# Patient Record
Sex: Male | Born: 1955 | Race: White | Hispanic: No | Smoking: Former smoker
Health system: Southern US, Community
[De-identification: ages and names within clinical notes are randomized; demographics above are authoritative.]

## PROBLEM LIST (undated history)

## (undated) DIAGNOSIS — I10 Essential (primary) hypertension: Secondary | ICD-10-CM

## (undated) DIAGNOSIS — C8290 Follicular lymphoma, unspecified, unspecified site: Secondary | ICD-10-CM

## (undated) DIAGNOSIS — C801 Malignant (primary) neoplasm, unspecified: Secondary | ICD-10-CM

## (undated) DIAGNOSIS — F32A Depression, unspecified: Secondary | ICD-10-CM

## (undated) DIAGNOSIS — F419 Anxiety disorder, unspecified: Secondary | ICD-10-CM

## (undated) DIAGNOSIS — U071 COVID-19: Secondary | ICD-10-CM

## (undated) DIAGNOSIS — M1990 Unspecified osteoarthritis, unspecified site: Secondary | ICD-10-CM

## (undated) HISTORY — DX: Follicular lymphoma, unspecified, unspecified site: C82.90

## (undated) HISTORY — PX: OTHER SURGICAL HISTORY: SHX169

---

## 2005-02-06 ENCOUNTER — Emergency Department (HOSPITAL_COMMUNITY): Admission: EM | Admit: 2005-02-06 | Discharge: 2005-02-07 | Payer: Self-pay | Admitting: Emergency Medicine

## 2006-03-27 ENCOUNTER — Ambulatory Visit: Payer: Self-pay | Admitting: Family Medicine

## 2006-10-21 ENCOUNTER — Ambulatory Visit: Payer: Self-pay | Admitting: Cardiology

## 2016-09-16 ENCOUNTER — Emergency Department (HOSPITAL_COMMUNITY)
Admission: EM | Admit: 2016-09-16 | Discharge: 2016-09-16 | Disposition: A | Discharge status: Home / Self Care | Payer: BLUE CROSS/BLUE SHIELD | Attending: Emergency Medicine | Admitting: Emergency Medicine

## 2016-09-16 ENCOUNTER — Encounter (HOSPITAL_COMMUNITY): Payer: Self-pay | Admitting: Emergency Medicine

## 2016-09-16 ENCOUNTER — Emergency Department (HOSPITAL_COMMUNITY): Payer: BLUE CROSS/BLUE SHIELD

## 2016-09-16 DIAGNOSIS — K429 Umbilical hernia without obstruction or gangrene: Secondary | ICD-10-CM | POA: Diagnosis not present

## 2016-09-16 DIAGNOSIS — I10 Essential (primary) hypertension: Secondary | ICD-10-CM | POA: Insufficient documentation

## 2016-09-16 DIAGNOSIS — R109 Unspecified abdominal pain: Secondary | ICD-10-CM | POA: Diagnosis present

## 2016-09-16 HISTORY — DX: Essential (primary) hypertension: I10

## 2016-09-16 LAB — BASIC METABOLIC PANEL
Anion gap: 8 (ref 5–15)
BUN: 15 mg/dL (ref 6–20)
CALCIUM: 9 mg/dL (ref 8.9–10.3)
CO2: 25 mmol/L (ref 22–32)
CREATININE: 1.21 mg/dL (ref 0.61–1.24)
Chloride: 103 mmol/L (ref 101–111)
GFR calc Af Amer: >60 mL/min (ref >60)
GFR calc non Af Amer: >60 mL/min (ref >60)
GLUCOSE: 125 mg/dL — AB (ref 65–99)
Potassium: 3.8 mmol/L (ref 3.5–5.1)
Sodium: 136 mmol/L (ref 135–145)

## 2016-09-16 LAB — URINALYSIS, ROUTINE W REFLEX MICROSCOPIC
BILIRUBIN URINE: NEGATIVE
Glucose, UA: NEGATIVE mg/dL
HGB URINE DIPSTICK: NEGATIVE
Leukocytes, UA: NEGATIVE
NITRITE: NEGATIVE
PH: 5 (ref 5.0–8.0)
Protein, ur: 30 mg/dL — AB
Specific Gravity, Urine: >1.03 — ABNORMAL HIGH (ref 1.005–1.030)

## 2016-09-16 LAB — URINE MICROSCOPIC-ADD ON
Squamous Epithelial / LPF: NONE SEEN
WBC UA: NONE SEEN WBC/hpf (ref 0–5)

## 2016-09-16 LAB — CBC WITH DIFFERENTIAL/PLATELET
BASOS PCT: 1 %
Basophils Absolute: 0.1 10*3/uL (ref 0.0–0.1)
EOS ABS: 0.6 10*3/uL (ref 0.0–0.7)
Eosinophils Relative: 6 %
HEMATOCRIT: 44.6 % (ref 39.0–52.0)
Hemoglobin: 14.8 g/dL (ref 13.0–17.0)
LYMPHS ABS: 3.7 10*3/uL (ref 0.7–4.0)
Lymphocytes Relative: 41 %
MCH: 30 pg (ref 26.0–34.0)
MCHC: 33.2 g/dL (ref 30.0–36.0)
MCV: 90.5 fL (ref 78.0–100.0)
MONO ABS: 0.8 10*3/uL (ref 0.1–1.0)
MONOS PCT: 9 %
Neutro Abs: 3.9 10*3/uL (ref 1.7–7.7)
Neutrophils Relative %: 43 %
Platelets: 255 10*3/uL (ref 150–400)
RBC: 4.93 MIL/uL (ref 4.22–5.81)
RDW: 15.4 % (ref 11.5–15.5)
WBC: 8.9 10*3/uL (ref 4.0–10.5)

## 2016-09-16 MED ORDER — ONDANSETRON HCL 4 MG/2ML IJ SOLN
4.0000 mg | Freq: Once | INTRAMUSCULAR | Status: AC
  Administered 2016-09-16: 4 mg via INTRAVENOUS

## 2016-09-16 MED ORDER — SODIUM CHLORIDE 0.9 % IV BOLUS (SEPSIS)
1000.0000 mL | Freq: Once | INTRAVENOUS | Status: AC
  Administered 2016-09-16: 1000 mL via INTRAVENOUS

## 2016-09-16 MED ORDER — ONDANSETRON HCL 4 MG/2ML IJ SOLN
INTRAMUSCULAR | Status: AC
  Filled 2016-09-16: qty 2

## 2016-09-16 MED ORDER — ONDANSETRON HCL 4 MG/2ML IJ SOLN
4.0000 mg | Freq: Once | INTRAMUSCULAR | Status: AC
  Administered 2016-09-16: 4 mg via INTRAVENOUS
  Filled 2016-09-16: qty 2

## 2016-09-16 MED ORDER — HYDROMORPHONE HCL 1 MG/ML IJ SOLN
1.0000 mg | Freq: Once | INTRAMUSCULAR | Status: AC
  Administered 2016-09-16: 1 mg via INTRAVENOUS
  Filled 2016-09-16: qty 1

## 2016-09-16 NOTE — ED Provider Notes (Signed)
[facility] DEPT Provider Note   CSN: [id number] Arrival date & time: 09/16/16  0425     History   Chief Complaint Chief Complaint  Patient presents with  . Abdominal Pain    HPI Joe [name] is a 60 y.o. male.  The history is provided by the patient.  Abdominal Pain   This is a new problem. The current episode started 1 to 2 hours ago. The problem occurs constantly. The problem has been gradually worsening. Pain location: umbilicus. The quality of the pain is sharp. The pain is severe. Associated symptoms include nausea. Pertinent negatives include fever and vomiting. The symptoms are aggravated by certain positions and palpation. Nothing relieves the symptoms.  Patient reports he has had an umbilical hernia for several yrs Tonight he had acute onset of severe pain in that area He has never had this type of pain before No fever/vomiting He had otherwise been well prior to this episode  Past Medical History:  Diagnosis Date  . Hypertension     There are no active problems to display for this patient.   Past Surgical History:  Procedure Laterality Date  . carpel tunnel         Home Medications    Prior to Admission medications   Not on File    Family History No family history on file.  Social History Social History  Substance Use Topics  . Smoking status: Never Smoker  . Smokeless tobacco: Never Used  . Alcohol use Yes     Comment: social drinker     Allergies   Patient has no known allergies.   Review of Systems Review of Systems  Constitutional: Negative for fever.  Gastrointestinal: Positive for abdominal pain and nausea. Negative for vomiting.  All other systems reviewed and are negative.    Physical Exam Updated Vital Signs BP (!) 168/102 (BP Location: Left Arm)   Pulse 93   Temp 98.4 F (36.9 C) (Oral)   Resp 18   Ht 5\' 8"  (1.727 m)   Wt (!) 140.6 kg   SpO2 97%   BMI 47.14 kg/m   Physical Exam  CONSTITUTIONAL: Well  developed/well nourished, uncomfortable appearing HEAD: Normocephalic/atraumatic EYES: EOMI ENMT: Mucous membranes moist NECK: supple no meningeal signs CV: S1/S2 noted, no murmurs/rubs/gallops noted LUNGS: Lungs are clear to auscultation bilaterally, no apparent distress ABDOMEN: soft, umbilical hernia noted, mild erythema noted, no duskiness noted, the hernia is tender to palpation, no rebound or guarding, bowel sounds noted throughout abdomen NEURO: Pt is awake/alert/appropriate, moves all extremitiesx4.  No facial droop.   EXTREMITIES: pulses normal/equal, full ROM SKIN: warm, color normal PSYCH: anxious  ED Treatments / Results  Labs (all labs ordered are listed, but only abnormal results are displayed) Labs Reviewed  BASIC METABOLIC PANEL - Abnormal; Notable for the following:       Result Value   Glucose, Bld 125 (*)    All other components within normal limits  URINALYSIS, ROUTINE W REFLEX MICROSCOPIC (NOT AT [facility]) - Abnormal; Notable for the following:    Specific Gravity, Urine >1.030 (*)    Ketones, ur TRACE (*)    Protein, ur 30 (*)    All other components within normal limits  URINE MICROSCOPIC-ADD ON - Abnormal; Notable for the following:    Bacteria, UA FEW (*)    All other components within normal limits  CBC WITH DIFFERENTIAL/PLATELET    EKG  EKG Interpretation None       Radiology Dg Abdomen Acute  W/chest  Result Date: 09/16/2016 CLINICAL DATA:  60 y/o M; umbilical hernia and lower abdominal pain. EXAM: DG ABDOMEN ACUTE W/ 1V CHEST COMPARISON:  None. FINDINGS: Low lung volumes accentuate pulmonary markings. No focal consolidation. No pneumothorax or effusion. Normal cardiac silhouette. No pneumoperitoneum. Normal bowel gas pattern. Small volume of stool in the colon. IMPRESSION: Negative abdominal radiographs.  No acute cardiopulmonary disease. Electronically Signed   By: Kristine Garbe M.D.   On: 09/16/2016 06:29    Procedures Procedures  (including critical care time)  Medications Ordered in ED Medications  HYDROmorphone (DILAUDID) injection 1 mg (1 mg Intravenous Given 09/16/16 0510)  ondansetron (ZOFRAN) injection 4 mg (4 mg Intravenous Given 09/16/16 0509)  sodium chloride 0.9 % bolus 1,000 mL (1,000 mLs Intravenous New Bag/Given 09/16/16 0508)  ondansetron (ZOFRAN) injection 4 mg (4 mg Intravenous Given 09/16/16 0651)     Initial Impression / Assessment and Plan / ED Course  I have reviewed the triage vital signs and the nursing notes.  Pertinent labs & imaging results that were available during my care of the patient were reviewed by me and considered in my medical decision making (see chart for details).  Clinical Course     4:52 AM Will treat pain, obtain xray and reassess  8:03 AM Pt feels much improved He has no abdominal tenderness He feels his hernia is at baseline I did attempt to reduce the hernia with some success and no focal tenderness, no significant skin color changes He has some mild nausea but no other symptoms He feels comfortable with d/c home I feel this is reasonable as imaging negative, labs reassuring Given referral to surgery We discussed strict return precautions  Final Clinical Impressions(s) / ED Diagnoses   Final diagnoses:  Umbilical hernia without obstruction and without gangrene    New Prescriptions New Prescriptions   No medications on file     Ripley Fraise, MD 09/16/16 [phone]

## 2016-09-16 NOTE — ED Notes (Signed)
Pt tolerating sips of po fluids. Reports he feels better and wants to go home.

## 2016-09-16 NOTE — ED Triage Notes (Signed)
Pt with umbilical hernia that c/o lower abdominal pain.

## 2016-09-16 NOTE — ED Notes (Signed)
Upon RN entering room to d/c pt, pt vomiting. EDP at bedside. Orders given.

## 2016-09-16 NOTE — ED Notes (Signed)
Dr Christy Gentles in to reassess

## 2016-09-16 NOTE — ED Notes (Signed)
Dr. Christy Gentles at bedside-if pt tolerates po okay to go home. If any n/v or pain RN to notify Dr. Reather Converse.

## 2016-10-31 DIAGNOSIS — K429 Umbilical hernia without obstruction or gangrene: Secondary | ICD-10-CM | POA: Diagnosis not present

## 2016-12-31 DIAGNOSIS — M109 Gout, unspecified: Secondary | ICD-10-CM | POA: Diagnosis not present

## 2016-12-31 DIAGNOSIS — Z6841 Body Mass Index (BMI) 40.0 and over, adult: Secondary | ICD-10-CM | POA: Diagnosis not present

## 2016-12-31 DIAGNOSIS — I10 Essential (primary) hypertension: Secondary | ICD-10-CM | POA: Diagnosis not present

## 2016-12-31 DIAGNOSIS — F331 Major depressive disorder, recurrent, moderate: Secondary | ICD-10-CM | POA: Diagnosis not present

## 2017-01-14 DIAGNOSIS — I10 Essential (primary) hypertension: Secondary | ICD-10-CM | POA: Diagnosis not present

## 2017-01-23 DIAGNOSIS — I10 Essential (primary) hypertension: Secondary | ICD-10-CM | POA: Diagnosis not present

## 2017-01-23 DIAGNOSIS — M109 Gout, unspecified: Secondary | ICD-10-CM | POA: Diagnosis not present

## 2017-01-23 DIAGNOSIS — E782 Mixed hyperlipidemia: Secondary | ICD-10-CM | POA: Diagnosis not present

## 2017-02-19 DIAGNOSIS — I10 Essential (primary) hypertension: Secondary | ICD-10-CM | POA: Diagnosis not present

## 2017-02-19 DIAGNOSIS — R531 Weakness: Secondary | ICD-10-CM | POA: Diagnosis not present

## 2017-02-19 DIAGNOSIS — R5383 Other fatigue: Secondary | ICD-10-CM | POA: Diagnosis not present

## 2017-03-11 DIAGNOSIS — I10 Essential (primary) hypertension: Secondary | ICD-10-CM | POA: Diagnosis not present

## 2017-04-07 DIAGNOSIS — I10 Essential (primary) hypertension: Secondary | ICD-10-CM | POA: Diagnosis not present

## 2017-04-07 DIAGNOSIS — Z6841 Body Mass Index (BMI) 40.0 and over, adult: Secondary | ICD-10-CM | POA: Diagnosis not present

## 2017-06-17 DIAGNOSIS — I10 Essential (primary) hypertension: Secondary | ICD-10-CM | POA: Diagnosis not present

## 2017-06-17 DIAGNOSIS — M109 Gout, unspecified: Secondary | ICD-10-CM | POA: Diagnosis not present

## 2017-06-17 DIAGNOSIS — F5101 Primary insomnia: Secondary | ICD-10-CM | POA: Diagnosis not present

## 2017-08-01 DIAGNOSIS — E782 Mixed hyperlipidemia: Secondary | ICD-10-CM | POA: Diagnosis not present

## 2017-08-01 DIAGNOSIS — I10 Essential (primary) hypertension: Secondary | ICD-10-CM | POA: Diagnosis not present

## 2017-08-12 DIAGNOSIS — R7301 Impaired fasting glucose: Secondary | ICD-10-CM | POA: Diagnosis not present

## 2017-09-09 DIAGNOSIS — D509 Iron deficiency anemia, unspecified: Secondary | ICD-10-CM | POA: Diagnosis not present

## 2017-09-09 DIAGNOSIS — R7301 Impaired fasting glucose: Secondary | ICD-10-CM | POA: Diagnosis not present

## 2017-10-18 DIAGNOSIS — H532 Diplopia: Secondary | ICD-10-CM | POA: Diagnosis not present

## 2018-03-20 DIAGNOSIS — Z87891 Personal history of nicotine dependence: Secondary | ICD-10-CM | POA: Diagnosis not present

## 2018-03-20 DIAGNOSIS — R05 Cough: Secondary | ICD-10-CM | POA: Diagnosis not present

## 2018-03-20 DIAGNOSIS — J45909 Unspecified asthma, uncomplicated: Secondary | ICD-10-CM | POA: Diagnosis not present

## 2018-03-20 DIAGNOSIS — H6691 Otitis media, unspecified, right ear: Secondary | ICD-10-CM | POA: Diagnosis not present

## 2018-03-20 DIAGNOSIS — Z79899 Other long term (current) drug therapy: Secondary | ICD-10-CM | POA: Diagnosis not present

## 2019-03-12 DIAGNOSIS — R7301 Impaired fasting glucose: Secondary | ICD-10-CM | POA: Diagnosis not present

## 2019-03-12 DIAGNOSIS — I10 Essential (primary) hypertension: Secondary | ICD-10-CM | POA: Diagnosis not present

## 2019-03-12 DIAGNOSIS — D509 Iron deficiency anemia, unspecified: Secondary | ICD-10-CM | POA: Diagnosis not present

## 2019-03-18 DIAGNOSIS — R7301 Impaired fasting glucose: Secondary | ICD-10-CM | POA: Diagnosis not present

## 2019-03-18 DIAGNOSIS — D649 Anemia, unspecified: Secondary | ICD-10-CM | POA: Diagnosis not present

## 2019-03-18 DIAGNOSIS — I10 Essential (primary) hypertension: Secondary | ICD-10-CM | POA: Diagnosis not present

## 2019-03-18 DIAGNOSIS — E785 Hyperlipidemia, unspecified: Secondary | ICD-10-CM | POA: Diagnosis not present

## 2019-04-19 DIAGNOSIS — F411 Generalized anxiety disorder: Secondary | ICD-10-CM | POA: Diagnosis not present

## 2019-04-19 DIAGNOSIS — F5101 Primary insomnia: Secondary | ICD-10-CM | POA: Diagnosis not present

## 2019-04-19 DIAGNOSIS — F331 Major depressive disorder, recurrent, moderate: Secondary | ICD-10-CM | POA: Diagnosis not present

## 2019-04-19 DIAGNOSIS — I10 Essential (primary) hypertension: Secondary | ICD-10-CM | POA: Diagnosis not present

## 2019-05-17 DIAGNOSIS — R05 Cough: Secondary | ICD-10-CM | POA: Diagnosis not present

## 2019-05-17 DIAGNOSIS — J40 Bronchitis, not specified as acute or chronic: Secondary | ICD-10-CM | POA: Diagnosis not present

## 2019-05-18 DIAGNOSIS — J069 Acute upper respiratory infection, unspecified: Secondary | ICD-10-CM | POA: Diagnosis not present

## 2019-05-31 DIAGNOSIS — I10 Essential (primary) hypertension: Secondary | ICD-10-CM | POA: Diagnosis not present

## 2019-05-31 DIAGNOSIS — F5101 Primary insomnia: Secondary | ICD-10-CM | POA: Diagnosis not present

## 2019-05-31 DIAGNOSIS — F411 Generalized anxiety disorder: Secondary | ICD-10-CM | POA: Diagnosis not present

## 2019-05-31 DIAGNOSIS — F331 Major depressive disorder, recurrent, moderate: Secondary | ICD-10-CM | POA: Diagnosis not present

## 2019-06-08 ENCOUNTER — Emergency Department (HOSPITAL_COMMUNITY)
Admission: EM | Admit: 2019-06-08 | Discharge: 2019-06-08 | Disposition: A | Discharge status: Home / Self Care | Payer: BC Managed Care – PPO | Attending: Emergency Medicine | Admitting: Emergency Medicine

## 2019-06-08 ENCOUNTER — Encounter (HOSPITAL_COMMUNITY): Payer: Self-pay

## 2019-06-08 ENCOUNTER — Emergency Department (HOSPITAL_COMMUNITY): Payer: BC Managed Care – PPO

## 2019-06-08 ENCOUNTER — Other Ambulatory Visit: Payer: Self-pay

## 2019-06-08 DIAGNOSIS — Z79899 Other long term (current) drug therapy: Secondary | ICD-10-CM | POA: Insufficient documentation

## 2019-06-08 DIAGNOSIS — I10 Essential (primary) hypertension: Secondary | ICD-10-CM | POA: Insufficient documentation

## 2019-06-08 DIAGNOSIS — R0602 Shortness of breath: Secondary | ICD-10-CM | POA: Diagnosis not present

## 2019-06-08 DIAGNOSIS — J069 Acute upper respiratory infection, unspecified: Secondary | ICD-10-CM | POA: Diagnosis not present

## 2019-06-08 DIAGNOSIS — Z20828 Contact with and (suspected) exposure to other viral communicable diseases: Secondary | ICD-10-CM | POA: Insufficient documentation

## 2019-06-08 DIAGNOSIS — E860 Dehydration: Secondary | ICD-10-CM | POA: Diagnosis not present

## 2019-06-08 DIAGNOSIS — R531 Weakness: Secondary | ICD-10-CM | POA: Diagnosis not present

## 2019-06-08 DIAGNOSIS — R000 Tachycardia, unspecified: Secondary | ICD-10-CM | POA: Diagnosis not present

## 2019-06-08 LAB — CBC
HCT: 42.9 % (ref 39.0–52.0)
Hemoglobin: 14.1 g/dL (ref 13.0–17.0)
MCH: 30.3 pg (ref 26.0–34.0)
MCHC: 32.9 g/dL (ref 30.0–36.0)
MCV: 92.1 fL (ref 80.0–100.0)
Platelets: 292 10*3/uL (ref 150–400)
RBC: 4.66 MIL/uL (ref 4.22–5.81)
RDW: 15.1 % (ref 11.5–15.5)
WBC: 9.7 10*3/uL (ref 4.0–10.5)
nRBC: 0 % (ref 0.0–0.2)

## 2019-06-08 LAB — BASIC METABOLIC PANEL
Anion gap: 10 (ref 5–15)
BUN: 20 mg/dL (ref 6–20)
CO2: 26 mmol/L (ref 22–32)
Calcium: 9 mg/dL (ref 8.9–10.3)
Chloride: 101 mmol/L (ref 98–111)
Creatinine, Ser: 1.58 mg/dL — ABNORMAL HIGH (ref 0.61–1.24)
GFR calc Af Amer: 54 mL/min — ABNORMAL LOW (ref >60)
GFR calc non Af Amer: 47 mL/min — ABNORMAL LOW (ref >60)
Glucose, Bld: 109 mg/dL — ABNORMAL HIGH (ref 70–99)
Potassium: 4.2 mmol/L (ref 3.5–5.1)
Sodium: 137 mmol/L (ref 135–145)

## 2019-06-08 LAB — D-DIMER, QUANTITATIVE: D-Dimer, Quant: 0.51 ug/mL-FEU — ABNORMAL HIGH (ref 0.00–0.50)

## 2019-06-08 MED ORDER — SODIUM CHLORIDE 0.9 % IV BOLUS
500.0000 mL | Freq: Once | INTRAVENOUS | Status: AC
  Administered 2019-06-08: 500 mL via INTRAVENOUS

## 2019-06-08 MED ORDER — PREDNISONE 20 MG PO TABS: 40.0000 mg | ORAL_TABLET | Freq: Every day | ORAL | 0 refills | Status: DC

## 2019-06-08 NOTE — ED Provider Notes (Signed)
[facility] EMERGENCY DEPARTMENT Provider Note   CSN: [id number] Arrival date & time: 06/08/19  0867    History   Chief Complaint Chief Complaint  Patient presents with  . Shortness of Breath    HPI Joe [name] is a 63 y.o. male.     HPI Patient presents with shortness of breath and cough.  Has had for last 3 weeks.  Has been seen previously and had a negative COVID test.  Had been on azithromycin and steroids.  States he felt better briefly but now feeling worse.  More fatigued.  More lightheaded.  No chest pain.  Initially with sputum production but states he no longer has.  Former smoker but states he quit 32 years ago.  No chest pain.  No swelling in his legs. Past Medical History:  Diagnosis Date  . Hypertension     There are no active problems to display for this patient.   Past Surgical History:  Procedure Laterality Date  . carpel tunnel          Home Medications    Prior to Admission medications   Medication Sig Start Date End Date Taking? Authorizing Provider  albuterol (VENTOLIN HFA) 108 (90 Base) MCG/ACT inhaler Inhale 2 puffs into the lungs every 6 (six) hours as needed for wheezing or shortness of breath.  05/17/19 05/16/20 Yes [provider]  buPROPion (WELLBUTRIN XL) 150 MG 24 hr tablet Take 150 mg by mouth 2 (two) times daily.   Yes [provider]  LORazepam (ATIVAN) 0.5 MG tablet Take 0.5 mg by mouth daily as needed for anxiety or sleep.   Yes [provider]  naproxen sodium (ALEVE) 220 MG tablet Take 440 mg by mouth daily as needed (pain).   Yes [provider]  olmesartan-hydrochlorothiazide (BENICAR HCT) 40-12.5 MG tablet Take 1 tablet by mouth daily.   Yes [provider]  sertraline (ZOLOFT) 50 MG tablet Take 50 mg by mouth daily.   Yes [provider]  predniSONE (DELTASONE) 20 MG tablet Take 2 tablets (40 mg total) by mouth daily. 06/08/19   Davonna Belling, MD    Family History No  family history on file.  Social History Social History   Tobacco Use  . Smoking status: Never Smoker  . Smokeless tobacco: Never Used  Substance Use Topics  . Alcohol use: Yes    Comment: social drinker  . Drug use: No     Allergies   Patient has no known allergies.   Review of Systems Review of Systems  Constitutional: Negative for appetite change.  HENT: Negative for congestion.   Respiratory: Positive for cough and shortness of breath. Negative for wheezing.   Cardiovascular: Negative for chest pain.  Gastrointestinal: Negative for abdominal pain.  Genitourinary: Negative for flank pain.  Musculoskeletal: Negative for back pain.  Skin: Negative for rash.  Neurological: Positive for light-headedness. Negative for weakness.  Psychiatric/Behavioral: Negative for confusion.     Physical Exam Updated Vital Signs BP 107/67   Pulse 90   Temp 98.4 F (36.9 C)   Resp 20   Ht 5\' 8"  (1.727 m)   Wt (!) 155.1 kg   SpO2 97%   BMI 52.00 kg/m   Physical Exam Vitals signs and nursing note reviewed.  HENT:     Head: Normocephalic.  Neck:     Musculoskeletal: Neck supple.  Cardiovascular:     Rate and Rhythm: Tachycardia present.  Pulmonary:     Breath sounds: No decreased breath  sounds, wheezing or rales.  Chest:     Chest wall: No tenderness.  Musculoskeletal:     Right lower leg: No edema.     Left lower leg: No edema.  Skin:    General: Skin is warm.     Capillary Refill: Capillary refill takes less than 2 seconds.  Neurological:     Mental Status: He is alert and oriented to person, place, and time.      ED Treatments / Results  Labs (all labs ordered are listed, but only abnormal results are displayed) Labs Reviewed  BASIC METABOLIC PANEL - Abnormal; Notable for the following components:      Result Value   Glucose, Bld 109 (*)    Creatinine, Ser 1.58 (*)    GFR calc non Af Amer 47 (*)    GFR calc Af Amer 54 (*)    All other components within  normal limits  D-DIMER, QUANTITATIVE (NOT AT [facility]) - Abnormal; Notable for the following components:   D-Dimer, Quant 0.51 (*)    All other components within normal limits  NOVEL CORONAVIRUS, NAA (HOSPITAL ORDER, SEND-OUT TO REF LAB)  CBC    EKG EKG Interpretation  Date/Time:  Tuesday June 08 2019 10:50:49 EDT Ventricular Rate:  97 PR Interval:    QRS Duration: 91 QT Interval:  325 QTC Calculation: 413 R Axis:   -93 Text Interpretation:  Sinus rhythm Left anterior fascicular block Consider right ventricular hypertrophy Confirmed by Davonna Belling [phone]) on 06/08/2019 11:31:09 AM   Radiology Dg Chest Portable 1 View  Result Date: 06/08/2019 CLINICAL DATA:  Shortness of breath EXAM: PORTABLE CHEST 1 VIEW COMPARISON:  03/20/2018 FINDINGS: Cardiac shadow is stable. The lungs are clear bilaterally. The lateral most aspect of the right lung is coned off the film. No bony abnormality is noted. IMPRESSION: Somewhat limited exam as described.  No acute abnormality noted. Electronically Signed   By: Inez Catalina M.D.   On: 06/08/2019 10:41    Procedures Procedures (including critical care time)  Medications Ordered in ED Medications  sodium chloride 0.9 % bolus 500 mL (0 mLs Intravenous Stopped 06/08/19 1145)     Initial Impression / Assessment and Plan / ED Course  I have reviewed the triage vital signs and the nursing notes.  Pertinent labs & imaging results that were available during my care of the patient were reviewed by me and considered in my medical decision making (see chart for details).        Patient with URI symptoms.  Feels a lot better after IV fluids.  Will check outpatient covid test.  Has been feeling better with steroids will give another short course.  Creatinine mildly increased.  Will need outpatient follow-up.  Discharge home.  Negative d-dimer.  Final Clinical Impressions(s) / ED Diagnoses   Final diagnoses:  Upper respiratory tract infection,  unspecified type  Dehydration    ED Discharge Orders         Ordered    predniSONE (DELTASONE) 20 MG tablet  Daily     06/08/19 1155           Davonna Belling, MD 06/08/19 1348

## 2019-06-08 NOTE — Discharge Instructions (Addendum)
You have had another COVID test done.  The results should come back in 1 to 2 days.  You will be notified if they are positive we can look at the results in my chart also.

## 2019-06-08 NOTE — ED Triage Notes (Signed)
Pt reports was diagnosed with bronchitis a few weeks ago.  Today pt went to pcp because for the past 2 weeks has had fatigue and sob.  Denies fever.

## 2019-06-09 LAB — NOVEL CORONAVIRUS, NAA (HOSP ORDER, SEND-OUT TO REF LAB; TAT 18-24 HRS): SARS-CoV-2, NAA: NOT DETECTED

## 2020-02-04 DIAGNOSIS — M545 Low back pain: Secondary | ICD-10-CM | POA: Diagnosis not present

## 2020-03-14 ENCOUNTER — Ambulatory Visit (HOSPITAL_COMMUNITY)
Admission: RE | Admit: 2020-03-14 | Discharge: 2020-03-14 | Disposition: A | Discharge status: Home / Self Care | Payer: BC Managed Care – PPO | Source: Ambulatory Visit | Attending: Internal Medicine | Admitting: Internal Medicine

## 2020-03-14 ENCOUNTER — Other Ambulatory Visit: Payer: Self-pay

## 2020-03-14 ENCOUNTER — Other Ambulatory Visit (HOSPITAL_COMMUNITY): Payer: Self-pay | Admitting: Internal Medicine

## 2020-03-14 DIAGNOSIS — W19XXXA Unspecified fall, initial encounter: Secondary | ICD-10-CM

## 2020-03-14 DIAGNOSIS — R0782 Intercostal pain: Secondary | ICD-10-CM | POA: Diagnosis not present

## 2020-03-14 DIAGNOSIS — R0781 Pleurodynia: Secondary | ICD-10-CM

## 2020-03-14 DIAGNOSIS — S2241XA Multiple fractures of ribs, right side, initial encounter for closed fracture: Secondary | ICD-10-CM | POA: Insufficient documentation

## 2020-03-14 DIAGNOSIS — R0789 Other chest pain: Secondary | ICD-10-CM | POA: Diagnosis not present

## 2020-05-09 DIAGNOSIS — M25561 Pain in right knee: Secondary | ICD-10-CM | POA: Diagnosis not present

## 2020-05-23 ENCOUNTER — Other Ambulatory Visit: Payer: Self-pay

## 2020-05-23 ENCOUNTER — Ambulatory Visit: Payer: BC Managed Care – PPO | Admitting: Orthopaedic Surgery

## 2020-05-23 ENCOUNTER — Encounter: Payer: Self-pay | Admitting: Orthopaedic Surgery

## 2020-05-23 ENCOUNTER — Ambulatory Visit: Payer: BC Managed Care – PPO

## 2020-05-23 VITALS — BP 165/107 | HR 115 | Ht 69.0 in | Wt 336.0 lb

## 2020-05-23 DIAGNOSIS — M25561 Pain in right knee: Secondary | ICD-10-CM

## 2020-05-23 DIAGNOSIS — M1A0610 Idiopathic chronic gout, right knee, without tophus (tophi): Secondary | ICD-10-CM | POA: Diagnosis not present

## 2020-05-23 DIAGNOSIS — Z6841 Body Mass Index (BMI) 40.0 and over, adult: Secondary | ICD-10-CM | POA: Diagnosis not present

## 2020-05-23 DIAGNOSIS — G8929 Other chronic pain: Secondary | ICD-10-CM

## 2020-05-23 MED ORDER — ALLOPURINOL 300 MG PO TABS
300.0000 mg | ORAL_TABLET | Freq: Every day | ORAL | 5 refills | Status: DC
Start: 2020-05-23 — End: 2021-01-15

## 2020-05-23 MED ORDER — NAPROXEN 500 MG PO TABS: 500.0000 mg | ORAL_TABLET | Freq: Two times a day (BID) | ORAL | 5 refills | Status: DC

## 2020-05-23 NOTE — Progress Notes (Signed)
Subjective:    Patient ID: Joe [name], male    DOB: 18-Mar-1956, 64 y.o.   MRN: [id number]  HPI He has had pain in the knees, more on the right, for many months getting worse recently.  He has been seen by Dr. Wende Neighbors on 05-12-2020.  I have reviewed the notes.  He was given prednisone dose pack and it helped for a week.  His pain has returned.  He has history of gout and is not on any medicine for that.  He has swelling of the right knee, giving way, popping but no redness, no trauma, no numbness.  He has tried ice, heat and Tylenol also.   Review of Systems  Constitutional: Positive for activity change.  Musculoskeletal: Positive for arthralgias, gait problem and joint swelling.  All other systems reviewed and are negative.  For Review of Systems, all other systems reviewed and are negative.  The following is a summary of the past history medically, past history surgically, known current medicines, social history and family history.  This information is gathered electronically by the computer from prior information and documentation.  I review this each visit and have found including this information at this point in the chart is beneficial and informative.   Past Medical History:  Diagnosis Date  . Hypertension     Past Surgical History:  Procedure Laterality Date  . carpel tunnel      Current Outpatient Medications on File Prior to Visit  Medication Sig Dispense Refill  . amLODipine (NORVASC) 10 MG tablet Take 10 mg by mouth daily.    [name] buPROPion (WELLBUTRIN XL) 150 MG 24 hr tablet Take 150 mg by mouth 2 (two) times daily.    [name] LORazepam (ATIVAN) 0.5 MG tablet Take 0.5 mg by mouth daily as needed for anxiety or sleep.    [name] losartan (COZAAR) 100 MG tablet Take 100 mg by mouth daily.    . naproxen sodium (ALEVE) 220 MG tablet Take 440 mg by mouth daily as needed (pain).    [name] olmesartan-hydrochlorothiazide (BENICAR HCT) 40-12.5 MG tablet Take 1 tablet by mouth daily.      . predniSONE (DELTASONE) 20 MG tablet Take 2 tablets (40 mg total) by mouth daily. 8 tablet 0  . sertraline (ZOLOFT) 50 MG tablet Take 50 mg by mouth daily.    [name] albuterol (VENTOLIN HFA) 108 (90 Base) MCG/ACT inhaler Inhale 2 puffs into the lungs every 6 (six) hours as needed for wheezing or shortness of breath.      No current facility-administered medications on file prior to visit.    Social History   Socioeconomic History  . Marital status: Widowed    Spouse name: Not on file  . Number of children: Not on file  . Years of education: Not on file  . Highest education level: Not on file  Occupational History  . Not on file  Tobacco Use  . Smoking status: Never Smoker  . Smokeless tobacco: Never Used  Substance and Sexual Activity  . Alcohol use: Yes    Comment: social drinker  . Drug use: No  . Sexual activity: Not on file  Other Topics Concern  . Not on file  Social History Narrative  . Not on file   Social Determinants of Health   Financial Resource Strain:   . Difficulty of Paying Living Expenses:   Food Insecurity:   . Worried About Charity fundraiser in the Last Year:   . Ran  Out of Food in the Last Year:   Transportation Needs:   . Lack of Transportation (Medical):   [name] Lack of Transportation (Non-Medical):   Physical Activity:   . Days of Exercise per Week:   . Minutes of Exercise per Session:   Stress:   . Feeling of Stress :   Social Connections:   . Frequency of Communication with Friends and Family:   . Frequency of Social Gatherings with Friends and Family:   . Attends Religious Services:   . Active Member of Clubs or Organizations:   . Attends Archivist Meetings:   [name] Marital Status:   Intimate Partner Violence:   . Fear of Current or Ex-Partner:   . Emotionally Abused:   [name] Physically Abused:   . Sexually Abused:     Family History  Problem Relation Age of Onset  . Healthy Mother   . Healthy Father     BP (!) 165/107   Pulse  (!) 115   Ht 5\' 9"  (1.753 m)   Wt (!) 336 lb (152.4 kg)   BMI 49.62 kg/m   Body mass index is 49.62 kg/m.      Objective:   Physical Exam Vitals and nursing note reviewed.  Constitutional:      Appearance: He is well-developed.  HENT:     Head: Normocephalic and atraumatic.  Eyes:     Conjunctiva/sclera: Conjunctivae normal.     Pupils: Pupils are equal, round, and reactive to light.  Cardiovascular:     Rate and Rhythm: Normal rate and regular rhythm.  Pulmonary:     Effort: Pulmonary effort is normal.  Abdominal:     Palpations: Abdomen is soft.  Musculoskeletal:     Cervical back: Normal range of motion and neck supple.       Legs:  Skin:    General: Skin is warm and dry.  Neurological:     Mental Status: He is alert and oriented to person, place, and time.     Cranial Nerves: No cranial nerve deficit.     Motor: No abnormal muscle tone.     Coordination: Coordination normal.     Deep Tendon Reflexes: Reflexes are normal and symmetric. Reflexes normal.  Psychiatric:        Behavior: Behavior normal.        Thought Content: Thought content normal.        Judgment: Judgment normal.    X-rays were done of the right knee, reported separately.       Assessment & Plan:   Encounter Diagnoses  Name Primary?  . Chronic pain of right knee Yes  . Idiopathic chronic gout of right knee without tophus   . Body mass index 45.0-49.9, adult ([place])   . Morbid obesity ([place])    PROCEDURE NOTE:  The patient requests injections of the right knee , verbal consent was obtained.  The right knee was prepped appropriately after time out was performed.   Sterile technique was observed and injection of 1 cc of Depo-Medrol 40 mg with several cc's of plain xylocaine. Anesthesia was provided by ethyl chloride and a 20-gauge needle was used to inject the knee area. The injection was tolerated well.  A band aid dressing was applied.  The patient was advised to apply ice later today  and tomorrow to the injection sight as needed.  I am concerned about medial meniscus tear.  I have given exercises for him to do.  I will see him back  in two weeks.  He may need MRI.  Call if any problem.  Precautions discussed.   Electronically Signed Sanjuana Kava, MD 8/3/202111:05 AM

## 2020-05-23 NOTE — Patient Instructions (Signed)
Journal for Nurse Practitioners, 15(4), 263-267. Retrieved July 27, 2018 from [email or web address].[email or web address]">  Knee Exercises Ask your health care provider which exercises are safe for you. Do exercises exactly as told by your health care provider and adjust them as directed. It is normal to feel mild stretching, pulling, tightness, or discomfort as you do these exercises. Stop right away if you feel sudden pain or your pain gets worse. Do not begin these exercises until told by your health care provider. Stretching and range-of-motion exercises These exercises warm up your muscles and joints and improve the movement and flexibility of your knee. These exercises also help to relieve pain and swelling. Knee extension, prone 1. Lie on your abdomen (prone position) on a bed. 2. Place your left / right knee just beyond the edge of the surface so your knee is not on the bed. You can put a towel under your left / right thigh just above your kneecap for comfort. 3. Relax your leg muscles and allow gravity to straighten your knee (extension). You should feel a stretch behind your left / right knee. 4. Hold this position for __________ seconds. 5. Scoot up so your knee is supported between repetitions. Repeat __________ times. Complete this exercise __________ times a day. Knee flexion, active  1. Lie on your back with both legs straight. If this causes back discomfort, bend your left / right knee so your foot is flat on the floor. 2. Slowly slide your left / right heel back toward your buttocks. Stop when you feel a gentle stretch in the front of your knee or thigh (flexion). 3. Hold this position for __________ seconds. 4. Slowly slide your left / right heel back to the starting position. Repeat __________ times. Complete this exercise __________ times a day. Quadriceps stretch, prone  1. Lie on your abdomen on a firm surface, such as a bed or padded floor. 2. Bend your left / right knee and hold  your ankle. If you cannot reach your ankle or pant leg, loop a belt around your foot and grab the belt instead. 3. Gently pull your heel toward your buttocks. Your knee should not slide out to the side. You should feel a stretch in the front of your thigh and knee (quadriceps). 4. Hold this position for __________ seconds. Repeat __________ times. Complete this exercise __________ times a day. Hamstring, supine 1. Lie on your back (supine position). 2. Loop a belt or towel over the ball of your left / right foot. The ball of your foot is on the walking surface, right under your toes. 3. Straighten your left / right knee and slowly pull on the belt to raise your leg until you feel a gentle stretch behind your knee (hamstring). ? Do not let your knee bend while you do this. ? Keep your other leg flat on the floor. 4. Hold this position for __________ seconds. Repeat __________ times. Complete this exercise __________ times a day. Strengthening exercises These exercises build strength and endurance in your knee. Endurance is the ability to use your muscles for a long time, even after they get tired. Quadriceps, isometric This exercise stretches the muscles in front of your thigh (quadriceps) without moving your knee joint (isometric). 1. Lie on your back with your left / right leg extended and your other knee bent. Put a rolled towel or small pillow under your knee if told by your health care provider. 2. Slowly tense the muscles in the front of your left /   right thigh. You should see your kneecap slide up toward your hip or see increased dimpling just above the knee. This motion will push the back of the knee toward the floor. 3. For __________ seconds, hold the muscle as tight as you can without increasing your pain. 4. Relax the muscles slowly and completely. Repeat __________ times. Complete this exercise __________ times a day. Straight leg raises This exercise stretches the muscles in front  of your thigh (quadriceps) and the muscles that move your hips (hip flexors). 1. Lie on your back with your left / right leg extended and your other knee bent. 2. Tense the muscles in the front of your left / right thigh. You should see your kneecap slide up or see increased dimpling just above the knee. Your thigh may even shake a bit. 3. Keep these muscles tight as you raise your leg 4-6 inches (10-15 cm) off the floor. Do not let your knee bend. 4. Hold this position for __________ seconds. 5. Keep these muscles tense as you lower your leg. 6. Relax your muscles slowly and completely after each repetition. Repeat __________ times. Complete this exercise __________ times a day. Hamstring, isometric 1. Lie on your back on a firm surface. 2. Bend your left / right knee about __________ degrees. 3. Dig your left / right heel into the surface as if you are trying to pull it toward your buttocks. Tighten the muscles in the back of your thighs (hamstring) to "dig" as hard as you can without increasing any pain. 4. Hold this position for __________ seconds. 5. Release the tension gradually and allow your muscles to relax completely for __________ seconds after each repetition. Repeat __________ times. Complete this exercise __________ times a day. Hamstring curls If told by your health care provider, do this exercise while wearing ankle weights. Begin with __________ lb weights. Then increase the weight by 1 lb (0.5 kg) increments. Do not wear ankle weights that are more than __________ lb. 1. Lie on your abdomen with your legs straight. 2. Bend your left / right knee as far as you can without feeling pain. Keep your hips flat against the floor. 3. Hold this position for __________ seconds. 4. Slowly lower your leg to the starting position. Repeat __________ times. Complete this exercise __________ times a day. Squats This exercise strengthens the muscles in front of your thigh and knee  (quadriceps). 1. Stand in front of a table, with your feet and knees pointing straight ahead. You may rest your hands on the table for balance but not for support. 2. Slowly bend your knees and lower your hips like you are going to sit in a chair. ? Keep your weight over your heels, not over your toes. ? Keep your lower legs upright so they are parallel with the table legs. ? Do not let your hips go lower than your knees. ? Do not bend lower than told by your health care provider. ? If your knee pain increases, do not bend as low. 3. Hold the squat position for __________ seconds. 4. Slowly push with your legs to return to standing. Do not use your hands to pull yourself to standing. Repeat __________ times. Complete this exercise __________ times a day. Wall slides This exercise strengthens the muscles in front of your thigh and knee (quadriceps). 1. Lean your back against a smooth wall or door, and walk your feet out 18-24 inches (46-61 cm) from it. 2. Place your feet hip-width apart. 3.   Slowly slide down the wall or door until your knees bend __________ degrees. Keep your knees over your heels, not over your toes. Keep your knees in line with your hips. 4. Hold this position for __________ seconds. Repeat __________ times. Complete this exercise __________ times a day. Straight leg raises This exercise strengthens the muscles that rotate the leg at the hip and move it away from your body (hip abductors). 1. Lie on your side with your left / right leg in the top position. Lie so your head, shoulder, knee, and hip line up. You may bend your bottom knee to help you keep your balance. 2. Roll your hips slightly forward so your hips are stacked directly over each other and your left / right knee is facing forward. 3. Leading with your heel, lift your top leg 4-6 inches (10-15 cm). You should feel the muscles in your outer hip lifting. ? Do not let your foot drift forward. ? Do not let your knee  roll toward the ceiling. 4. Hold this position for __________ seconds. 5. Slowly return your leg to the starting position. 6. Let your muscles relax completely after each repetition. Repeat __________ times. Complete this exercise __________ times a day. Straight leg raises This exercise stretches the muscles that move your hips away from the front of the pelvis (hip extensors). 1. Lie on your abdomen on a firm surface. You can put a pillow under your hips if that is more comfortable. 2. Tense the muscles in your buttocks and lift your left / right leg about 4-6 inches (10-15 cm). Keep your knee straight as you lift your leg. 3. Hold this position for __________ seconds. 4. Slowly lower your leg to the starting position. 5. Let your leg relax completely after each repetition. Repeat __________ times. Complete this exercise __________ times a day. This information is not intended to replace advice given to you by your health care provider. Make sure you discuss any questions you have with your health care provider. Document Revised: 07/28/2018 Document Reviewed: 07/28/2018 Elsevier Patient Education  2020 [employer]  

## 2020-06-08 ENCOUNTER — Encounter: Payer: Self-pay | Admitting: Orthopaedic Surgery

## 2020-06-08 ENCOUNTER — Other Ambulatory Visit: Payer: Self-pay

## 2020-06-08 ENCOUNTER — Ambulatory Visit: Payer: BC Managed Care – PPO | Admitting: Orthopaedic Surgery

## 2020-06-08 VITALS — BP 175/107 | HR 100 | Ht 69.0 in | Wt 351.0 lb

## 2020-06-08 DIAGNOSIS — Z6841 Body Mass Index (BMI) 40.0 and over, adult: Secondary | ICD-10-CM

## 2020-06-08 DIAGNOSIS — G8929 Other chronic pain: Secondary | ICD-10-CM

## 2020-06-08 DIAGNOSIS — M25561 Pain in right knee: Secondary | ICD-10-CM

## 2020-06-08 NOTE — Patient Instructions (Signed)
Out of work 

## 2020-06-08 NOTE — Progress Notes (Signed)
[name], male DOB:1956/06/19, 64 y.o. LOV:[id number]  Chief Complaint  Patient presents with  . Knee Pain    R/injection helped some    HPI  Joe [name] is a 64 y.o. male who has increasing pain of the right knee.  The injection helped a little.  He is doing the exercises given but they make it worse.  He has medial pain, giving way and positive medial McMurray.  I am concerned he has medial meniscus tear and may need arthroscopy.  The naprosyn has helped slightly.  He is using a cane to avoid falling.  Body mass index is 51.83 kg/m.  The patient meets the AMA guidelines for Morbid (severe) obesity with a BMI > 40.0 and I have recommended weight loss.   ROS  Review of Systems  Constitutional: Positive for activity change.  Musculoskeletal: Positive for arthralgias, gait problem and joint swelling.  All other systems reviewed and are negative.   All other systems reviewed and are negative.  The following is a summary of the past history medically, past history surgically, known current medicines, social history and family history.  This information is gathered electronically by the computer from prior information and documentation.  I review this each visit and have found including this information at this point in the chart is beneficial and informative.    Past Medical History:  Diagnosis Date  . Hypertension     Past Surgical History:  Procedure Laterality Date  . carpel tunnel      Family History  Problem Relation Age of Onset  . Healthy Mother   . Healthy Father     Social History Social History   Tobacco Use  . Smoking status: Never Smoker  . Smokeless tobacco: Never Used  Substance Use Topics  . Alcohol use: Yes    Comment: social drinker  . Drug use: No    No Known Allergies  Current Outpatient Medications  Medication Sig Dispense Refill  . allopurinol (ZYLOPRIM) 300 MG tablet Take 1 tablet (300 mg total) by mouth daily. 30  tablet 5  . amLODipine (NORVASC) 10 MG tablet Take 10 mg by mouth daily.    [name] buPROPion (WELLBUTRIN XL) 150 MG 24 hr tablet Take 150 mg by mouth 2 (two) times daily.    [name] LORazepam (ATIVAN) 0.5 MG tablet Take 0.5 mg by mouth daily as needed for anxiety or sleep.    [name] losartan (COZAAR) 100 MG tablet Take 100 mg by mouth daily.    . naproxen (NAPROSYN) 500 MG tablet Take 1 tablet (500 mg total) by mouth 2 (two) times daily with a meal. 60 tablet 5  . naproxen sodium (ALEVE) 220 MG tablet Take 440 mg by mouth daily as needed (pain).    [name] olmesartan-hydrochlorothiazide (BENICAR HCT) 40-12.5 MG tablet Take 1 tablet by mouth daily.    . predniSONE (DELTASONE) 20 MG tablet Take 2 tablets (40 mg total) by mouth daily. 8 tablet 0  . sertraline (ZOLOFT) 50 MG tablet Take 50 mg by mouth daily.    [name] albuterol (VENTOLIN HFA) 108 (90 Base) MCG/ACT inhaler Inhale 2 puffs into the lungs every 6 (six) hours as needed for wheezing or shortness of breath.      No current facility-administered medications for this visit.     Physical Exam  Blood pressure (!) 175/107, pulse 100, height 5\' 9"  (1.753 m), weight (!) 351 lb (159.2 kg).  Constitutional: overall normal hygiene, normal nutrition, well developed, normal grooming,  normal body habitus. Assistive device:cane  Musculoskeletal: gait and station Limp right, muscle tone and strength are normal, no tremors or atrophy is present.  .  Neurological: coordination overall normal.  Deep tendon reflex/nerve stretch intact.  Sensation normal.  Cranial nerves II-XII intact.   Skin:   Normal overall no scars, lesions, ulcers or rashes. No psoriasis.  Psychiatric: Alert and oriented x 3.  Recent memory intact, remote memory unclear.  Normal mood and affect. Well groomed.  Good eye contact.  Cardiovascular: overall no swelling, no varicosities, no edema bilaterally, normal temperatures of the legs and arms, no clubbing, cyanosis and good capillary  refill.  Lymphatic: palpation is normal.  Right knee painful, ROM 0 to 100, crepitus, effusion, positive medial McMurray, limp right, uses cane, NV intact.   All other systems reviewed and are negative   The patient has been educated about the nature of the problem(s) and counseled on treatment options.  The patient appeared to understand what I have discussed and is in agreement with it.  Encounter Diagnoses  Name Primary?  . Chronic pain of right knee Yes  . Body mass index 45.0-49.9, adult ([place])   . Morbid obesity ([place])     PLAN Call if any problems.  Precautions discussed.  Continue current medications.   Return to clinic 2 weeks   Get MRI of the right knee.  Electronically Signed Sanjuana Kava, MD 8/19/20218:37 AM

## 2020-06-15 ENCOUNTER — Telehealth: Payer: Self-pay | Admitting: Orthopaedic Surgery

## 2020-06-15 NOTE — Telephone Encounter (Signed)
I called the patient to reschedule his appointment for the MRI follow up that was on 06/22/2020 since the MRI is not until 07/08/2020. It has been re-scheduled for a later date past the MRI date.   Patient wants to know if you are willing to extend the note to be out of work until he has the MRI. This is scheduled for 07/08/2020. Please advise.

## 2020-06-19 NOTE — Telephone Encounter (Signed)
Yes, extend out of work note.

## 2020-06-20 ENCOUNTER — Encounter: Payer: Self-pay | Admitting: Orthopaedic Surgery

## 2020-06-20 NOTE — Telephone Encounter (Signed)
I called the patient and he voices understanding. He is going to bring by paperwork as well for Dr. Luna Glasgow. No other concerns.

## 2020-06-22 ENCOUNTER — Ambulatory Visit: Payer: BC Managed Care – PPO | Admitting: Orthopaedic Surgery

## 2020-07-08 ENCOUNTER — Ambulatory Visit
Admission: RE | Admit: 2020-07-08 | Discharge: 2020-07-08 | Disposition: A | Discharge status: Home / Self Care | Payer: BC Managed Care – PPO | Source: Ambulatory Visit | Attending: Orthopaedic Surgery | Admitting: Orthopaedic Surgery

## 2020-07-08 ENCOUNTER — Other Ambulatory Visit: Payer: Self-pay

## 2020-07-08 DIAGNOSIS — M25561 Pain in right knee: Secondary | ICD-10-CM

## 2020-07-08 DIAGNOSIS — G8929 Other chronic pain: Secondary | ICD-10-CM

## 2020-07-11 ENCOUNTER — Other Ambulatory Visit: Payer: Self-pay

## 2020-07-11 ENCOUNTER — Ambulatory Visit: Payer: BC Managed Care – PPO | Admitting: Orthopaedic Surgery

## 2020-07-11 ENCOUNTER — Encounter: Payer: Self-pay | Admitting: Orthopaedic Surgery

## 2020-07-11 DIAGNOSIS — M1A0610 Idiopathic chronic gout, right knee, without tophus (tophi): Secondary | ICD-10-CM | POA: Diagnosis not present

## 2020-07-11 DIAGNOSIS — Z6841 Body Mass Index (BMI) 40.0 and over, adult: Secondary | ICD-10-CM

## 2020-07-11 DIAGNOSIS — M25561 Pain in right knee: Secondary | ICD-10-CM | POA: Diagnosis not present

## 2020-07-11 DIAGNOSIS — G8929 Other chronic pain: Secondary | ICD-10-CM

## 2020-07-11 NOTE — Patient Instructions (Signed)
Out of work 

## 2020-07-11 NOTE — Progress Notes (Signed)
[name], male DOB:1956-08-11, 64 y.o. LFY:[id number]  Chief Complaint  Patient presents with   Knee Pain    chronic right knee pain, MRI review.     HPI  Joe [name] is a 64 y.o. male who has right knee pain with swelling and giving way.  He is not getting any better.  He uses a cane.  He had MRI which showed: IMPRESSION: 1. 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 Radial tear of the body of the medial meniscus. 2. Small undersurface tear of the posterior horn-body junction of the lateral meniscus. 3. Tricompartmental cartilage abnormalities as described above.  I have explained the findings to him.  I recommend arthroscopy of the knee.  I have explained the procedure to him for arthroscopy of the knee.  I have independently reviewed the MRI.       There is no height or weight on file to calculate BMI.  ROS  Review of Systems  Constitutional: Positive for activity change.  Musculoskeletal: Positive for arthralgias, gait problem and joint swelling.  All other systems reviewed and are negative.   All other systems reviewed and are negative.  The following is a summary of the past history medically, past history surgically, known current medicines, social history and family history.  This information is gathered electronically by the computer from prior information and documentation.  I review this each visit and have found including this information at this point in the chart is beneficial and informative.    Past Medical History:  Diagnosis Date   Hypertension     Past Surgical History:  Procedure Laterality Date   carpel tunnel      Family History  Problem Relation Age of Onset   Healthy Mother    Healthy Father     Social History Social History   Tobacco Use   Smoking status: Never Smoker   Smokeless tobacco: Never Used  Substance Use Topics   Alcohol use: Yes    Comment: social drinker   Drug use:  No    No Known Allergies  Current Outpatient Medications  Medication Sig Dispense Refill   allopurinol (ZYLOPRIM) 300 MG tablet Take 1 tablet (300 mg total) by mouth daily. 30 tablet 5   amLODipine (NORVASC) 10 MG tablet Take 10 mg by mouth daily.     buPROPion (WELLBUTRIN XL) 150 MG 24 hr tablet Take 150 mg by mouth 2 (two) times daily.     LORazepam (ATIVAN) 0.5 MG tablet Take 0.5 mg by mouth daily as needed for anxiety or sleep.     losartan (COZAAR) 100 MG tablet Take 100 mg by mouth daily.     naproxen (NAPROSYN) 500 MG tablet Take 1 tablet (500 mg total) by mouth 2 (two) times daily with a meal. 60 tablet 5   naproxen sodium (ALEVE) 220 MG tablet Take 440 mg by mouth daily as needed (pain).     olmesartan-hydrochlorothiazide (BENICAR HCT) 40-12.5 MG tablet Take 1 tablet by mouth daily.     predniSONE (DELTASONE) 20 MG tablet Take 2 tablets (40 mg total) by mouth daily. 8 tablet 0   sertraline (ZOLOFT) 50 MG tablet Take 50 mg by mouth daily.     albuterol (VENTOLIN HFA) 108 (90 Base) MCG/ACT inhaler Inhale 2 puffs into the lungs every 6 (six) hours as needed for wheezing or shortness of breath.      No current facility-administered medications for this visit.     Physical Exam  There were no vitals taken for this visit.  Constitutional: overall normal hygiene, normal nutrition, well developed, normal grooming, normal body habitus. Assistive device:cane  Musculoskeletal: gait and station Limp right, muscle tone and strength are normal, no tremors or atrophy is present.  .  Neurological: coordination overall normal.  Deep tendon reflex/nerve stretch intact.  Sensation normal.  Cranial nerves II-XII intact.   Skin:   Normal overall no scars, lesions, ulcers or rashes. No psoriasis.  Psychiatric: Alert and oriented x 3.  Recent memory intact, remote memory unclear.  Normal mood and affect. Well groomed.  Good eye contact.  Cardiovascular: overall no swelling, no  varicosities, no edema bilaterally, normal temperatures of the legs and arms, no clubbing, cyanosis and good capillary refill.  Lymphatic: palpation is normal.  Right knee is tender, ROM 0 to 100, crepitus, effusion, pain medially, positive medial McMurray, NV intact.  Limp right.  Uses cane.  All other systems reviewed and are negative   The patient has been educated about the nature of the problem(s) and counseled on treatment options.  The patient appeared to understand what I have discussed and is in agreement with it.  Encounter Diagnoses  Name Primary?   Chronic pain of right knee Yes   Body mass index 45.0-49.9, adult ([facility])    Morbid obesity ([facility])    Idiopathic chronic gout of right knee without tophus     PLAN Call if any problems.  Precautions discussed.  Continue current medications.   Return to clinic for arthroscopy of the right knee.  To see Dr. Aline Brochure or Amedeo Kinsman.   Electronically Signed Sanjuana Kava, MD 9/21/20212:33 PM

## 2020-07-12 ENCOUNTER — Encounter: Payer: Self-pay | Admitting: Orthopedic Surgery

## 2020-07-12 ENCOUNTER — Ambulatory Visit: Payer: BC Managed Care – PPO | Admitting: Orthopedic Surgery

## 2020-07-12 VITALS — Ht 69.0 in | Wt 351.0 lb

## 2020-07-12 DIAGNOSIS — Z6841 Body Mass Index (BMI) 40.0 and over, adult: Secondary | ICD-10-CM

## 2020-07-12 DIAGNOSIS — G8929 Other chronic pain: Secondary | ICD-10-CM

## 2020-07-12 DIAGNOSIS — M25561 Pain in right knee: Secondary | ICD-10-CM | POA: Diagnosis not present

## 2020-07-12 NOTE — Patient Instructions (Signed)
1.  We have provided you with a prescription for physical therapy 2. Continue to take medications for gout and arthritis  3.  Recommend you work on losing weight, which will reduce the stress on your knees 4.  Do not think you are a good candidate for surgery, as I think this will worsen the pain in your knee 5.  You can follow up with Dr. Luna Glasgow if you would like another injection 6.  Please contact the office if you have any issues, I am happy to schedule an appointment for a repeat knee injection

## 2020-07-12 NOTE — Progress Notes (Signed)
New Patient Visit  Assessment: Joe [name] is a 64 y.o. male with right knee pain in the setting gouty arthritis, and a small medial meniscus tear confirmed on MRI  Plan: I had an extensive discussion with Mr. Brandow regarding his right knee.  I reviewed the x-rays, as well as the MRI of his right knee.  At this point in time, his biggest complaint is pain.  He denies mechanical symptoms in the right knee.  As a result, I do not think that the pain he is experiencing is directly related to the meniscus tears.  I feel it is important for him to continue taking his gout medication, as well as NSAIDs as prescribed.  He had a steroid injection approximately 6 weeks ago which helped, but is too early to repeat this injection.  In addition, he has not yet participated in physical therapy, and I feel this is important for him to improve his range of motion, and potentially help his overall symptoms.  I told him that the surgery would not help his current symptoms, and I am concerned that surgery would worsen his overall pain and discomfort.    Further to that, his BMI is greater than 50, and is not a candidate for consideration for total knee arthroplasty should his pain persist.  As such, I talked to him with the importance of working to lose some weight and attempt to improve his BMI.  This could also improve his overall symptoms.  If he continues to have pain in his right knee over the next 6 to 8 weeks, he could be a candidate for repeat steroid injection.  He can return to clinic and I am happy to provide this or he can return to Dr. Brooke Bonito clinic to receive another injection.  All his questions were answered he is amenable to plan.    The patient meets the AMA guidelines for Morbid obesity with BMI > 40.  The patient has been counseled on weight loss.    Follow-up: PRN  Subjective:  Chief Complaint  Patient presents with  . Knee Pain    discuss surgery options     History of Present  Illness: Joe [name] is a 64 y.o. male who presents for evaluation of his right knee.  He has been having significant discomfort in the right knee for the last couple of months.  He does have a history of gout, and he had his first right knee flare at the onset of his recent pain.  He has been treated by Dr. Luna Glasgow appropriately, and his symptoms have improved but due to the persistence of pain in his right knee, an MRI was obtained.  The pain is primarily located in the medial aspect of his right knee.  Due to the level of discomfort, he is using a cane, and is unable to return to work.  He had 1 injection approximately 6 weeks ago which did improve his symptoms.  He is also within taking allopurinol, as well as naproxen for his pain.  These have helped with his symptoms.  Is been doing some exercises at home, but has not been doing any physical therapy.  His gout has remained stable since his most recent flare.  He has no catching or locking sensation in his right knee.  Review of Systems: No fevers or chills No chest pain No shortness of breath  Medical History:  Past Medical History:  Diagnosis Date  . Hypertension     Past  Surgical History:  Procedure Laterality Date  . carpel tunnel      Family History  Problem Relation Age of Onset  . Healthy Mother   . Healthy Father    Social History   Tobacco Use  . Smoking status: Never Smoker  . Smokeless tobacco: Never Used  Substance Use Topics  . Alcohol use: Yes    Comment: social drinker  . Drug use: No    No Known Allergies  Current Meds  Medication Sig  . allopurinol (ZYLOPRIM) 300 MG tablet Take 1 tablet (300 mg total) by mouth daily.  [name] amLODipine (NORVASC) 10 MG tablet Take 10 mg by mouth daily.  [name] buPROPion (WELLBUTRIN XL) 150 MG 24 hr tablet Take 150 mg by mouth 2 (two) times daily.  [name] LORazepam (ATIVAN) 0.5 MG tablet Take 0.5 mg by mouth daily as needed for anxiety or sleep.  [name] losartan (COZAAR) 100 MG tablet  Take 100 mg by mouth daily.  . naproxen (NAPROSYN) 500 MG tablet Take 1 tablet (500 mg total) by mouth 2 (two) times daily with a meal.  . naproxen sodium (ALEVE) 220 MG tablet Take 440 mg by mouth daily as needed (pain).  [name] olmesartan-hydrochlorothiazide (BENICAR HCT) 40-12.5 MG tablet Take 1 tablet by mouth daily.  . predniSONE (DELTASONE) 20 MG tablet Take 2 tablets (40 mg total) by mouth daily.  . sertraline (ZOLOFT) 50 MG tablet Take 50 mg by mouth daily.    Objective: Ht 5\' 9"  (1.753 m)   Wt (!) 351 lb (159.2 kg)   BMI 51.83 kg/m   Physical Exam:  Obese male no acute distress No increased work of breathing. Right-sided antalgic gait, uses a cane to assist with ambulation.  Evaluation of the right knee demonstrates a mild effusion.  He does have tenderness to palpation along the medial joint line.  He does have pain with hyperflexion.  Range of motion from 5 to 100 degrees.  Negative Lachman.  No increased instability to varus or valgus stress.  Evaluation of the left knee demonstrates no effusion.  No tenderness to palpation along the medial or lateral joint lines.  Full range of motion.  Negative Lachman.   IMAGING: I personally ordered and reviewed the following images:  X-rays of the right knee were obtained at a previous clinic visit and demonstrates neutral overall alignment.  Mild to moderate degenerative changes, specifically within the medial compartment.  Is difficult to assess the patellofemoral compartment based on available x-rays.  MRI of the right knee was reviewed and this demonstrates a small radial tear of the medial meniscus, as well as a degenerative tear of the posterior horn of the lateral meniscus.  Full-thickness cartilage loss within the patellofemoral compartment.  Partial thickness cartilage loss within the medial lateral compartments.  No underlying bony edema.  No orders of the defined types were placed in this encounter.     Mordecai Rasmussen,  MD  07/12/2020 1:32 PM

## 2020-07-19 DIAGNOSIS — G8929 Other chronic pain: Secondary | ICD-10-CM | POA: Diagnosis not present

## 2020-07-19 DIAGNOSIS — M25561 Pain in right knee: Secondary | ICD-10-CM | POA: Diagnosis not present

## 2020-07-21 DIAGNOSIS — M25561 Pain in right knee: Secondary | ICD-10-CM | POA: Diagnosis not present

## 2020-07-21 DIAGNOSIS — G8929 Other chronic pain: Secondary | ICD-10-CM | POA: Diagnosis not present

## 2020-08-01 ENCOUNTER — Telehealth: Payer: Self-pay | Admitting: Orthopaedic Surgery

## 2020-08-01 NOTE — Telephone Encounter (Signed)
Patient called to relay that he is not returning to physical therapy (at [facility] in [place]) due to financial reasons, and also due to fact that he feels he is progressing fine with the home exercises he is working on. States he is aware of the next appointment with Dr Luna Glasgow, as he already had the surgical consult with Dr Amedeo Kinsman, and will return to see Dr Luna Glasgow.

## 2020-08-02 NOTE — Telephone Encounter (Signed)
OK, noted.  Please let PT know he will not be coming back.  Thanks.

## 2020-08-02 NOTE — Telephone Encounter (Signed)
Done

## 2020-08-17 ENCOUNTER — Encounter: Payer: Self-pay | Admitting: Orthopaedic Surgery

## 2020-08-17 ENCOUNTER — Ambulatory Visit (INDEPENDENT_AMBULATORY_CARE_PROVIDER_SITE_OTHER): Payer: BC Managed Care – PPO | Admitting: Orthopaedic Surgery

## 2020-08-17 ENCOUNTER — Other Ambulatory Visit: Payer: Self-pay

## 2020-08-17 VITALS — Ht 69.0 in | Wt 355.0 lb

## 2020-08-17 DIAGNOSIS — G8929 Other chronic pain: Secondary | ICD-10-CM

## 2020-08-17 DIAGNOSIS — Z6841 Body Mass Index (BMI) 40.0 and over, adult: Secondary | ICD-10-CM

## 2020-08-17 DIAGNOSIS — M25561 Pain in right knee: Secondary | ICD-10-CM | POA: Diagnosis not present

## 2020-08-17 NOTE — Progress Notes (Signed)
PROCEDURE NOTE:  The patient requests injections of the right knee , verbal consent was obtained.  The right knee was prepped appropriately after time out was performed.   Sterile technique was observed and injection of 1 cc of Depo-Medrol 40 mg with several cc's of plain xylocaine. Anesthesia was provided by ethyl chloride and a 20-gauge needle was used to inject the knee area. The injection was tolerated well.  A band aid dressing was applied.  The patient was advised to apply ice later today and tomorrow to the injection sight as needed.  Return in six weeks.  Electronically Signed Sanjuana Kava, MD 10/28/20219:12 AM

## 2020-09-26 ENCOUNTER — Ambulatory Visit: Payer: BC Managed Care – PPO | Admitting: Orthopaedic Surgery

## 2020-11-02 DIAGNOSIS — U071 COVID-19: Secondary | ICD-10-CM | POA: Diagnosis not present

## 2020-11-07 DIAGNOSIS — U071 COVID-19: Secondary | ICD-10-CM | POA: Diagnosis not present

## 2020-11-13 DIAGNOSIS — U071 COVID-19: Secondary | ICD-10-CM | POA: Diagnosis not present

## 2020-11-13 DIAGNOSIS — J069 Acute upper respiratory infection, unspecified: Secondary | ICD-10-CM | POA: Diagnosis not present

## 2020-12-04 DIAGNOSIS — U071 COVID-19: Secondary | ICD-10-CM | POA: Diagnosis not present

## 2020-12-04 DIAGNOSIS — J4521 Mild intermittent asthma with (acute) exacerbation: Secondary | ICD-10-CM | POA: Diagnosis not present

## 2020-12-04 DIAGNOSIS — J069 Acute upper respiratory infection, unspecified: Secondary | ICD-10-CM | POA: Diagnosis not present

## 2020-12-18 DIAGNOSIS — J069 Acute upper respiratory infection, unspecified: Secondary | ICD-10-CM | POA: Diagnosis not present

## 2020-12-18 DIAGNOSIS — U071 COVID-19: Secondary | ICD-10-CM | POA: Diagnosis not present

## 2020-12-18 DIAGNOSIS — J4521 Mild intermittent asthma with (acute) exacerbation: Secondary | ICD-10-CM | POA: Diagnosis not present

## 2021-01-01 DIAGNOSIS — J4521 Mild intermittent asthma with (acute) exacerbation: Secondary | ICD-10-CM | POA: Diagnosis not present

## 2021-01-01 DIAGNOSIS — U071 COVID-19: Secondary | ICD-10-CM | POA: Diagnosis not present

## 2021-01-01 DIAGNOSIS — J069 Acute upper respiratory infection, unspecified: Secondary | ICD-10-CM | POA: Diagnosis not present

## 2021-01-15 ENCOUNTER — Telehealth: Payer: Self-pay | Admitting: Orthopaedic Surgery

## 2021-03-15 DIAGNOSIS — I10 Essential (primary) hypertension: Secondary | ICD-10-CM | POA: Diagnosis not present

## 2021-03-15 DIAGNOSIS — R7301 Impaired fasting glucose: Secondary | ICD-10-CM | POA: Diagnosis not present

## 2021-04-02 DIAGNOSIS — I10 Essential (primary) hypertension: Secondary | ICD-10-CM | POA: Diagnosis not present

## 2021-04-02 DIAGNOSIS — E782 Mixed hyperlipidemia: Secondary | ICD-10-CM | POA: Diagnosis not present

## 2021-04-02 DIAGNOSIS — D649 Anemia, unspecified: Secondary | ICD-10-CM | POA: Diagnosis not present

## 2021-04-02 DIAGNOSIS — R7301 Impaired fasting glucose: Secondary | ICD-10-CM | POA: Diagnosis not present

## 2021-05-25 ENCOUNTER — Other Ambulatory Visit: Payer: Self-pay | Admitting: Orthopaedic Surgery

## 2021-06-22 DIAGNOSIS — R109 Unspecified abdominal pain: Secondary | ICD-10-CM | POA: Diagnosis not present

## 2021-06-22 DIAGNOSIS — R531 Weakness: Secondary | ICD-10-CM | POA: Diagnosis not present

## 2021-07-09 ENCOUNTER — Other Ambulatory Visit: Payer: Self-pay

## 2021-07-09 ENCOUNTER — Encounter (HOSPITAL_COMMUNITY): Payer: Self-pay | Admitting: *Deleted

## 2021-07-09 ENCOUNTER — Emergency Department (HOSPITAL_COMMUNITY)
Admission: EM | Admit: 2021-07-09 | Discharge: 2021-07-09 | Disposition: A | Discharge status: Home / Self Care | Payer: BC Managed Care – PPO | Attending: Emergency Medicine | Admitting: Emergency Medicine

## 2021-07-09 ENCOUNTER — Emergency Department (HOSPITAL_COMMUNITY): Payer: BC Managed Care – PPO

## 2021-07-09 DIAGNOSIS — J351 Hypertrophy of tonsils: Secondary | ICD-10-CM | POA: Diagnosis not present

## 2021-07-09 DIAGNOSIS — R221 Localized swelling, mass and lump, neck: Secondary | ICD-10-CM | POA: Insufficient documentation

## 2021-07-09 DIAGNOSIS — L0211 Cutaneous abscess of neck: Secondary | ICD-10-CM | POA: Diagnosis not present

## 2021-07-09 DIAGNOSIS — Z79899 Other long term (current) drug therapy: Secondary | ICD-10-CM | POA: Diagnosis not present

## 2021-07-09 DIAGNOSIS — I10 Essential (primary) hypertension: Secondary | ICD-10-CM | POA: Insufficient documentation

## 2021-07-09 LAB — CBC WITH DIFFERENTIAL/PLATELET
Abs Immature Granulocytes: 0.02 10*3/uL (ref 0.00–0.07)
Basophils Absolute: 0.1 10*3/uL (ref 0.0–0.1)
Basophils Relative: 1 %
Eosinophils Absolute: 0.3 10*3/uL (ref 0.0–0.5)
Eosinophils Relative: 3 %
HCT: 43.6 % (ref 39.0–52.0)
Hemoglobin: 14.5 g/dL (ref 13.0–17.0)
Immature Granulocytes: 0 %
Lymphocytes Relative: 24 %
Lymphs Abs: 2.1 10*3/uL (ref 0.7–4.0)
MCH: 31 pg (ref 26.0–34.0)
MCHC: 33.3 g/dL (ref 30.0–36.0)
MCV: 93.2 fL (ref 80.0–100.0)
Monocytes Absolute: 0.7 10*3/uL (ref 0.1–1.0)
Monocytes Relative: 8 %
Neutro Abs: 5.7 10*3/uL (ref 1.7–7.7)
Neutrophils Relative %: 64 %
Platelets: 236 10*3/uL (ref 150–400)
RBC: 4.68 MIL/uL (ref 4.22–5.81)
RDW: 15.3 % (ref 11.5–15.5)
WBC: 8.9 10*3/uL (ref 4.0–10.5)
nRBC: 0 % (ref 0.0–0.2)

## 2021-07-09 LAB — BASIC METABOLIC PANEL
Anion gap: 6 (ref 5–15)
BUN: 23 mg/dL (ref 8–23)
CO2: 26 mmol/L (ref 22–32)
Calcium: 8.1 mg/dL — ABNORMAL LOW (ref 8.9–10.3)
Chloride: 104 mmol/L (ref 98–111)
Creatinine, Ser: 1.28 mg/dL — ABNORMAL HIGH (ref 0.61–1.24)
GFR, Estimated: >60 mL/min (ref >60)
Glucose, Bld: 91 mg/dL (ref 70–99)
Potassium: 4.1 mmol/L (ref 3.5–5.1)
Sodium: 136 mmol/L (ref 135–145)

## 2021-07-09 MED ORDER — CLINDAMYCIN HCL 300 MG PO CAPS: 300.0000 mg | ORAL_CAPSULE | Freq: Three times a day (TID) | ORAL | 0 refills | Status: AC

## 2021-07-09 MED ORDER — IOHEXOL 350 MG/ML SOLN
100.0000 mL | Freq: Once | INTRAVENOUS | Status: AC | PRN
  Administered 2021-07-09: 60 mL via INTRAVENOUS

## 2021-07-09 NOTE — ED Triage Notes (Signed)
SWELLING LEFT SIDE OF NECK, STATES HE WAS SEEN AT URGENT CARE A WEEK AGO AND GIVEN ANTIBIOTICS, STATES HE HAS SEEN NO IMPROVEMENT AND THINGS ARE WORSE

## 2021-07-09 NOTE — Discharge Instructions (Addendum)
Imaging shows that you have a large mass on the left side of your neck  I have started you on antibiotics to cover you for infection please take as prescribed.  I want you to stop taking your Augmentin.  Give the contact information for ENT please call their office tomorrow to schedule immediate follow-up appointment.  I want to come back to the emergency department if you become short of breath, unable to swallow your own saliva, you have tongue or throat swelling as you will need further evaluation.

## 2021-07-09 NOTE — ED Provider Notes (Signed)
[facility] EMERGENCY DEPARTMENT Provider Note   CSN: [id number] Arrival date & time: 07/09/21  1404     History Chief Complaint  Joe [name] presents with   Abscess    Joe [name] is a 65 y.o. male.  HPI  Joe [name] with no significant medical history presents with chief complaint of a left-sided neck mass.  Joe [name] states Joe [name] noticed this mass approximately 1 week ago on Sunday, states that Joe [name] went to urgent care where they started him on Augmentin.  Joe [name] states Joe [name] has been taking this but the mass has gotten larger. Joe [name] states Joe [name] has some tenderness on that side, and states Joe [name] feels as if it started to press on his trachea, Joe [name] states his feels at times it difficult to swallow.  Joe [name] denies subjective fevers or chills, chest pain, shortness of breath, Joe [name] has no history of IV drug use, denies tobacco use, does not smoke.  Joe [name] states that Joe [name] does have poor dental hygiene but denies dental cavity at this time, Joe [name] denies any jaw pain, torticollis, trismus, Joe [name] is not immunocompromise, has no other complaints at this time.  Joe [name] is not endorse headaches, fevers, chills, chest pain, shortness of breath, worsening pedal edema.  Past Medical History:  Diagnosis Date   Hypertension     There are no problems to display for this Joe [name].   Past Surgical History:  Procedure Laterality Date   carpel tunnel         Family History  Problem Relation Age of Onset   Healthy Mother    Healthy Father     Social History   Tobacco Use   Smoking status: Never   Smokeless tobacco: Never  Substance Use Topics   Alcohol use: Yes    Comment: social drinker   Drug use: No    Home Medications Prior to Admission medications   Medication Sig Start Date End Date Taking? Authorizing Provider  allopurinol (ZYLOPRIM) 300 MG tablet TAKE 1 TABLET BY MOUTH EVERY DAY 01/15/21  Yes Sanjuana Kava, MD  amLODipine (NORVASC) 10 MG tablet Take 10 mg by mouth daily. 04/26/20  Yes [provider]   amoxicillin-clavulanate (AUGMENTIN) 875-125 MG tablet Take 1 tablet by mouth 2 (two) times daily. 07/03/21  Yes [provider]  buPROPion (WELLBUTRIN XL) 150 MG 24 hr tablet Take 150 mg by mouth 2 (two) times daily.   Yes [provider]  clindamycin (CLEOCIN) 300 MG capsule Take 1 capsule (300 mg total) by mouth 3 (three) times daily for 7 days. 07/09/21 07/16/21 Yes Marcello Fennel, PA-C  LORazepam (ATIVAN) 0.5 MG tablet Take 0.5 mg by mouth daily as needed for anxiety or sleep.   Yes [provider]  losartan (COZAAR) 100 MG tablet Take 100 mg by mouth daily. 05/09/20  Yes [provider]  olmesartan-hydrochlorothiazide (BENICAR HCT) 40-12.5 MG tablet Take 1 tablet by mouth daily.   Yes [provider]  sertraline (ZOLOFT) 50 MG tablet Take 50 mg by mouth daily.   Yes [provider]  albuterol (VENTOLIN HFA) 108 (90 Base) MCG/ACT inhaler Inhale 2 puffs into the lungs every 6 (six) hours as needed for wheezing or shortness of breath.  Joe [name] not taking: Reported on 07/09/2021 05/17/19 05/16/20  [provider]  buPROPion (WELLBUTRIN SR) 150 MG 12 hr tablet Take 150 mg by mouth 2 (two) times daily. Joe [name] not taking: No sig reported 06/28/21   [provider]  naproxen (NAPROSYN) 500 MG tablet TAKE 1 TABLET BY  MOUTH TWICE DAILY WITH A MEAL Joe [name] not taking: No sig reported 05/28/21   Carole Civil, MD  naproxen sodium (ALEVE) 220 MG tablet Take 440 mg by mouth daily as needed (pain). Joe [name] not taking: Reported on 07/09/2021    [provider]  predniSONE (DELTASONE) 20 MG tablet Take 2 tablets (40 mg total) by mouth daily. Joe [name] not taking: No sig reported 06/08/19   Davonna Belling, MD  simvastatin (ZOCOR) 40 MG tablet Take 40 mg by mouth 3 (three) times a week. Joe [name] not taking: No sig reported 06/26/21   [provider]    Allergies    Joe [name] has no known allergies.  Review of Systems    Review of Systems  Constitutional:  Negative for chills and fever.  HENT:  Positive for trouble swallowing. Negative for congestion and voice change.   Eyes:  Negative for visual disturbance.  Respiratory:  Negative for shortness of breath.   Cardiovascular:  Negative for chest pain.  Gastrointestinal:  Negative for abdominal pain, diarrhea, nausea and vomiting.  Genitourinary:  Negative for enuresis.  Musculoskeletal:  Negative for back pain.  Skin:  Negative for rash.  Neurological:  Negative for dizziness.  Hematological:  Does not bruise/bleed easily.   Physical Exam Updated Vital Signs BP 120/82   Pulse 85   Temp 98.1 F (36.7 C)   Resp 18   Ht 5\' 9"  (1.753 m)   Wt (!) 154 kg   SpO2 96%   BMI 50.14 kg/m   Physical Exam Vitals and nursing note reviewed.  Constitutional:      General: Joe [name] is not in acute distress.    Appearance: Joe [name] is not ill-appearing.  HENT:     Head: Normocephalic and atraumatic.     Nose: No congestion.     Mouth/Throat:     Comments: Oropharynx is visualized tongue and uvula are both midline, controlling oral secretions, tonsils were symmetrical, no erythema or exudate present.  No tongue elevation noted.  Joe [name] does have poor dental hygiene but no obvious cavities on my exam, no erythema or fluctuance or induration noted on Joe [name]'s gumline.  No trismus or torticollis noted.  Joe [name] has a large mass on the left side of his neck below the left ramus no erythema present, area was soft to the touch, no induration or fluctuance noted.  Please see picture for full detail. Eyes:     Conjunctiva/sclera: Conjunctivae normal.  Cardiovascular:     Rate and Rhythm: Normal rate and regular rhythm.     Pulses: Normal pulses.     Heart sounds: No murmur heard.   No friction rub. No gallop.  Pulmonary:     Effort: No respiratory distress.     Breath sounds: No wheezing, rhonchi or rales.  Musculoskeletal:        General: Normal range of motion.      Right lower leg: No edema.     Left lower leg: No edema.  Skin:    General: Skin is warm and dry.  Neurological:     Mental Status: Joe [name] is alert.  Psychiatric:        Mood and Affect: Mood normal.       ED Results / Procedures / Treatments   Labs (all labs ordered are listed, but only abnormal results are displayed) Labs Reviewed  BASIC METABOLIC PANEL - Abnormal; Notable for the following components:      Result Value   Creatinine, Ser 1.28 (*)    Calcium  8.1 (*)    All other components within normal limits  CBC WITH DIFFERENTIAL/PLATELET    EKG None  Radiology CT Soft Tissue Neck W Contrast  Result Date: 07/09/2021 CLINICAL DATA:  Neck abscess, deep tissue EXAM: CT NECK WITH CONTRAST TECHNIQUE: Multidetector CT imaging of the neck was performed using the standard protocol following the bolus administration of intravenous contrast. CONTRAST:  65mL OMNIPAQUE IOHEXOL 350 MG/ML SOLN COMPARISON:  None. FINDINGS: Pharynx and larynx: The large left neck mass described below involves the left parapharyngeal space, exerting mild mass effect on the or pharyngeal airway. Tonsillar hypertrophy. Salivary glands: No inflammation, mass, or stone. The mass described below exerts mass effect on the left submandibular gland, which is displaced anteriorly Thyroid: Normal. Lymph nodes: Scattered prominent lymph nodes in the submandibular region bilaterally and upper cervical chain Vascular: The large left neck mass is inseparable from the left carotid and internal jugular vein, which is not well visualized. Limited evaluation of patency due to non arterial timing. Limited intracranial: No obvious acute intracranial abnormality in the visualized brain. Visualized orbits: Visualized orbits are unremarkable. Mastoids and visualized paranasal sinuses: Bilateral maxillary sinus mucosal thickening with air-fluid level on the left. Skeleton: Degenerative change. Upper chest: Visualized lung apices are clear.  Other: Large heterogeneous soft tissue mass within the left neck which measures up to proximally 7.9 x 6.1 by 8.2 cm. The mass is subjacent to and inseparable from the left sternocleidomastoid and extends from the skull base/parapharyngeal space inferiorly to the supraclavicular fossa. The mass obliterates the left internal jugular vein and exerts mild mass effect on the airway in the neck. In the mid neck, the mass abuts the cricoid cartilage on the left and the left strap musculature. In the lower neck, the mass abuts the left thyroid. IMPRESSION: 1. Large heterogeneous soft tissue mass in the left neck, extending from the skull base/parapharyngeal space inferiorly to the supraclavicular fossa, located immediately subjacent to and inseparable from the left sternocleidomastoid. The left internal jugular vein is obliterated by the mass and left carotid is inseparable from the mass, poorly visualized. Findings could represent malignancy or infectious phlegmon. The rapid development (Joe [name] reportedly only noticed the swelling for the past week) favors infection. Soft tissue sampling could provide more definitive diagnosis. 2. Nonspecific mild surrounding stranding and scattered prominent lymph nodes throughout the neck. Findings discussed with Dr. Almyra Free via telephone at 6:22 p.m. Electronically Signed   By: Margaretha Sheffield M.D.   On: 07/09/2021 18:41    Procedures Procedures   Medications Ordered in ED Medications  iohexol (OMNIPAQUE) 350 MG/ML injection 100 mL (60 mLs Intravenous Contrast Given 07/09/21 1740)    ED Course  I have reviewed the triage vital signs and the nursing notes.  Pertinent labs & imaging results that were available during my care of the Joe [name] were reviewed by me and considered in my medical decision making (see chart for details).  Clinical Course as of 07/09/21 1905  Mon Jul 09, 2021  1721 Creatinine(!): 1.28 [JH]    Clinical Course User Index [JH] Luna Fuse, MD    MDM Rules/Calculators/A&P                          Initial impression-Joe [name] presents with a left-sided neck mass.  Joe [name] is alert, does not appear acute chest, vital signs reassuring.  Concern for possible deep tissue infection will obtain CT soft neck for further evaluation.  Work-up-CBC unremarkable, BMP shows slight  elevation in creatinine 1.28.  CT soft neck shows a large heterogeneous soft tissue mass findings could represent malignancy or infectious phlegmon.  Reassessment-due to large size of mass and unclear etiology will consult ENT for further recommendations.  Updated Joe [name] on recommendation from ENT they are agreement this plan and are agreeable for discharge.  Consult-spoke to Dr. Constance Holster of ENT Joe [name] states that the Joe [name] does have a fever no white count Joe [name] can be started antibiotics and follow-up as an outpatient.  Joe [name] was see him with the next couple days.  Recommends clindamycin.  Rule out- I have low suspicion for peritonsillar abscess, retropharyngeal abscess, or Ludwig angina as oropharynx was visualized tongue and uvula were both midline, there is no exudates, erythema or edema noted in the posterior pillars or on/ around tonsils, Joe [name] is not drooling my exam tolerating p.o..  This on the Joe [name] has some slight deviation of the trachea seen on CT neck but there is no noted wheezing, stridor, Joe [name] is not hypoxic, I have low suspicion for respiratory failure at this time.    Plan-  Neck mass-unclear etiology possible this could be infectious will cover him with clindamycin as directed by ENT, will have him follow-up with ENT next couple days.  Given strict return precautions.  Vital signs have remained stable, no indication for hospital admission.  Joe [name] discussed with attending and they agreed with assessment and plan.  Joe [name] given at home care as well strict return precautions.  Joe [name] verbalized that they understood agreed to said plan.  Final Clinical  Impression(s) / ED Diagnoses Final diagnoses:  Neck mass    Rx / DC Orders ED Discharge Orders          Ordered    clindamycin (CLEOCIN) 300 MG capsule  3 times daily        07/09/21 1902             Aron Baba 07/09/21 1905    Luna Fuse, MD 07/25/21 1623

## 2021-07-11 DIAGNOSIS — R896 Abnormal cytological findings in specimens from other organs, systems and tissues: Secondary | ICD-10-CM | POA: Diagnosis not present

## 2021-07-11 DIAGNOSIS — R221 Localized swelling, mass and lump, neck: Secondary | ICD-10-CM | POA: Diagnosis not present

## 2021-07-13 ENCOUNTER — Other Ambulatory Visit: Payer: Self-pay

## 2021-07-13 ENCOUNTER — Encounter (HOSPITAL_COMMUNITY): Payer: Self-pay | Admitting: Otolaryngology

## 2021-07-13 NOTE — Progress Notes (Signed)
   07/13/21 1806  OBSTRUCTIVE SLEEP APNEA  Have you ever been diagnosed with sleep apnea through a sleep study? No  Do you snore loudly (loud enough to be heard through closed doors)?  1  Do you often feel tired, fatigued, or sleepy during the daytime (such as falling asleep during driving or talking to someone)? 0  Has anyone observed you stop breathing during your sleep? 0  Do you have, or are you being treated for high blood pressure? 1  BMI more than 35 kg/m2? 1  Age > 62 (1-yes) 1  Male Gender (Yes=1) 1  Obstructive Sleep Apnea Score 5  Score 5 or greater  Results sent to PCP

## 2021-07-13 NOTE — Progress Notes (Signed)
Spoke with pt for pre-op call. Pt denies cardiac history or Diabetes. Pt is treated for HTN and depression.   Will need Covid test on arrival.   Pt does not read. His niece is coming with him to help him read whatever he needs to sign being registered.

## 2021-07-14 ENCOUNTER — Encounter (HOSPITAL_COMMUNITY): Payer: Self-pay | Admitting: Otolaryngology

## 2021-07-14 ENCOUNTER — Ambulatory Visit (HOSPITAL_COMMUNITY)
Admission: RE | Admit: 2021-07-14 | Discharge: 2021-07-14 | Disposition: A | Discharge status: Home / Self Care | Payer: BC Managed Care – PPO | Attending: Otolaryngology | Admitting: Otolaryngology

## 2021-07-14 ENCOUNTER — Ambulatory Visit (HOSPITAL_COMMUNITY): Payer: BC Managed Care – PPO | Admitting: Anesthesiology

## 2021-07-14 ENCOUNTER — Encounter (HOSPITAL_COMMUNITY)
Admission: RE | Disposition: A | Discharge status: Home / Self Care | Payer: Self-pay | Source: Home / Self Care | Attending: Otolaryngology

## 2021-07-14 DIAGNOSIS — Z87891 Personal history of nicotine dependence: Secondary | ICD-10-CM | POA: Diagnosis not present

## 2021-07-14 DIAGNOSIS — Z791 Long term (current) use of non-steroidal anti-inflammatories (NSAID): Secondary | ICD-10-CM | POA: Insufficient documentation

## 2021-07-14 DIAGNOSIS — C8511 Unspecified B-cell lymphoma, lymph nodes of head, face, and neck: Secondary | ICD-10-CM | POA: Insufficient documentation

## 2021-07-14 DIAGNOSIS — I444 Left anterior fascicular block: Secondary | ICD-10-CM | POA: Insufficient documentation

## 2021-07-14 DIAGNOSIS — Z6841 Body Mass Index (BMI) 40.0 and over, adult: Secondary | ICD-10-CM | POA: Insufficient documentation

## 2021-07-14 DIAGNOSIS — I10 Essential (primary) hypertension: Secondary | ICD-10-CM | POA: Diagnosis not present

## 2021-07-14 DIAGNOSIS — Z79899 Other long term (current) drug therapy: Secondary | ICD-10-CM | POA: Diagnosis not present

## 2021-07-14 DIAGNOSIS — Z20822 Contact with and (suspected) exposure to covid-19: Secondary | ICD-10-CM | POA: Diagnosis not present

## 2021-07-14 DIAGNOSIS — R221 Localized swelling, mass and lump, neck: Secondary | ICD-10-CM | POA: Diagnosis not present

## 2021-07-14 HISTORY — DX: Unspecified osteoarthritis, unspecified site: M19.90

## 2021-07-14 HISTORY — DX: Depression, unspecified: F32.A

## 2021-07-14 HISTORY — DX: Anxiety disorder, unspecified: F41.9

## 2021-07-14 HISTORY — PX: MASS BIOPSY: SHX5445

## 2021-07-14 HISTORY — DX: COVID-19: U07.1

## 2021-07-14 LAB — SARS CORONAVIRUS 2 BY RT PCR (HOSPITAL ORDER, PERFORMED IN ~~LOC~~ HOSPITAL LAB): SARS Coronavirus 2: NEGATIVE

## 2021-07-14 SURGERY — BIOPSY, MASS, NECK
Anesthesia: General | Site: Neck | Laterality: Left

## 2021-07-14 MED ORDER — 0.9 % SODIUM CHLORIDE (POUR BTL) OPTIME
TOPICAL | Status: DC | PRN
  Administered 2021-07-14: 1000 mL

## 2021-07-14 MED ORDER — ONDANSETRON HCL 4 MG/2ML IJ SOLN
INTRAMUSCULAR | Status: DC | PRN
  Administered 2021-07-14: 4 mg via INTRAVENOUS

## 2021-07-14 MED ORDER — EPHEDRINE 5 MG/ML INJ
INTRAVENOUS | Status: AC
  Filled 2021-07-14: qty 5

## 2021-07-14 MED ORDER — MEPERIDINE HCL 25 MG/ML IJ SOLN: 6.2500 mg | INTRAMUSCULAR | Status: DC | PRN

## 2021-07-14 MED ORDER — FENTANYL CITRATE (PF) 250 MCG/5ML IJ SOLN
INTRAMUSCULAR | Status: DC | PRN
  Administered 2021-07-14 (×2): 50 ug via INTRAVENOUS

## 2021-07-14 MED ORDER — ONDANSETRON HCL 4 MG/2ML IJ SOLN
INTRAMUSCULAR | Status: AC
  Filled 2021-07-14: qty 2

## 2021-07-14 MED ORDER — MIDAZOLAM HCL 5 MG/5ML IJ SOLN
INTRAMUSCULAR | Status: DC | PRN
  Administered 2021-07-14: 1 mg via INTRAVENOUS

## 2021-07-14 MED ORDER — OXYCODONE HCL 5 MG PO TABS: 5.0000 mg | ORAL_TABLET | Freq: Once | ORAL | Status: DC | PRN

## 2021-07-14 MED ORDER — CEFAZOLIN SODIUM-DEXTROSE 2-3 GM-%(50ML) IV SOLR
INTRAVENOUS | Status: DC | PRN
  Administered 2021-07-14: 2 g via INTRAVENOUS

## 2021-07-14 MED ORDER — CHLORHEXIDINE GLUCONATE 0.12 % MT SOLN
15.0000 mL | Freq: Once | OROMUCOSAL | Status: AC
  Administered 2021-07-14: 15 mL via OROMUCOSAL
  Filled 2021-07-14: qty 15

## 2021-07-14 MED ORDER — PHENYLEPHRINE HCL (PRESSORS) 10 MG/ML IV SOLN
INTRAVENOUS | Status: DC | PRN
  Administered 2021-07-14 (×2): 80 ug via INTRAVENOUS

## 2021-07-14 MED ORDER — VASOPRESSIN 20 UNIT/ML IV SOLN
INTRAVENOUS | Status: AC
  Filled 2021-07-14: qty 1

## 2021-07-14 MED ORDER — LACTATED RINGERS IV SOLN: INTRAVENOUS | Status: DC

## 2021-07-14 MED ORDER — LIDOCAINE HCL (PF) 2 % IJ SOLN
INTRAMUSCULAR | Status: AC
  Filled 2021-07-14: qty 5

## 2021-07-14 MED ORDER — ACETAMINOPHEN 500 MG PO TABS
ORAL_TABLET | ORAL | Status: AC
  Administered 2021-07-14: 1000 mg via ORAL
  Filled 2021-07-14: qty 2

## 2021-07-14 MED ORDER — MIDAZOLAM HCL 2 MG/2ML IJ SOLN: 0.5000 mg | Freq: Once | INTRAMUSCULAR | Status: DC | PRN

## 2021-07-14 MED ORDER — PROPOFOL 10 MG/ML IV BOLUS
INTRAVENOUS | Status: AC
  Filled 2021-07-14: qty 20

## 2021-07-14 MED ORDER — LIDOCAINE HCL (CARDIAC) PF 100 MG/5ML IV SOSY
PREFILLED_SYRINGE | INTRAVENOUS | Status: DC | PRN
  Administered 2021-07-14: 20 mg via INTRATRACHEAL

## 2021-07-14 MED ORDER — LIDOCAINE-EPINEPHRINE 1 %-1:100000 IJ SOLN
INTRAMUSCULAR | Status: AC
  Filled 2021-07-14: qty 1

## 2021-07-14 MED ORDER — FENTANYL CITRATE (PF) 100 MCG/2ML IJ SOLN: 25.0000 ug | INTRAMUSCULAR | Status: DC | PRN

## 2021-07-14 MED ORDER — FENTANYL CITRATE (PF) 250 MCG/5ML IJ SOLN
INTRAMUSCULAR | Status: AC
  Filled 2021-07-14: qty 5

## 2021-07-14 MED ORDER — ORAL CARE MOUTH RINSE: 15.0000 mL | Freq: Once | OROMUCOSAL | Status: AC

## 2021-07-14 MED ORDER — CEFAZOLIN SODIUM 1 G IJ SOLR
INTRAMUSCULAR | Status: AC
  Filled 2021-07-14: qty 20

## 2021-07-14 MED ORDER — OXYCODONE HCL 5 MG/5ML PO SOLN: 5.0000 mg | Freq: Once | ORAL | Status: DC | PRN

## 2021-07-14 MED ORDER — BACITRACIN ZINC 500 UNIT/GM EX OINT
TOPICAL_OINTMENT | CUTANEOUS | Status: AC
  Filled 2021-07-14: qty 28.35

## 2021-07-14 MED ORDER — PHENYLEPHRINE 40 MCG/ML (10ML) SYRINGE FOR IV PUSH (FOR BLOOD PRESSURE SUPPORT)
PREFILLED_SYRINGE | INTRAVENOUS | Status: AC
  Filled 2021-07-14: qty 10

## 2021-07-14 MED ORDER — PROMETHAZINE HCL 25 MG/ML IJ SOLN: 6.2500 mg | INTRAMUSCULAR | Status: DC | PRN

## 2021-07-14 MED ORDER — VASOPRESSIN 20 UNIT/ML IV SOLN
INTRAVENOUS | Status: DC | PRN
  Administered 2021-07-14 (×2): 1 [IU] via INTRAVENOUS

## 2021-07-14 MED ORDER — MIDAZOLAM HCL 2 MG/2ML IJ SOLN
INTRAMUSCULAR | Status: AC
  Filled 2021-07-14: qty 2

## 2021-07-14 MED ORDER — SUCCINYLCHOLINE CHLORIDE 200 MG/10ML IV SOSY
PREFILLED_SYRINGE | INTRAVENOUS | Status: AC
  Filled 2021-07-14: qty 10

## 2021-07-14 MED ORDER — PROPOFOL 10 MG/ML IV BOLUS
INTRAVENOUS | Status: DC | PRN
  Administered 2021-07-14: 200 mg via INTRAVENOUS

## 2021-07-14 MED ORDER — ACETAMINOPHEN 500 MG PO TABS: 1000.0000 mg | ORAL_TABLET | Freq: Once | ORAL | Status: AC

## 2021-07-14 MED ORDER — EPHEDRINE SULFATE 50 MG/ML IJ SOLN
INTRAMUSCULAR | Status: DC | PRN
  Administered 2021-07-14: 5 mg via INTRAVENOUS
  Administered 2021-07-14: 10 mg via INTRAVENOUS
  Administered 2021-07-14: 5 mg via INTRAVENOUS
  Administered 2021-07-14: 10 mg via INTRAVENOUS

## 2021-07-14 MED ORDER — DEXAMETHASONE SODIUM PHOSPHATE 10 MG/ML IJ SOLN
INTRAMUSCULAR | Status: AC
  Filled 2021-07-14: qty 1

## 2021-07-14 MED ORDER — LIDOCAINE-EPINEPHRINE 1 %-1:100000 IJ SOLN
INTRAMUSCULAR | Status: DC | PRN
  Administered 2021-07-14: 4 mL

## 2021-07-14 SURGICAL SUPPLY — 45 items
BAG COUNTER SPONGE SURGICOUNT (BAG) ×2 IMPLANT
BLADE SURG 15 STRL LF DISP TIS (BLADE) IMPLANT
BLADE SURG 15 STRL SS (BLADE)
CANISTER SUCT 3000ML PPV (MISCELLANEOUS) IMPLANT
CLEANER TIP ELECTROSURG 2X2 (MISCELLANEOUS) ×2 IMPLANT
CNTNR URN SCR LID CUP LEK RST (MISCELLANEOUS) ×1 IMPLANT
CONT SPEC 4OZ STRL OR WHT (MISCELLANEOUS) ×2
CORD BIPOLAR FORCEPS 12FT (ELECTRODE) ×2 IMPLANT
COVER SURGICAL LIGHT HANDLE (MISCELLANEOUS) ×2 IMPLANT
DERMABOND ADVANCED (GAUZE/BANDAGES/DRESSINGS) ×1
DERMABOND ADVANCED .7 DNX12 (GAUZE/BANDAGES/DRESSINGS) ×1 IMPLANT
DRAIN PENROSE 1/4X12 LTX STRL (WOUND CARE) IMPLANT
DRAPE HALF SHEET 40X57 (DRAPES) ×2 IMPLANT
DRSG EMULSION OIL 3X3 NADH (GAUZE/BANDAGES/DRESSINGS) IMPLANT
ELECT COATED BLADE 2.86 ST (ELECTRODE) ×2 IMPLANT
ELECT NEEDLE TIP 2.8 STRL (NEEDLE) IMPLANT
ELECT REM PT RETURN 9FT ADLT (ELECTROSURGICAL) ×2
ELECTRODE REM PT RTRN 9FT ADLT (ELECTROSURGICAL) ×1 IMPLANT
FORCEPS BIPOLAR SPETZLER 8 1.0 (NEUROSURGERY SUPPLIES) ×2 IMPLANT
GAUZE SPONGE 4X4 12PLY STRL (GAUZE/BANDAGES/DRESSINGS) IMPLANT
GLOVE SURG ENC MOIS LTX SZ7.5 (GLOVE) ×6 IMPLANT
GOWN STRL REUS W/ TWL LRG LVL3 (GOWN DISPOSABLE) ×2 IMPLANT
GOWN STRL REUS W/TWL LRG LVL3 (GOWN DISPOSABLE) ×4
KIT BASIN OR (CUSTOM PROCEDURE TRAY) ×2 IMPLANT
KIT TURNOVER KIT B (KITS) ×2 IMPLANT
NEEDLE 22X1 1/2 (OR ONLY) (NEEDLE) ×2 IMPLANT
NEEDLE HYPO 25GX1X1/2 BEV (NEEDLE) IMPLANT
NS IRRIG 1000ML POUR BTL (IV SOLUTION) ×2 IMPLANT
PAD ARMBOARD 7.5X6 YLW CONV (MISCELLANEOUS) ×4 IMPLANT
PENCIL SMOKE EVACUATOR (MISCELLANEOUS) ×2 IMPLANT
POSITIONER HEAD DONUT 9IN (MISCELLANEOUS) ×2 IMPLANT
SPONGE T-LAP 18X18 ~~LOC~~+RFID (SPONGE) ×2 IMPLANT
SUT CHROMIC 4 0 P 3 18 (SUTURE) IMPLANT
SUT ETHILON 4 0 PS 2 18 (SUTURE) IMPLANT
SUT ETHILON 5 0 P 3 18 (SUTURE)
SUT NYLON ETHILON 5-0 P-3 1X18 (SUTURE) IMPLANT
SUT SILK 4 0 (SUTURE)
SUT SILK 4-0 18XBRD TIE 12 (SUTURE) IMPLANT
SUT VIC AB 3-0 FS2 27 (SUTURE) ×2 IMPLANT
SUT VIC AB 4-0 PS2 27 (SUTURE) ×2 IMPLANT
SWAB COLLECTION DEVICE MRSA (MISCELLANEOUS) IMPLANT
SWAB CULTURE ESWAB REG 1ML (MISCELLANEOUS) IMPLANT
SYR BULB IRRIG 60ML STRL (SYRINGE) IMPLANT
SYR TB 1ML LUER SLIP (SYRINGE) IMPLANT
TRAY ENT MC OR (CUSTOM PROCEDURE TRAY) ×2 IMPLANT

## 2021-07-14 NOTE — Brief Op Note (Signed)
07/14/2021  9:55 AM  PATIENT:  Joe [name]  65 y.o. male  PRE-OPERATIVE DIAGNOSIS:  NECK  MASS  POST-OPERATIVE DIAGNOSIS:  Neck Mass  PROCEDURE:  Procedure(s): NECK MASS BIOPSY (Left)  SURGEON:  Surgeon(s) and Role:    Melida Quitter, MD - Primary  PHYSICIAN ASSISTANT:   ASSISTANTS: none   ANESTHESIA:   general  EBL:  15 mL   BLOOD ADMINISTERED:none  DRAINS: none   LOCAL MEDICATIONS USED:  LIDOCAINE   SPECIMEN:  Source of Specimen:  Left neck mass for lymphoma protocol  DISPOSITION OF SPECIMEN:  PATHOLOGY  COUNTS:  YES  TOURNIQUET:  * No tourniquets in log *  DICTATION: .Note written in EPIC  PLAN OF CARE: Discharge to home after PACU  PATIENT DISPOSITION:  PACU - hemodynamically stable.   Delay start of Pharmacological VTE agent (>24hrs) due to surgical blood loss or risk of bleeding: no

## 2021-07-14 NOTE — Transfer of Care (Signed)
Immediate Anesthesia Transfer of Care Note  Patient: Joe [name]  Procedure(s) Performed: NECK MASS BIOPSY (Left: Neck)  Patient Location: PACU  Anesthesia Type:General  Level of Consciousness: awake  Airway & Oxygen Therapy: Patient Spontanous Breathing and Patient connected to face mask oxygen  Post-op Assessment: Report given to RN and Post -op Vital signs reviewed and stable  Post vital signs: Reviewed and stable  Last Vitals:  Vitals Value Taken Time  BP 126/59 07/14/21 1025  Temp    Pulse 70 07/14/21 1034  Resp 20 07/14/21 1034  SpO2 95 % 07/14/21 1034  Vitals shown include unvalidated device data.  Last Pain:  Vitals:   07/14/21 0715  TempSrc: Oral  PainSc: 0-No pain      Patients Stated Pain Goal: 5 (92/42/68 3419)  Complications: No notable events documented.

## 2021-07-14 NOTE — H&P (Signed)
Joe [name] is an 65 y.o. male.   Chief Complaint: Left neck mass HPI: 65 year old male with rapidly growing left neck mass.  FNA suggests lymphoma.  He presents for open biopsy.  Past Medical History:  Diagnosis Date  . Anxiety   . Arthritis   . COVID    x 2 (2020 and 2022)  . Depression   . Hypertension     Past Surgical History:  Procedure Laterality Date  . carpel tunnel      Family History  Problem Relation Age of Onset  . Healthy Mother   . Healthy Father    Social History:  reports that he has quit smoking. His smoking use included cigarettes. He has quit using smokeless tobacco.  His smokeless tobacco use included chew. He reports current alcohol use. He reports that he does not use drugs.  Allergies: No Known Allergies  Medications Prior to Admission  Medication Sig Dispense Refill  . allopurinol (ZYLOPRIM) 300 MG tablet TAKE 1 TABLET BY MOUTH EVERY DAY 30 tablet 5  . amLODipine (NORVASC) 10 MG tablet Take 10 mg by mouth daily.    [name] amoxicillin-clavulanate (AUGMENTIN) 875-125 MG tablet Take 1 tablet by mouth 2 (two) times daily.    [name] buPROPion (WELLBUTRIN XL) 150 MG 24 hr tablet Take 150 mg by mouth 2 (two) times daily.    . clindamycin (CLEOCIN) 300 MG capsule Take 1 capsule (300 mg total) by mouth 3 (three) times daily for 7 days. 21 capsule 0  . LORazepam (ATIVAN) 0.5 MG tablet Take 0.5 mg by mouth daily as needed for anxiety or sleep.    [name] losartan (COZAAR) 100 MG tablet Take 100 mg by mouth daily.    . sertraline (ZOLOFT) 50 MG tablet Take 50 mg by mouth daily.    [name] albuterol (VENTOLIN HFA) 108 (90 Base) MCG/ACT inhaler Inhale 2 puffs into the lungs every 6 (six) hours as needed for wheezing or shortness of breath.  (Patient not taking: Reported on 07/09/2021)    . buPROPion (WELLBUTRIN SR) 150 MG 12 hr tablet Take 150 mg by mouth 2 (two) times daily. (Patient not taking: No sig reported)    . naproxen (NAPROSYN) 500 MG tablet TAKE 1 TABLET BY MOUTH TWICE  DAILY WITH A MEAL (Patient not taking: No sig reported) 60 tablet 5  . naproxen sodium (ALEVE) 220 MG tablet Take 440 mg by mouth daily as needed (pain). (Patient not taking: Reported on 07/09/2021)    . olmesartan-hydrochlorothiazide (BENICAR HCT) 40-12.5 MG tablet Take 1 tablet by mouth daily.    . predniSONE (DELTASONE) 20 MG tablet Take 2 tablets (40 mg total) by mouth daily. (Patient not taking: No sig reported) 8 tablet 0  . simvastatin (ZOCOR) 40 MG tablet Take 40 mg by mouth 3 (three) times a week. (Patient not taking: No sig reported)      Results for orders placed or performed during the hospital encounter of 07/14/21 (from the past 48 hour(s))  SARS Coronavirus 2 by RT PCR (hospital order, performed in [facility] hospital lab) Nasopharyngeal Nasopharyngeal Swab     Status: None   Collection Time: 07/14/21  6:39 AM   Specimen: Nasopharyngeal Swab  Result Value Ref Range   SARS Coronavirus 2 NEGATIVE NEGATIVE    Comment: (NOTE) SARS-CoV-2 target nucleic acids are NOT DETECTED.  The SARS-CoV-2 RNA is generally detectable in upper and lower respiratory specimens during the acute phase of infection. The lowest concentration of SARS-CoV-2 viral copies  this assay can detect is 250 copies / mL. A negative result does not preclude SARS-CoV-2 infection and should not be used as the sole basis for treatment or other patient management decisions.  A negative result may occur with improper specimen collection / handling, submission of specimen other than nasopharyngeal swab, presence of viral mutation(s) within the areas targeted by this assay, and inadequate number of viral copies (<250 copies / mL). A negative result must be combined with clinical observations, patient history, and epidemiological information.  Fact Sheet for Patients:   StrictlyIdeas.no  Fact Sheet for Healthcare Providers: BankingDealers.co.za  This test is not yet  approved or  cleared by the [place] FDA and has been authorized for detection and/or diagnosis of SARS-CoV-2 by FDA under an Emergency Use Authorization (EUA).  This EUA will remain in effect (meaning this test can be used) for the duration of the COVID-19 declaration under Section 564(b)(1) of the Act, 21 U.S.C. section 360bbb-3(b)(1), unless the authorization is terminated or revoked sooner.  Performed at [facility], [place] [address] [place], [place] [address]    No results found.  Review of Systems  All other systems reviewed and are negative.  Blood pressure 139/84, pulse 96, temperature 98.2 F (36.8 C), temperature source Oral, resp. rate 20, height 5\' 9"  (1.753 m), weight (!) 150.6 kg, SpO2 95 %. Physical Exam Constitutional:      Appearance: Normal appearance. He is obese.  HENT:     Head: Normocephalic and atraumatic.     Right Ear: External ear normal.     Left Ear: External ear normal.     Nose: Nose normal.     Mouth/Throat:     Mouth: Mucous membranes are moist.     Pharynx: Oropharynx is clear.  Eyes:     Extraocular Movements: Extraocular movements intact.     Conjunctiva/sclera: Conjunctivae normal.     Pupils: Pupils are equal, round, and reactive to light.  Neck:     Comments: Large left neck mass. Cardiovascular:     Rate and Rhythm: Normal rate.  Pulmonary:     Effort: Pulmonary effort is normal.  Skin:    General: Skin is warm and dry.  Neurological:     General: No focal deficit present.     Mental Status: He is alert and oriented to person, place, and time. Mental status is at baseline.  Psychiatric:        Mood and Affect: Mood normal.        Behavior: Behavior normal.        Thought Content: Thought content normal.        Judgment: Judgment normal.     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  Melida Quitter, MD 07/14/2021, 8:30 AM

## 2021-07-14 NOTE — Anesthesia Preprocedure Evaluation (Addendum)
Anesthesia Evaluation  Patient identified by MRN, date of birth, ID band Patient awake    Reviewed: Allergy & Precautions, NPO status , Patient's Chart, lab work & pertinent test results  History of Anesthesia Complications Negative for: history of anesthetic complications  Airway Mallampati: II  TM Distance: >3 FB Neck ROM: Full    Dental  (+) Poor Dentition, Missing, Dental Advisory Given, Chipped   Pulmonary neg COPD, former smoker,  07/14/2021 SARS coronavirus NEG   breath sounds clear to auscultation       Cardiovascular hypertension, Pt. on medications (-) angina Rhythm:Regular Rate:Normal     Neuro/Psych negative neurological ROS     GI/Hepatic negative GI ROS, Neg liver ROS,   Endo/Other  Morbid obesity  Renal/GU negative Renal ROS     Musculoskeletal  (+) Arthritis ,   Abdominal (+) + obese,   Peds  Hematology negative hematology ROS (+)   Anesthesia Other Findings   Reproductive/Obstetrics                             Anesthesia Physical Anesthesia Plan  ASA: 3  Anesthesia Plan: General   Post-op Pain Management:    Induction: Intravenous  PONV Risk Score and Plan: 2 and Ondansetron and Dexamethasone  Airway Management Planned: LMA  Additional Equipment: None  Intra-op Plan:   Post-operative Plan:   Informed Consent: I have reviewed the patients History and Physical, chart, labs and discussed the procedure including the risks, benefits and alternatives for the proposed anesthesia with the patient or authorized representative who has indicated his/her understanding and acceptance.     Dental advisory given  Plan Discussed with: CRNA and Surgeon  Anesthesia Plan Comments:         Anesthesia Quick Evaluation

## 2021-07-14 NOTE — Op Note (Signed)
Preop diagnosis: Left neck mass Postop diagnosis: same Procedure: Open biopsy left neck mass Surgeon: Redmond Baseman Assist: none Anesth: General and local with 1% lidocaine with 1:100,000 epinephrine Compl: None Findings: Large left neck mass.  Small portion removed and sent for lymphoma protocol Description:  After discussing risks, benefits, and alternatives, the patient was brought to the operative suite and placed on the operative table in the supine position.  Anesthesia was induced and the patient was intubated by the anesthesia team without difficulty.  The left neck incision was marked with a marking pen and injected with local anesthetic.  The neck was prepped and draped in sterile fashion.  The incision was made with a 15 blade scalpel and extended through the subcutaneous and platysma layers with Bovie electrocautery.  Soft tissues were dissected to the surface of the mass.  Pieces of the mass were then removed using electrocautery and forceps.  The specimen was passed to nursing for pathology for lymphoma protocol.  Bleeding was controlled with bipolar cautery.  The wound was copiously irrigated with saline.  The platysma was closed with 3-0 Vicryl in a simple, interrupted fashion.  The skin was closed in the subcutaneous layer with 4-0 Vicryl in a simple, interrupted fashion.  Skin glue was added.  The patient was cleaned off and drapes were removed.  He was then returned to anesthesia for wake-up and was extubated and moved to the recovery room in stable condition.

## 2021-07-14 NOTE — Anesthesia Postprocedure Evaluation (Signed)
Anesthesia Post Note  Patient: Joe [name]  Procedure(s) Performed: NECK MASS BIOPSY (Left: Neck)     Patient location during evaluation: PACU Anesthesia Type: General Level of consciousness: awake and alert, patient cooperative and oriented Pain management: pain level controlled Vital Signs Assessment: post-procedure vital signs reviewed and stable Respiratory status: spontaneous breathing, nonlabored ventilation and respiratory function stable Cardiovascular status: blood pressure returned to baseline and stable Postop Assessment: no apparent nausea or vomiting, able to ambulate and adequate PO intake Anesthetic complications: no   No notable events documented.  Last Vitals:  Vitals:   07/14/21 1039 07/14/21 1054  BP: 128/75 124/77  Pulse: 76 80  Resp: 18 17  Temp:  37.3 C  SpO2: 95% 95%    Last Pain:  Vitals:   07/14/21 1054  TempSrc:   PainSc: 3                  Madden Garron,E. Bell Cai

## 2021-07-14 NOTE — Anesthesia Procedure Notes (Signed)
Procedure Name: Intubation Date/Time: 07/14/2021 9:38 AM Performed by: Clovis Cao, CRNA Pre-anesthesia Checklist: Patient identified, Emergency Drugs available, Suction available and Patient being monitored Patient Re-evaluated:Patient Re-evaluated prior to induction Oxygen Delivery Method: Circle system utilized Preoxygenation: Pre-oxygenation with 100% oxygen Induction Type: IV induction LMA: LMA inserted LMA Size: 4.0 Number of attempts: 1 Placement Confirmation: positive ETCO2 and breath sounds checked- equal and bilateral Tube secured with: Tape Dental Injury: Teeth and Oropharynx as per pre-operative assessment

## 2021-07-15 ENCOUNTER — Encounter (HOSPITAL_COMMUNITY): Payer: Self-pay | Admitting: Otolaryngology

## 2021-07-18 LAB — SURGICAL PATHOLOGY

## 2021-07-21 DIAGNOSIS — I2699 Other pulmonary embolism without acute cor pulmonale: Secondary | ICD-10-CM

## 2021-07-21 DIAGNOSIS — I82402 Acute embolism and thrombosis of unspecified deep veins of left lower extremity: Secondary | ICD-10-CM

## 2021-07-21 HISTORY — DX: Other pulmonary embolism without acute cor pulmonale: I26.99

## 2021-07-21 HISTORY — DX: Acute embolism and thrombosis of unspecified deep veins of left lower extremity: I82.402

## 2021-07-23 DIAGNOSIS — F32A Depression, unspecified: Secondary | ICD-10-CM | POA: Diagnosis not present

## 2021-07-23 DIAGNOSIS — Z79899 Other long term (current) drug therapy: Secondary | ICD-10-CM | POA: Diagnosis not present

## 2021-07-23 DIAGNOSIS — S0990XA Unspecified injury of head, initial encounter: Secondary | ICD-10-CM | POA: Diagnosis not present

## 2021-07-23 DIAGNOSIS — R791 Abnormal coagulation profile: Secondary | ICD-10-CM | POA: Diagnosis not present

## 2021-07-23 DIAGNOSIS — C8591 Non-Hodgkin lymphoma, unspecified, lymph nodes of head, face, and neck: Secondary | ICD-10-CM | POA: Diagnosis not present

## 2021-07-23 DIAGNOSIS — E785 Hyperlipidemia, unspecified: Secondary | ICD-10-CM | POA: Diagnosis not present

## 2021-07-23 DIAGNOSIS — J690 Pneumonitis due to inhalation of food and vomit: Secondary | ICD-10-CM | POA: Diagnosis not present

## 2021-07-23 DIAGNOSIS — R0689 Other abnormalities of breathing: Secondary | ICD-10-CM | POA: Diagnosis not present

## 2021-07-23 DIAGNOSIS — I10 Essential (primary) hypertension: Secondary | ICD-10-CM | POA: Diagnosis not present

## 2021-07-23 DIAGNOSIS — E876 Hypokalemia: Secondary | ICD-10-CM | POA: Diagnosis not present

## 2021-07-23 DIAGNOSIS — E6609 Other obesity due to excess calories: Secondary | ICD-10-CM | POA: Diagnosis not present

## 2021-07-23 DIAGNOSIS — R918 Other nonspecific abnormal finding of lung field: Secondary | ICD-10-CM | POA: Diagnosis not present

## 2021-07-23 DIAGNOSIS — J9601 Acute respiratory failure with hypoxia: Secondary | ICD-10-CM | POA: Diagnosis not present

## 2021-07-23 DIAGNOSIS — Z6841 Body Mass Index (BMI) 40.0 and over, adult: Secondary | ICD-10-CM | POA: Diagnosis not present

## 2021-07-23 DIAGNOSIS — R55 Syncope and collapse: Secondary | ICD-10-CM | POA: Diagnosis not present

## 2021-07-23 DIAGNOSIS — M542 Cervicalgia: Secondary | ICD-10-CM | POA: Diagnosis not present

## 2021-07-23 DIAGNOSIS — R404 Transient alteration of awareness: Secondary | ICD-10-CM | POA: Diagnosis not present

## 2021-07-23 DIAGNOSIS — J189 Pneumonia, unspecified organism: Secondary | ICD-10-CM | POA: Diagnosis not present

## 2021-07-23 DIAGNOSIS — F419 Anxiety disorder, unspecified: Secondary | ICD-10-CM | POA: Diagnosis not present

## 2021-07-23 DIAGNOSIS — R4182 Altered mental status, unspecified: Secondary | ICD-10-CM | POA: Diagnosis not present

## 2021-07-23 DIAGNOSIS — J9602 Acute respiratory failure with hypercapnia: Secondary | ICD-10-CM | POA: Diagnosis not present

## 2021-07-23 DIAGNOSIS — R739 Hyperglycemia, unspecified: Secondary | ICD-10-CM | POA: Diagnosis not present

## 2021-07-23 DIAGNOSIS — N179 Acute kidney failure, unspecified: Secondary | ICD-10-CM | POA: Diagnosis not present

## 2021-07-24 ENCOUNTER — Telehealth: Payer: Self-pay | Admitting: Hematology

## 2021-07-24 NOTE — Telephone Encounter (Signed)
Pt's niece Jarrett Soho called to sch appt per 9/29 referral. She is aware of appt date and time.

## 2021-07-28 ENCOUNTER — Encounter (HOSPITAL_COMMUNITY): Payer: Self-pay | Admitting: Emergency Medicine

## 2021-07-28 ENCOUNTER — Emergency Department (HOSPITAL_COMMUNITY)
Admission: EM | Admit: 2021-07-28 | Discharge: 2021-07-28 | Disposition: A | Discharge status: Home / Self Care | Payer: BC Managed Care – PPO | Attending: Emergency Medicine | Admitting: Emergency Medicine

## 2021-07-28 ENCOUNTER — Emergency Department (HOSPITAL_COMMUNITY): Payer: BC Managed Care – PPO

## 2021-07-28 ENCOUNTER — Other Ambulatory Visit: Payer: Self-pay

## 2021-07-28 DIAGNOSIS — R42 Dizziness and giddiness: Secondary | ICD-10-CM | POA: Diagnosis not present

## 2021-07-28 DIAGNOSIS — Z8616 Personal history of COVID-19: Secondary | ICD-10-CM | POA: Insufficient documentation

## 2021-07-28 DIAGNOSIS — R531 Weakness: Secondary | ICD-10-CM | POA: Diagnosis not present

## 2021-07-28 DIAGNOSIS — I10 Essential (primary) hypertension: Secondary | ICD-10-CM | POA: Diagnosis not present

## 2021-07-28 DIAGNOSIS — Z859 Personal history of malignant neoplasm, unspecified: Secondary | ICD-10-CM | POA: Insufficient documentation

## 2021-07-28 DIAGNOSIS — Z79899 Other long term (current) drug therapy: Secondary | ICD-10-CM | POA: Diagnosis not present

## 2021-07-28 DIAGNOSIS — R251 Tremor, unspecified: Secondary | ICD-10-CM | POA: Diagnosis not present

## 2021-07-28 DIAGNOSIS — Z87891 Personal history of nicotine dependence: Secondary | ICD-10-CM | POA: Diagnosis not present

## 2021-07-28 DIAGNOSIS — R569 Unspecified convulsions: Secondary | ICD-10-CM | POA: Diagnosis not present

## 2021-07-28 DIAGNOSIS — R0902 Hypoxemia: Secondary | ICD-10-CM | POA: Diagnosis not present

## 2021-07-28 HISTORY — DX: Malignant (primary) neoplasm, unspecified: C80.1

## 2021-07-28 LAB — CBC WITH DIFFERENTIAL/PLATELET
Abs Immature Granulocytes: 0.02 10*3/uL (ref 0.00–0.07)
Basophils Absolute: 0.1 10*3/uL (ref 0.0–0.1)
Basophils Relative: 1 %
Eosinophils Absolute: 0.3 10*3/uL (ref 0.0–0.5)
Eosinophils Relative: 4 %
HCT: 44 % (ref 39.0–52.0)
Hemoglobin: 14.3 g/dL (ref 13.0–17.0)
Immature Granulocytes: 0 %
Lymphocytes Relative: 19 %
Lymphs Abs: 1.5 10*3/uL (ref 0.7–4.0)
MCH: 30 pg (ref 26.0–34.0)
MCHC: 32.5 g/dL (ref 30.0–36.0)
MCV: 92.2 fL (ref 80.0–100.0)
Monocytes Absolute: 0.8 10*3/uL (ref 0.1–1.0)
Monocytes Relative: 10 %
Neutro Abs: 5.2 10*3/uL (ref 1.7–7.7)
Neutrophils Relative %: 66 %
Platelets: 278 10*3/uL (ref 150–400)
RBC: 4.77 MIL/uL (ref 4.22–5.81)
RDW: 14.7 % (ref 11.5–15.5)
WBC: 7.9 10*3/uL (ref 4.0–10.5)
nRBC: 0 % (ref 0.0–0.2)

## 2021-07-28 LAB — COMPREHENSIVE METABOLIC PANEL
ALT: 29 U/L (ref 0–44)
AST: 27 U/L (ref 15–41)
Albumin: 3.6 g/dL (ref 3.5–5.0)
Alkaline Phosphatase: 61 U/L (ref 38–126)
Anion gap: 8 (ref 5–15)
BUN: 11 mg/dL (ref 8–23)
CO2: 26 mmol/L (ref 22–32)
Calcium: 9.2 mg/dL (ref 8.9–10.3)
Chloride: 100 mmol/L (ref 98–111)
Creatinine, Ser: 1.1 mg/dL (ref 0.61–1.24)
GFR, Estimated: >60 mL/min (ref >60)
Glucose, Bld: 120 mg/dL — ABNORMAL HIGH (ref 70–99)
Potassium: 3.8 mmol/L (ref 3.5–5.1)
Sodium: 134 mmol/L — ABNORMAL LOW (ref 135–145)
Total Bilirubin: 0.8 mg/dL (ref 0.3–1.2)
Total Protein: 7.5 g/dL (ref 6.5–8.1)

## 2021-07-28 NOTE — ED Provider Notes (Signed)
[facility] EMERGENCY DEPARTMENT Provider Note   CSN: [id number] Arrival date & time: 07/28/21  1328     History No chief complaint on file.   Joe [name] is a 65 y.o. male.  HPI Patient presents for evaluation of shaking, felt to be seizure.  He was at a hospital in [place] last week and evaluated for syncope and found to have elevated carbon dioxide level.  He has a history of sleep apnea but does not use CPAP.  Today while at home he was sitting in a recliner when he had a sudden shaking episode that lasted about 15 seconds.  He then awoke a short time later.  Family members captured this on a camera that visualizes the room, all of the time.  They showed me clips on his cell phone during which she had nonspecific appearing bilateral upper extremity movement and turned his head to the right.  The patient does not recall this episode and does not feel like he injured himself during this activity.  He saw ENT, on 07/11/2021 for evaluation of a neck mass.  Dr. Redmond Baseman was concerned about an aggressive malignant neoplasm a fine-needle aspiration was performed, family was told that he had lymphoma.  He has not had any recent fever, chills, cough, focal weakness or paresthesia.  No prior seizure history.  There are no other known active modifying factors.    Past Medical History:  Diagnosis Date   Anxiety    Arthritis    Cancer ([place])    COVID    x 2 (2020 and 2022)   Depression    Hypertension     There are no problems to display for this patient.   Past Surgical History:  Procedure Laterality Date   carpel tunnel     MASS BIOPSY Left 07/14/2021   Procedure: NECK MASS BIOPSY;  Surgeon: Melida Quitter, MD;  Location: [facility] OR;  Service: ENT;  Laterality: Left;       Family History  Problem Relation Age of Onset   Healthy Mother    Healthy Father     Social History   Tobacco Use   Smoking status: Former    Types: Cigarettes   Smokeless tobacco: Former     Types: Chew  Substance Use Topics   Alcohol use: Yes    Comment: rare   Drug use: No    Home Medications Prior to Admission medications   Medication Sig Start Date End Date Taking? Authorizing Provider  albuterol (VENTOLIN HFA) 108 (90 Base) MCG/ACT inhaler Inhale 2 puffs into the lungs every 6 (six) hours as needed for wheezing or shortness of breath.  Patient not taking: Reported on 07/09/2021 05/17/19 05/16/20  [provider]  allopurinol (ZYLOPRIM) 300 MG tablet TAKE 1 TABLET BY MOUTH EVERY DAY 01/15/21   Sanjuana Kava, MD  amLODipine (NORVASC) 10 MG tablet Take 10 mg by mouth daily. 04/26/20   [provider]  amoxicillin-clavulanate (AUGMENTIN) 875-125 MG tablet Take 1 tablet by mouth 2 (two) times daily. 07/03/21   [provider]  buPROPion (WELLBUTRIN SR) 150 MG 12 hr tablet Take 150 mg by mouth 2 (two) times daily. Patient not taking: No sig reported 06/28/21   [provider]  buPROPion (WELLBUTRIN XL) 150 MG 24 hr tablet Take 150 mg by mouth 2 (two) times daily.    [provider]  LORazepam (ATIVAN) 0.5 MG tablet Take 0.5 mg by mouth daily as needed for anxiety or sleep.  [provider]  losartan (COZAAR) 100 MG tablet Take 100 mg by mouth daily. 05/09/20   [provider]  naproxen (NAPROSYN) 500 MG tablet TAKE 1 TABLET BY MOUTH TWICE DAILY WITH A MEAL Patient not taking: No sig reported 05/28/21   Carole Civil, MD  naproxen sodium (ALEVE) 220 MG tablet Take 440 mg by mouth daily as needed (pain). Patient not taking: Reported on 07/09/2021    [provider]  olmesartan-hydrochlorothiazide (BENICAR HCT) 40-12.5 MG tablet Take 1 tablet by mouth daily.    [provider]  predniSONE (DELTASONE) 20 MG tablet Take 2 tablets (40 mg total) by mouth daily. Patient not taking: No sig reported 06/08/19   Davonna Belling, MD  sertraline (ZOLOFT) 50 MG tablet Take 50 mg by mouth daily.    [provider]  simvastatin (ZOCOR) 40 MG tablet Take 40 mg by mouth 3 (three) times a week. Patient not taking: No sig reported 06/26/21   [provider]    Allergies    Patient has no known allergies.  Review of Systems   Review of Systems  All other systems reviewed and are negative.  Physical Exam Updated Vital Signs BP (!) 150/79   Pulse 80   Temp 98.8 F (37.1 C)   Resp 19   SpO2 95%   Physical Exam Vitals and nursing note reviewed.  Constitutional:      General: He is not in acute distress.    Appearance: He is well-developed. He is obese. He is not ill-appearing, toxic-appearing or diaphoretic.  HENT:     Head: Normocephalic and atraumatic.     Right Ear: External ear normal.     Left Ear: External ear normal.  Eyes:     Conjunctiva/sclera: Conjunctivae normal.     Pupils: Pupils are equal, round, and reactive to light.  Neck:     Trachea: Phonation normal.  Cardiovascular:     Rate and Rhythm: Normal rate and regular rhythm.     Heart sounds: Normal heart sounds.  Pulmonary:     Effort: Pulmonary effort is normal.     Breath sounds: Normal breath sounds.  Abdominal:     Palpations: Abdomen is soft.     Tenderness: There is no abdominal tenderness.  Musculoskeletal:        General: Normal range of motion.     Cervical back: Normal range of motion and neck supple.  Skin:    General: Skin is warm and dry.  Neurological:     Mental Status: He is alert and oriented to person, place, and time.     Cranial Nerves: No cranial nerve deficit.     Sensory: No sensory deficit.     Motor: No abnormal muscle tone.     Coordination: Coordination normal.  Psychiatric:        Behavior: Behavior normal.        Thought Content: Thought content normal.        Judgment: Judgment normal.    ED Results / Procedures / Treatments   Labs (all labs ordered are listed, but only abnormal results are displayed) Labs Reviewed  COMPREHENSIVE METABOLIC PANEL -  Abnormal; Notable for the following components:      Result Value   Sodium 134 (*)    Glucose, Bld 120 (*)    All other components within normal limits  CBC WITH DIFFERENTIAL/PLATELET  URINALYSIS, ROUTINE W REFLEX MICROSCOPIC    EKG EKG Interpretation  Date/Time:  Saturday July 28 2021 14:00:58 EDT Ventricular Rate:  101 PR Interval:  186 QRS Duration: 104 QT Interval:  358 QTC Calculation: 464 R Axis:   267 Text Interpretation: Sinus tachycardia Right superior axis deviation Pulmonary disease pattern Incomplete right bundle branch block Right ventricular hypertrophy Abnormal ECG Since last tracing rate faster Otherwise no significant change Confirmed by Daleen Bo [phone]) on 07/28/2021 7:56:29 PM  Radiology DG Chest 2 View  Result Date: 07/28/2021 CLINICAL DATA:  Seizure-like activity. EXAM: CHEST - 2 VIEW COMPARISON:  June 08, 2019 FINDINGS: The heart size and mediastinal contours are within normal limits. Both lungs are clear. The visualized skeletal structures are unremarkable. IMPRESSION: No active cardiopulmonary disease. Electronically Signed   By: Fidela Salisbury M.D.   On: 07/28/2021 14:51    Procedures Procedures   Medications Ordered in ED Medications - No data to display  ED Course  I have reviewed the triage vital signs and the nursing notes.  Pertinent labs & imaging results that were available during my care of the patient were reviewed by me and considered in my medical decision making (see chart for details).    MDM Rules/Calculators/A&P                            Patient Vitals for the past 24 hrs:  BP Temp Pulse Resp SpO2  07/28/21 1930 (!) 150/79 -- 80 19 95 %  07/28/21 1915 (!) 145/77 -- 87 20 95 %  07/28/21 1900 140/75 -- 81 (!) 21 95 %  07/28/21 1800 (!) 142/81 -- 78 17 97 %  07/28/21 1358 (!) 159/100 98.8 F (37.1 C) 92 17 96 %    7:36 PM Reevaluation with update and discussion. After initial assessment and treatment, an updated  evaluation reveals clearance Janora Norlander has no further complaints.  Patient needs to evaluate the patient does not have a firm diagnosis for sleep apnea and that he has a sleep study pending in 3 days time.  He plans on seeing the oncologist, in 2 days.  I discussed the findings and answered all the questions.Daleen Bo   Medical Decision Making:  This patient is presenting for evaluation of possible seizure, which does require a range of treatment options, and is a complaint that involves a high risk of morbidity and mortality. The differential diagnoses include seizure, shaking episode, syncope. I decided to review old records, and in summary elderly male, with recent diagnosis of hypercapnia, treated in the ED setting, and discharged.  He is chronically debilitated.  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  He will see oncology next week.  He has a history of sleep apnea. I obtained additional historical information from his niece at the bedside.  Clinical Laboratory Tests Ordered, included CBC and Metabolic panel. Review indicates normal. Radiologic Tests Ordered, included CT head, chest x-ray.  I independently Visualized: Radiograph images, which show no acute abnormalities    Critical Interventions-clinical evaluation, laboratory testing, radiography, observation and reassessment.  After These Interventions, the Patient was reevaluated and was found stable for discharge.  Patient with unclear episode of shaking, very short duration, with reassuring evaluation in the ED.  Possibly related to sleep apnea, with myoclonic jerking.  No overt toxicity at this time.  Recent diagnosis of lymphoma with plans to see oncology in 2 days and have a sleep apnea study in 3 days.  He is  not currently taking CPAP.  He is stable for discharge with outpatient management.  CRITICAL CARE-no Performed by: Daleen Bo  Nursing Notes Reviewed/  Care Coordinated Applicable Imaging Reviewed Interpretation of Laboratory Data incorporated into ED treatment  The patient appears reasonably screened and/or stabilized for discharge and I doubt any other medical condition or other [facility] requiring further screening, evaluation, or treatment in the ED at this time prior to discharge.  Plan: Home Medications-continue usual; Home Treatments-rest, fluids; return here if the recommended treatment, does not improve the symptoms; Recommended follow up-PCP, as needed.      Final Clinical Impression(s) / ED Diagnoses Final diagnoses:  Episode of shaking    Rx / DC Orders ED Discharge Orders     None        Daleen Bo, MD 07/29/21 [phone]

## 2021-07-28 NOTE — Discharge Instructions (Signed)
The episode that he had today is not clearly a seizure.  It is important to follow-up with a neurologist, for checkup on that.  You need to also continue the evaluations currently planned, with oncology on Monday, and the sleep study on Tuesday.  Make sure you are getting plenty of rest, drink a lot of fluids, eating regularly and taking all of your medicines as prescribed.

## 2021-07-28 NOTE — ED Triage Notes (Signed)
Pt states his sister witnessed him have seizure like activity just PTA.  Denies history of seizures.  Pt from [place] and seen in ED in [place] last week after syncopal event due to elevated carbon monoxide.  Pt has abrasion to forehead.  Denies incontinence.  Denies any complaints at this time.  States he feels fine.

## 2021-07-28 NOTE — ED Provider Notes (Signed)
Emergency Medicine Provider Triage Evaluation Note  Joe [name] , a 65 y.o. male  was evaluated in triage.  Pt complains of seizure-like activity that occurred prior to arrival lasting about 20 minutes.  This was witnessed by his sister who report his eyes rolled to the back of his head.  Had a similar episode previously on Monday, where he was told "he was out for like 4 hours ", he is currently on no phylactic medication for seizure control.  Review of Systems  Positive: Left throat pain, sob Negative: Chest pain, headache  Physical Exam  BP (!) 159/100 (BP Location: Left Arm)   Pulse 92   Temp 98.8 F (37.1 C)   Resp 17   SpO2 96%  Gen:   Awake, no distress   Resp:  Normal effort  MSK:   Moves extremities without difficulty  Other:  Visible left neck swelling, consistent with his prior history of throat cancer.  Lungs are diminished to auscultation, abdomen is distended.  Still upper and lower extremities.  Medical Decision Making  Medically screening exam initiated at 1:59 PM.  Appropriate orders placed.  Joe [name] was informed that the remainder of the evaluation will be completed by another provider, this initial triage assessment does not replace that evaluation, and the importance of remaining in the ED until their evaluation is complete.  Patient here with seizure-like activity that occurred prior to arrival lasting approximately 20 minutes, no prior history of seizures.  Is requesting family to get further collateral information, however none present at the time.  Screening labs have been ordered.  He is neurologically intact at the moment. For recent admission to hospital, was told that his throat cancer was likely impeding on his airway, and he had high levels of monoxide, which was likely causing his seizure-like activity.   Joe Fitting, PA-C 07/28/21 1405    [name], MD 07/28/21 1606

## 2021-07-30 ENCOUNTER — Inpatient Hospital Stay: Payer: BC Managed Care – PPO | Attending: Hematology | Admitting: Hematology

## 2021-07-30 ENCOUNTER — Inpatient Hospital Stay: Payer: BC Managed Care – PPO

## 2021-07-30 ENCOUNTER — Other Ambulatory Visit: Payer: Self-pay

## 2021-07-30 VITALS — BP 158/93 | HR 89 | Temp 97.8°F | Resp 19 | Ht 69.0 in | Wt 329.2 lb

## 2021-07-30 DIAGNOSIS — Z79899 Other long term (current) drug therapy: Secondary | ICD-10-CM | POA: Diagnosis not present

## 2021-07-30 DIAGNOSIS — C8221 Follicular lymphoma grade III, unspecified, lymph nodes of head, face, and neck: Secondary | ICD-10-CM | POA: Diagnosis not present

## 2021-07-30 DIAGNOSIS — Z6841 Body Mass Index (BMI) 40.0 and over, adult: Secondary | ICD-10-CM | POA: Diagnosis not present

## 2021-07-30 DIAGNOSIS — I82442 Acute embolism and thrombosis of left tibial vein: Secondary | ICD-10-CM | POA: Diagnosis not present

## 2021-07-30 DIAGNOSIS — I10 Essential (primary) hypertension: Secondary | ICD-10-CM | POA: Diagnosis not present

## 2021-07-30 DIAGNOSIS — R61 Generalized hyperhidrosis: Secondary | ICD-10-CM | POA: Diagnosis not present

## 2021-07-30 DIAGNOSIS — R600 Localized edema: Secondary | ICD-10-CM | POA: Diagnosis not present

## 2021-07-30 DIAGNOSIS — I2693 Single subsegmental pulmonary embolism without acute cor pulmonale: Secondary | ICD-10-CM | POA: Diagnosis not present

## 2021-07-30 DIAGNOSIS — M109 Gout, unspecified: Secondary | ICD-10-CM | POA: Diagnosis not present

## 2021-07-30 DIAGNOSIS — E782 Mixed hyperlipidemia: Secondary | ICD-10-CM | POA: Diagnosis not present

## 2021-07-30 DIAGNOSIS — C8291 Follicular lymphoma, unspecified, lymph nodes of head, face, and neck: Secondary | ICD-10-CM | POA: Diagnosis not present

## 2021-07-30 DIAGNOSIS — R55 Syncope and collapse: Secondary | ICD-10-CM | POA: Diagnosis not present

## 2021-07-30 DIAGNOSIS — Z87891 Personal history of nicotine dependence: Secondary | ICD-10-CM | POA: Diagnosis not present

## 2021-07-30 DIAGNOSIS — R062 Wheezing: Secondary | ICD-10-CM | POA: Diagnosis not present

## 2021-07-30 DIAGNOSIS — I2609 Other pulmonary embolism with acute cor pulmonale: Secondary | ICD-10-CM | POA: Diagnosis not present

## 2021-07-30 DIAGNOSIS — F32A Depression, unspecified: Secondary | ICD-10-CM | POA: Diagnosis not present

## 2021-07-30 DIAGNOSIS — Z8616 Personal history of COVID-19: Secondary | ICD-10-CM | POA: Diagnosis not present

## 2021-07-30 DIAGNOSIS — Z20822 Contact with and (suspected) exposure to covid-19: Secondary | ICD-10-CM | POA: Diagnosis not present

## 2021-07-30 DIAGNOSIS — I4510 Unspecified right bundle-branch block: Secondary | ICD-10-CM | POA: Diagnosis not present

## 2021-07-30 DIAGNOSIS — I82432 Acute embolism and thrombosis of left popliteal vein: Secondary | ICD-10-CM | POA: Diagnosis not present

## 2021-07-30 DIAGNOSIS — I82412 Acute embolism and thrombosis of left femoral vein: Secondary | ICD-10-CM | POA: Diagnosis not present

## 2021-07-30 DIAGNOSIS — I959 Hypotension, unspecified: Secondary | ICD-10-CM | POA: Diagnosis not present

## 2021-07-30 DIAGNOSIS — G4733 Obstructive sleep apnea (adult) (pediatric): Secondary | ICD-10-CM | POA: Diagnosis not present

## 2021-07-30 DIAGNOSIS — C8201 Follicular lymphoma grade I, lymph nodes of head, face, and neck: Secondary | ICD-10-CM | POA: Diagnosis not present

## 2021-07-30 DIAGNOSIS — R0689 Other abnormalities of breathing: Secondary | ICD-10-CM | POA: Diagnosis not present

## 2021-07-30 DIAGNOSIS — I2699 Other pulmonary embolism without acute cor pulmonale: Secondary | ICD-10-CM | POA: Diagnosis not present

## 2021-07-30 LAB — CBC WITH DIFFERENTIAL/PLATELET
Abs Immature Granulocytes: 0.02 10*3/uL (ref 0.00–0.07)
Basophils Absolute: 0.1 10*3/uL (ref 0.0–0.1)
Basophils Relative: 1 %
Eosinophils Absolute: 0.3 10*3/uL (ref 0.0–0.5)
Eosinophils Relative: 4 %
HCT: 42.8 % (ref 39.0–52.0)
Hemoglobin: 14.3 g/dL (ref 13.0–17.0)
Immature Granulocytes: 0 %
Lymphocytes Relative: 20 %
Lymphs Abs: 1.8 10*3/uL (ref 0.7–4.0)
MCH: 29.9 pg (ref 26.0–34.0)
MCHC: 33.4 g/dL (ref 30.0–36.0)
MCV: 89.4 fL (ref 80.0–100.0)
Monocytes Absolute: 0.9 10*3/uL (ref 0.1–1.0)
Monocytes Relative: 10 %
Neutro Abs: 5.7 10*3/uL (ref 1.7–7.7)
Neutrophils Relative %: 65 %
Platelets: 284 10*3/uL (ref 150–400)
RBC: 4.79 MIL/uL (ref 4.22–5.81)
RDW: 14.6 % (ref 11.5–15.5)
WBC: 8.7 10*3/uL (ref 4.0–10.5)
nRBC: 0 % (ref 0.0–0.2)

## 2021-07-30 LAB — CMP (CANCER CENTER ONLY)
ALT: 33 U/L (ref 0–44)
AST: 26 U/L (ref 15–41)
Albumin: 3.9 g/dL (ref 3.5–5.0)
Alkaline Phosphatase: 72 U/L (ref 38–126)
Anion gap: 12 (ref 5–15)
BUN: 13 mg/dL (ref 8–23)
CO2: 24 mmol/L (ref 22–32)
Calcium: 9.7 mg/dL (ref 8.9–10.3)
Chloride: 102 mmol/L (ref 98–111)
Creatinine: 1.27 mg/dL — ABNORMAL HIGH (ref 0.61–1.24)
GFR, Estimated: >60 mL/min (ref >60)
Glucose, Bld: 96 mg/dL (ref 70–99)
Potassium: 4 mmol/L (ref 3.5–5.1)
Sodium: 138 mmol/L (ref 135–145)
Total Bilirubin: 0.6 mg/dL (ref 0.3–1.2)
Total Protein: 8.2 g/dL — ABNORMAL HIGH (ref 6.5–8.1)

## 2021-07-30 LAB — HIV ANTIBODY (ROUTINE TESTING W REFLEX): HIV Screen 4th Generation wRfx: NONREACTIVE

## 2021-07-30 LAB — LACTATE DEHYDROGENASE: LDH: 337 U/L — ABNORMAL HIGH (ref 98–192)

## 2021-07-30 LAB — HEPATITIS B CORE ANTIBODY, TOTAL: Hep B Core Total Ab: NONREACTIVE

## 2021-07-30 LAB — URIC ACID: Uric Acid, Serum: 6.3 mg/dL (ref 3.7–8.6)

## 2021-07-30 LAB — HEPATITIS B SURFACE ANTIGEN: Hepatitis B Surface Ag: NONREACTIVE

## 2021-07-30 NOTE — Patient Instructions (Signed)
Thank you for choosing [facility] to provide your care.   Should you have questions after your visit to the [facility] ([facility]), please contact this office at [phone] between 8:30 AM and 4:30 PM.  Voice mails left after 4:00 PM may not be returned until the following business day.  Calls received after 4:30 PM will be answered by an off-site Nurse Triage Line.    Prescription Refills:  Please have your pharmacy contact us directly for most prescription requests.  Contact the office directly for refills of narcotics (pain medications). Allow 48-72 hours for refills.  Appointments: Please contact the [facility] scheduling department [phone] for questions regarding [facility] appointment scheduling.  Contact the schedulers with any scheduling changes so that your appointment can be rescheduled in a timely manner.   Central Scheduling for [place] [phone] - Call to schedule procedures such as PET scans, CT scans, MRI, Ultrasound, etc.  To afford each patient quality time with our providers, please arrive 30 minutes before your scheduled appointment time.  If you arrive late for your appointment, you may be asked to reschedule.  We strive to give you quality time with our providers, and arriving late affects you and other patients whose appointments are after yours. If you are a no show for multiple scheduled visits, you may be dismissed from the clinic at the providers discretion.     Resources: [facility] Social Workers [phone] for additional information on assistance programs or assistance connecting with community support programs   [place] DSS  [phone]: Information regarding food stamps, Medicaid, and utility assistance [place] [employer] [place] [phone]   [place] [employer]'s shared-ride transportation service for eligible riders who have a disability that prevents them from riding the fixed route bus.   Medicare Rights Center 800-333-4114  Helps people with Medicare understand their rights and benefits, navigate the Medicare system, and secure the quality healthcare they deserve [employer] 800-227-2345 Assists patients locate various types of support and financial assistance Cancer Care: 1-800-813-HOPE (4673) Provides financial assistance, online support groups, medication/co-pay assistance.   Transportation Assistance for appointments at [facility]: Transportation Coordinator [phone]  Again, thank you for choosing [facility] for your care.       

## 2021-07-30 NOTE — Progress Notes (Signed)
[name]   HEMATOLOGY/ONCOLOGY CONSULTATION NOTE  Date of Service: 07/30/2021  Patient Care Team: Celene Squibb, MD as PCP - General (Internal Medicine)  CHIEF COMPLAINTS/PURPOSE OF CONSULTATION:  Left neck mass/newly diagnosed non-Hodgkin's lymphoma.  HISTORY OF PRESENTING ILLNESS:  Joe [name] is a wonderful 65 y.o. male who has been referred to [place] by Dr .[place] Crane, Edwinna Areola, MD for evaluation and management of newly diagnosed non-Hodgkin's lymphoma.  Patient has a history of morbid obesity (.Body mass index is 48.61 kg/m.),  Sleep apnea not currently on CPAP ,arthritis, anxiety, hypertension, depression and first presented to the emergency room on 07/09/2021 with newly noted left-sided neck mass which showed up over a week.  He initially went to urgent care and was treated with Augmentin for possible infectious etiology but came to the emergency room when his left neck mass continued to get larger and more uncomfortable.  He notes some difficulty with breathing also at times feels like it is a little more difficult to swallow. Patient had a CT soft tissue of the neck with contrast on 07/09/2021 which showed 1. Large heterogeneous soft tissue mass measuring 7.9 x 6.1 by 8.2 cm in the left neck, extending from the skull base/parapharyngeal space inferiorly to the supraclavicular fossa, located immediately subjacent to and inseparable from the left sternocleidomastoid. The left internal jugular vein is obliterated by the mass and left carotid is inseparable from the mass, poorly visualized. Findings could represent malignancy or infectious phlegmon. Soft tissue sampling could provide more definitive diagnosis. 2. Nonspecific mild surrounding stranding and scattered prominent lymph nodes throughout the neck.  Patient was referred to ENT and had a surgical biopsy of the left neck mass by Dr. Melida Quitter on 07/14/2021 which showed Non-Hodgkin B-cell lymphoma, The majority of the fragments have extensive crush  artifact hampering  evaluation. One fragment is well preserved and demonstrates an atypical  lymphoid proliferation with predominately small irregular lymphocytes.  Immunohistochemistry is positive for CD20, bcl-2, bcl-6 and CD10. CD23  is largely negative. CyclinD1 and light chain in situ hybridization are  negative. Ki-67 is 20-25%. CD3 and CD5 highlight scattered T-cells. Flow  cytometry ([id number]) reveals predominately T-cells. Overall, the  findings are consistent with a non-Hodgkin B-cell lymphoma, specifically  follicular lymphoma. There is only limited preserved tissue hampering grading, but this area appears low grade (grade 1-2 of 3). A higher grade process in the crushed fragments cannot be excluded.  Patient again presented to the emergency room on 07/28/2021 with an episode of shaking reportedly some confusion to rule out seizure.  He had a CT of the head which showed no acute intracranial process.  Patient is here to discuss further evaluation and management of his newly diagnosed follicular lymphoma.  He notes some increased fatigue but no overt fevers chills night sweats or unexpected sudden weight loss. No other overt new focal symptoms other than in the neck. No new chest pain or shortness of breath. No abdominal pain, abdominal distention or change in bowel habits.  Patient notes no previous known heart issues. Has had COVID-19 in 2020 and 2022 denies any chronic lung issues. Does appear to have significant sleep apnea based on symptoms and known history of previous sleep apnea but has never been on CPAP.  His episodes of confusion difficulty waking up and difficulty waking up from sedation there is certainly a strong concern for untreated sleep apnea.  Patient and his wife notes that he has been scheduled for a sleep study.  MEDICAL HISTORY:  Past Medical History:  Diagnosis Date   Anxiety    Arthritis    Cancer ([place])    COVID    x 2 (2020 and 2022)   Depression     Hypertension     SURGICAL HISTORY: Past Surgical History:  Procedure Laterality Date   carpel tunnel     MASS BIOPSY Left 07/14/2021   Procedure: NECK MASS BIOPSY;  Surgeon: Melida Quitter, MD;  Location[facility];  Service: ENT;  Laterality: Left;    SOCIAL HISTORY: Social History   Socioeconomic History   Marital status: Widowed    Spouse name: Not on file   Number of children: Not on file   Years of education: Not on file   Highest education level: Not on file  Occupational History   Not on file  Tobacco Use   Smoking status: Former    Types: Cigarettes   Smokeless tobacco: Former    Types: Chew  Substance and Sexual Activity   Alcohol use: Yes    Comment: rare   Drug use: No   Sexual activity: Not on file  Other Topics Concern   Not on file  Social History Narrative   Not on file   Social Determinants of Health   Financial Resource Strain: Not on file  Food Insecurity: Not on file  Transportation Needs: Not on file  Physical Activity: Not on file  Stress: Not on file  Social Connections: Not on file  Intimate Partner Violence: Not on file    FAMILY HISTORY: Family History  Problem Relation Age of Onset   Healthy Mother    Healthy Father     ALLERGIES:  has No Known Allergies.  MEDICATIONS:  Current Outpatient Medications  Medication Sig Dispense Refill   albuterol (VENTOLIN HFA) 108 (90 Base) MCG/ACT inhaler Inhale 2 puffs into the lungs every 6 (six) hours as needed for wheezing or shortness of breath.  (Patient not taking: Reported on 07/09/2021)     allopurinol (ZYLOPRIM) 300 MG tablet TAKE 1 TABLET BY MOUTH EVERY DAY 30 tablet 5   amLODipine (NORVASC) 10 MG tablet Take 10 mg by mouth daily.     amoxicillin-clavulanate (AUGMENTIN) 875-125 MG tablet Take 1 tablet by mouth 2 (two) times daily.     buPROPion (WELLBUTRIN SR) 150 MG 12 hr tablet Take 150 mg by mouth 2 (two) times daily. (Patient not taking: No sig reported)     buPROPion (WELLBUTRIN XL)  150 MG 24 hr tablet Take 150 mg by mouth 2 (two) times daily.     LORazepam (ATIVAN) 0.5 MG tablet Take 0.5 mg by mouth daily as needed for anxiety or sleep.     losartan (COZAAR) 100 MG tablet Take 100 mg by mouth daily.     naproxen (NAPROSYN) 500 MG tablet TAKE 1 TABLET BY MOUTH TWICE DAILY WITH A MEAL (Patient not taking: No sig reported) 60 tablet 5   naproxen sodium (ALEVE) 220 MG tablet Take 440 mg by mouth daily as needed (pain). (Patient not taking: Reported on 07/09/2021)     olmesartan-hydrochlorothiazide (BENICAR HCT) 40-12.5 MG tablet Take 1 tablet by mouth daily.     predniSONE (DELTASONE) 20 MG tablet Take 2 tablets (40 mg total) by mouth daily. (Patient not taking: No sig reported) 8 tablet 0   sertraline (ZOLOFT) 50 MG tablet Take 50 mg by mouth daily.     simvastatin (ZOCOR) 40 MG tablet Take 40 mg by mouth 3 (three) times a week. (  Patient not taking: No sig reported)     No current facility-administered medications for this visit.    REVIEW OF SYSTEMS:    10 Point review of Systems was done is negative except as noted above.  PHYSICAL EXAMINATION: ECOG PERFORMANCE STATUS: 1 - Symptomatic but completely ambulatory  . Vitals:   07/30/21 1103  BP: (!) 158/93  Pulse: 89  Resp: 19  Temp: 97.8 F (36.6 C)  SpO2: 95%   Filed Weights   07/30/21 1103  Weight: (!) 329 lb 3.2 oz (149.3 kg)   .Body mass index is 48.61 kg/m.  GENERAL:alert, in no acute distress and comfortable SKIN: no acute rashes, no significant lesions EYES: conjunctiva are pink and non-injected, sclera anicteric OROPHARYNX: MMM, no exudates, no oropharyngeal erythema or ulceration NECK: supple, no JVD LYMPH: Large left neck palpable lymphadenopathy .  Smaller right neck lymphadenopathy . LUNGS: clear to auscultation b/l with normal respiratory effort HEART: regular rate & rhythm ABDOMEN: Obese, normoactive bowel sounds , non tender, not distended. Extremity: no pedal edema PSYCH: alert &  oriented x 3 with fluent speech NEURO: no focal motor/sensory deficits  LABORATORY DATA:  I have reviewed the data as listed  . CBC Latest Ref Rng & Units 07/28/2021 07/09/2021 06/08/2019  WBC 4.0 - 10.5 K/uL 7.9 8.9 9.7  Hemoglobin 13.0 - 17.0 g/dL 14.3 14.5 14.1  Hematocrit 39.0 - 52.0 % 44.0 43.6 42.9  Platelets 150 - 400 K/uL 278 236 292    . CMP Latest Ref Rng & Units 07/28/2021 07/09/2021 06/08/2019  Glucose 70 - 99 mg/dL 120(H) 91 109(H)  BUN 8 - 23 mg/dL '11 23 20  ' Creatinine 0.61 - 1.24 mg/dL 1.10 1.28(H) 1.58(H)  Sodium 135 - 145 mmol/L 134(L) 136 137  Potassium 3.5 - 5.1 mmol/L 3.8 4.1 4.2  Chloride 98 - 111 mmol/L 100 104 101  CO2 22 - 32 mmol/L '26 26 26  ' Calcium 8.9 - 10.3 mg/dL 9.2 8.1(L) 9.0  Total Protein 6.5 - 8.1 g/dL 7.5 - -  Total Bilirubin 0.3 - 1.2 mg/dL 0.8 - -  Alkaline Phos 38 - 126 U/L 61 - -  AST 15 - 41 U/L 27 - -  ALT 0 - 44 U/L 29 - -   SURGICAL PATHOLOGY  CASE: [id number]  PATIENT: Filipe Grizzell  Surgical Pathology Report   Clinical History: neck mass (cm)      FINAL MICROSCOPIC DIAGNOSIS:   A. SOFT TISSUE MASS, LEFT NECK, BIOPSY:  - Non-Hodgkin B-cell lymphoma, see comment.   COMMENT:   The majority of the fragments have extensive crush artifact hampering  evaluation. One fragment is well preserved and demonstrates an atypical  lymphoid proliferation with predominately small irregular lymphocytes.  Immunohistochemistry is positive for CD20, bcl-2, bcl-6 and CD10. CD23  is largely negative. CyclinD1 and light chain in situ hybridization are  negative. Ki-67 is 20-25%. CD3 and CD5 highlight scattered T-cells. Flow  cytometry ([id number]) reveals predominately T-cells. Overall, the  findings are consistent with a non-Hodgkin B-cell lymphoma, specifically  follicular lymphoma. There is only limited preserved tissue hampering  grading, but this area appears low grade (grade 1-2 of 3). A higher  grade process in the crushed fragments  cannot be excluded. Clinical  correlation is recommended. Dr. Redmond Baseman was attempted on 07/18/2021.   RADIOGRAPHIC STUDIES: I have personally reviewed the radiological images as listed and agreed with the findings in the report. DG Chest 2 View  Result Date: 07/28/2021 CLINICAL DATA:  Seizure-like activity. EXAM: CHEST -  2 VIEW COMPARISON:  June 08, 2019 FINDINGS: The heart size and mediastinal contours are within normal limits. Both lungs are clear. The visualized skeletal structures are unremarkable. IMPRESSION: No active cardiopulmonary disease. Electronically Signed   By: Fidela Salisbury M.D.   On: 07/28/2021 14:51   CT Head Wo Contrast  Result Date: 07/28/2021 CLINICAL DATA:  Seizure activity, frontal scalp abrasion EXAM: CT HEAD WITHOUT CONTRAST TECHNIQUE: Contiguous axial images were obtained from the base of the skull through the vertex without intravenous contrast. COMPARISON:  None. FINDINGS: Brain: No acute infarct or hemorrhage. Lateral ventricles and midline structures are unremarkable. No acute extra-axial fluid collections. No mass effect. Vascular: No hyperdense vessel or unexpected calcification. Skull: Normal. Negative for fracture or focal lesion. Sinuses/Orbits: Mild mucosal thickening within the maxillary sinuses. Remaining paranasal sinuses are clear. Other: None. IMPRESSION: 1. No acute intracranial process. Electronically Signed   By: Randa Ngo M.D.   On: 07/28/2021 18:39   CT Soft Tissue Neck W Contrast  Result Date: 07/09/2021 CLINICAL DATA:  Neck abscess, deep tissue EXAM: CT NECK WITH CONTRAST TECHNIQUE: Multidetector CT imaging of the neck was performed using the standard protocol following the bolus administration of intravenous contrast. CONTRAST:  65m OMNIPAQUE IOHEXOL 350 MG/ML SOLN COMPARISON:  None. FINDINGS: Pharynx and larynx: The large left neck mass described below involves the left parapharyngeal space, exerting mild mass effect on the or pharyngeal  airway. Tonsillar hypertrophy. Salivary glands: No inflammation, mass, or stone. The mass described below exerts mass effect on the left submandibular gland, which is displaced anteriorly Thyroid: Normal. Lymph nodes: Scattered prominent lymph nodes in the submandibular region bilaterally and upper cervical chain Vascular: The large left neck mass is inseparable from the left carotid and internal jugular vein, which is not well visualized. Limited evaluation of patency due to non arterial timing. Limited intracranial: No obvious acute intracranial abnormality in the visualized brain. Visualized orbits: Visualized orbits are unremarkable. Mastoids and visualized paranasal sinuses: Bilateral maxillary sinus mucosal thickening with air-fluid level on the left. Skeleton: Degenerative change. Upper chest: Visualized lung apices are clear. Other: Large heterogeneous soft tissue mass within the left neck which measures up to proximally 7.9 x 6.1 by 8.2 cm. The mass is subjacent to and inseparable from the left sternocleidomastoid and extends from the skull base/parapharyngeal space inferiorly to the supraclavicular fossa. The mass obliterates the left internal jugular vein and exerts mild mass effect on the airway in the neck. In the mid neck, the mass abuts the cricoid cartilage on the left and the left strap musculature. In the lower neck, the mass abuts the left thyroid. IMPRESSION: 1. Large heterogeneous soft tissue mass in the left neck, extending from the skull base/parapharyngeal space inferiorly to the supraclavicular fossa, located immediately subjacent to and inseparable from the left sternocleidomastoid. The left internal jugular vein is obliterated by the mass and left carotid is inseparable from the mass, poorly visualized. Findings could represent malignancy or infectious phlegmon. The rapid development (patient reportedly only noticed the swelling for the past week) favors infection. Soft tissue sampling  could provide more definitive diagnosis. 2. Nonspecific mild surrounding stranding and scattered prominent lymph nodes throughout the neck. Findings discussed with Dr. HAlmyra Freevia telephone at 6:22 p.m. Electronically Signed   By: FMargaretha SheffieldM.D.   On: 07/09/2021 18:41    ASSESSMENT & PLAN:   65year old male with   1) newly diagnosed bulky left cervical follicular lymphoma. Based on limited sampling the patient's pathology reveals a  grade 1 through 2 follicular lymphoma with a Ki-67 of 25%.  There was a lot of crush artifact in the sample and therefore a high-grade process could not be ruled out. However the predominant element of his mass has grown significantly over the last 1 month which is concerning for a high-grade follicular lymphoma or follicular lymphoma with transformation to large B-cell lymphoma.  CT neck imaging shows concern with compression of left internal jugular vein with mild mass-effect on the airway in the neck and the left carotid is inseparable from the mass.  High risk of syncopal episodes if there is compression of the common carotid baroreceptors.  2) HTN  3) Obstructive sleep apnea not on CPAP machine.  This is causing sleep issues and possible cognitive issues.  4) Depression and anxiety PLAN -Patient was accompanied by his sister with whom he lives and his niece who helps him with a lot of his medical cares. -We discussed his available imaging studies and pathology results showing newly diagnosed follicular lymphoma. -We discussed the pathology showing grade 1 through 2 follicular lymphoma however the behavior of his tumor is more suggestive of a high-grade process which is likely not being captured on the biopsy result.  This is a massive lesion that has grown out and 1 to 1-1/2 months.  I index of suspicion for high-grade follicular lymphoma or transformation to high-grade lymphoma. -We discussed consideration of repeat biopsy however the patient is quite  symptomatic from compression of his internal jugular carotid artery as well as to some degree his airway and is keen to get started on treatment soon. -We discussed avoiding any tight collars or straps or any neck positions that will cause further compression of his airway or blood vessels in the neck. -We will start him on empiric prednisone pending additional work-up and starting treatment to try to shrink this mass. -PET CT scan for initial staging of his high-grade follicular lymphoma versus further lymphoma with transformation. -Echocardiogram to evaluate heart function. -Patient notes he is already scheduled for a sleep study to try to get a CPAP machine given his severe sleep apnea that is not currently being treated and could be responsible for his confusional states and passing out episodes. -Discussed different treatment approaches and recommended starting the patient on R-CHOP for high-grade follicular lymphoma versus further lymphoma with large cell transformation. -Port-A-Cath placement -Chemotherapy counseling for R-CHOP with G-CSF. -  Followup  Labs today PET/CT in 1 week Port-A-Cath placement in 1 week Echo in 1 week Chemo counseling for R-CHOP in 1 week Please schedule the patient to start R-CHOP with port flush and labs and MD visit in 10-12 days   . Orders Placed This Encounter  Procedures   NM PET Image Initial (PI) Skull Base To Thigh    Standing Status:   Future    Standing Expiration Date:   07/30/2022    Order Specific Question:   If indicated for the ordered procedure, I authorize the administration of a radiopharmaceutical per Radiology protocol    Answer:   Yes    Order Specific Question:   Preferred imaging location?    Answer:   [place]   IR IMAGING GUIDED PORT INSERTION    Standing Status:   Future    Standing Expiration Date:   07/30/2022    Order Specific Question:   Reason for Exam (SYMPTOM  OR DIAGNOSIS REQUIRED)    Answer:   Port-A-Cath  placement for IV chemotherapy for newly diagnosed high-grade follicular  lymphoma    Order Specific Question:   Preferred Imaging Location?    Answer:   [facility]   CBC with Differential/Platelet    Standing Status:   Future    Number of Occurrences:   1    Standing Expiration Date:   07/30/2022   CMP (Cancer Center only)    Standing Status:   Future    Number of Occurrences:   1    Standing Expiration Date:   07/30/2022   Lactate dehydrogenase    Standing Status:   Future    Number of Occurrences:   1    Standing Expiration Date:   07/30/2022   Hepatitis B surface antigen    Standing Status:   Future    Number of Occurrences:   1    Standing Expiration Date:   07/30/2022   Hepatitis B core antibody, total    Standing Status:   Future    Number of Occurrences:   1    Standing Expiration Date:   07/30/2022   Hepatitis C antibody    Standing Status:   Future    Number of Occurrences:   1    Standing Expiration Date:   07/30/2022   HIV Antibody (routine testing w rflx)    Standing Status:   Future    Number of Occurrences:   1    Standing Expiration Date:   07/30/2022   Uric acid    Standing Status:   Future    Number of Occurrences:   1    Standing Expiration Date:   07/30/2022     All of the patients questions were answered with apparent satisfaction. The patient knows to call the clinic with any problems, questions or concerns.  I spent 60 minutes counseling the patient face to face. The total time spent in the appointment was 80 minutes and more than 50% was on counseling and direct patient cares.    Sullivan Lone MD [place] AAHIVMS [facility] Hematology/Oncology Physician [facility]  (Office):       [phone] (Work cell):  [phone] (Fax):           [phone]  07/30/2021 11:26 AM

## 2021-07-31 ENCOUNTER — Inpatient Hospital Stay (HOSPITAL_COMMUNITY)
Admission: EM | Admit: 2021-07-31 | Discharge: 2021-08-02 | DRG: 176 | Disposition: A | Discharge status: Home / Self Care | Payer: BC Managed Care – PPO | Attending: Internal Medicine | Admitting: Internal Medicine

## 2021-07-31 ENCOUNTER — Emergency Department (HOSPITAL_COMMUNITY): Payer: BC Managed Care – PPO

## 2021-07-31 ENCOUNTER — Telehealth: Payer: Self-pay | Admitting: Hematology

## 2021-07-31 DIAGNOSIS — I959 Hypotension, unspecified: Secondary | ICD-10-CM | POA: Diagnosis present

## 2021-07-31 DIAGNOSIS — C8290 Follicular lymphoma, unspecified, unspecified site: Secondary | ICD-10-CM

## 2021-07-31 DIAGNOSIS — R55 Syncope and collapse: Secondary | ICD-10-CM | POA: Diagnosis present

## 2021-07-31 DIAGNOSIS — E785 Hyperlipidemia, unspecified: Secondary | ICD-10-CM

## 2021-07-31 DIAGNOSIS — M109 Gout, unspecified: Secondary | ICD-10-CM | POA: Diagnosis present

## 2021-07-31 DIAGNOSIS — E782 Mixed hyperlipidemia: Secondary | ICD-10-CM | POA: Diagnosis not present

## 2021-07-31 DIAGNOSIS — M7989 Other specified soft tissue disorders: Secondary | ICD-10-CM

## 2021-07-31 DIAGNOSIS — Z8616 Personal history of COVID-19: Secondary | ICD-10-CM

## 2021-07-31 DIAGNOSIS — I82432 Acute embolism and thrombosis of left popliteal vein: Secondary | ICD-10-CM | POA: Diagnosis present

## 2021-07-31 DIAGNOSIS — I2699 Other pulmonary embolism without acute cor pulmonale: Secondary | ICD-10-CM | POA: Diagnosis not present

## 2021-07-31 DIAGNOSIS — C8201 Follicular lymphoma grade I, lymph nodes of head, face, and neck: Secondary | ICD-10-CM

## 2021-07-31 DIAGNOSIS — I82412 Acute embolism and thrombosis of left femoral vein: Secondary | ICD-10-CM | POA: Diagnosis present

## 2021-07-31 DIAGNOSIS — G4733 Obstructive sleep apnea (adult) (pediatric): Secondary | ICD-10-CM

## 2021-07-31 DIAGNOSIS — Z87891 Personal history of nicotine dependence: Secondary | ICD-10-CM

## 2021-07-31 DIAGNOSIS — I10 Essential (primary) hypertension: Secondary | ICD-10-CM

## 2021-07-31 DIAGNOSIS — Z79899 Other long term (current) drug therapy: Secondary | ICD-10-CM

## 2021-07-31 DIAGNOSIS — I82442 Acute embolism and thrombosis of left tibial vein: Secondary | ICD-10-CM | POA: Diagnosis present

## 2021-07-31 DIAGNOSIS — F32A Depression, unspecified: Secondary | ICD-10-CM | POA: Diagnosis present

## 2021-07-31 DIAGNOSIS — C8291 Follicular lymphoma, unspecified, lymph nodes of head, face, and neck: Secondary | ICD-10-CM | POA: Diagnosis present

## 2021-07-31 DIAGNOSIS — Z20822 Contact with and (suspected) exposure to covid-19: Secondary | ICD-10-CM | POA: Diagnosis present

## 2021-07-31 DIAGNOSIS — I2693 Single subsegmental pulmonary embolism without acute cor pulmonale: Principal | ICD-10-CM | POA: Diagnosis present

## 2021-07-31 DIAGNOSIS — Z6841 Body Mass Index (BMI) 40.0 and over, adult: Secondary | ICD-10-CM

## 2021-07-31 LAB — CBC WITH DIFFERENTIAL/PLATELET
Abs Immature Granulocytes: 0.03 10*3/uL (ref 0.00–0.07)
Basophils Absolute: 0.1 10*3/uL (ref 0.0–0.1)
Basophils Relative: 1 %
Eosinophils Absolute: 0.3 10*3/uL (ref 0.0–0.5)
Eosinophils Relative: 3 %
HCT: 43.7 % (ref 39.0–52.0)
Hemoglobin: 14.4 g/dL (ref 13.0–17.0)
Immature Granulocytes: 0 %
Lymphocytes Relative: 16 %
Lymphs Abs: 1.3 10*3/uL (ref 0.7–4.0)
MCH: 31 pg (ref 26.0–34.0)
MCHC: 33 g/dL (ref 30.0–36.0)
MCV: 94 fL (ref 80.0–100.0)
Monocytes Absolute: 0.7 10*3/uL (ref 0.1–1.0)
Monocytes Relative: 8 %
Neutro Abs: 5.7 10*3/uL (ref 1.7–7.7)
Neutrophils Relative %: 72 %
Platelets: 270 10*3/uL (ref 150–400)
RBC: 4.65 MIL/uL (ref 4.22–5.81)
RDW: 14.6 % (ref 11.5–15.5)
WBC: 8.1 10*3/uL (ref 4.0–10.5)
nRBC: 0 % (ref 0.0–0.2)

## 2021-07-31 LAB — COMPREHENSIVE METABOLIC PANEL
ALT: 37 U/L (ref 0–44)
AST: 29 U/L (ref 15–41)
Albumin: 4 g/dL (ref 3.5–5.0)
Alkaline Phosphatase: 68 U/L (ref 38–126)
Anion gap: 10 (ref 5–15)
BUN: 14 mg/dL (ref 8–23)
CO2: 29 mmol/L (ref 22–32)
Calcium: 8.9 mg/dL (ref 8.9–10.3)
Chloride: 98 mmol/L (ref 98–111)
Creatinine, Ser: 1.41 mg/dL — ABNORMAL HIGH (ref 0.61–1.24)
GFR, Estimated: 56 mL/min — ABNORMAL LOW (ref >60)
Glucose, Bld: 109 mg/dL — ABNORMAL HIGH (ref 70–99)
Potassium: 3.9 mmol/L (ref 3.5–5.1)
Sodium: 137 mmol/L (ref 135–145)
Total Bilirubin: 1 mg/dL (ref 0.3–1.2)
Total Protein: 7.8 g/dL (ref 6.5–8.1)

## 2021-07-31 LAB — RESP PANEL BY RT-PCR (FLU A&B, COVID) ARPGX2
Influenza A by PCR: NEGATIVE
Influenza B by PCR: NEGATIVE
SARS Coronavirus 2 by RT PCR: NEGATIVE

## 2021-07-31 LAB — D-DIMER, QUANTITATIVE: D-Dimer, Quant: 3.59 ug/mL-FEU — ABNORMAL HIGH (ref 0.00–0.50)

## 2021-07-31 LAB — TROPONIN I (HIGH SENSITIVITY)
Troponin I (High Sensitivity): 4 ng/L (ref <18)
Troponin I (High Sensitivity): 3 ng/L (ref <18)

## 2021-07-31 MED ORDER — PREDNISONE 20 MG PO TABS: ORAL_TABLET | ORAL | 0 refills | Status: DC

## 2021-07-31 MED ORDER — IOHEXOL 350 MG/ML SOLN
100.0000 mL | Freq: Once | INTRAVENOUS | Status: AC | PRN
  Administered 2021-07-31: 100 mL via INTRAVENOUS

## 2021-07-31 MED ORDER — HEPARIN BOLUS VIA INFUSION
7500.0000 [IU] | Freq: Once | INTRAVENOUS | Status: AC
  Administered 2021-07-31: 7500 [IU] via INTRAVENOUS

## 2021-07-31 MED ORDER — HEPARIN (PORCINE) 25000 UT/250ML-% IV SOLN
1900.0000 [IU]/h | INTRAVENOUS | Status: DC
  Administered 2021-07-31 – 2021-08-01 (×3): 1900 [IU]/h via INTRAVENOUS
  Filled 2021-07-31 (×3): qty 250

## 2021-07-31 MED ORDER — SODIUM CHLORIDE 0.9 % IV BOLUS
1000.0000 mL | Freq: Once | INTRAVENOUS | Status: AC
  Administered 2021-07-31: 1000 mL via INTRAVENOUS

## 2021-07-31 NOTE — H&P (Signed)
History and Physical  Joe [name] ZCH:[id number] DOB: [birth year] DOA: 07/31/2021  Referring physician: Milton Ferguson, MD PCP: Joe Squibb, MD  Patient coming from: Home  Chief Complaint: Low blood pressure  HPI: Joe [name] is a 65 y.o. 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  Most of the history was obtained from niece at bedside, per report, patient has had about 4 episodes of syncope within last week.  First episode lasted several hours, he was taken to a hospital in [place], he was evaluated for syncope and noted to have elevated CO2 level per ED medical record (10/8).  Patient has since had 2 other episodes prior to today which only lasted few seconds to a minute with return to baseline within few minutes and without any postictal state.  Patient had an appointment with his PCP today, and while still in the car, he had another episode of syncope outside the doctor's office which lasted about a minute.EMS was activated, and on arrival of EMS, BP was checked and was hypotensive at 85 and this was associated with diaphoresis.  CBG today was checked en route and was 112.  Patient denies fever, nausea, vomiting, chest pain or shortness of breath.  ED Course:  In the emergency department, he was hemodynamically stable.  Work-up in the ED showed normal CBC and BMP except for elevated creatinine at 1.41 (baseline creatinine at 1.1-1.3), troponin x2 was negative, hepatitis B surface antigen and core antibody were nonreactive, LDH 337, uric acid 6.3, D-dimer 3.59.  Influenza A, B, SARS coronavirus 2 was negative. CT angiography of chest with contrast showed segmental and subsegmental right lower and middle lobe  pulmonary emboli. Question left lower lobe subsegmental pulmonary emboli. No findings of right heart strain or pulmonary infarction. Chest x-ray showed no active disease Patient was started on a heparin drip.  Hospitalist was asked to admit patient for further evaluation and management.   Review of Systems: Constitutional: Negative for chills and fever.  HENT: Negative for ear pain and sore throat.   Eyes: Negative for pain and visual disturbance.  Respiratory: Negative for cough, chest tightness and shortness of breath.   Cardiovascular: Positive for syncope.  Negative for chest pain and palpitations.  Gastrointestinal: Negative for abdominal pain and vomiting.  Endocrine: Negative for polyphagia and polyuria.  Genitourinary: Negative for decreased urine volume, dysuria, enuresis Musculoskeletal: Negative for arthralgias and back pain.  Skin: Negative for color change and rash.  Allergic/Immunologic: Negative for immunocompromised state.  Neurological: Positive for weakness.  Negative for tremors, syncope, speech difficulty Hematological: Does not bruise/bleed easily.  All other systems reviewed and are negative  Past Medical History:  Diagnosis Date   Anxiety    Arthritis    Cancer ([place])    COVID    x 2 (2020 and 2022)   Depression    Hypertension    Past Surgical History:  Procedure Laterality Date   carpel tunnel     MASS BIOPSY Left 07/14/2021   Procedure: NECK MASS BIOPSY;  Surgeon: Melida Quitter, MD;  Location: [facility] OR;  Service: ENT;  Laterality: Left;    Social History:  reports that he has quit smoking. His smoking use included cigarettes. He has quit using smokeless tobacco.  His smokeless tobacco use included chew. He reports current alcohol use. He reports that he does not use drugs.   No Known Allergies  Family History  Problem Relation Age of Onset   Healthy Mother    Healthy Father      Prior to Admission medications   Medication Sig Start Date End Date  Taking? Authorizing Provider  allopurinol (ZYLOPRIM) 300 MG tablet Take 300 mg by mouth daily.   Yes [provider]  amLODipine (NORVASC) 10 MG tablet Take 10 mg by mouth daily. 04/26/20  Yes [provider]  sertraline (ZOLOFT) 50 MG tablet Take 50 mg by mouth daily.   Yes [provider]  buPROPion (WELLBUTRIN XL) 150 MG 24 hr tablet Take 150 mg by mouth 2 (two) times daily. Patient not taking: No sig reported    [provider]  naproxen (NAPROSYN) 500 MG tablet Take 1 tablet by mouth 2 (two) times daily with a meal. Patient not taking: No sig reported 05/28/21   [provider]  predniSONE (DELTASONE) 20 MG tablet Take 3 tablets (60 mg total) by mouth daily with breakfast for 5 days, THEN 2 tablets (40 mg total) daily with breakfast for 7 days, THEN 1 tablet (20 mg total) daily with breakfast for 10 days. Patient not taking: Reported on 07/31/2021 07/31/21 08/22/21  Brunetta Genera, MD  simvastatin (ZOCOR) 40 MG tablet Take 40 mg by mouth 3 (three) times a week. Patient not taking: No sig reported 06/26/21   [provider]    Physical Exam: BP (!) 161/96 (BP Location: Left Arm)   Pulse 94   Temp 98.4 F (36.9 C) (Oral)   Resp 17   Ht 5\' 9"  (1.753 m)   Wt (!) 146.7 kg   SpO2 98%   BMI 47.76 kg/m   General: 65 y.o. year-old male well developed well nourished in no acute distress.  Alert and oriented x3. HEENT: NCAT, EOMI Neck: Inflamed left side of neck.  trachea medial Cardiovascular: Regular rate and rhythm with no rubs or gallops.  No thyromegaly or JVD noted.  No lower extremity edema. 2/4 pulses in all 4 extremities. Respiratory: Clear to auscultation with no wheezes or rales. Good inspiratory effort. Abdomen: Soft, nontender nondistended with normal bowel sounds x4 quadrants. Muskuloskeletal: No cyanosis, clubbing or edema noted bilaterally Neuro: CN II-XII intact, strength 5/5 x 4, sensation, reflexes intact Skin: No  ulcerative lesions noted or rashes Psychiatry: Judgement and insight appear normal. Mood is appropriate for condition and setting          Labs on Admission:  Basic Metabolic Panel: Recent Labs  Lab 07/28/21 1407 07/30/21 1233 07/31/21 1524  NA 134* 138 137  K 3.8 4.0 3.9  CL 100 102 98  CO2 26 24 29   GLUCOSE 120* 96 109*  BUN 11 13 14   CREATININE 1.10 1.27* 1.41*  CALCIUM 9.2 9.7 8.9   Liver Function Tests: Recent Labs  Lab 07/28/21 1407 07/30/21 1233 07/31/21 1524  AST 27 26 29   ALT 29 33 37  ALKPHOS 61 72 68  BILITOT 0.8 0.6 1.0  PROT 7.5 8.2* 7.8  ALBUMIN 3.6 3.9 4.0   No results for input(s): LIPASE, AMYLASE in the last 168 hours. No results for input(s): AMMONIA in the last 168 hours. CBC: Recent Labs  Lab 07/28/21 1407 07/30/21 1233 07/31/21 1524  WBC 7.9 8.7 8.1  NEUTROABS 5.2 5.7 5.7  HGB 14.3 14.3 14.4  HCT 44.0 42.8 43.7  MCV  92.2 89.4 94.0  PLT 278 284 270   Cardiac Enzymes: No results for input(s): CKTOTAL, CKMB, CKMBINDEX, TROPONINI in the last 168 hours.  BNP (last 3 results) No results for input(s): BNP in the last 8760 hours.  ProBNP (last 3 results) No results for input(s): PROBNP in the last 8760 hours.  CBG: No results for input(s): GLUCAP in the last 168 hours.  Radiological Exams on Admission: CT Angio Chest PE W and/or Wo Contrast  Addendum Date: 07/31/2021   ADDENDUM REPORT: 07/31/2021 19:50 ADDENDUM: These results were called by telephone at the time of interpretation on 07/31/2021 at 7:48 pm to provider JOSEPH ZAMMIT , who verbally acknowledged these results. Electronically Signed   By: Iven Finn M.D.   On: 07/31/2021 19:50   Result Date: 07/31/2021 CLINICAL DATA:  PE suspected, low/intermediate prob, neg D-dimer EXAM: CT ANGIOGRAPHY CHEST WITH CONTRAST TECHNIQUE: Multidetector CT imaging of the chest was performed using the standard protocol during bolus administration of intravenous contrast. Multiplanar CT image  reconstructions and MIPs were obtained to evaluate the vascular anatomy. CONTRAST:  168mL OMNIPAQUE IOHEXOL 350 MG/ML SOLN COMPARISON:  None. FINDINGS: Cardiovascular: Satisfactory opacification of the pulmonary arteries to the segmental level. Segmental and subsegmental right lower lobe pulmonary embolus. Segmental and subsegmental right middle lobe pulmonary embolus. Limited evaluation of left lower lobe with question subsegmental pulmonary emboli. Normal heart size. No pericardial effusion. Mediastinum/Nodes: No enlarged mediastinal, hilar, or axillary lymph nodes. Thyroid gland, trachea, and esophagus demonstrate no significant findings. Lungs/Pleura: Limited evaluation due to motion artifact. Expiratory phase of respiration. Mosaic attenuation of the lungs. No focal consolidation. No pulmonary nodule. No pulmonary mass. No pleural effusion. No pneumothorax. Upper Abdomen: Possible mild hepatic steatosis. Musculoskeletal: No chest wall abnormality. No suspicious lytic or blastic osseous lesions. No acute displaced fracture. Multilevel degenerative changes of the spine. Review of the MIP images confirms the above findings. IMPRESSION: Segmental and subsegmental right lower and middle lobe pulmonary emboli. Question left lower lobe subsegmental pulmonary emboli. No findings of right heart strain or pulmonary infarction. Electronically Signed: By: Iven Finn M.D. On: 07/31/2021 19:48   DG Chest Port 1 View  Result Date: 07/31/2021 CLINICAL DATA:  Wheezing, history of COVID positive. EXAM: PORTABLE CHEST 1 VIEW COMPARISON:  July 28, 2021 FINDINGS: The heart size and mediastinal contours are within normal limits. Both lungs are clear. The visualized skeletal structures are unremarkable. IMPRESSION: No active disease. Electronically Signed   By: Virgina Norfolk M.D.   On: 07/31/2021 16:22    EKG: I independently viewed the EKG done and my findings are as followed: Normal sinus rhythm at a rate of 69  bpm  Assessment/Plan Present on Admission:  Pulmonary embolism ([place])  Principal Problem:   Pulmonary embolism ([place]) Active Problems:   Follicular lymphoma ([place])   Essential hypertension   Hyperlipidemia   OSA (obstructive sleep apnea)   Depression  Pulmonary embolism Chest x-ray showed segmental and subsegmental right lower and middle lobe emboli and also suspected subsegmental left lower lobe emboli Patient was started on IV heparin drip with plan to transition to [place] in the morning Bilateral lower extremity ultrasound will be done in the morning Echocardiogram already ordered and done on 10/10 by patient's oncologist, we shall await the official read of the echo  Recurrent syncope possibly secondary to above Continue telemetry and watch for arrhythmias Troponins x2 was negative EKG personally reviewed showed normal sinus rhythm at a rate of 69 bpm Echocardiogram was done on 10/10, but  pending official read of the echo  Follicular lymphoma Patient follows with Dr. Irene Limbo, Theodis Shove He was last seen on 10/10 by his oncologist Continue prednisone per home regimen  Essential hypertension Continue amlodipine  Mixed hyperlipidemia Patient was currently not taking home Zocor  OSA not on CPAP Stable  Depression Continue Zoloft   DVT prophylaxis: Heparin drip  Code Status: Full code  Family Communication: Niece at bedside (all questions answered to satisfaction)  Disposition Plan:  Patient is from:                        home Anticipated DC to:                   SNF or family members home Anticipated DC date:               2-3 days Anticipated DC barriers:         Patient requires inpatient management due to new onset PE and syncope requiring further work-up  Consults called: None  Admission status: Observation    Bernadette Hoit MD [facility]  08/01/2021, 1:27 AM

## 2021-07-31 NOTE — ED Triage Notes (Signed)
Pt from home via [facility]. Pt was in a car outside his Dr. Gabriel Carina and his sister called EMS due to weakness. Pt was hypotensive at 85 systolic and diaphoretic. Pts CBG en route was 112.

## 2021-07-31 NOTE — Progress Notes (Signed)
[facility] for heparin Indication: pulmonary embolus  Heparin Dosing Weight: 106.7kg  Labs: Recent Labs    07/30/21 1233 07/31/21 1524  HGB 14.3 14.4  HCT 42.8 43.7  PLT 284 270  CREATININE 1.27* 1.41*    Assessment: 8 yom recently diagnosed with lymphoma, s/p neck mass biopsy on 07/14/21 presenting with acute PE with no RHS. Pharmacy consulted to dose heparin. Patient is not on anticoagulation PTA. CBC wnl. SCr trend up to 1.41 (recently 1.1-1.3). No active bleed issues reported.  Goal of Therapy:  Heparin level 0.3-0.7 units/ml Monitor platelets by anticoagulation protocol: Yes   Plan:  Heparin 7500 unit bolus Start heparin at 1900 units/hr Check 6hr heparin level Monitor daily CBC, s/sx bleeding   Arturo Morton, PharmD, BCPS Clinical Pharmacist 07/31/2021 8:53 PM

## 2021-07-31 NOTE — Telephone Encounter (Signed)
Scheduled follow-up appointments per 10/10 los. Patient's niece is aware.

## 2021-07-31 NOTE — ED Provider Notes (Signed)
[facility] EMERGENCY DEPARTMENT Provider Note   CSN: [id number] Arrival date & time: 07/31/21  1445     History Chief Complaint  Patient presents with   Hypotension    Joe [name] is a 65 y.o. male.  Patient has a history of lymphoma that was recently diagnosed.  He has had 4 syncopal episodes in the last week.  He had another syncopal episode today.  He becomes diaphoretic and weak  The history is provided by the patient and medical records. No language interpreter was used.  Weakness Severity:  Moderate Onset quality:  Sudden Timing:  Constant Progression:  Worsening Chronicity:  New Context: not alcohol use   Relieved by:  Nothing Worsened by:  Nothing Ineffective treatments:  None tried Associated symptoms: no abdominal pain, no chest pain, no cough, no diarrhea, no frequency, no headaches and no seizures       Past Medical History:  Diagnosis Date   Anxiety    Arthritis    Cancer ([place])    COVID    x 2 (2020 and 2022)   Depression    Hypertension     Patient Active Problem List   Diagnosis Date Noted   Pulmonary embolism ([place]) 07/31/2021    Past Surgical History:  Procedure Laterality Date   carpel tunnel     MASS BIOPSY Left 07/14/2021   Procedure: NECK MASS BIOPSY;  Surgeon: Melida Quitter, MD;  Location: [facility] OR;  Service: ENT;  Laterality: Left;       Family History  Problem Relation Age of Onset   Healthy Mother    Healthy Father     Social History   Tobacco Use   Smoking status: Former    Types: Cigarettes   Smokeless tobacco: Former    Types: Chew  Substance Use Topics   Alcohol use: Yes    Comment: rare   Drug use: No    Home Medications Prior to Admission medications   Medication Sig Start Date End Date Taking? Authorizing Provider  allopurinol (ZYLOPRIM) 300 MG tablet Take 300 mg by mouth daily.   Yes [provider]  amLODipine (NORVASC) 10 MG tablet Take 10 mg by mouth daily. 04/26/20  Yes [provider]  sertraline (ZOLOFT) 50 MG tablet Take 50 mg by mouth daily.   Yes [provider]  buPROPion (WELLBUTRIN XL) 150 MG 24 hr tablet Take 150 mg by mouth 2 (two) times daily. Patient not taking: No sig reported    [provider]  naproxen (NAPROSYN) 500 MG tablet Take 1 tablet by mouth 2 (two) times daily with a meal. Patient not taking: No sig reported 05/28/21   [provider]  predniSONE (DELTASONE) 20 MG tablet Take 3 tablets (60 mg total) by mouth daily with breakfast for 5 days, THEN 2 tablets (40 mg total) daily with breakfast for 7 days, THEN 1 tablet (20 mg total) daily with breakfast for 10 days. Patient not taking: Reported on 07/31/2021 07/31/21 08/22/21  Brunetta Genera, MD  simvastatin (ZOCOR) 40 MG tablet Take 40 mg by mouth 3 (three) times a week. Patient not taking: No sig reported 06/26/21   [provider]    Allergies    Patient has no known allergies.  Review of Systems   Review of Systems  Constitutional:  Negative for appetite change and fatigue.  HENT:  Negative for congestion, ear discharge and sinus pressure.   Eyes:  Negative for discharge.  Respiratory:  Negative for cough.  Cardiovascular:  Negative for chest pain.  Gastrointestinal:  Negative for abdominal pain and diarrhea.  Genitourinary:  Negative for frequency and hematuria.  Musculoskeletal:  Negative for back pain.  Skin:  Negative for rash.  Neurological:  Positive for syncope and weakness. Negative for seizures and headaches.  Psychiatric/Behavioral:  Negative for hallucinations.    Physical Exam Updated Vital Signs BP 137/78   Pulse 78   Resp 15   SpO2 98%   Physical Exam Vitals and nursing note reviewed.  Constitutional:      Appearance: He is well-developed.  HENT:     Head: Normocephalic.     Nose: Nose normal.  Eyes:     General: No scleral icterus.    Conjunctiva/sclera: Conjunctivae normal.  Neck:     Thyroid: No thyromegaly.   Cardiovascular:     Rate and Rhythm: Normal rate and regular rhythm.     Heart sounds: No murmur heard.   No friction rub. No gallop.  Pulmonary:     Breath sounds: No stridor. No wheezing or rales.  Chest:     Chest wall: No tenderness.  Abdominal:     General: There is no distension.     Tenderness: There is no abdominal tenderness. There is no rebound.  Musculoskeletal:        General: Normal range of motion.     Cervical back: Neck supple.  Lymphadenopathy:     Cervical: No cervical adenopathy.  Skin:    Findings: No erythema or rash.  Neurological:     Mental Status: He is alert and oriented to person, place, and time.     Motor: No abnormal muscle tone.     Coordination: Coordination normal.  Psychiatric:        Behavior: Behavior normal.    ED Results / Procedures / Treatments   Labs (all labs ordered are listed, but only abnormal results are displayed) Labs Reviewed  COMPREHENSIVE METABOLIC PANEL - Abnormal; Notable for the following components:      Result Value   Glucose, Bld 109 (*)    Creatinine, Ser 1.41 (*)    GFR, Estimated 56 (*)    All other components within normal limits  D-DIMER, QUANTITATIVE - Abnormal; Notable for the following components:   D-Dimer, Quant 3.59 (*)    All other components within normal limits  CBC WITH DIFFERENTIAL/PLATELET  TROPONIN I (HIGH SENSITIVITY)  TROPONIN I (HIGH SENSITIVITY)    EKG None  Radiology CT Angio Chest PE W and/or Wo Contrast  Addendum Date: 07/31/2021   ADDENDUM REPORT: 07/31/2021 19:50 ADDENDUM: These results were called by telephone at the time of interpretation on 07/31/2021 at 7:48 pm to provider Dava Rensch , who verbally acknowledged these results. Electronically Signed   By: Iven Finn M.D.   On: 07/31/2021 19:50   Result Date: 07/31/2021 CLINICAL DATA:  PE suspected, low/intermediate prob, neg D-dimer EXAM: CT ANGIOGRAPHY CHEST WITH CONTRAST TECHNIQUE: Multidetector CT imaging of the  chest was performed using the standard protocol during bolus administration of intravenous contrast. Multiplanar CT image reconstructions and MIPs were obtained to evaluate the vascular anatomy. CONTRAST:  165mL OMNIPAQUE IOHEXOL 350 MG/ML SOLN COMPARISON:  None. FINDINGS: Cardiovascular: Satisfactory opacification of the pulmonary arteries to the segmental level. Segmental and subsegmental right lower lobe pulmonary embolus. Segmental and subsegmental right middle lobe pulmonary embolus. Limited evaluation of left lower lobe with question subsegmental pulmonary emboli. Normal heart size. No pericardial effusion. Mediastinum/Nodes: No enlarged mediastinal, hilar, or axillary lymph nodes.  Thyroid gland, trachea, and esophagus demonstrate no significant findings. Lungs/Pleura: Limited evaluation due to motion artifact. Expiratory phase of respiration. Mosaic attenuation of the lungs. No focal consolidation. No pulmonary nodule. No pulmonary mass. No pleural effusion. No pneumothorax. Upper Abdomen: Possible mild hepatic steatosis. Musculoskeletal: No chest wall abnormality. No suspicious lytic or blastic osseous lesions. No acute displaced fracture. Multilevel degenerative changes of the spine. Review of the MIP images confirms the above findings. IMPRESSION: Segmental and subsegmental right lower and middle lobe pulmonary emboli. Question left lower lobe subsegmental pulmonary emboli. No findings of right heart strain or pulmonary infarction. Electronically Signed: By: Iven Finn M.D. On: 07/31/2021 19:48   DG Chest Port 1 View  Result Date: 07/31/2021 CLINICAL DATA:  Wheezing, history of COVID positive. EXAM: PORTABLE CHEST 1 VIEW COMPARISON:  July 28, 2021 FINDINGS: The heart size and mediastinal contours are within normal limits. Both lungs are clear. The visualized skeletal structures are unremarkable. IMPRESSION: No active disease. Electronically Signed   By: Virgina Norfolk M.D.   On: 07/31/2021  16:22    Procedures Procedures   Medications Ordered in ED Medications  sodium chloride 0.9 % bolus 1,000 mL (0 mLs Intravenous Stopped 07/31/21 1903)  iohexol (OMNIPAQUE) 350 MG/ML injection 100 mL (100 mLs Intravenous Contrast Given 07/31/21 1913)    ED Course  I have reviewed the triage vital signs and the nursing notes.  Pertinent labs & imaging results that were available during my care of the patient were reviewed by me and considered in my medical decision making (see chart for details). CRITICAL CARE Performed by: Milton Ferguson Total critical care time: 40 minutes Critical care time was exclusive of separately billable procedures and treating other patients. Critical care was necessary to treat or prevent imminent or life-threatening deterioration. Critical care was time spent personally by me on the following activities: development of treatment plan with patient and/or surrogate as well as nursing, discussions with consultants, evaluation of patient's response to treatment, examination of patient, obtaining history from patient or surrogate, ordering and performing treatments and interventions, ordering and review of laboratory studies, ordering and review of radiographic studies, pulse oximetry and re-evaluation of patient's condition.    MDM Rules/Calculators/A&P                           Patient with multiple pulmonary emboli.  Syncopal episode most likely related to that.  He will be admitted to medicine and placed on heparin Final Clinical Impression(s) / ED Diagnoses Final diagnoses:  None    Rx / DC Orders ED Discharge Orders     None        Milton Ferguson, MD 08/07/21 1656

## 2021-08-01 ENCOUNTER — Observation Stay (HOSPITAL_COMMUNITY): Payer: BC Managed Care – PPO

## 2021-08-01 ENCOUNTER — Other Ambulatory Visit: Payer: Self-pay

## 2021-08-01 DIAGNOSIS — I959 Hypotension, unspecified: Secondary | ICD-10-CM | POA: Diagnosis present

## 2021-08-01 DIAGNOSIS — R55 Syncope and collapse: Secondary | ICD-10-CM | POA: Diagnosis present

## 2021-08-01 DIAGNOSIS — Z79899 Other long term (current) drug therapy: Secondary | ICD-10-CM | POA: Diagnosis not present

## 2021-08-01 DIAGNOSIS — I82432 Acute embolism and thrombosis of left popliteal vein: Secondary | ICD-10-CM | POA: Diagnosis present

## 2021-08-01 DIAGNOSIS — I10 Essential (primary) hypertension: Secondary | ICD-10-CM

## 2021-08-01 DIAGNOSIS — I2609 Other pulmonary embolism with acute cor pulmonale: Secondary | ICD-10-CM | POA: Diagnosis not present

## 2021-08-01 DIAGNOSIS — Z6841 Body Mass Index (BMI) 40.0 and over, adult: Secondary | ICD-10-CM | POA: Diagnosis not present

## 2021-08-01 DIAGNOSIS — I2699 Other pulmonary embolism without acute cor pulmonale: Secondary | ICD-10-CM | POA: Diagnosis not present

## 2021-08-01 DIAGNOSIS — I82442 Acute embolism and thrombosis of left tibial vein: Secondary | ICD-10-CM | POA: Diagnosis present

## 2021-08-01 DIAGNOSIS — E782 Mixed hyperlipidemia: Secondary | ICD-10-CM | POA: Diagnosis not present

## 2021-08-01 DIAGNOSIS — C8290 Follicular lymphoma, unspecified, unspecified site: Secondary | ICD-10-CM

## 2021-08-01 DIAGNOSIS — I82412 Acute embolism and thrombosis of left femoral vein: Secondary | ICD-10-CM | POA: Diagnosis present

## 2021-08-01 DIAGNOSIS — F32A Depression, unspecified: Secondary | ICD-10-CM | POA: Diagnosis present

## 2021-08-01 DIAGNOSIS — I2693 Single subsegmental pulmonary embolism without acute cor pulmonale: Secondary | ICD-10-CM | POA: Diagnosis present

## 2021-08-01 DIAGNOSIS — C8221 Follicular lymphoma grade III, unspecified, lymph nodes of head, face, and neck: Secondary | ICD-10-CM

## 2021-08-01 DIAGNOSIS — G4733 Obstructive sleep apnea (adult) (pediatric): Secondary | ICD-10-CM | POA: Diagnosis present

## 2021-08-01 DIAGNOSIS — Z87891 Personal history of nicotine dependence: Secondary | ICD-10-CM | POA: Diagnosis not present

## 2021-08-01 DIAGNOSIS — E785 Hyperlipidemia, unspecified: Secondary | ICD-10-CM

## 2021-08-01 DIAGNOSIS — M109 Gout, unspecified: Secondary | ICD-10-CM | POA: Diagnosis present

## 2021-08-01 DIAGNOSIS — C8291 Follicular lymphoma, unspecified, lymph nodes of head, face, and neck: Secondary | ICD-10-CM | POA: Diagnosis present

## 2021-08-01 DIAGNOSIS — Z8616 Personal history of COVID-19: Secondary | ICD-10-CM | POA: Diagnosis not present

## 2021-08-01 DIAGNOSIS — Z20822 Contact with and (suspected) exposure to covid-19: Secondary | ICD-10-CM | POA: Diagnosis present

## 2021-08-01 LAB — PROTIME-INR
INR: 1.1 (ref 0.8–1.2)
Prothrombin Time: 14.6 seconds (ref 11.4–15.2)

## 2021-08-01 LAB — ECHOCARDIOGRAM COMPLETE
AR max vel: 2.62 cm2
AV Area VTI: 2.63 cm2
AV Area mean vel: 2.82 cm2
AV Mean grad: 8 mmHg
AV Peak grad: 16.5 mmHg
Ao pk vel: 2.03 m/s
Area-P 1/2: 2.83 cm2
Height: 69 in
S' Lateral: 2.7 cm
Weight: 5174.64 oz

## 2021-08-01 LAB — COMPREHENSIVE METABOLIC PANEL
ALT: 36 U/L (ref 0–44)
AST: 27 U/L (ref 15–41)
Albumin: 3.5 g/dL (ref 3.5–5.0)
Alkaline Phosphatase: 61 U/L (ref 38–126)
Anion gap: 7 (ref 5–15)
BUN: 14 mg/dL (ref 8–23)
CO2: 29 mmol/L (ref 22–32)
Calcium: 8.3 mg/dL — ABNORMAL LOW (ref 8.9–10.3)
Chloride: 101 mmol/L (ref 98–111)
Creatinine, Ser: 1.06 mg/dL (ref 0.61–1.24)
GFR, Estimated: >60 mL/min (ref >60)
Glucose, Bld: 100 mg/dL — ABNORMAL HIGH (ref 70–99)
Potassium: 4 mmol/L (ref 3.5–5.1)
Sodium: 137 mmol/L (ref 135–145)
Total Bilirubin: 1 mg/dL (ref 0.3–1.2)
Total Protein: 7 g/dL (ref 6.5–8.1)

## 2021-08-01 LAB — HEPARIN LEVEL (UNFRACTIONATED)
Heparin Unfractionated: 0.71 IU/mL — ABNORMAL HIGH (ref 0.30–0.70)
Heparin Unfractionated: 0.66 IU/mL (ref 0.30–0.70)

## 2021-08-01 LAB — CBC
HCT: 40.5 % (ref 39.0–52.0)
Hemoglobin: 13.1 g/dL (ref 13.0–17.0)
MCH: 30.3 pg (ref 26.0–34.0)
MCHC: 32.3 g/dL (ref 30.0–36.0)
MCV: 93.8 fL (ref 80.0–100.0)
Platelets: 246 10*3/uL (ref 150–400)
RBC: 4.32 MIL/uL (ref 4.22–5.81)
RDW: 14.6 % (ref 11.5–15.5)
WBC: 8.3 10*3/uL (ref 4.0–10.5)
nRBC: 0 % (ref 0.0–0.2)

## 2021-08-01 LAB — PHOSPHORUS: Phosphorus: 3.2 mg/dL (ref 2.5–4.6)

## 2021-08-01 LAB — MAGNESIUM: Magnesium: 1.7 mg/dL (ref 1.7–2.4)

## 2021-08-01 LAB — HEPATITIS C ANTIBODY: HCV Ab: <0.1 s/co ratio — AB (ref 0.0–0.9)

## 2021-08-01 LAB — APTT: aPTT: 89 seconds — ABNORMAL HIGH (ref 24–36)

## 2021-08-01 MED ORDER — AMLODIPINE BESYLATE 5 MG PO TABS
10.0000 mg | ORAL_TABLET | Freq: Every day | ORAL | Status: DC
  Administered 2021-08-01 – 2021-08-02 (×2): 10 mg via ORAL
  Filled 2021-08-01 (×2): qty 2

## 2021-08-01 MED ORDER — SERTRALINE HCL 50 MG PO TABS
50.0000 mg | ORAL_TABLET | Freq: Every day | ORAL | Status: DC
  Administered 2021-08-01 – 2021-08-02 (×2): 50 mg via ORAL
  Filled 2021-08-01 (×2): qty 1

## 2021-08-01 MED ORDER — ENSURE ENLIVE PO LIQD
237.0000 mL | Freq: Two times a day (BID) | ORAL | Status: DC
  Administered 2021-08-01 – 2021-08-02 (×4): 237 mL via ORAL

## 2021-08-01 MED ORDER — PREDNISONE 20 MG PO TABS
60.0000 mg | ORAL_TABLET | Freq: Every day | ORAL | Status: DC
  Administered 2021-08-01 – 2021-08-02 (×2): 60 mg via ORAL
  Filled 2021-08-01 (×2): qty 3

## 2021-08-01 NOTE — Progress Notes (Signed)
PROGRESS NOTE  Joe [name] GLO:[id number] DOB: Jun 16, 1956 DOA: 07/31/2021 PCP: Celene Squibb, MD  HPI/Recap of past 24 hours: Joe [name] is a 64 y.o. 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 Most of the history was obtained from niece at bedside, per report, patient has had about 4 episodes of syncope within last week.  First episode lasted several hours, he was taken to a hospital in [place], he was evaluated for syncope and noted to have elevated CO2 level per ED medical record (10/8).  Patient has since had 3 other episodes, lasted few seconds to a minute with return to baseline within few minutes and without any postictal state. EMS was activated, BP was checked and was hypotensive at 85 and this was associated with diaphoresis. In the ED, noted to be HD stable. Work-up in the ED showed elevated creatinine at 1.41 (baseline creatinine at 1.1-1.3), D-dimer 3.59.  Influenza A, B, SARS coronavirus 2 was negative. CTA chest showed PE. No findings of right heart strain or pulmonary infarction. Patient was started on heparin drip.  Hospitalist was asked to admit patient for further evaluation and management.     Today, patient denies any new complaints, denies any chest pain, still shortness of breath, but denies any worsening, denies abdominal pain. Reports poor oral intake due to mass in neck and fear of choking.    Assessment/Plan: Principal Problem:   Pulmonary embolism (HCC) Active Problems:   Follicular lymphoma (HCC)   Essential hypertension   Hyperlipidemia   OSA (obstructive sleep apnea)   Depression   Acute Pulmonary embolism Currently on 2L of O2, saturating well CTA chest showed segmental and subsegmental right lower and middle lobe emboli and  also suspected subsegmental left lower lobe emboli ECHO pending Bilateral lower extremity ultrasound pending Continue heparin drip and plan to switch to PO Monitor closely on tele   Recurrent syncope possibly secondary to above Reports poor oral intake, only liquids and jell-o Orthostatic vitals pending Troponins x2 was negative, EKG with no acute ST changes ECHO as above Telemetry to r/o arrhythmia   Follicular lymphoma Patient follows with Dr. Irene Limbo, Theodis Shove He was last seen on 10/10 by his oncologist Continue prednisone per home regimen SLP for swallow eval  Essential hypertension Continue amlodipine  Mixed hyperlipidemia Patient was currently not taking home Zocor  OSA not on CPAP  Depression Continue Zoloft  Morbid obesity Lifestyle modification advised      Malnutrition Type:      Malnutrition Characteristics:      Nutrition Interventions:       Estimated body mass index is 47.76 kg/m as calculated from the following:   Height as of this encounter: 5\' 9"  (1.753 m).   Weight as of this encounter: 146.7 kg.     Code Status: Full  Family Communication: None at bedside  Disposition Plan: Status is: Inpatient  The patient will require care spanning > 2 midnights and should be moved to inpatient because: Inpatient level of care appropriate due to severity of illness  Dispo: The patient is from: Home              Anticipated d/c is to: Home              Patient currently  is not medically stable to d/c.   Difficult to place patient No     Consultants: None  Procedures: None  Antimicrobials: None  DVT prophylaxis: IV heparin   Objective: Vitals:   07/31/21 2200 08/01/21 0017 08/01/21 0024 08/01/21 0623  BP: 131/72 (!) 161/96  131/72  Pulse: 74 94  77  Resp: 15 17  14   Temp:  98.4 F (36.9 C)  98 F (36.7 C)  TempSrc:  Oral  Oral  SpO2: 98% 98%  92%  Weight:   (!) 146.7 kg   Height:   5\' 9"  (1.753 m)      Intake/Output Summary (Last 24 hours) at 08/01/2021 1455 Last data filed at 08/01/2021 1211 Gross per 24 hour  Intake 263.25 ml  Output 1250 ml  Net -986.75 ml   Filed Weights   08/01/21 0024  Weight: (!) 146.7 kg    Exam: General: NAD, chronically ill appearing Cardiovascular: S1, S2 present Respiratory: Diminished BS b/l Abdomen: Soft, nontender, obese, umbilical hernia, bowel sounds present Musculoskeletal: No bilateral pedal edema noted Skin: Normal Psychiatry: Fair mood, intermittently teary     Data Reviewed: CBC: Recent Labs  Lab 07/28/21 1407 07/30/21 1233 07/31/21 1524 08/01/21 0530  WBC 7.9 8.7 8.1 8.3  NEUTROABS 5.2 5.7 5.7  --   HGB 14.3 14.3 14.4 13.1  HCT 44.0 42.8 43.7 40.5  MCV 92.2 89.4 94.0 93.8  PLT 278 284 270 993   Basic Metabolic Panel: Recent Labs  Lab 07/28/21 1407 07/30/21 1233 07/31/21 1524 08/01/21 0530  NA 134* 138 137 137  K 3.8 4.0 3.9 4.0  CL 100 102 98 101  CO2 26 24 29 29   GLUCOSE 120* 96 109* 100*  BUN 11 13 14 14   CREATININE 1.10 1.27* 1.41* 1.06  CALCIUM 9.2 9.7 8.9 8.3*  MG  --   --   --  1.7  PHOS  --   --   --  3.2   GFR: Estimated Creatinine Clearance: 102 mL/min (by C-G formula based on SCr of 1.06 mg/dL). Liver Function Tests: Recent Labs  Lab 07/28/21 1407 07/30/21 1233 07/31/21 1524 08/01/21 0530  AST 27 26 29 27   ALT 29 33 37 36  ALKPHOS 61 72 68 61  BILITOT 0.8 0.6 1.0 1.0  PROT 7.5 8.2* 7.8 7.0  ALBUMIN 3.6 3.9 4.0 3.5   No results for input(s): LIPASE, AMYLASE in the last 168 hours. No results for input(s): AMMONIA in the last 168 hours. Coagulation Profile: Recent Labs  Lab 08/01/21 0530  INR 1.1   Cardiac Enzymes: No results for input(s): CKTOTAL, CKMB, CKMBINDEX, TROPONINI in the last 168 hours. BNP (last 3 results) No results for input(s): PROBNP in the last 8760 hours. HbA1C: No results for input(s): HGBA1C in the last 72 hours. CBG: No results for input(s): GLUCAP in the  last 168 hours. Lipid Profile: No results for input(s): CHOL, HDL, LDLCALC, TRIG, CHOLHDL, LDLDIRECT in the last 72 hours. Thyroid Function Tests: No results for input(s): TSH, T4TOTAL, FREET4, T3FREE, THYROIDAB in the last 72 hours. Anemia Panel: No results for input(s): VITAMINB12, FOLATE, FERRITIN, TIBC, IRON, RETICCTPCT in the last 72 hours. Urine analysis:    Component Value Date/Time   COLORURINE YELLOW 09/16/2016 0520   APPEARANCEUR CLEAR 09/16/2016 0520   LABSPEC >1.030 (H) 09/16/2016 0520   PHURINE 5.0 09/16/2016 0520   GLUCOSEU NEGATIVE 09/16/2016 0520   HGBUR NEGATIVE 09/16/2016 0520   BILIRUBINUR NEGATIVE 09/16/2016 0520   KETONESUR TRACE (A) 09/16/2016  [place] (A) 09/16/2016 0520   NITRITE NEGATIVE 09/16/2016 0520   LEUKOCYTESUR NEGATIVE 09/16/2016 0520   Sepsis Labs: @LABRCNTIP (procalcitonin:4,lacticidven:4)  ) Recent Results (from the past 240 hour(s))  Resp Panel by RT-PCR (Flu A&B, Covid) Nasopharyngeal Swab     Status: None   Collection Time: 07/31/21  9:58 PM   Specimen: Nasopharyngeal Swab; Nasopharyngeal(NP) swabs in vial transport medium  Result Value Ref Range Status   SARS Coronavirus 2 by RT PCR NEGATIVE NEGATIVE Final    Comment: (NOTE) SARS-CoV-2 target nucleic acids are NOT DETECTED.  The SARS-CoV-2 RNA is generally detectable in upper respiratory specimens during the acute phase of infection. The lowest concentration of SARS-CoV-2 viral copies this assay can detect is 138 copies/mL. A negative result does not preclude SARS-Cov-2 infection and should not be used as the sole basis for treatment or other patient management decisions. A negative result may occur with  improper specimen collection/handling, submission of specimen other than nasopharyngeal swab, presence of viral mutation(s) within the areas targeted by this assay, and inadequate number of viral copies(<138 copies/mL). A negative result must be combined with clinical  observations, patient history, and epidemiological information. The expected result is Negative.  Fact Sheet for Patients:  EntrepreneurPulse.com.au  Fact Sheet for Healthcare Providers:  IncredibleEmployment.be  This test is no t yet approved or cleared by the [place] FDA and  has been authorized for detection and/or diagnosis of SARS-CoV-2 by FDA under an Emergency Use Authorization (EUA). This EUA will remain  in effect (meaning this test can be used) for the duration of the COVID-19 declaration under Section 564(b)(1) of the Act, 21 U.S.C.section 360bbb-3(b)(1), unless the authorization is terminated  or revoked sooner.       Influenza A by PCR NEGATIVE NEGATIVE Final   Influenza B by PCR NEGATIVE NEGATIVE Final    Comment: (NOTE) The Xpert Xpress SARS-CoV-2/FLU/RSV plus assay is intended as an aid in the diagnosis of influenza from Nasopharyngeal swab specimens and should not be used as a sole basis for treatment. Nasal washings and aspirates are unacceptable for Xpert Xpress SARS-CoV-2/FLU/RSV testing.  Fact Sheet for Patients: EntrepreneurPulse.com.au  Fact Sheet for Healthcare Providers: IncredibleEmployment.be  This test is not yet approved or cleared by the [place] FDA and has been authorized for detection and/or diagnosis of SARS-CoV-2 by FDA under an Emergency Use Authorization (EUA). This EUA will remain in effect (meaning this test can be used) for the duration of the COVID-19 declaration under Section 564(b)(1) of the Act, 21 U.S.C. section 360bbb-3(b)(1), unless the authorization is terminated or revoked.  Performed at [facility], [address], [place], [place] [address]       Studies: CT Angio Chest PE W and/or Wo Contrast  Addendum Date: 07/31/2021   ADDENDUM REPORT: 07/31/2021 19:50 ADDENDUM: These results were called by telephone at the time of interpretation on  07/31/2021 at 7:48 pm to provider JOSEPH ZAMMIT , who verbally acknowledged these results. Electronically Signed   By: Iven Finn M.D.   On: 07/31/2021 19:50   Result Date: 07/31/2021 CLINICAL DATA:  PE suspected, low/intermediate prob, neg D-dimer EXAM: CT ANGIOGRAPHY CHEST WITH CONTRAST TECHNIQUE: Multidetector CT imaging of the chest was performed using the standard protocol during bolus administration of intravenous contrast. Multiplanar CT image reconstructions and MIPs were obtained to evaluate the vascular anatomy. CONTRAST:  142mL OMNIPAQUE IOHEXOL 350 MG/ML SOLN COMPARISON:  None. FINDINGS: Cardiovascular: Satisfactory opacification of the pulmonary arteries to the segmental level. Segmental  and subsegmental right lower lobe pulmonary embolus. Segmental and subsegmental right middle lobe pulmonary embolus. Limited evaluation of left lower lobe with question subsegmental pulmonary emboli. Normal heart size. No pericardial effusion. Mediastinum/Nodes: No enlarged mediastinal, hilar, or axillary lymph nodes. Thyroid gland, trachea, and esophagus demonstrate no significant findings. Lungs/Pleura: Limited evaluation due to motion artifact. Expiratory phase of respiration. Mosaic attenuation of the lungs. No focal consolidation. No pulmonary nodule. No pulmonary mass. No pleural effusion. No pneumothorax. Upper Abdomen: Possible mild hepatic steatosis. Musculoskeletal: No chest wall abnormality. No suspicious lytic or blastic osseous lesions. No acute displaced fracture. Multilevel degenerative changes of the spine. Review of the MIP images confirms the above findings. IMPRESSION: Segmental and subsegmental right lower and middle lobe pulmonary emboli. Question left lower lobe subsegmental pulmonary emboli. No findings of right heart strain or pulmonary infarction. Electronically Signed: By: Iven Finn M.D. On: 07/31/2021 19:48   DG Chest Port 1 View  Result Date: 07/31/2021 CLINICAL DATA:   Wheezing, history of COVID positive. EXAM: PORTABLE CHEST 1 VIEW COMPARISON:  July 28, 2021 FINDINGS: The heart size and mediastinal contours are within normal limits. Both lungs are clear. The visualized skeletal structures are unremarkable. IMPRESSION: No active disease. Electronically Signed   By: Virgina Norfolk M.D.   On: 07/31/2021 16:22    Scheduled Meds:  amLODipine  10 mg Oral Daily   feeding supplement  237 mL Oral BID BM   predniSONE  60 mg Oral Q breakfast   sertraline  50 mg Oral Daily    Continuous Infusions:  heparin 1,900 Units/hr (08/01/21 0810)     LOS: 0 days     Alma Friendly, MD [facility]  If 7PM-7AM, please contact night-coverage [email or web address] 08/01/2021, 2:55 PM

## 2021-08-01 NOTE — Progress Notes (Signed)
*  PRELIMINARY RESULTS* Echocardiogram 2D Echocardiogram has been performed. Patient did not want Definity at present time.  Samuel Germany 08/01/2021, 2:02 PM

## 2021-08-01 NOTE — Progress Notes (Signed)
[place] for heparin Indication: pulmonary embolus  Heparin Dosing Weight: 106.7kg  Labs: Recent Labs    07/30/21 1233 07/31/21 1524 08/01/21 0530  HGB 14.3 14.4 13.1  HCT 42.8 43.7 40.5  PLT 284 270 246  APTT  --   --  89*  LABPROT  --   --  14.6  INR  --   --  1.1  HEPARINUNFRC  --   --  0.71*  CREATININE 1.27* 1.41* 1.06     Assessment: 65 y.o. male with PE for heparin  Goal of Therapy:  Heparin level 0.3-0.7 units/ml Monitor platelets by anticoagulation protocol: Yes   Plan:  No change to heparin for now Recheck level in 6 hours, and adjust at that time if needed  Phillis Knack, PharmD, BCPS  08/01/2021 6:33 AM

## 2021-08-01 NOTE — Progress Notes (Signed)
[place] for heparin Indication: pulmonary embolus  Heparin Dosing Weight: 106.7kg  Labs: Recent Labs    07/30/21 1233 07/31/21 1524 08/01/21 0530 08/01/21 1154  HGB 14.3 14.4 13.1  --   HCT 42.8 43.7 40.5  --   PLT 284 270 246  --   APTT  --   --  89*  --   LABPROT  --   --  14.6  --   INR  --   --  1.1  --   HEPARINUNFRC  --   --  0.71* 0.66  CREATININE 1.27* 1.41* 1.06  --      Assessment: 65 y.o. male recently diagnosed with lymphoma, s/p neck mass biopsy on 07/14/21 presenting with acute PE with no RHS. Pharmacy consulted to dose heparin. Patient is not on anticoagulation PTA. CBC wnl. SCr trend up to 1.41 (recently 1.1-1.3). No active bleed issues reported/ Patient  with PE on CTA, . No findings of right heart strain or pulmonary infarction.  HL 0.66, remains therapeutic. F/U ECHO  Goal of Therapy:  Heparin level 0.3-0.7 units/ml Monitor platelets by anticoagulation protocol: Yes   Plan:  Continue heparin at 1900 units/hr Check heparin level daily F/U transition to po anticoagulation Monitor daily CBC, s/sx bleeding  Isac Sarna, BS [place], [place] Clinical Pharmacist Pager [phone] 08/01/2021 12:48 PM

## 2021-08-02 DIAGNOSIS — C8221 Follicular lymphoma grade III, unspecified, lymph nodes of head, face, and neck: Secondary | ICD-10-CM | POA: Diagnosis not present

## 2021-08-02 DIAGNOSIS — E782 Mixed hyperlipidemia: Secondary | ICD-10-CM | POA: Diagnosis not present

## 2021-08-02 DIAGNOSIS — I2699 Other pulmonary embolism without acute cor pulmonale: Secondary | ICD-10-CM | POA: Diagnosis not present

## 2021-08-02 DIAGNOSIS — I10 Essential (primary) hypertension: Secondary | ICD-10-CM | POA: Diagnosis not present

## 2021-08-02 LAB — BASIC METABOLIC PANEL
Anion gap: 9 (ref 5–15)
BUN: 16 mg/dL (ref 8–23)
CO2: 27 mmol/L (ref 22–32)
Calcium: 8.8 mg/dL — ABNORMAL LOW (ref 8.9–10.3)
Chloride: 101 mmol/L (ref 98–111)
Creatinine, Ser: 1.11 mg/dL (ref 0.61–1.24)
GFR, Estimated: >60 mL/min (ref >60)
Glucose, Bld: 98 mg/dL (ref 70–99)
Potassium: 3.5 mmol/L (ref 3.5–5.1)
Sodium: 137 mmol/L (ref 135–145)

## 2021-08-02 LAB — CBC
HCT: 39.9 % (ref 39.0–52.0)
Hemoglobin: 13.4 g/dL (ref 13.0–17.0)
MCH: 31 pg (ref 26.0–34.0)
MCHC: 33.6 g/dL (ref 30.0–36.0)
MCV: 92.4 fL (ref 80.0–100.0)
Platelets: 249 10*3/uL (ref 150–400)
RBC: 4.32 MIL/uL (ref 4.22–5.81)
RDW: 14.3 % (ref 11.5–15.5)
WBC: 10 10*3/uL (ref 4.0–10.5)
nRBC: 0 % (ref 0.0–0.2)

## 2021-08-02 LAB — HEPARIN LEVEL (UNFRACTIONATED): Heparin Unfractionated: 0.58 IU/mL (ref 0.30–0.70)

## 2021-08-02 MED ORDER — APIXABAN (ELIQUIS) VTE STARTER PACK (10MG AND 5MG): ORAL_TABLET | ORAL | 0 refills | Status: DC

## 2021-08-02 MED ORDER — APIXABAN 5 MG PO TABS: 5.0000 mg | ORAL_TABLET | Freq: Two times a day (BID) | ORAL | Status: DC

## 2021-08-02 MED ORDER — ADULT MULTIVITAMIN W/MINERALS CH: 1.0000 | ORAL_TABLET | Freq: Every day | ORAL | 0 refills | Status: AC

## 2021-08-02 MED ORDER — SODIUM CHLORIDE 0.9 % IV SOLN: INTRAVENOUS | Status: DC

## 2021-08-02 MED ORDER — ADULT MULTIVITAMIN W/MINERALS CH
1.0000 | ORAL_TABLET | Freq: Every day | ORAL | Status: DC
  Administered 2021-08-02: 1 via ORAL
  Filled 2021-08-02: qty 1

## 2021-08-02 MED ORDER — APIXABAN 5 MG PO TABS
10.0000 mg | ORAL_TABLET | Freq: Two times a day (BID) | ORAL | Status: DC
Start: 2021-08-02 — End: 2021-08-03
  Administered 2021-08-02: 10 mg via ORAL
  Filled 2021-08-02: qty 2

## 2021-08-02 NOTE — Progress Notes (Addendum)
Initial Nutrition Assessment  DOCUMENTATION CODES:   Morbid obesity  INTERVENTION:   -Continue Ensure Enlive po BID, each supplement provides 350 kcal and 20 grams of protein  -MVI with minerals daily -Magic cup TID with meals, each supplement provides 290 kcal and 9 grams of protein  -Downgrade diet to dysphagia 3 (advanced mechanical soft) for ease of intake  NUTRITION DIAGNOSIS:   Increased nutrient needs related to cancer and cancer related treatments as evidenced by estimated needs.  GOAL:   Patient will meet greater than or equal to 90% of their needs  MONITOR:   PO intake, Supplement acceptance, Labs, Weight trends, Skin, I & O's  REASON FOR ASSESSMENT:   Malnutrition Screening Tool    ASSESSMENT:   Joe [name] is a 65 y.o. 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  Most of the history was obtained from niece at bedside, per report, patient has had about 4 episodes of syncope within last week.  First episode lasted several hours, he was taken to a hospital in [place], he was evaluated for syncope and noted to have elevated CO2 level per ED medical record (10/8).  Patient has since had 2 other episodes prior to today which only lasted few seconds to a minute with return to baseline within few minutes and without any postictal state.  Patient had an appointment with his PCP today, and while still in the car, he had another episode of syncope outside the doctor's office which lasted about a minute.EMS was activated, and on arrival of EMS, BP was checked and was hypotensive at 85 and this was associated with diaphoresis.  CBG today was checked en route and was 112.  Patient denies fever, nausea,  vomiting, chest pain or shortness of breath.  Pt admitted with pulmonary embolism and recurrent syncope.   Reviewed I/O's: -241 ml x 24 hours and -556 ml since admission  UOP: 750 ml x 24 hours  Spoke with pt and niece at bedside. Pt reports decreased oral intake over the past month, which he attributes to discomfort when eat due to neck mass. Per niece, this discomfort has improved with initiation of steroids. Pt shares that he does best with foods that are softer in texture and liquids. Niece shares that pt's sister now cooks for him 3 times per day and meals consist of a meat, starch, and vegetable. Pt's favorite items include mashed potatoes, puddings, and applesauce secondary to ease of intake. Pt tolerating meals well and consumed 100% of breakfast and dinner yesterday.   Per pt, his UBW is around 250-270#. He suspects he has lost about 20 pounds over the past month. Pt and niece are not concerned about weight loss and pt shares that he has been eating more healthfully since his family has helped him prepare meals. Prior to this, pt endorses eating a lot of fast food and drinking a lot of soft drinks, which he now avoids. Reviewed wt hx; pt as experienced a 4.7% wt loss over the past month.   Pt shares that he will be followed by [facility] and plans to start radiation treatments soon. RD discussed importance of good meal and supplement intake to promote healing. Pt also amenable to diet downgrade. RD  encouraged pt to focus on weight maintenance, instead of weight loss, during cancer treatments secondary to potential side effects. RD also communicated with [facility] RDs.   Medications reviewed and include prednisone and 0.9% sodium chloride infusion @ 100 ml/hr.   Labs reviewed.   NUTRITION - FOCUSED PHYSICAL EXAM:  Flowsheet Row Most Recent Value  Orbital Region No depletion  Upper Arm Region No depletion  Thoracic and Lumbar Region No depletion  Buccal  Region No depletion  Temple Region No depletion  Clavicle Bone Region No depletion  Clavicle and Acromion Bone Region No depletion  Scapular Bone Region No depletion  Dorsal Hand No depletion  Patellar Region No depletion  Anterior Thigh Region No depletion  Posterior Calf Region No depletion  Edema (RD Assessment) Mild  Hair Reviewed  Eyes Reviewed  Mouth Reviewed  Skin Reviewed  Nails Reviewed       Diet Order:   Diet Order             Diet Heart Room service appropriate? Yes; Fluid consistency: Thin  Diet effective now                   EDUCATION NEEDS:   Education needs have been addressed  Skin:  Skin Assessment: Skin Integrity Issues: Skin Integrity Issues:: Incisions Incisions: closed lt neck  Last BM:  08/01/21  Height:   Ht Readings from Last 1 Encounters:  08/01/21 5\' 9"  (1.753 m)    Weight:   Wt Readings from Last 1 Encounters:  08/01/21 (!) 146.7 kg    Ideal Body Weight:  72.7 kg  BMI:  Body mass index is 47.76 kg/m.  Estimated Nutritional Needs:   Kcal:  2100-2300  Protein:  120-140 grams  Fluid:  > 2 L    Loistine Chance, RD, LDN, [place] Registered Dietitian II Certified Diabetes Care and Education Specialist Please refer to [facility] for RD and/or RD on-call/weekend/after hours pager

## 2021-08-02 NOTE — Progress Notes (Signed)
[place] for apixaban from heparin  Indication: pulmonary embolus  Heparin Dosing Weight: 106.7kg  Labs: Recent Labs    07/31/21 1524 08/01/21 0530 08/01/21 1154 08/02/21 0552  HGB 14.4 13.1  --  13.4  HCT 43.7 40.5  --  39.9  PLT 270 246  --  249  APTT  --  89*  --   --   LABPROT  --  14.6  --   --   INR  --  1.1  --   --   HEPARINUNFRC  --  0.71* 0.66 0.58  CREATININE 1.41* 1.06  --  1.11     Assessment: 65 y.o. male recently diagnosed with lymphoma, s/p neck mass biopsy on 07/14/21 presenting with acute PE with no RHS. Pharmacy consulted to dose heparin. Patient is not on anticoagulation PTA. CBC wnl. SCr trend up to 1.41 (recently 1.1-1.3). No active bleed issues reported/ Patient  with PE on CTA, . No findings of right heart strain or pulmonary infarction.  HL 0.58, remains therapeutic. F/U ECHO  Goal of Therapy:  Heparin level 0.3-0.7 units/ml Monitor platelets by anticoagulation protocol: Yes   Plan:  Stop heparin Start apixaban 10mg  po bid x 7 days followed by apixaban 5mg  po BID thereafter Monitor daily CBC, s/sx bleeding  Thomasenia Sales, PharmD, MBA, BCGP Clinical Pharmacist  08/02/2021 8:08 AM

## 2021-08-02 NOTE — Progress Notes (Signed)
Pharmacist Chemotherapy Monitoring - Initial Assessment    Anticipated start date: 08/09/21   The following has been reviewed per standard work regarding the patient's treatment regimen: The patient's diagnosis, treatment plan and drug doses, and organ/hematologic function Lab orders and baseline tests specific to treatment regimen  The treatment plan start date, drug sequencing, and pre-medications Prior authorization status  Patient's documented medication list, including drug-drug interaction screen and prescriptions for anti-emetics and supportive care specific to the treatment regimen The drug concentrations, fluid compatibility, administration routes, and timing of the medications to be used The patient's access for treatment and lifetime cumulative dose history, if applicable  The patient's medication allergies and previous infusion related reactions, if applicable   Changes made to treatment plan:  Need orders - msg'd Dr. Irene Limbo  Follow up needed:  prescriptions needed for anti-emetics PA not initiated until orders for chemo entered.  Port placement sched for 08/14/21 - will Cycle 1 Adriamycin be given peripherally?  Dr. Irene Limbo made aware.    Kennith Center, Pharm.D., CPP 08/02/2021@3 :09 PM

## 2021-08-02 NOTE — Discharge Summary (Signed)
Discharge Summary  Joe [name] LEX:[id number] DOB: 14-Jun-1956  PCP: Celene Squibb, MD  Admit date: 07/31/2021 Discharge date: 08/02/2021  Time spent: 40 mins  Recommendations for Outpatient Follow-up:  Follow up with PCP in 1 week Follow up with Oncology as scheduled  Discharge Diagnoses:  Active Hospital Problems   Diagnosis Date Noted   Pulmonary embolism ([place]) 44/96/7591   Follicular lymphoma ([place]) 08/01/2021   Essential hypertension 08/01/2021   Hyperlipidemia 08/01/2021   OSA (obstructive sleep apnea) 08/01/2021   Depression 08/01/2021    Resolved Hospital Problems  No resolved problems to display.    Discharge Condition: Stable  Diet recommendation: Mechanical soft  Vitals:   08/02/21 0308 08/02/21 1442  BP: (!) 142/70 137/79  Pulse: 69 87  Resp: 20 18  Temp: 98 F (36.7 C) (!) 97.4 F (36.3 C)  SpO2: 91% 94%    History of present illness:   Joe [name] is a 64 y.o. 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 Most of the history was obtained from niece at bedside, per report, patient has had about 4 episodes of syncope within last week.  First episode lasted several hours, he was taken to a hospital in [place], he was evaluated for syncope and noted to have elevated CO2 level per ED medical record (10/8).  Patient has since had 3 other episodes, lasted few seconds to a minute with return to baseline within few minutes and without any postictal state. EMS was activated, BP was checked and was hypotensive at 85 and this was associated with diaphoresis. In the ED, noted to be HD stable. Work-up in the ED showed elevated creatinine at 1.41 (baseline creatinine at 1.1-1.3), D-dimer 3.59.  Influenza A, B, SARS coronavirus 2 was negative. CTA  chest showed PE. No findings of right heart strain or pulmonary infarction. Patient was started on heparin drip.  Hospitalist was asked to admit patient for further evaluation and management.    Today, patient denies any new complaints, reports feeling much better.  Currently saturating well on room air at rest and upon ambulation. Repeat orthostatic negative. Patient stable to be d/c home with follow up with PCP and oncology   Hospital Course:  Principal Problem:   Pulmonary embolism ([place]) Active Problems:   Follicular lymphoma ([place])   Essential hypertension   Hyperlipidemia   OSA (obstructive sleep apnea)   Depression   Acute Pulmonary embolism Saturating well on RA CTA chest showed segmental and subsegmental right lower and middle lobe emboli and also suspected subsegmental left lower lobe emboli ECHO with normal EF, no RWMA Bilateral lower extremity doppler showed LLE DVT S/P heparin drip--> switched to PO eliquis Follow up with PCP/oncology    Recurrent syncope possibly due to orthostatic hypotension Vs above Reports poor oral intake, only liquids and jell-o due to mass in neck Orthostatic vitals during inpatient was positive, repeat today negative s/p IVF  Troponins x2 was negative, EKG with no acute ST changes ECHO as above Advised to stay hydrated  Follicular lymphoma Patient follows with Dr. Irene Limbo, Theodis Shove He was last seen on 10/10 by his oncologist Continue prednisone per home regimen SLP for swallow eval- rec mechanical soft diet with aspiration precautions   Essential hypertension Continue amlodipine  Mixed hyperlipidemia Patient was  currently not taking home Zocor, continue  OSA not on CPAP  Depression Continue Zoloft   Morbid obesity Lifestyle modification advised    Malnutrition Type:  Nutrition Problem: Increased nutrient needs Etiology: cancer and cancer related treatments   Malnutrition Characteristics:  Signs/Symptoms: estimated  needs   Nutrition Interventions:  Interventions: Ensure Enlive (each supplement provides 350kcal and 20 grams of protein), MVI, Refer to RD note for recommendations   Estimated body mass index is 47.76 kg/m as calculated from the following:   Height as of this encounter: 5\' 9"  (1.753 m).   Weight as of this encounter: 146.7 kg.    Procedures: None  Consultations: None  Discharge Exam: BP 137/79 (BP Location: Left Arm)   Pulse 87   Temp (!) 97.4 F (36.3 C) (Oral)   Resp 18   Ht 5\' 9"  (1.753 m)   Wt (!) 146.7 kg   SpO2 94%   BMI 47.76 kg/m   General: NAD  Cardiovascular: S1, S2 present Respiratory: CTAB Abdomen: Soft, nontender, nondistended, bowel sounds present Musculoskeletal: No bilateral pedal edema noted Skin: Normal Psychiatry: Normal mood  Discharge Instructions You were cared for by a hospitalist during your hospital stay. If you have any questions about your discharge medications or the care you received while you were in the hospital after you are discharged, you can call the unit and asked to speak with the hospitalist on call if the hospitalist that took care of you is not available. Once you are discharged, your primary care physician will handle any further medical issues. Please note that NO REFILLS for any discharge medications will be authorized once you are discharged, as it is imperative that you return to your primary care physician (or establish a relationship with a primary care physician if you do not have one) for your aftercare needs so that they can reassess your need for medications and monitor your lab values.   Allergies as of 08/02/2021   No Known Allergies      Medication List     STOP taking these medications    buPROPion 150 MG 24 hr tablet Commonly known as: WELLBUTRIN XL   naproxen 500 MG tablet Commonly known as: NAPROSYN       TAKE these medications    allopurinol 300 MG tablet Commonly known as: ZYLOPRIM Take 300  mg by mouth daily.   amLODipine 10 MG tablet Commonly known as: NORVASC Take 10 mg by mouth daily.   Apixaban Starter Pack (10mg  and 5mg ) Commonly known as: ELIQUIS STARTER PACK Take as directed on package: start with two-5mg  tablets twice daily for 7 days. On day 8, switch to one-5mg  tablet twice daily.   multivitamin with minerals Tabs tablet Take 1 tablet by mouth daily. Start taking on: August 03, 2021   predniSONE 20 MG tablet Commonly known as: DELTASONE Take 3 tablets (60 mg total) by mouth daily with breakfast for 5 days, THEN 2 tablets (40 mg total) daily with breakfast for 7 days, THEN 1 tablet (20 mg total) daily with breakfast for 10 days. Start taking on: July 31, 2021   sertraline 50 MG tablet Commonly known as: ZOLOFT Take 50 mg by mouth daily.   simvastatin 40 MG tablet Commonly known as: ZOCOR Take 40 mg by mouth 3 (three) times a week.       No Known Allergies  Follow-up Information     Celene Squibb, MD. Schedule an appointment as soon as possible for a visit in 1  week(s).   Specialty: Internal Medicine Contact information: [place] [address] [phone]                  The results of significant diagnostics from this hospitalization (including imaging, microbiology, ancillary and laboratory) are listed below for reference.    Significant Diagnostic Studies: DG Chest 2 View  Result Date: 07/28/2021 CLINICAL DATA:  Seizure-like activity. EXAM: CHEST - 2 VIEW COMPARISON:  June 08, 2019 FINDINGS: The heart size and mediastinal contours are within normal limits. Both lungs are clear. The visualized skeletal structures are unremarkable. IMPRESSION: No active cardiopulmonary disease. Electronically Signed   By: Fidela Salisbury M.D.   On: 07/28/2021 14:51   CT Head Wo Contrast  Result Date: 07/28/2021 CLINICAL DATA:  Seizure activity, frontal scalp abrasion EXAM: CT HEAD WITHOUT CONTRAST TECHNIQUE: Contiguous  axial images were obtained from the base of the skull through the vertex without intravenous contrast. COMPARISON:  None. FINDINGS: Brain: No acute infarct or hemorrhage. Lateral ventricles and midline structures are unremarkable. No acute extra-axial fluid collections. No mass effect. Vascular: No hyperdense vessel or unexpected calcification. Skull: Normal. Negative for fracture or focal lesion. Sinuses/Orbits: Mild mucosal thickening within the maxillary sinuses. Remaining paranasal sinuses are clear. Other: None. IMPRESSION: 1. No acute intracranial process. Electronically Signed   By: Randa Ngo M.D.   On: 07/28/2021 18:39   CT Soft Tissue Neck W Contrast  Result Date: 07/09/2021 CLINICAL DATA:  Neck abscess, deep tissue EXAM: CT NECK WITH CONTRAST TECHNIQUE: Multidetector CT imaging of the neck was performed using the standard protocol following the bolus administration of intravenous contrast. CONTRAST:  75mL OMNIPAQUE IOHEXOL 350 MG/ML SOLN COMPARISON:  None. FINDINGS: Pharynx and larynx: The large left neck mass described below involves the left parapharyngeal space, exerting mild mass effect on the or pharyngeal airway. Tonsillar hypertrophy. Salivary glands: No inflammation, mass, or stone. The mass described below exerts mass effect on the left submandibular gland, which is displaced anteriorly Thyroid: Normal. Lymph nodes: Scattered prominent lymph nodes in the submandibular region bilaterally and upper cervical chain Vascular: The large left neck mass is inseparable from the left carotid and internal jugular vein, which is not well visualized. Limited evaluation of patency due to non arterial timing. Limited intracranial: No obvious acute intracranial abnormality in the visualized brain. Visualized orbits: Visualized orbits are unremarkable. Mastoids and visualized paranasal sinuses: Bilateral maxillary sinus mucosal thickening with air-fluid level on the left. Skeleton: Degenerative change.  Upper chest: Visualized lung apices are clear. Other: Large heterogeneous soft tissue mass within the left neck which measures up to proximally 7.9 x 6.1 by 8.2 cm. The mass is subjacent to and inseparable from the left sternocleidomastoid and extends from the skull base/parapharyngeal space inferiorly to the supraclavicular fossa. The mass obliterates the left internal jugular vein and exerts mild mass effect on the airway in the neck. In the mid neck, the mass abuts the cricoid cartilage on the left and the left strap musculature. In the lower neck, the mass abuts the left thyroid. IMPRESSION: 1. Large heterogeneous soft tissue mass in the left neck, extending from the skull base/parapharyngeal space inferiorly to the supraclavicular fossa, located immediately subjacent to and inseparable from the left sternocleidomastoid. The left internal jugular vein is obliterated by the mass and left carotid is inseparable from the mass, poorly visualized. Findings could represent malignancy or infectious phlegmon. The rapid development (patient reportedly only noticed the swelling for the past week) favors infection.  Soft tissue sampling could provide more definitive diagnosis. 2. Nonspecific mild surrounding stranding and scattered prominent lymph nodes throughout the neck. Findings discussed with Dr. Almyra Free via telephone at 6:22 p.m. Electronically Signed   By: Margaretha Sheffield M.D.   On: 07/09/2021 18:41   CT Angio Chest PE W and/or Wo Contrast  Addendum Date: 07/31/2021   ADDENDUM REPORT: 07/31/2021 19:50 ADDENDUM: These results were called by telephone at the time of interpretation on 07/31/2021 at 7:48 pm to provider JOSEPH ZAMMIT , who verbally acknowledged these results. Electronically Signed   By: Iven Finn M.D.   On: 07/31/2021 19:50   Result Date: 07/31/2021 CLINICAL DATA:  PE suspected, low/intermediate prob, neg D-dimer EXAM: CT ANGIOGRAPHY CHEST WITH CONTRAST TECHNIQUE: Multidetector CT imaging of  the chest was performed using the standard protocol during bolus administration of intravenous contrast. Multiplanar CT image reconstructions and MIPs were obtained to evaluate the vascular anatomy. CONTRAST:  177mL OMNIPAQUE IOHEXOL 350 MG/ML SOLN COMPARISON:  None. FINDINGS: Cardiovascular: Satisfactory opacification of the pulmonary arteries to the segmental level. Segmental and subsegmental right lower lobe pulmonary embolus. Segmental and subsegmental right middle lobe pulmonary embolus. Limited evaluation of left lower lobe with question subsegmental pulmonary emboli. Normal heart size. No pericardial effusion. Mediastinum/Nodes: No enlarged mediastinal, hilar, or axillary lymph nodes. Thyroid gland, trachea, and esophagus demonstrate no significant findings. Lungs/Pleura: Limited evaluation due to motion artifact. Expiratory phase of respiration. Mosaic attenuation of the lungs. No focal consolidation. No pulmonary nodule. No pulmonary mass. No pleural effusion. No pneumothorax. Upper Abdomen: Possible mild hepatic steatosis. Musculoskeletal: No chest wall abnormality. No suspicious lytic or blastic osseous lesions. No acute displaced fracture. Multilevel degenerative changes of the spine. Review of the MIP images confirms the above findings. IMPRESSION: Segmental and subsegmental right lower and middle lobe pulmonary emboli. Question left lower lobe subsegmental pulmonary emboli. No findings of right heart strain or pulmonary infarction. Electronically Signed: By: Iven Finn M.D. On: 07/31/2021 19:48   US Venous Img Lower Bilateral (DVT)  Result Date: 08/01/2021 CLINICAL DATA:  Pulmonary emboli on CTA chest 07/31/2021. Lower extremity edema. EXAM: Bilateral LOWER EXTREMITY VENOUS DOPPLER ULTRASOUND TECHNIQUE: Gray-scale sonography with compression, as well as color and duplex ultrasound, were performed to evaluate the deep venous system(s) from the level of the common femoral vein through the  popliteal and proximal calf veins. COMPARISON:  None. FINDINGS: VENOUS Left side: Substantial thrombus in the superficial femoral vein and profundus femoral vein on the left, with no significant compressibility, partially occlusive but not completely occlusive. This extends into the posterior tibial veins and peroneal veins as well as the popliteal vein. The common femoral vein appears patent. Right side: Normal appearance of the common femoral vein, superficial femoral vein, popliteal vein, and calf veins on the right. Expected compressibility and augmentation. OTHER None. Limitations: none IMPRESSION: 1. Positive for left-sided DVT with left superficial femoral vein, profundus femoral vein, popliteal vein, and calf vein thrombosis on the left side, in this patient with recent diagnosis of acute pulmonary embolus. No findings of DVT on the right side. Electronically Signed   By: Van Clines M.D.   On: 08/01/2021 15:50   DG Chest Port 1 View  Result Date: 07/31/2021 CLINICAL DATA:  Wheezing, history of COVID positive. EXAM: PORTABLE CHEST 1 VIEW COMPARISON:  July 28, 2021 FINDINGS: The heart size and mediastinal contours are within normal limits. Both lungs are clear. The visualized skeletal structures are unremarkable. IMPRESSION: No active disease. Electronically Signed   By:  Virgina Norfolk M.D.   On: 07/31/2021 16:22   ECHOCARDIOGRAM COMPLETE  Result Date: 08/01/2021    ECHOCARDIOGRAM REPORT   Patient Name:   Joe [name] Date of Exam: 08/01/2021 Medical Rec #:  [id number]       Height:       69.0 in Accession #:    [id number]      Weight:       323.4 lb Date of Birth:  06/18/1956       BSA:          2.534 m Patient Age:    5 years        BP:           149/82 mmHg Patient Gender: M               HR:           95 bpm. Exam Location:  [facility] Procedure: 2D Echo, Cardiac Doppler and Color Doppler Indications:    Pulmonary Embolus I26.09  History:        Patient has no prior history of  Echocardiogram examinations.                 Risk Factors:Hypertension and Dyslipidemia. Obesity. OSA                 (obstructive sleep apnea). Covid-19 infection. Cancer ([place])                 (From Hx).  Sonographer:    Alvino Chapel RCS Referring Phys: [id number] [place]  1. Left ventricular ejection fraction, by estimation, is 60 to 65%. The left ventricle has normal function. The left ventricle has no regional wall motion abnormalities. Left ventricular diastolic parameters were normal.  2. Right ventricular systolic function is normal. The right ventricular size is normal. Tricuspid regurgitation signal is inadequate for assessing PA pressure.  3. The mitral valve is grossly normal. No evidence of mitral valve regurgitation. No evidence of mitral stenosis.  4. The aortic valve is tricuspid. Aortic valve regurgitation is not visualized. No aortic stenosis is present.  5. The inferior vena cava is normal in size with greater than 50% respiratory variability, suggesting right atrial pressure of 3 mmHg. Conclusion(s)/Recommendation(s): Normal biventricular function without evidence of hemodynamically significant valvular heart disease. FINDINGS  Left Ventricle: Left ventricular ejection fraction, by estimation, is 60 to 65%. The left ventricle has normal function. The left ventricle has no regional wall motion abnormalities. The left ventricular internal cavity size was normal in size. There is  no left ventricular hypertrophy. Left ventricular diastolic parameters were normal. Right Ventricle: The right ventricular size is normal. No increase in right ventricular wall thickness. Right ventricular systolic function is normal. Tricuspid regurgitation signal is inadequate for assessing PA pressure. Left Atrium: Left atrial size was normal in size. Right Atrium: Right atrial size was normal in size. Pericardium: Trivial pericardial effusion is present. Presence of pericardial fat pad. Mitral  Valve: The mitral valve is grossly normal. No evidence of mitral valve regurgitation. No evidence of mitral valve stenosis. Tricuspid Valve: The tricuspid valve is grossly normal. Tricuspid valve regurgitation is trivial. No evidence of tricuspid stenosis. Aortic Valve: The aortic valve is tricuspid. Aortic valve regurgitation is not visualized. No aortic stenosis is present. Aortic valve mean gradient measures 8.0 mmHg. Aortic valve peak gradient measures 16.5 mmHg. Aortic valve area, by VTI measures 2.63  cm. Pulmonic Valve: The pulmonic valve was grossly normal. Pulmonic valve  regurgitation is not visualized. No evidence of pulmonic stenosis. Aorta: The aortic root is normal in size and structure. Venous: The right lower pulmonary vein is normal. The inferior vena cava is normal in size with greater than 50% respiratory variability, suggesting right atrial pressure of 3 mmHg. IAS/Shunts: The atrial septum is grossly normal.  LEFT VENTRICLE PLAX 2D LVIDd:         4.50 cm   Diastology LVIDs:         2.70 cm   LV e' medial:    11.10 cm/s LV PW:         1.10 cm   LV E/e' medial:  7.6 LV IVS:        1.10 cm   LV e' lateral:   12.30 cm/s LVOT diam:     2.00 cm   LV E/e' lateral: 6.8 LV SV:         97 LV SV Index:   38 LVOT Area:     3.14 cm  RIGHT VENTRICLE RV S prime:     21.10 cm/s TAPSE (M-mode): 3.2 cm LEFT ATRIUM             Index        RIGHT ATRIUM           Index LA diam:        3.60 cm 1.42 cm/m   RA Area:     11.70 cm LA Vol (A2C):   33.8 ml 13.34 ml/m  RA Volume:   26.20 ml  10.34 ml/m LA Vol (A4C):   48.7 ml 19.22 ml/m LA Biplane Vol: 42.7 ml 16.85 ml/m  AORTIC VALVE AV Area (Vmax):    2.62 cm AV Area (Vmean):   2.82 cm AV Area (VTI):     2.63 cm AV Vmax:           203.00 cm/s AV Vmean:          135.000 cm/s AV VTI:            0.371 m AV Peak Grad:      16.5 mmHg AV Mean Grad:      8.0 mmHg LVOT Vmax:         169.00 cm/s LVOT Vmean:        121.000 cm/s LVOT VTI:          0.310 m LVOT/AV VTI  ratio: 0.84  AORTA Ao Root diam: 3.40 cm MITRAL VALVE MV Area (PHT): 2.83 cm     SHUNTS MV Decel Time: 268 msec     Systemic VTI:  0.31 m MV E velocity: 83.90 cm/s   Systemic Diam: 2.00 cm MV A velocity: 112.00 cm/s MV E/A ratio:  0.75 Eleonore Chiquito MD Electronically signed by Eleonore Chiquito MD Signature Date/Time: 08/01/2021/4:20:57 PM    Final     Microbiology: Recent Results (from the past 240 hour(s))  Resp Panel by RT-PCR (Flu A&B, Covid) Nasopharyngeal Swab     Status: None   Collection Time: 07/31/21  9:58 PM   Specimen: Nasopharyngeal Swab; Nasopharyngeal(NP) swabs in vial transport medium  Result Value Ref Range Status   SARS Coronavirus 2 by RT PCR NEGATIVE NEGATIVE Final    Comment: (NOTE) SARS-CoV-2 target nucleic acids are NOT DETECTED.  The SARS-CoV-2 RNA is generally detectable in upper respiratory specimens during the acute phase of infection. The lowest concentration of SARS-CoV-2 viral copies this assay can detect is 138 copies/mL. A negative result does not preclude SARS-Cov-2 infection and should  not be used as the sole basis for treatment or other patient management decisions. A negative result may occur with  improper specimen collection/handling, submission of specimen other than nasopharyngeal swab, presence of viral mutation(s) within the areas targeted by this assay, and inadequate number of viral copies(<138 copies/mL). A negative result must be combined with clinical observations, patient history, and epidemiological information. The expected result is Negative.  Fact Sheet for Patients:  EntrepreneurPulse.com.au  Fact Sheet for Healthcare Providers:  IncredibleEmployment.be  This test is no t yet approved or cleared by the [place] FDA and  has been authorized for detection and/or diagnosis of SARS-CoV-2 by FDA under an Emergency Use Authorization (EUA). This EUA will remain  in effect (meaning this test can be  used) for the duration of the COVID-19 declaration under Section 564(b)(1) of the Act, 21 U.S.C.section 360bbb-3(b)(1), unless the authorization is terminated  or revoked sooner.       Influenza A by PCR NEGATIVE NEGATIVE Final   Influenza B by PCR NEGATIVE NEGATIVE Final    Comment: (NOTE) The Xpert Xpress SARS-CoV-2/FLU/RSV plus assay is intended as an aid in the diagnosis of influenza from Nasopharyngeal swab specimens and should not be used as a sole basis for treatment. Nasal washings and aspirates are unacceptable for Xpert Xpress SARS-CoV-2/FLU/RSV testing.  Fact Sheet for Patients: EntrepreneurPulse.com.au  Fact Sheet for Healthcare Providers: IncredibleEmployment.be  This test is not yet approved or cleared by the [place] FDA and has been authorized for detection and/or diagnosis of SARS-CoV-2 by FDA under an Emergency Use Authorization (EUA). This EUA will remain in effect (meaning this test can be used) for the duration of the COVID-19 declaration under Section 564(b)(1) of the Act, 21 U.S.C. section 360bbb-3(b)(1), unless the authorization is terminated or revoked.  Performed at [facility], [address], [place], [place] [address]      Labs: Basic Metabolic Panel: Recent Labs  Lab 07/28/21 1407 07/30/21 1233 07/31/21 1524 08/01/21 0530 08/02/21 0552  NA 134* 138 137 137 137  K 3.8 4.0 3.9 4.0 3.5  CL 100 102 98 101 101  CO2 26 24 29 29 27   GLUCOSE 120* 96 109* 100* 98  BUN 11 13 14 14 16   CREATININE 1.10 1.27* 1.41* 1.06 1.11  CALCIUM 9.2 9.7 8.9 8.3* 8.8*  MG  --   --   --  1.7  --   PHOS  --   --   --  3.2  --    Liver Function Tests: Recent Labs  Lab 07/28/21 1407 07/30/21 1233 07/31/21 1524 08/01/21 0530  AST 27 26 29 27   ALT 29 33 37 36  ALKPHOS 61 72 68 61  BILITOT 0.8 0.6 1.0 1.0  PROT 7.5 8.2* 7.8 7.0  ALBUMIN 3.6 3.9 4.0 3.5   No results for input(s): LIPASE, AMYLASE in the last 168  hours. No results for input(s): AMMONIA in the last 168 hours. CBC: Recent Labs  Lab 07/28/21 1407 07/30/21 1233 07/31/21 1524 08/01/21 0530 08/02/21 0552  WBC 7.9 8.7 8.1 8.3 10.0  NEUTROABS 5.2 5.7 5.7  --   --   HGB 14.3 14.3 14.4 13.1 13.4  HCT 44.0 42.8 43.7 40.5 39.9  MCV 92.2 89.4 94.0 93.8 92.4  PLT 278 284 270 246 249   Cardiac Enzymes: No results for input(s): CKTOTAL, CKMB, CKMBINDEX, TROPONINI in the last 168 hours. BNP: BNP (last 3 results) No results for input(s): BNP in the last 8760 hours.  ProBNP (last  3 results) No results for input(s): PROBNP in the last 8760 hours.  CBG: No results for input(s): GLUCAP in the last 168 hours.     Signed:  Alma Friendly, MD [facility] 08/02/2021, 4:31 PM

## 2021-08-02 NOTE — Progress Notes (Signed)
[facility] for heparin Indication: pulmonary embolus  Heparin Dosing Weight: 106.7kg  Labs: Recent Labs    07/31/21 1524 08/01/21 0530 08/01/21 1154 08/02/21 0552  HGB 14.4 13.1  --  13.4  HCT 43.7 40.5  --  39.9  PLT 270 246  --  249  APTT  --  89*  --   --   LABPROT  --  14.6  --   --   INR  --  1.1  --   --   HEPARINUNFRC  --  0.71* 0.66 0.58  CREATININE 1.41* 1.06  --  1.11     Assessment: 65 y.o. male recently diagnosed with lymphoma, s/p neck mass biopsy on 07/14/21 presenting with acute PE with no RHS. Pharmacy consulted to dose heparin. Patient is not on anticoagulation PTA. CBC wnl. SCr trend up to 1.41 (recently 1.1-1.3). No active bleed issues reported/ Patient  with PE on CTA, . No findings of right heart strain or pulmonary infarction.  HL 0.58, remains therapeutic. F/U ECHO  Goal of Therapy:  Heparin level 0.3-0.7 units/ml Monitor platelets by anticoagulation protocol: Yes   Plan:  Continue heparin at 1900 units/hr Check heparin level daily F/U transition to po anticoagulation Monitor daily CBC, s/sx bleeding  Thomasenia Sales, PharmD, MBA, BCGP Clinical Pharmacist  08/02/2021 7:39 AM

## 2021-08-02 NOTE — Plan of Care (Signed)
Vitals stable. Pt rested during overnight. Heparin continues at 19 ml/hr. Problem: Education: Goal: Knowledge of General Education information will improve Description: Including pain rating scale, medication(s)/side effects and non-pharmacologic comfort measures Outcome: Progressing   Problem: Health Behavior/Discharge Planning: Goal: Ability to manage health-related needs will improve Outcome: Progressing   Problem: Clinical Measurements: Goal: Ability to maintain clinical measurements within normal limits will improve Outcome: Progressing Goal: Will remain free from infection Outcome: Progressing Goal: Diagnostic test results will improve Outcome: Progressing Goal: Respiratory complications will improve Outcome: Progressing Goal: Cardiovascular complication will be avoided Outcome: Progressing   Problem: Activity: Goal: Risk for activity intolerance will decrease Outcome: Progressing   Problem: Nutrition: Goal: Adequate nutrition will be maintained Outcome: Progressing   Problem: Coping: Goal: Level of anxiety will decrease Outcome: Progressing   Problem: Elimination: Goal: Will not experience complications related to bowel motility Outcome: Progressing Goal: Will not experience complications related to urinary retention Outcome: Progressing   Problem: Pain Managment: Goal: General experience of comfort will improve Outcome: Progressing   Problem: Safety: Goal: Ability to remain free from injury will improve Outcome: Progressing   Problem: Skin Integrity: Goal: Risk for impaired skin integrity will decrease Outcome: Progressing

## 2021-08-02 NOTE — Progress Notes (Signed)
Discharge instructions given to patient.  Niece at bedside.  Education emphasized on how to take Apixaban as written in the discharge instructions.  Education on bleeding risk given as well.  Encouraged to call the doctor for questions.  Patient verbalized understanding.  Needs addressed, discharged home.

## 2021-08-02 NOTE — Evaluation (Signed)
Clinical/Bedside Swallow Evaluation Patient Details  Name: Joe [name] MRN: [id number] Date of Birth: [birth year]  Today's Date: 08/02/2021 Time: SLP Start Time (ACUTE ONLY): 1450 SLP Stop Time (ACUTE ONLY): 1520 SLP Time Calculation (min) (ACUTE ONLY): 30 min  Past Medical History:  Past Medical History:  Diagnosis Date   Anxiety    Arthritis    Cancer ([place])    COVID    x 2 (2020 and 2022)   Depression    Hypertension    Past Surgical History:  Past Surgical History:  Procedure Laterality Date   carpel tunnel     MASS BIOPSY Left 07/14/2021   Procedure: NECK MASS BIOPSY;  Surgeon: Melida Quitter, MD;  Location: [place];  Service: ENT;  Laterality: Left;   HPI:  Joe [name] is a 65 y.o. 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 Most of the history was obtained from niece at bedside, per report, patient has had about 4 episodes of syncope within last week.  First episode lasted several hours, he was taken to a hospital in [place], he was evaluated for syncope and noted to have elevated CO2 level per ED medical record (10/8).  Patient has since had 3 other episodes, lasted few seconds to a minute with return to baseline within few minutes and without any postictal state. EMS was activated, BP was checked and was hypotensive at 85 and this was associated with diaphoresis. In the ED, noted to be HD stable. Work-up in the ED showed elevated creatinine at 1.41 (baseline creatinine at 1.1-1.3), D-dimer 3.59.  Influenza A, B, SARS coronavirus 2 was negative. CTA chest showed PE. No findings of right heart strain or pulmonary infarction. Patient was started on heparin drip. Pt reports poor oral intake due to mass in neck and fear of choking. BSE requested.    Assessment  / Plan / Recommendation  Clinical Impression  Clinical swallow evaluation completed at bedside. Pt reports some solid food dysphagia in the past month due to rapidly growing left neck mass. His doctor recently prescribed steroids which has helped. He currently has hoarse vocal quality which started 2 days ago after a severe coughing episode (has PE). Pt self presented cup/straw sips thin water, applesauce, and graham crackers. He showed no overt signs of reduced airway protection, but does endorse need for multiple swallows for regular textures and benefit of liquid wash. See recent neck CT for pharyngeal impedence of neck mass, which is likely source for globus. Pt reports that he may be discharged later today or tomorrow. Recommend continuing self regulated regular textures (avoid dry, crumbly foods and/or add moisture to solids) and thin liquids, po medications whole in puree. Pt may benefit from a baseline MBSS which can be completed as an outpatient if his oncologist would like. Pt has plans for chemo, but not sure about radiation yet. SLP encouraged Pt and his niece to speak with his oncologist regarding any swallowing changes over the course of his treatment and possible referral for SLP for dysphagia as an outpatient as needed. Pt and niece are in agreement with plan of care. SLP Visit Diagnosis: Dysphagia, unspecified (R13.10)    Aspiration Risk  Mild aspiration risk    Diet Recommendation Regular;Dysphagia 3 (Mech soft);Thin liquid (Pt able to self regulate  regular textures at home)   Liquid Administration via: Cup Medication Administration: Whole meds with puree Supervision: Patient able to self feed Compensations: Multiple dry swallows after each bite/sip;Follow solids with liquid Postural Changes: Seated upright at 90 degrees;Remain upright for at least 30 minutes after po intake    Other  Recommendations Oral Care Recommendations: Oral care BID;Patient independent with oral care Other  Recommendations: Clarify dietary restrictions    Recommendations for follow up therapy are one component of a multi-disciplinary discharge planning process, led by the attending physician.  Recommendations may be updated based on patient status, additional functional criteria and insurance authorization.  Follow up Recommendations None;Other (comment) (consider outpt MBSS per oncologist pending treatment plan for neck mass)      Frequency and Duration            Prognosis Prognosis for Safe Diet Advancement: Good      Swallow Study   General Date of Onset: 07/31/21 HPI: Joe [name] is a 65 y.o. 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 Most of the history was obtained from niece at bedside, per report, patient has had about 4 episodes of syncope within last week.  First episode lasted several hours, he was taken to a hospital in [place], he was evaluated for syncope and noted to have elevated CO2 level per ED medical record (10/8).  Patient has since had 3 other episodes, lasted few seconds to a minute with return to baseline within few minutes and without any postictal state. EMS was activated, BP was checked and was hypotensive at 85 and this was associated with diaphoresis. In the ED, noted to be HD stable. Work-up in the ED showed elevated creatinine at 1.41 (baseline creatinine at 1.1-1.3), D-dimer 3.59.  Influenza A, B, SARS coronavirus 2 was negative. CTA chest showed PE. No findings of right heart strain or pulmonary infarction. Patient was started on heparin drip. Pt reports poor oral intake due to mass in neck and fear of choking. BSE requested. Type of Study: Bedside Swallow Evaluation Previous Swallow Assessment: N/A Diet Prior to this Study: Regular;Thin  liquids (RD recommended D3) Temperature Spikes Noted: No Respiratory Status: Room air History of Recent Intubation: No Behavior/Cognition: Alert;Cooperative;Pleasant mood Oral Cavity Assessment: Within Functional Limits Oral Care Completed by SLP: No Oral Cavity - Dentition: Adequate natural dentition Vision: Functional for self-feeding Self-Feeding Abilities: Able to feed self Patient Positioning: Upright in bed Baseline Vocal Quality: Hoarse (hoarse x2 days) Volitional Cough: Strong Volitional Swallow: Able to elicit    Oral/Motor/Sensory Function Overall Oral Motor/Sensory Function: Within functional limits   Ice Chips Ice chips: Within functional limits Presentation: Spoon   Thin Liquid Thin Liquid: Within functional limits Presentation: Cup;Self Fed;Straw    Nectar Thick Nectar Thick Liquid: Not tested   Honey Thick Honey Thick Liquid: Not tested   Puree Puree: Within functional limits Presentation: Self Fed;Spoon   Solid     Solid: Within functional limits Presentation: Self Fed Other Comments:  (Pt reports occasinal globus sensation with dry solids)     Thank you,  Genene Churn, [place]  Joe [name] 08/02/2021,3:37 PM

## 2021-08-03 ENCOUNTER — Telehealth: Payer: Self-pay | Admitting: Hematology

## 2021-08-03 NOTE — Telephone Encounter (Signed)
Called patient regarding schedule changes next week, spoke with patient's relative. Patient will be notified.

## 2021-08-06 ENCOUNTER — Inpatient Hospital Stay: Payer: BC Managed Care – PPO

## 2021-08-08 ENCOUNTER — Other Ambulatory Visit: Payer: Self-pay | Admitting: Hematology

## 2021-08-08 ENCOUNTER — Other Ambulatory Visit: Payer: Self-pay

## 2021-08-08 ENCOUNTER — Inpatient Hospital Stay: Payer: BC Managed Care – PPO

## 2021-08-08 DIAGNOSIS — C8221 Follicular lymphoma grade III, unspecified, lymph nodes of head, face, and neck: Secondary | ICD-10-CM

## 2021-08-08 DIAGNOSIS — Z7189 Other specified counseling: Secondary | ICD-10-CM

## 2021-08-08 NOTE — Progress Notes (Signed)
START ON PATHWAY REGIMEN - Lymphoma and CLL     A cycle is every 21 days:     Prednisone      Rituximab-xxxx      Cyclophosphamide      Doxorubicin      Vincristine   **Always confirm dose/schedule in your pharmacy ordering system**  Patient Characteristics: Diffuse Large B-Cell Lymphoma or Follicular Lymphoma, Grade 3B, First Line, Stage I and II, Bulky Disease (10 cm or > 1/3 Diameter of Chest) Disease Type: Follicular Lymphoma, Grade 3B Disease Type: Not Applicable Disease Type: Not Applicable Line of therapy: First Line Disease Characteristics: Bulky Disease Intent of Therapy: Curative Intent, Discussed with Patient

## 2021-08-09 ENCOUNTER — Inpatient Hospital Stay: Payer: BC Managed Care – PPO

## 2021-08-09 ENCOUNTER — Inpatient Hospital Stay (HOSPITAL_BASED_OUTPATIENT_CLINIC_OR_DEPARTMENT_OTHER): Payer: BC Managed Care – PPO | Admitting: Hematology

## 2021-08-09 ENCOUNTER — Other Ambulatory Visit: Payer: Self-pay

## 2021-08-09 VITALS — BP 150/91 | HR 89 | Temp 98.5°F | Resp 17 | Ht 69.0 in | Wt 320.4 lb

## 2021-08-09 DIAGNOSIS — F32A Depression, unspecified: Secondary | ICD-10-CM | POA: Diagnosis not present

## 2021-08-09 DIAGNOSIS — C8290 Follicular lymphoma, unspecified, unspecified site: Secondary | ICD-10-CM | POA: Insufficient documentation

## 2021-08-09 DIAGNOSIS — Z7901 Long term (current) use of anticoagulants: Secondary | ICD-10-CM | POA: Insufficient documentation

## 2021-08-09 DIAGNOSIS — I10 Essential (primary) hypertension: Secondary | ICD-10-CM | POA: Insufficient documentation

## 2021-08-09 DIAGNOSIS — C8221 Follicular lymphoma grade III, unspecified, lymph nodes of head, face, and neck: Secondary | ICD-10-CM | POA: Diagnosis not present

## 2021-08-09 DIAGNOSIS — G4733 Obstructive sleep apnea (adult) (pediatric): Secondary | ICD-10-CM | POA: Insufficient documentation

## 2021-08-09 DIAGNOSIS — I2693 Single subsegmental pulmonary embolism without acute cor pulmonale: Secondary | ICD-10-CM | POA: Diagnosis not present

## 2021-08-09 DIAGNOSIS — Z79899 Other long term (current) drug therapy: Secondary | ICD-10-CM | POA: Diagnosis not present

## 2021-08-09 DIAGNOSIS — F419 Anxiety disorder, unspecified: Secondary | ICD-10-CM | POA: Diagnosis not present

## 2021-08-09 LAB — CBC WITH DIFFERENTIAL (CANCER CENTER ONLY)
Abs Immature Granulocytes: 0.04 10*3/uL (ref 0.00–0.07)
Basophils Absolute: 0.1 10*3/uL (ref 0.0–0.1)
Basophils Relative: 0 %
Eosinophils Absolute: 0.1 10*3/uL (ref 0.0–0.5)
Eosinophils Relative: 1 %
HCT: 44.4 % (ref 39.0–52.0)
Hemoglobin: 15.4 g/dL (ref 13.0–17.0)
Immature Granulocytes: 0 %
Lymphocytes Relative: 27 %
Lymphs Abs: 3.7 10*3/uL (ref 0.7–4.0)
MCH: 30.3 pg (ref 26.0–34.0)
MCHC: 34.7 g/dL (ref 30.0–36.0)
MCV: 87.2 fL (ref 80.0–100.0)
Monocytes Absolute: 1.2 10*3/uL — ABNORMAL HIGH (ref 0.1–1.0)
Monocytes Relative: 9 %
Neutro Abs: 8.6 10*3/uL — ABNORMAL HIGH (ref 1.7–7.7)
Neutrophils Relative %: 63 %
Platelet Count: 342 10*3/uL (ref 150–400)
RBC: 5.09 MIL/uL (ref 4.22–5.81)
RDW: 14.6 % (ref 11.5–15.5)
WBC Count: 13.6 10*3/uL — ABNORMAL HIGH (ref 4.0–10.5)
nRBC: 0 % (ref 0.0–0.2)

## 2021-08-09 LAB — CMP (CANCER CENTER ONLY)
ALT: 43 U/L (ref 0–44)
AST: 20 U/L (ref 15–41)
Albumin: 3.8 g/dL (ref 3.5–5.0)
Alkaline Phosphatase: 80 U/L (ref 38–126)
Anion gap: 11 (ref 5–15)
BUN: 26 mg/dL — ABNORMAL HIGH (ref 8–23)
CO2: 25 mmol/L (ref 22–32)
Calcium: 9.5 mg/dL (ref 8.9–10.3)
Chloride: 102 mmol/L (ref 98–111)
Creatinine: 1.17 mg/dL (ref 0.61–1.24)
GFR, Estimated: >60 mL/min (ref >60)
Glucose, Bld: 95 mg/dL (ref 70–99)
Potassium: 3.9 mmol/L (ref 3.5–5.1)
Sodium: 138 mmol/L (ref 135–145)
Total Bilirubin: 0.5 mg/dL (ref 0.3–1.2)
Total Protein: 7.6 g/dL (ref 6.5–8.1)

## 2021-08-09 LAB — LACTATE DEHYDROGENASE: LDH: 382 U/L — ABNORMAL HIGH (ref 98–192)

## 2021-08-09 MED ORDER — CHLORHEXIDINE GLUCONATE 0.12 % MT SOLN: 15.0000 mL | Freq: Two times a day (BID) | OROMUCOSAL | 0 refills | Status: DC

## 2021-08-09 MED ORDER — ERGOCALCIFEROL 1.25 MG (50000 UT) PO CAPS: 50000.0000 [IU] | ORAL_CAPSULE | ORAL | 0 refills | Status: DC

## 2021-08-10 ENCOUNTER — Ambulatory Visit (HOSPITAL_COMMUNITY)
Admission: RE | Admit: 2021-08-10 | Discharge: 2021-08-10 | Disposition: A | Discharge status: Home / Self Care | Payer: BC Managed Care – PPO | Source: Ambulatory Visit | Attending: Hematology | Admitting: Hematology

## 2021-08-10 ENCOUNTER — Telehealth: Payer: Self-pay | Admitting: Hematology

## 2021-08-10 DIAGNOSIS — D492 Neoplasm of unspecified behavior of bone, soft tissue, and skin: Secondary | ICD-10-CM | POA: Diagnosis not present

## 2021-08-10 DIAGNOSIS — C8221 Follicular lymphoma grade III, unspecified, lymph nodes of head, face, and neck: Secondary | ICD-10-CM | POA: Insufficient documentation

## 2021-08-10 DIAGNOSIS — I2510 Atherosclerotic heart disease of native coronary artery without angina pectoris: Secondary | ICD-10-CM | POA: Diagnosis not present

## 2021-08-10 DIAGNOSIS — C8290 Follicular lymphoma, unspecified, unspecified site: Secondary | ICD-10-CM | POA: Diagnosis not present

## 2021-08-10 DIAGNOSIS — K4020 Bilateral inguinal hernia, without obstruction or gangrene, not specified as recurrent: Secondary | ICD-10-CM | POA: Diagnosis not present

## 2021-08-10 LAB — GLUCOSE, CAPILLARY: Glucose-Capillary: 121 mg/dL — ABNORMAL HIGH (ref 70–99)

## 2021-08-10 MED ORDER — FLUDEOXYGLUCOSE F - 18 (FDG) INJECTION
16.0000 | Freq: Once | INTRAVENOUS | Status: AC
  Administered 2021-08-10: 16.9 via INTRAVENOUS

## 2021-08-10 NOTE — Telephone Encounter (Signed)
Scheduled follow-up appointment per 10/20 los. Patient's niece is aware.

## 2021-08-11 ENCOUNTER — Inpatient Hospital Stay: Payer: BC Managed Care – PPO

## 2021-08-14 ENCOUNTER — Other Ambulatory Visit: Payer: Self-pay

## 2021-08-14 ENCOUNTER — Ambulatory Visit (HOSPITAL_COMMUNITY): Payer: BC Managed Care – PPO

## 2021-08-14 ENCOUNTER — Encounter (HOSPITAL_COMMUNITY): Payer: Self-pay

## 2021-08-14 ENCOUNTER — Ambulatory Visit (HOSPITAL_COMMUNITY)
Admission: RE | Admit: 2021-08-14 | Discharge: 2021-08-14 | Disposition: A | Discharge status: Home / Self Care | Payer: BC Managed Care – PPO | Source: Ambulatory Visit | Attending: Hematology | Admitting: Hematology

## 2021-08-14 ENCOUNTER — Other Ambulatory Visit: Payer: Self-pay | Admitting: Hematology

## 2021-08-14 ENCOUNTER — Other Ambulatory Visit (HOSPITAL_COMMUNITY): Payer: BC Managed Care – PPO

## 2021-08-14 DIAGNOSIS — G4489 Other headache syndrome: Secondary | ICD-10-CM | POA: Diagnosis not present

## 2021-08-14 DIAGNOSIS — C8291 Follicular lymphoma, unspecified, lymph nodes of head, face, and neck: Secondary | ICD-10-CM | POA: Diagnosis not present

## 2021-08-14 DIAGNOSIS — F419 Anxiety disorder, unspecified: Secondary | ICD-10-CM | POA: Diagnosis not present

## 2021-08-14 DIAGNOSIS — T451X5A Adverse effect of antineoplastic and immunosuppressive drugs, initial encounter: Secondary | ICD-10-CM | POA: Diagnosis not present

## 2021-08-14 DIAGNOSIS — D701 Agranulocytosis secondary to cancer chemotherapy: Secondary | ICD-10-CM | POA: Diagnosis not present

## 2021-08-14 DIAGNOSIS — I2690 Septic pulmonary embolism without acute cor pulmonale: Secondary | ICD-10-CM | POA: Diagnosis not present

## 2021-08-14 DIAGNOSIS — C8281 Other types of follicular lymphoma, lymph nodes of head, face, and neck: Secondary | ICD-10-CM | POA: Diagnosis not present

## 2021-08-14 DIAGNOSIS — Z79899 Other long term (current) drug therapy: Secondary | ICD-10-CM | POA: Diagnosis not present

## 2021-08-14 DIAGNOSIS — Z8616 Personal history of COVID-19: Secondary | ICD-10-CM | POA: Diagnosis not present

## 2021-08-14 DIAGNOSIS — Z7901 Long term (current) use of anticoagulants: Secondary | ICD-10-CM | POA: Diagnosis not present

## 2021-08-14 DIAGNOSIS — G4734 Idiopathic sleep related nonobstructive alveolar hypoventilation: Secondary | ICD-10-CM | POA: Diagnosis not present

## 2021-08-14 DIAGNOSIS — Z7189 Other specified counseling: Secondary | ICD-10-CM

## 2021-08-14 DIAGNOSIS — I2699 Other pulmonary embolism without acute cor pulmonale: Secondary | ICD-10-CM | POA: Diagnosis not present

## 2021-08-14 DIAGNOSIS — G4733 Obstructive sleep apnea (adult) (pediatric): Secondary | ICD-10-CM | POA: Diagnosis not present

## 2021-08-14 DIAGNOSIS — J9612 Chronic respiratory failure with hypercapnia: Secondary | ICD-10-CM | POA: Diagnosis not present

## 2021-08-14 DIAGNOSIS — I10 Essential (primary) hypertension: Secondary | ICD-10-CM | POA: Diagnosis not present

## 2021-08-14 DIAGNOSIS — I82409 Acute embolism and thrombosis of unspecified deep veins of unspecified lower extremity: Secondary | ICD-10-CM | POA: Diagnosis not present

## 2021-08-14 DIAGNOSIS — C8221 Follicular lymphoma grade III, unspecified, lymph nodes of head, face, and neck: Secondary | ICD-10-CM | POA: Diagnosis not present

## 2021-08-14 DIAGNOSIS — R531 Weakness: Secondary | ICD-10-CM | POA: Diagnosis not present

## 2021-08-14 DIAGNOSIS — R0600 Dyspnea, unspecified: Secondary | ICD-10-CM | POA: Diagnosis not present

## 2021-08-14 DIAGNOSIS — Z452 Encounter for adjustment and management of vascular access device: Secondary | ICD-10-CM | POA: Diagnosis not present

## 2021-08-14 DIAGNOSIS — R55 Syncope and collapse: Secondary | ICD-10-CM | POA: Diagnosis not present

## 2021-08-14 DIAGNOSIS — R222 Localized swelling, mass and lump, trunk: Secondary | ICD-10-CM | POA: Diagnosis not present

## 2021-08-14 DIAGNOSIS — F32A Depression, unspecified: Secondary | ICD-10-CM | POA: Diagnosis not present

## 2021-08-14 DIAGNOSIS — C8590 Non-Hodgkin lymphoma, unspecified, unspecified site: Secondary | ICD-10-CM | POA: Diagnosis not present

## 2021-08-14 DIAGNOSIS — Z87891 Personal history of nicotine dependence: Secondary | ICD-10-CM | POA: Diagnosis not present

## 2021-08-14 DIAGNOSIS — J398 Other specified diseases of upper respiratory tract: Secondary | ICD-10-CM | POA: Diagnosis not present

## 2021-08-14 DIAGNOSIS — Z6841 Body Mass Index (BMI) 40.0 and over, adult: Secondary | ICD-10-CM | POA: Diagnosis not present

## 2021-08-14 DIAGNOSIS — E785 Hyperlipidemia, unspecified: Secondary | ICD-10-CM | POA: Diagnosis not present

## 2021-08-14 DIAGNOSIS — J9859 Other diseases of mediastinum, not elsewhere classified: Secondary | ICD-10-CM | POA: Diagnosis not present

## 2021-08-14 DIAGNOSIS — C8251 Diffuse follicle center lymphoma, lymph nodes of head, face, and neck: Secondary | ICD-10-CM | POA: Diagnosis not present

## 2021-08-14 DIAGNOSIS — D709 Neutropenia, unspecified: Secondary | ICD-10-CM | POA: Diagnosis not present

## 2021-08-14 DIAGNOSIS — G589 Mononeuropathy, unspecified: Secondary | ICD-10-CM | POA: Diagnosis not present

## 2021-08-14 DIAGNOSIS — R0902 Hypoxemia: Secondary | ICD-10-CM | POA: Diagnosis not present

## 2021-08-14 DIAGNOSIS — R221 Localized swelling, mass and lump, neck: Secondary | ICD-10-CM | POA: Diagnosis not present

## 2021-08-14 DIAGNOSIS — Z7952 Long term (current) use of systemic steroids: Secondary | ICD-10-CM | POA: Diagnosis not present

## 2021-08-14 DIAGNOSIS — C8591 Non-Hodgkin lymphoma, unspecified, lymph nodes of head, face, and neck: Secondary | ICD-10-CM | POA: Diagnosis not present

## 2021-08-14 DIAGNOSIS — Z20822 Contact with and (suspected) exposure to covid-19: Secondary | ICD-10-CM | POA: Diagnosis not present

## 2021-08-14 DIAGNOSIS — Z5111 Encounter for antineoplastic chemotherapy: Secondary | ICD-10-CM | POA: Diagnosis not present

## 2021-08-14 DIAGNOSIS — E662 Morbid (severe) obesity with alveolar hypoventilation: Secondary | ICD-10-CM | POA: Diagnosis not present

## 2021-08-14 MED ORDER — ONDANSETRON HCL 8 MG PO TABS: 8.0000 mg | ORAL_TABLET | Freq: Two times a day (BID) | ORAL | 1 refills | Status: DC | PRN

## 2021-08-14 MED ORDER — PROCHLORPERAZINE MALEATE 10 MG PO TABS: 10.0000 mg | ORAL_TABLET | Freq: Four times a day (QID) | ORAL | 6 refills | Status: DC | PRN

## 2021-08-14 MED ORDER — LIDOCAINE HCL 1 % IJ SOLN
INTRAMUSCULAR | Status: AC
  Administered 2021-08-14: 2 mL via SUBCUTANEOUS
  Filled 2021-08-14: qty 20

## 2021-08-14 MED ORDER — HEPARIN SOD (PORK) LOCK FLUSH 100 UNIT/ML IV SOLN
INTRAVENOUS | Status: AC
  Administered 2021-08-14: 5 [IU]
  Filled 2021-08-14: qty 5

## 2021-08-14 MED ORDER — PREDNISONE 20 MG PO TABS: 60.0000 mg | ORAL_TABLET | Freq: Every day | ORAL | 5 refills | Status: DC

## 2021-08-14 NOTE — Procedures (Signed)
Pre procedural Diagnosis: Poor venous access Post Procedural Diagnosis: Same  Successful placement of right brachial vein approach 40 cm dual lumen PICC line with tip at the superior caval-atrial junction.    EBL: None No immediate post procedural complication.  The PICC line is ready for immediate use.  Ronny Bacon, MD Pager #: [phone]

## 2021-08-15 ENCOUNTER — Telehealth: Payer: Self-pay | Admitting: Hematology

## 2021-08-15 ENCOUNTER — Inpatient Hospital Stay (HOSPITAL_COMMUNITY)
Admission: EM | Admit: 2021-08-15 | Discharge: 2021-08-25 | DRG: 840 | Disposition: A | Discharge status: Home Health | Payer: BC Managed Care – PPO | Attending: Internal Medicine | Admitting: Internal Medicine

## 2021-08-15 ENCOUNTER — Other Ambulatory Visit: Payer: Self-pay

## 2021-08-15 ENCOUNTER — Encounter: Payer: Self-pay | Admitting: Hematology

## 2021-08-15 ENCOUNTER — Emergency Department (HOSPITAL_COMMUNITY): Payer: BC Managed Care – PPO

## 2021-08-15 ENCOUNTER — Encounter (HOSPITAL_COMMUNITY): Payer: Self-pay | Admitting: Emergency Medicine

## 2021-08-15 DIAGNOSIS — R55 Syncope and collapse: Secondary | ICD-10-CM | POA: Diagnosis present

## 2021-08-15 DIAGNOSIS — E662 Morbid (severe) obesity with alveolar hypoventilation: Secondary | ICD-10-CM | POA: Diagnosis present

## 2021-08-15 DIAGNOSIS — I10 Essential (primary) hypertension: Secondary | ICD-10-CM | POA: Diagnosis not present

## 2021-08-15 DIAGNOSIS — Z7952 Long term (current) use of systemic steroids: Secondary | ICD-10-CM

## 2021-08-15 DIAGNOSIS — Z20822 Contact with and (suspected) exposure to covid-19: Secondary | ICD-10-CM | POA: Diagnosis present

## 2021-08-15 DIAGNOSIS — C8290 Follicular lymphoma, unspecified, unspecified site: Secondary | ICD-10-CM | POA: Diagnosis present

## 2021-08-15 DIAGNOSIS — C8291 Follicular lymphoma, unspecified, lymph nodes of head, face, and neck: Secondary | ICD-10-CM | POA: Diagnosis not present

## 2021-08-15 DIAGNOSIS — R221 Localized swelling, mass and lump, neck: Secondary | ICD-10-CM

## 2021-08-15 DIAGNOSIS — E785 Hyperlipidemia, unspecified: Secondary | ICD-10-CM | POA: Diagnosis present

## 2021-08-15 DIAGNOSIS — C8281 Other types of follicular lymphoma, lymph nodes of head, face, and neck: Principal | ICD-10-CM | POA: Diagnosis present

## 2021-08-15 DIAGNOSIS — Z7189 Other specified counseling: Secondary | ICD-10-CM

## 2021-08-15 DIAGNOSIS — C8591 Non-Hodgkin lymphoma, unspecified, lymph nodes of head, face, and neck: Secondary | ICD-10-CM

## 2021-08-15 DIAGNOSIS — Z7901 Long term (current) use of anticoagulants: Secondary | ICD-10-CM

## 2021-08-15 DIAGNOSIS — F419 Anxiety disorder, unspecified: Secondary | ICD-10-CM | POA: Diagnosis present

## 2021-08-15 DIAGNOSIS — F32A Depression, unspecified: Secondary | ICD-10-CM | POA: Diagnosis present

## 2021-08-15 DIAGNOSIS — Z79899 Other long term (current) drug therapy: Secondary | ICD-10-CM

## 2021-08-15 DIAGNOSIS — Z8616 Personal history of COVID-19: Secondary | ICD-10-CM

## 2021-08-15 DIAGNOSIS — E66813 Obesity, class 3: Secondary | ICD-10-CM

## 2021-08-15 DIAGNOSIS — Z87891 Personal history of nicotine dependence: Secondary | ICD-10-CM

## 2021-08-15 DIAGNOSIS — D709 Neutropenia, unspecified: Secondary | ICD-10-CM | POA: Diagnosis not present

## 2021-08-15 DIAGNOSIS — J9612 Chronic respiratory failure with hypercapnia: Secondary | ICD-10-CM | POA: Diagnosis present

## 2021-08-15 DIAGNOSIS — Z6841 Body Mass Index (BMI) 40.0 and over, adult: Secondary | ICD-10-CM

## 2021-08-15 DIAGNOSIS — I2699 Other pulmonary embolism without acute cor pulmonale: Secondary | ICD-10-CM | POA: Diagnosis present

## 2021-08-15 DIAGNOSIS — C8221 Follicular lymphoma grade III, unspecified, lymph nodes of head, face, and neck: Secondary | ICD-10-CM

## 2021-08-15 DIAGNOSIS — G4733 Obstructive sleep apnea (adult) (pediatric): Secondary | ICD-10-CM | POA: Diagnosis present

## 2021-08-15 DIAGNOSIS — Z5111 Encounter for antineoplastic chemotherapy: Secondary | ICD-10-CM

## 2021-08-15 DIAGNOSIS — G589 Mononeuropathy, unspecified: Secondary | ICD-10-CM | POA: Diagnosis present

## 2021-08-15 LAB — COMPREHENSIVE METABOLIC PANEL
ALT: 46 U/L — ABNORMAL HIGH (ref 0–44)
AST: 27 U/L (ref 15–41)
Albumin: 3.7 g/dL (ref 3.5–5.0)
Alkaline Phosphatase: 65 U/L (ref 38–126)
Anion gap: 7 (ref 5–15)
BUN: 22 mg/dL (ref 8–23)
CO2: 31 mmol/L (ref 22–32)
Calcium: 8.8 mg/dL — ABNORMAL LOW (ref 8.9–10.3)
Chloride: 98 mmol/L (ref 98–111)
Creatinine, Ser: 1.17 mg/dL (ref 0.61–1.24)
GFR, Estimated: >60 mL/min (ref >60)
Glucose, Bld: 129 mg/dL — ABNORMAL HIGH (ref 70–99)
Potassium: 4.2 mmol/L (ref 3.5–5.1)
Sodium: 136 mmol/L (ref 135–145)
Total Bilirubin: 0.6 mg/dL (ref 0.3–1.2)
Total Protein: 7.4 g/dL (ref 6.5–8.1)

## 2021-08-15 LAB — BRAIN NATRIURETIC PEPTIDE: B Natriuretic Peptide: 25.7 pg/mL (ref 0.0–100.0)

## 2021-08-15 LAB — CBC WITH DIFFERENTIAL/PLATELET
Abs Immature Granulocytes: 0.06 10*3/uL (ref 0.00–0.07)
Basophils Absolute: 0.1 10*3/uL (ref 0.0–0.1)
Basophils Relative: 0 %
Eosinophils Absolute: 0.2 10*3/uL (ref 0.0–0.5)
Eosinophils Relative: 1 %
HCT: 44.4 % (ref 39.0–52.0)
Hemoglobin: 14.9 g/dL (ref 13.0–17.0)
Immature Granulocytes: 0 %
Lymphocytes Relative: 10 %
Lymphs Abs: 1.4 10*3/uL (ref 0.7–4.0)
MCH: 31.1 pg (ref 26.0–34.0)
MCHC: 33.6 g/dL (ref 30.0–36.0)
MCV: 92.7 fL (ref 80.0–100.0)
Monocytes Absolute: 0.9 10*3/uL (ref 0.1–1.0)
Monocytes Relative: 6 %
Neutro Abs: 11.7 10*3/uL — ABNORMAL HIGH (ref 1.7–7.7)
Neutrophils Relative %: 83 %
Platelets: 300 10*3/uL (ref 150–400)
RBC: 4.79 MIL/uL (ref 4.22–5.81)
RDW: 15.2 % (ref 11.5–15.5)
WBC: 14.3 10*3/uL — ABNORMAL HIGH (ref 4.0–10.5)
nRBC: 0 % (ref 0.0–0.2)

## 2021-08-15 LAB — RESP PANEL BY RT-PCR (FLU A&B, COVID) ARPGX2
Influenza A by PCR: NEGATIVE
Influenza B by PCR: NEGATIVE
SARS Coronavirus 2 by RT PCR: NEGATIVE

## 2021-08-15 MED ORDER — IOHEXOL 350 MG/ML SOLN
150.0000 mL | Freq: Once | INTRAVENOUS | Status: AC | PRN
  Administered 2021-08-15: 150 mL via INTRAVENOUS

## 2021-08-15 MED ORDER — LACTATED RINGERS IV BOLUS
1000.0000 mL | Freq: Once | INTRAVENOUS | Status: AC
  Administered 2021-08-15: 1000 mL via INTRAVENOUS

## 2021-08-15 NOTE — ED Provider Notes (Signed)
Emergency Medicine Provider Triage Evaluation Note  Joe [name] , a 65 y.o. male  was evaluated in triage.  Pt complains of syncope and hypoxia.  History of provided mostly by patient's niece.  She states he has a history of recurrent syncope due to a mass on his neck due to non-Hodgkin's lymphoma.  Today he had 2 episodes of syncope, first 1 was mild and patient quickly returned to baseline.  Second 1 was longer and when checking his oxygenation, sats were in the 70s.  Patient subsequently returned to baseline, and oxygenation has been good since.  Patient reports no symptoms at this time.  States it feels similar to all of his other syncope episodes.  Patient has had some mild swelling of both legs.  Recently diagnosed PEs, is on a blood thinner and has had no missed doses.  No fever, cough, chest pain.  Review of Systems  Positive: Syncope, hypoxia Negative: Fever, cough  Physical Exam  BP 128/73 (BP Location: Left Arm)   Pulse 74   Temp 97.6 F (36.4 C) (Oral)   Resp 18   Ht 5\' 9"  (2.725 m)   Wt (!) 144.7 kg   SpO2 97%   BMI 47.11 kg/m  Gen:   Awake, no distress   Resp:  Normal effort.  Expiratory wheezes. MSK:   Moves extremities without difficulty  Other:   Large mass of the L side neck.    Medical Decision Making  Medically screening exam initiated at 1:43 PM.  Appropriate orders placed.  [name] was informed that the remainder of the evaluation will be completed by another provider, this initial triage assessment does not replace that evaluation, and the importance of remaining in the ED until their evaluation is complete.  Labs, cxr   Franchot Heidelberg, PA-C 08/15/21 1345    Teressa Lower, MD 08/15/21 [phone]

## 2021-08-15 NOTE — Telephone Encounter (Signed)
Hematology Oncology Short Note  I was informed by my nurse that the patient along with family is on the way to [facility] emergency room for another episode of presyncope/syncope. Patient has a large left neck high-grade lymphoma extending into the superior mediastinum. He also has had recent pulmonary embolism and is currently on Eliquis as per outside ED physician. Recommendations -Evaluation for presyncope/syncope as per emergency room physician. -Concerned that the primary tumor is pushing on his carotid artery in the neck and potentially causing vascular compression at the thoracic outlet. -PET/CT reviewed -Patient was scheduled to start outpatient chemoimmunotherapy with R-CHOP tomorrow but in light of his recurrent presyncopal/syncopal events I would recommend admitting the patient to the hospital for consideration of his first cycle of chemoimmunotherapy as inpatient. -He might need repeat CTA chest to evaluate for PE progression given his body weight being more than 130 kg puts him at increased risk of failure on DOAC's.  Would recommend putting him on Lovenox instead. -I shall be seeing the patient tomorrow morning to define further oncologic plan. -Please expedite admission to hospital medicine.  Joe [name]

## 2021-08-15 NOTE — Subjective & Objective (Signed)
CC: recurrent syncope HPI: 65 year old male with a history of hypertension, obesity, OSA who presents to the ER today with episodes of recurrent syncope.  Patient with a history of a recently diagnosed follicular lymphoma.  He was scheduled for chemotherapy starting tomorrow.  His right upper extremity PICC line.  He been recently discharged about 2 weeks ago after having a pulmonary embolism.  He has been on Eliquis since then.  Patient states that today while he was sitting down, he had a sharp stabbing pain up the left side of his neck.  This which the top of his head.  He states that he passed out from the pain.  Of note, patient has a large lymphoma in the left side of the neck.  CT angio of the neck today demonstrates that significant interval increase in the mass.  He also may have a right sigmoid sinus nonocclusive thrombus.  There is also deviation of his trachea to the right-hand side.  Work-up in the ER has been without cause for his syncope.  Patient has been taking prednisone for the last 2 weeks.  He he states that he does not know his current prednisone dose.  Due to his recurrent syncope, his outpatient hematologist sent him to the ER for evaluation and admission.  [facility] hospitalist contacted for admission.

## 2021-08-15 NOTE — Progress Notes (Addendum)
[name]   HEMATOLOGY/ONCOLOGY CLINIC NOTE  Date of Service: .08/09/2021   Patient Care Team: Celene Squibb, MD as PCP - General (Internal Medicine)  CHIEF COMPLAINTS/PURPOSE OF CONSULTATION:  Left neck mass/newly diagnosed non-Hodgkin's lymphoma.  HISTORY OF PRESENTING ILLNESS:  Joe [name] is a wonderful 65 y.o. male who has been referred to [place] by Dr .[place] Crane, Edwinna Areola, MD for evaluation and management of newly diagnosed non-Hodgkin's lymphoma.  Patient has a history of morbid obesity (.Body mass index is 47.31 kg/m.),  Sleep apnea not currently on CPAP ,arthritis, anxiety, hypertension, depression and first presented to the emergency room on 07/09/2021 with newly noted left-sided neck mass which showed up over a week.  He initially went to urgent care and was treated with Augmentin for possible infectious etiology but came to the emergency room when his left neck mass continued to get larger and more uncomfortable.  He notes some difficulty with breathing also at times feels like it is a little more difficult to swallow. Patient had a CT soft tissue of the neck with contrast on 07/09/2021 which showed 1. Large heterogeneous soft tissue mass measuring 7.9 x 6.1 by 8.2 cm in the left neck, extending from the skull base/parapharyngeal space inferiorly to the supraclavicular fossa, located immediately subjacent to and inseparable from the left sternocleidomastoid. The left internal jugular vein is obliterated by the mass and left carotid is inseparable from the mass, poorly visualized. Findings could represent malignancy or infectious phlegmon. Soft tissue sampling could provide more definitive diagnosis. 2. Nonspecific mild surrounding stranding and scattered prominent lymph nodes throughout the neck.  Patient was referred to ENT and had a surgical biopsy of the left neck mass by Dr. Melida Quitter on 07/14/2021 which showed Non-Hodgkin B-cell lymphoma, The majority of the fragments have extensive crush  artifact hampering  evaluation. One fragment is well preserved and demonstrates an atypical  lymphoid proliferation with predominately small irregular lymphocytes.  Immunohistochemistry is positive for CD20, bcl-2, bcl-6 and CD10. CD23  is largely negative. CyclinD1 and light chain in situ hybridization are  negative. Ki-67 is 20-25%. CD3 and CD5 highlight scattered T-cells. Flow  cytometry ([id number]) reveals predominately T-cells. Overall, the  findings are consistent with a non-Hodgkin B-cell lymphoma, specifically  follicular lymphoma. There is only limited preserved tissue hampering grading, but this area appears low grade (grade 1-2 of 3). A higher grade process in the crushed fragments cannot be excluded.  Patient again presented to the emergency room on 07/28/2021 with an episode of shaking reportedly some confusion to rule out seizure.  He had a CT of the head which showed no acute intracranial process.  Patient is here to discuss further evaluation and management of his newly diagnosed follicular lymphoma.  He notes some increased fatigue but no overt fevers chills night sweats or unexpected sudden weight loss. No other overt new focal symptoms other than in the neck. No new chest pain or shortness of breath. No abdominal pain, abdominal distention or change in bowel habits.  Patient notes no previous known heart issues. Has had COVID-19 in 2020 and 2022 denies any chronic lung issues. Does appear to have significant sleep apnea based on symptoms and known history of previous sleep apnea but has never been on CPAP.  His episodes of confusion difficulty waking up and difficulty waking up from sedation there is certainly a strong concern for untreated sleep apnea.  Patient and his wife notes that he has been scheduled for a sleep study.  INTERVAL  HISTORY  Patient is here for follow-up regarding his newly diagnosed high-grade follicular lymphoma.  He notes some decrease in size of the lymphoma  with the use of steroids and some decrease in his dysphagia.  Notes he has been eating better as a result. He was admitted at an outside hospital with another episode of presyncope/syncope and was noted to have pulmonary embolism uncertain that that is the cause of his syncopal symptoms.  He has been started on Eliquis.  No chest pain or shortness of breath. Port has not been placed yet and since he is on anticoagulation now we switched to plan to PICC line placement. PET CT scan scheduled for 10/21. Nursing staff not comfortable with giving him the first cycle of R-CHOP to peripheral IV line and so this was moved from 10/20 to 08/13/2021. Patient notes no other infectious issues.  MEDICAL HISTORY:  Past Medical History:  Diagnosis Date   Anxiety    Arthritis    Cancer ([place])    COVID    x 2 (2020 and 2022)   Depression    Hypertension     SURGICAL HISTORY: Past Surgical History:  Procedure Laterality Date   carpel tunnel     MASS BIOPSY Left 07/14/2021   Procedure: NECK MASS BIOPSY;  Surgeon: Melida Quitter, MD;  Location: [facility];  Service: ENT;  Laterality: Left;    SOCIAL HISTORY: Social History   Socioeconomic History   Marital status: Widowed    Spouse name: Not on file   Number of children: Not on file   Years of education: Not on file   Highest education level: Not on file  Occupational History   Not on file  Tobacco Use   Smoking status: Former    Types: Cigarettes   Smokeless tobacco: Former    Types: Chew  Substance and Sexual Activity   Alcohol use: Yes    Comment: rare   Drug use: No   Sexual activity: Not on file  Other Topics Concern   Not on file  Social History Narrative   Not on file   Social Determinants of Health   Financial Resource Strain: Not on file  Food Insecurity: Not on file  Transportation Needs: Not on file  Physical Activity: Not on file  Stress: Not on file  Social Connections: Not on file  Intimate Partner Violence: Not on file     FAMILY HISTORY: Family History  Problem Relation Age of Onset   Healthy Mother    Healthy Father     ALLERGIES:  has No Known Allergies.  MEDICATIONS:  No current facility-administered medications for this visit.   Current Outpatient Medications  Medication Sig Dispense Refill   chlorhexidine (PERIDEX) 0.12 % solution Use as directed 15 mLs in the mouth or throat 2 (two) times daily for 14 days. Swish and spit 473 mL 0   ergocalciferol (VITAMIN D2) 1.25 MG (50000 UT) capsule Take 1 capsule (50,000 Units total) by mouth once a week. (Patient taking differently: Take 50,000 Units by mouth once a week. on Wednesday) 12 capsule 0   allopurinol (ZYLOPRIM) 300 MG tablet Take 300 mg by mouth daily.     amLODipine (NORVASC) 10 MG tablet Take 10 mg by mouth daily.     APIXABAN (ELIQUIS) VTE STARTER PACK ($RemoveBefor'10MG'eStcnaWRDUWx$  AND $Re'5MG'ZRD$ ) Take as directed on package: start with two-$RemoveBefore'5mg'ueRteMFNqtSzF$  tablets twice daily for 7 days. On day 8, switch to one-$RemoveBefor'5mg'xafgjSHpLvKk$  tablet twice daily. 1 each 0   Multiple Vitamin (MULTIVITAMIN  WITH MINERALS) TABS tablet Take 1 tablet by mouth daily. 30 tablet 0   ondansetron (ZOFRAN) 8 MG tablet Take 1 tablet (8 mg total) by mouth 2 (two) times daily as needed for refractory nausea / vomiting. Start on day 3 after cyclophosphamide chemotherapy. 30 tablet 1   predniSONE (DELTASONE) 20 MG tablet Take 3 tablets (60 mg total) by mouth daily with breakfast for 5 days, THEN 2 tablets (40 mg total) daily with breakfast for 7 days, THEN 1 tablet (20 mg total) daily with breakfast for 10 days. 39 tablet 0   predniSONE (DELTASONE) 20 MG tablet Take 3 tablets (60 mg total) by mouth daily. Take with food on days 2-6 of chemotherapy. 25 tablet 5   prochlorperazine (COMPAZINE) 10 MG tablet Take 1 tablet (10 mg total) by mouth every 6 (six) hours as needed (Nausea or vomiting). 30 tablet 6   sertraline (ZOLOFT) 50 MG tablet Take 50 mg by mouth daily.     simvastatin (ZOCOR) 40 MG tablet Take 40 mg by mouth 3 (three)  times a week. (Patient not taking: Reported on 08/16/2021)     Facility-Administered Medications Ordered in Other Visits  Medication Dose Route Frequency Provider Last Rate Last Admin   acetaminophen (TYLENOL) tablet 650 mg  650 mg Oral Q6H PRN Kristopher Oppenheim, DO       Or   acetaminophen (TYLENOL) suppository 650 mg  650 mg Rectal Q6H PRN Kristopher Oppenheim, DO       allopurinol (ZYLOPRIM) tablet 300 mg  300 mg Oral Daily Kristopher Oppenheim, DO   300 mg at 08/16/21 1113   amLODipine (NORVASC) tablet 10 mg  10 mg Oral Daily Kristopher Oppenheim, DO   10 mg at 08/16/21 1113   enoxaparin (LOVENOX) injection 144 mg  1 mg/kg Subcutaneous Q12H Kristopher Oppenheim, DO   144 mg at 08/16/21 1946   prochlorperazine (COMPAZINE) injection 10 mg  10 mg Intravenous Q6H PRN Kristopher Oppenheim, DO       sertraline (ZOLOFT) tablet 50 mg  50 mg Oral Daily Richarda Osmond, MD   50 mg at 08/16/21 1431   simvastatin (ZOCOR) tablet 40 mg  40 mg Oral Once per day on Mon Wed Fri Richarda Osmond, MD        REVIEW OF SYSTEMS:   .10 Point review of Systems was done is negative except as noted above.   PHYSICAL EXAMINATION: ECOG PERFORMANCE STATUS: 1 - Symptomatic but completely ambulatory  . Vitals:   08/09/21 1013  BP: (!) 150/91  Pulse: 89  Resp: 17  Temp: 98.5 F (36.9 C)  SpO2: 98%   Filed Weights   08/09/21 1013  Weight: (!) 320 lb 6.4 oz (145.3 kg)   .Body mass index is 47.31 kg/m.  [name] GENERAL:alert, in no acute distress and comfortable SKIN: no acute rashes, no significant lesions EYES: conjunctiva are pink and non-injected, sclera anicteric OROPHARYNX: MMM, no exudates, no oropharyngeal erythema or ulceration NECK: supple, no JVD LYMPH:  Large left neck palpable lymphadenopathy .  Smaller right neck lymphadenopathy . LUNGS: clear to auscultation b/l with normal respiratory effort HEART: regular rate & rhythm ABDOMEN:  normoactive bowel sounds , non tender, not distended. Extremity: no pedal edema PSYCH: alert & oriented x 3  with fluent speech NEURO: no focal motor/sensory deficits   LABORATORY DATA:  I have reviewed the data as listed  . CBC Latest Ref Rng & Units 08/16/2021 08/15/2021 08/09/2021  WBC 4.0 - 10.5 K/uL 13.7(H) 14.3(H) 13.6(H)  Hemoglobin 13.0 - 17.0 g/dL 13.7 14.9 15.4  Hematocrit 39.0 - 52.0 % 41.5 44.4 44.4  Platelets 150 - 400 K/uL 264 300 342    . CMP Latest Ref Rng & Units 08/16/2021 08/15/2021 08/09/2021  Glucose 70 - 99 mg/dL 88 129(H) 95  BUN 8 - 23 mg/dL 16 22 26(H)  Creatinine 0.61 - 1.24 mg/dL 0.90 1.17 1.17  Sodium 135 - 145 mmol/L 137 136 138  Potassium 3.5 - 5.1 mmol/L 3.8 4.2 3.9  Chloride 98 - 111 mmol/L 102 98 102  CO2 22 - 32 mmol/L _0 Calcium 8.9 - 10.3 mg/dL 8.4(L) 8.8(L) 9.5  Total Protein 6.5 - 8.1 g/dL 6.9 7.4 7.6  Total Bilirubin 0.3 - 1.2 mg/dL 0.8 0.6 0.5  Alkaline Phos 38 - 126 U/L 57 65 80  AST 15 - 41 U/L _1 ALT 0 - 44 U/L 47(H) 46(H) 43   SURGICAL PATHOLOGY  CASE: [id number]  PATIENT: Dennies Bertoli  Surgical Pathology Report   Clinical History: neck mass (cm)      FINAL MICROSCOPIC DIAGNOSIS:   A. SOFT TISSUE MASS, LEFT NECK, BIOPSY:  - Non-Hodgkin B-cell lymphoma, see comment.   COMMENT:   The majority of the fragments have extensive crush artifact hampering  evaluation. One fragment is well preserved and demonstrates an atypical  lymphoid proliferation with predominately small irregular lymphocytes.  Immunohistochemistry is positive for CD20, bcl-2, bcl-6 and CD10. CD23  is largely negative. CyclinD1 and light chain in situ hybridization are  negative. Ki-67 is 20-25%. CD3 and CD5 highlight scattered T-cells. Flow  cytometry ([id number]) reveals predominately T-cells. Overall, the  findings are consistent with a non-Hodgkin B-cell lymphoma, specifically  follicular lymphoma. There is only limited preserved tissue hampering  grading, but this area appears low grade (grade 1-2 of 3). A higher  grade process in the  crushed fragments cannot be excluded. Clinical  correlation is recommended. Dr. Redmond Baseman was attempted on 07/18/2021.   RADIOGRAPHIC STUDIES: I have personally reviewed the radiological images as listed and agreed with the findings in the report. CT Angio Head W or Wo Contrast  Result Date: 08/15/2021 CLINICAL DATA:  Syncope and hypoxia, large neck mass due to non-Hodgkin's lymphoma EXAM: CT ANGIOGRAPHY HEAD AND NECK TECHNIQUE: Multidetector CT imaging of the head and neck was performed using the standard protocol during bolus administration of intravenous contrast. Multiplanar CT image reconstructions and MIPs were obtained to evaluate the vascular anatomy. Carotid stenosis measurements (when applicable) are obtained utilizing NASCET criteria, using the distal internal carotid diameter as the denominator. CONTRAST:  124m OMNIPAQUE IOHEXOL 350 MG/ML SOLN COMPARISON:  CT neck 07/09/2021 FINDINGS: CT HEAD FINDINGS Brain: There is no acute intracranial hemorrhage, extra-axial fluid collection, or acute infarct. The ventricles are normal in size. There is no mass lesion. There is no midline shift. Vascular: No hyperdense vessel or unexpected calcification. The vasculature is assessed info below Skull: Normal. Negative for fracture or focal lesion. Sinuses: There is moderate mucosal thickening with layering fluid in the right maxillary sinus. There is surrounding hyperostosis consistent with chronic maxillary sinusitis. Orbits: The globes and orbits are unremarkable. Review of the MIP images confirms the above findings CTA NECK FINDINGS Aortic arch: The aortic arch is unremarkable. Right carotid system: The right common, internal, and external carotid arteries are patent with no significant stenosis, dissection, or aneurysm. Left carotid system: The proximal left common carotid artery is unremarkable. There is complete encasement of the common carotid artery from the  level of the clavicular head through the  bifurcation with complete encasement of the internal and external carotid arteries. The internal carotid artery is encased from the bifurcation through the petrous segment. The distal branches of the external carotid artery are not encased. There is no significant narrowing of the left carotid system. There is no evidence of dissection or aneurysm. There is minimal calcified atherosclerotic plaque in the carotid bulb. Vertebral arteries: The vertebral arteries are patent, with no significant stenosis, dissection, or aneurysm. Skeleton: There is multilevel degenerative change of the cervical spine. There is no acute osseous abnormality or aggressive osseous lesion. Other neck: There is a large infiltrative soft tissue mass throughout the soft tissues of the left neck. The mass measures up to approximately 8.9 cm AP by 9.3 cm TV by 13.8 cm cc, increased in size since 07/09/2021 when it measured approximately 6.6 cm TV by 6.0 cm AP by 10.0 cm cc, measured again using similar technique. Inferiorly, the mass extends into the upper mediastinum just posterior to the clavicle. The mass is inseparable from the sternocleidomastoid. As above, there is complete encasement of the carotid vasculature without narrowing of the arteries. The airway is shifted to the right and moderately narrowed at the level of the oropharynx. Upper chest: The lungs are assessed on the separately dictated CT chest. Review of the MIP images confirms the above findings CTA HEAD FINDINGS Anterior circulation: The bilateral intracranial ICAs are patent. The bilateral MCAs are patent The bilateral ACAs are patent. There is no aneurysm. Posterior circulation: The bilateral V4 segments are patent. The basilar artery is patent. The bilateral PCAs are patent. The posterior communicating arteries are not definitely identified. Venous sinuses: There is a hypodense filling defect in the right sigmoid sinus. Anatomic variants: As above. Review of the MIP images  confirms the above findings IMPRESSION: Since 07/09/2021: 1. Significant interval increase in size of the large mass in the left neck which extends from the skull base superiorly into the upper mediastinum inferiorly. The mass completely encases most of the left common carotid artery, the entire left internal carotid artery from the bifurcation to the skull base, and much of the left external carotid artery and branches. Despite the encasement, there is no significant narrowing of the left carotid system. The mass results in significant mass effect on surrounding structures and moderate narrowing of the oropharyngeal airway. 2. Otherwise, unremarkable vasculature of the head and neck. 3. Hypodense filling defect in the right sigmoid sinus concerning for nonocclusive thrombus. 4. Chronic right maxillary sinusitis. Electronically Signed   By: Valetta Mole M.D.   On: 08/15/2021 19:24   DG Chest 2 View  Result Date: 08/15/2021 CLINICAL DATA:  Hypoxia.  Lymphoma. EXAM: CHEST - 2 VIEW COMPARISON:  07/31/2021 FINDINGS: Right arm PICC tip in the SVC 4 cm above the right atrium. Heart size is normal. The lungs are clear. No effusions. Left neck mass demonstrated with displacement of the trachea towards the right. IMPRESSION: No active cardiopulmonary disease. PICC tip in the SVC 4 cm above the right atrium. Known left neck mass. Electronically Signed   By: Nelson Chimes M.D.   On: 08/15/2021 14:43   DG Chest 2 View  Result Date: 07/28/2021 CLINICAL DATA:  Seizure-like activity. EXAM: CHEST - 2 VIEW COMPARISON:  June 08, 2019 FINDINGS: The heart size and mediastinal contours are within normal limits. Both lungs are clear. The visualized skeletal structures are unremarkable. IMPRESSION: No active cardiopulmonary disease. Electronically Signed   By: Thomas Hoff  Dimitrova M.D.   On: 07/28/2021 14:51   CT Head Wo Contrast  Result Date: 07/28/2021 CLINICAL DATA:  Seizure activity, frontal scalp abrasion EXAM: CT HEAD  WITHOUT CONTRAST TECHNIQUE: Contiguous axial images were obtained from the base of the skull through the vertex without intravenous contrast. COMPARISON:  None. FINDINGS: Brain: No acute infarct or hemorrhage. Lateral ventricles and midline structures are unremarkable. No acute extra-axial fluid collections. No mass effect. Vascular: No hyperdense vessel or unexpected calcification. Skull: Normal. Negative for fracture or focal lesion. Sinuses/Orbits: Mild mucosal thickening within the maxillary sinuses. Remaining paranasal sinuses are clear. Other: None. IMPRESSION: 1. No acute intracranial process. Electronically Signed   By: Randa Ngo M.D.   On: 07/28/2021 18:39   CT Angio Neck W and/or Wo Contrast  Result Date: 08/15/2021 CLINICAL DATA:  Syncope and hypoxia, large neck mass due to non-Hodgkin's lymphoma EXAM: CT ANGIOGRAPHY HEAD AND NECK TECHNIQUE: Multidetector CT imaging of the head and neck was performed using the standard protocol during bolus administration of intravenous contrast. Multiplanar CT image reconstructions and MIPs were obtained to evaluate the vascular anatomy. Carotid stenosis measurements (when applicable) are obtained utilizing NASCET criteria, using the distal internal carotid diameter as the denominator. CONTRAST:  161m OMNIPAQUE IOHEXOL 350 MG/ML SOLN COMPARISON:  CT neck 07/09/2021 FINDINGS: CT HEAD FINDINGS Brain: There is no acute intracranial hemorrhage, extra-axial fluid collection, or acute infarct. The ventricles are normal in size. There is no mass lesion. There is no midline shift. Vascular: No hyperdense vessel or unexpected calcification. The vasculature is assessed info below Skull: Normal. Negative for fracture or focal lesion. Sinuses: There is moderate mucosal thickening with layering fluid in the right maxillary sinus. There is surrounding hyperostosis consistent with chronic maxillary sinusitis. Orbits: The globes and orbits are unremarkable. Review of the MIP  images confirms the above findings CTA NECK FINDINGS Aortic arch: The aortic arch is unremarkable. Right carotid system: The right common, internal, and external carotid arteries are patent with no significant stenosis, dissection, or aneurysm. Left carotid system: The proximal left common carotid artery is unremarkable. There is complete encasement of the common carotid artery from the level of the clavicular head through the bifurcation with complete encasement of the internal and external carotid arteries. The internal carotid artery is encased from the bifurcation through the petrous segment. The distal branches of the external carotid artery are not encased. There is no significant narrowing of the left carotid system. There is no evidence of dissection or aneurysm. There is minimal calcified atherosclerotic plaque in the carotid bulb. Vertebral arteries: The vertebral arteries are patent, with no significant stenosis, dissection, or aneurysm. Skeleton: There is multilevel degenerative change of the cervical spine. There is no acute osseous abnormality or aggressive osseous lesion. Other neck: There is a large infiltrative soft tissue mass throughout the soft tissues of the left neck. The mass measures up to approximately 8.9 cm AP by 9.3 cm TV by 13.8 cm cc, increased in size since 07/09/2021 when it measured approximately 6.6 cm TV by 6.0 cm AP by 10.0 cm cc, measured again using similar technique. Inferiorly, the mass extends into the upper mediastinum just posterior to the clavicle. The mass is inseparable from the sternocleidomastoid. As above, there is complete encasement of the carotid vasculature without narrowing of the arteries. The airway is shifted to the right and moderately narrowed at the level of the oropharynx. Upper chest: The lungs are assessed on the separately dictated CT chest. Review of the MIP images  confirms the above findings CTA HEAD FINDINGS Anterior circulation: The bilateral  intracranial ICAs are patent. The bilateral MCAs are patent The bilateral ACAs are patent. There is no aneurysm. Posterior circulation: The bilateral V4 segments are patent. The basilar artery is patent. The bilateral PCAs are patent. The posterior communicating arteries are not definitely identified. Venous sinuses: There is a hypodense filling defect in the right sigmoid sinus. Anatomic variants: As above. Review of the MIP images confirms the above findings IMPRESSION: Since 07/09/2021: 1. Significant interval increase in size of the large mass in the left neck which extends from the skull base superiorly into the upper mediastinum inferiorly. The mass completely encases most of the left common carotid artery, the entire left internal carotid artery from the bifurcation to the skull base, and much of the left external carotid artery and branches. Despite the encasement, there is no significant narrowing of the left carotid system. The mass results in significant mass effect on surrounding structures and moderate narrowing of the oropharyngeal airway. 2. Otherwise, unremarkable vasculature of the head and neck. 3. Hypodense filling defect in the right sigmoid sinus concerning for nonocclusive thrombus. 4. Chronic right maxillary sinusitis. Electronically Signed   By: Valetta Mole M.D.   On: 08/15/2021 19:24   CT Angio Chest PE W and/or Wo Contrast  Result Date: 08/15/2021 CLINICAL DATA:  Concern for pulmonary embolism. EXAM: CT ANGIOGRAPHY CHEST WITH CONTRAST TECHNIQUE: Multidetector CT imaging of the chest was performed using the standard protocol during bolus administration of intravenous contrast. Multiplanar CT image reconstructions and MIPs were obtained to evaluate the vascular anatomy. CONTRAST:  113m OMNIPAQUE IOHEXOL 350 MG/ML SOLN COMPARISON:  Chest CT dated 07/31/2021. FINDINGS: Cardiovascular: There is no cardiomegaly or pericardial effusion. The thoracic aorta is unremarkable. Evaluation of the  pulmonary arteries is limited due to respiratory motion artifact and suboptimal visualization of the peripheral branches. No large or central pulmonary artery embolus identified. Right sided PICC now extends superiorly into the right IJ. The PICC appears to bend in the right IJ and turns downward. The tip of the PICC appears to be in the right IJ at the level of the subclavian vein (33/6 and coronal 91/7). Evaluation however is limited due to presence of intravenous contrast. Mediastinum/Nodes: No hilar or mediastinal adenopathy. The esophagus is grossly unremarkable. Partially visualized mass in the left side of the neck measures 9.1 x 8.4 cm and increase in size since the study of 07/09/2021 (previously measuring approximately 7.1 x 6.0 cm). There is associated mass effect and deviation of the trachea to the right. No mediastinal fluid collection. Lungs/Pleura: No focal consolidation, pleural effusion, or pneumothorax. The central airways are patent. Upper Abdomen: No acute abnormality. Musculoskeletal: Degenerative changes of the spine. No acute osseous pathology. Review of the MIP images confirms the above findings. IMPRESSION: 1. No CT evidence of central pulmonary artery embolus. 2. Right sided PICC now extends superiorly into the right IJ. The tip of the PICC appears to be in the right IJ at the level of the subclavian vein. Evaluation however is limited due to presence of intravenous contrast. 3. Partially visualized mass in the left side of the neck has increased in size since the study of 07/09/2021 and is concerning for malignancy. These results were called by telephone at the time of interpretation on 08/15/2021 at 7:39 pm to Dr. HGilford Raid who verbally acknowledged these results. Electronically Signed   By: AAnner CreteM.D.   On: 08/15/2021 19:49   CT Angio Chest  PE W and/or Wo Contrast  Addendum Date: 07/31/2021   ADDENDUM REPORT: 07/31/2021 19:50 ADDENDUM: These results were called by  telephone at the time of interpretation on 07/31/2021 at 7:48 pm to provider JOSEPH ZAMMIT , who verbally acknowledged these results. Electronically Signed   By: Iven Finn M.D.   On: 07/31/2021 19:50   Result Date: 07/31/2021 CLINICAL DATA:  PE suspected, low/intermediate prob, neg D-dimer EXAM: CT ANGIOGRAPHY CHEST WITH CONTRAST TECHNIQUE: Multidetector CT imaging of the chest was performed using the standard protocol during bolus administration of intravenous contrast. Multiplanar CT image reconstructions and MIPs were obtained to evaluate the vascular anatomy. CONTRAST:  182m OMNIPAQUE IOHEXOL 350 MG/ML SOLN COMPARISON:  None. FINDINGS: Cardiovascular: Satisfactory opacification of the pulmonary arteries to the segmental level. Segmental and subsegmental right lower lobe pulmonary embolus. Segmental and subsegmental right middle lobe pulmonary embolus. Limited evaluation of left lower lobe with question subsegmental pulmonary emboli. Normal heart size. No pericardial effusion. Mediastinum/Nodes: No enlarged mediastinal, hilar, or axillary lymph nodes. Thyroid gland, trachea, and esophagus demonstrate no significant findings. Lungs/Pleura: Limited evaluation due to motion artifact. Expiratory phase of respiration. Mosaic attenuation of the lungs. No focal consolidation. No pulmonary nodule. No pulmonary mass. No pleural effusion. No pneumothorax. Upper Abdomen: Possible mild hepatic steatosis. Musculoskeletal: No chest wall abnormality. No suspicious lytic or blastic osseous lesions. No acute displaced fracture. Multilevel degenerative changes of the spine. Review of the MIP images confirms the above findings. IMPRESSION: Segmental and subsegmental right lower and middle lobe pulmonary emboli. Question left lower lobe subsegmental pulmonary emboli. No findings of right heart strain or pulmonary infarction. Electronically Signed: By: MIven FinnM.D. 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 EXAM: NUCLEAR MEDICINE PET SKULL BASE TO THIGH TECHNIQUE: 16.9 mCi F-18 FDG was injected intravenously. Full-ring PET imaging was performed from the skull base to thigh after the radiotracer. CT data was obtained and used for attenuation correction and anatomic localization. Fasting blood glucose: 121 mg/dl COMPARISON:  None FINDINGS: Mediastinal blood pool activity: SUV max 3.78 Liver activity: SUV max 4.33 NECK: There is a large nodal mass within the left cervical chain extending from the level 2 region through level 4 and 5. This measures the 9.6 x 8.5 by 13.9 cm and has an SUV max 26.47, image 43/4. The tumor extends into the superior mediastinum along with the left paratracheal region, image 56/4. There is also tumor extension into the prevertebral soft tissue space at the level of the oropharynx with involvement of the right tonsillar fossa. Tumor also extends into the prevertebral soft tissues at the level of the hypopharynx with mass effect upon the hypopharynx with deviation towards the right. Incidental CT findings: none CHEST: Mild FDG uptake associated with 9 mm subcarinal lymph node within SUV max of 3.68, image 83/4. No FDG avid pulmonary nodule. Incidental CT findings: Mild aortic atherosclerosis and coronary artery calcifications. No pleural effusion. ABDOMEN/PELVIS: No abnormal FDG uptake identified within the liver. Within the neck of pancreas there is a approximately 1.4 cm focus of increased uptake with SUV max of 7.88. Within the body of the pancreas there is a focus of increased uptake measuring 2 cm with SUV max of 14.0. Normal size spleen without focal areas of increased uptake. Adrenal glands appear normal. No FDG avid lymph nodes within the abdomen or pelvis. FDG avid right inguinal node measures 7  mm within SUV max of 6.96, image 199/4. Incidental CT findings:  Large fat containing umbilical hernia measures 8.0 x 6.3 cm. Small bilateral fat containing inguinal hernias. SKELETON: There are a few scattered FDG avid bone lesions, without corresponding CT changes. For example, within the left humeral head there is a moderate to intense focus of increased uptake within SUV max of 9.87. Within the transverse process the T10 vertebra on the left there is a focus of increased uptake within SUV max of 8.85. Within the inferior endplate of the L2 vertebral body there is a small focus of increased uptake with SUV max of 8.27. Incidental CT findings: none IMPRESSION: 1. Examination is positive for large FDG avid mass within the left neck extending from the level 2 nodal chain into the left side of the superior mediastinum compatible with metabolically active tumor. 2. There are 2 foci of increased uptake within the neck and body of pancreas lymphomatous involvement of the pancreas. 3. Subcentimeter FDG avid right inguinal lymph node also concerning for lymphoma. 4. Several foci of increased uptake within the axial skeleton compatible with malignancy. Electronically Signed   By: Kerby Moors M.D.   On: 08/14/2021 06:22   US Venous Img Lower Bilateral (DVT)  Result Date: 08/01/2021 CLINICAL DATA:  Pulmonary emboli on CTA chest 07/31/2021. Lower extremity edema. EXAM: Bilateral LOWER EXTREMITY VENOUS DOPPLER ULTRASOUND TECHNIQUE: Gray-scale sonography with compression, as well as color and duplex ultrasound, were performed to evaluate the deep venous system(s) from the level of the common femoral vein through the popliteal and proximal calf veins. COMPARISON:  None. FINDINGS: VENOUS Left side: Substantial thrombus in the superficial femoral vein and profundus femoral vein on the left, with no significant compressibility, partially occlusive but not completely occlusive. This extends into the posterior tibial veins and peroneal veins as well as the popliteal vein. The common  femoral vein appears patent. Right side: Normal appearance of the common femoral vein, superficial femoral vein, popliteal vein, and calf veins on the right. Expected compressibility and augmentation. OTHER None. Limitations: none IMPRESSION: 1. Positive for left-sided DVT with left superficial femoral vein, profundus femoral vein, popliteal vein, and calf vein thrombosis on the left side, in this patient with recent diagnosis of acute pulmonary embolus. No findings of DVT on the right side. Electronically Signed   By: Van Clines M.D.   On: 08/01/2021 15:50   DG Chest Port 1 View  Result Date: 07/31/2021 CLINICAL DATA:  Wheezing, history of COVID positive. EXAM: PORTABLE CHEST 1 VIEW COMPARISON:  July 28, 2021 FINDINGS: The heart size and mediastinal contours are within normal limits. Both lungs are clear. The visualized skeletal structures are unremarkable. IMPRESSION: No active disease. Electronically Signed   By: Virgina Norfolk M.D.   On: 07/31/2021 16:22   ECHOCARDIOGRAM COMPLETE  Result Date: 08/01/2021    ECHOCARDIOGRAM REPORT   Patient Name:   MARITZA GOLDSBOROUGH Date of Exam: 08/01/2021 Medical Rec #:  [id number]       Height:       69.0 in Accession #:    [id number]      Weight:       323.4 lb Date of Birth:  November 04, 1955       BSA:          2.534 m Patient Age:    34 years        BP:           149/82 mmHg Patient Gender: M  HR:           95 bpm. Exam Location:  [facility] Procedure: 2D Echo, Cardiac Doppler and Color Doppler Indications:    Pulmonary Embolus I26.09  History:        Patient has no prior history of Echocardiogram examinations.                 Risk Factors:Hypertension and Dyslipidemia. Obesity. OSA                 (obstructive sleep apnea). Covid-19 infection. Cancer ([place])                 (From Hx).  Sonographer:    Alvino Chapel RCS Referring Phys: [id number] [place]  1. Left ventricular ejection fraction, by estimation, is 60 to 65%. The  left ventricle has normal function. The left ventricle has no regional wall motion abnormalities. Left ventricular diastolic parameters were normal.  2. Right ventricular systolic function is normal. The right ventricular size is normal. Tricuspid regurgitation signal is inadequate for assessing PA pressure.  3. The mitral valve is grossly normal. No evidence of mitral valve regurgitation. No evidence of mitral stenosis.  4. The aortic valve is tricuspid. Aortic valve regurgitation is not visualized. No aortic stenosis is present.  5. The inferior vena cava is normal in size with greater than 50% respiratory variability, suggesting right atrial pressure of 3 mmHg. Conclusion(s)/Recommendation(s): Normal biventricular function without evidence of hemodynamically significant valvular heart disease. FINDINGS  Left Ventricle: Left ventricular ejection fraction, by estimation, is 60 to 65%. The left ventricle has normal function. The left ventricle has no regional wall motion abnormalities. The left ventricular internal cavity size was normal in size. There is  no left ventricular hypertrophy. Left ventricular diastolic parameters were normal. Right Ventricle: The right ventricular size is normal. No increase in right ventricular wall thickness. Right ventricular systolic function is normal. Tricuspid regurgitation signal is inadequate for assessing PA pressure. Left Atrium: Left atrial size was normal in size. Right Atrium: Right atrial size was normal in size. Pericardium: Trivial pericardial effusion is present. Presence of pericardial fat pad. Mitral Valve: The mitral valve is grossly normal. No evidence of mitral valve regurgitation. No evidence of mitral valve stenosis. Tricuspid Valve: The tricuspid valve is grossly normal. Tricuspid valve regurgitation is trivial. No evidence of tricuspid stenosis. Aortic Valve: The aortic valve is tricuspid. Aortic valve regurgitation is not visualized. No aortic stenosis is  present. Aortic valve mean gradient measures 8.0 mmHg. Aortic valve peak gradient measures 16.5 mmHg. Aortic valve area, by VTI measures 2.63  cm. Pulmonic Valve: The pulmonic valve was grossly normal. Pulmonic valve regurgitation is not visualized. No evidence of pulmonic stenosis. Aorta: The aortic root is normal in size and structure. Venous: The right lower pulmonary vein is normal. The inferior vena cava is normal in size with greater than 50% respiratory variability, suggesting right atrial pressure of 3 mmHg. IAS/Shunts: The atrial septum is grossly normal.  LEFT VENTRICLE PLAX 2D LVIDd:         4.50 cm   Diastology LVIDs:         2.70 cm   LV e' medial:    11.10 cm/s LV PW:         1.10 cm   LV E/e' medial:  7.6 LV IVS:        1.10 cm   LV e' lateral:   12.30 cm/s LVOT diam:     2.00 cm  LV E/e' lateral: 6.8 LV SV:         97 LV SV Index:   38 LVOT Area:     3.14 cm  RIGHT VENTRICLE RV S prime:     21.10 cm/s TAPSE (M-mode): 3.2 cm LEFT ATRIUM             Index        RIGHT ATRIUM           Index LA diam:        3.60 cm 1.42 cm/m   RA Area:     11.70 cm LA Vol (A2C):   33.8 ml 13.34 ml/m  RA Volume:   26.20 ml  10.34 ml/m LA Vol (A4C):   48.7 ml 19.22 ml/m LA Biplane Vol: 42.7 ml 16.85 ml/m  AORTIC VALVE AV Area (Vmax):    2.62 cm AV Area (Vmean):   2.82 cm AV Area (VTI):     2.63 cm AV Vmax:           203.00 cm/s AV Vmean:          135.000 cm/s AV VTI:            0.371 m AV Peak Grad:      16.5 mmHg AV Mean Grad:      8.0 mmHg LVOT Vmax:         169.00 cm/s LVOT Vmean:        121.000 cm/s LVOT VTI:          0.310 m LVOT/AV VTI ratio: 0.84  AORTA Ao Root diam: 3.40 cm MITRAL VALVE MV Area (PHT): 2.83 cm     SHUNTS MV Decel Time: 268 msec     Systemic VTI:  0.31 m MV E velocity: 83.90 cm/s   Systemic Diam: 2.00 cm MV A velocity: 112.00 cm/s MV E/A ratio:  0.75 Eleonore Chiquito MD Electronically signed by Eleonore Chiquito MD Signature Date/Time: 08/01/2021/4:20:57 PM    Final    IR PICC PLACEMENT  RIGHT >5 YRS INC IMG GUIDE  Result Date: 08/14/2021 INDICATION: Recent diagnosis of lymphoma. Request made for placement of a PICC line for the initiation of chemotherapy Note, initial plan was to proceed with port a catheter placement however the patient has recently been diagnosed with DVT and pulmonary embolism and referring oncologist has requested a PICC line initially as the patient is begun on anticoagulation. EXAM: ULTRASOUND AND FLUOROSCOPIC GUIDED PICC LINE INSERTION MEDICATIONS: None. CONTRAST:  None FLUOROSCOPY TIME:  18 seconds (2 mGy) COMPLICATIONS: None immediate. TECHNIQUE: The procedure, risks, benefits, and alternatives were explained to the patient's niece and informed written consent was obtained. A timeout was performed prior to the initiation of the procedure. The right upper extremity was prepped with chlorhexidine in a sterile fashion, and a sterile drape was applied covering the operative field. Maximum barrier sterile technique with sterile gowns and gloves were used for the procedure. A timeout was performed prior to the initiation of the procedure. Local anesthesia was provided with 1% lidocaine. Under direct ultrasound guidance, the brachial vein was accessed with a micropuncture kit after the overlying soft tissues were anesthetized with 1% lidocaine. Real-time ultrasound guidance was utilized for vascular access including the acquisition of a permanent ultrasound image documenting patency of the accessed vessel. A guidewire was advanced to the level of the superior caval-atrial junction for measurement purposes and the PICC line was cut to length. A peel-away sheath was placed and a 40 cm, 5 [place], dual lumen  was inserted to level of the superior caval-atrial junction. A post procedure spot fluoroscopic was obtained. The catheter easily aspirated and flushed and was secured in place with stat lock device. A dressing was applied. The patient tolerated the procedure well without  immediate post procedural complication. FINDINGS: After catheter placement, the tip lies within the superior cavoatrial junction. The catheter aspirates and flushes normally and is ready for immediate use. IMPRESSION: Successful ultrasound and fluoroscopic guided placement of a right brachial vein approach, 40 cm, 5 French, dual lumen PICC with tip at the superior caval-atrial junction. The PICC line is ready for immediate use. Electronically Signed   By: Sandi Mariscal M.D.   On: 08/14/2021 17:27    ASSESSMENT & PLAN:   65 year old male with   1) newly diagnosed bulky left cervical follicular lymphoma. Based on limited sampling the patient's pathology reveals a grade 1 through 2 follicular lymphoma with a Ki-67 of 25%.  There was a lot of crush artifact in the sample and therefore a high-grade process could not be ruled out. However the predominant element of his mass has grown significantly over the last 1 month which is concerning for a high-grade follicular lymphoma or follicular lymphoma with transformation to large B-cell lymphoma.  CT neck imaging shows concern with compression of left internal jugular vein with mild mass-effect on the airway in the neck and the left carotid is inseparable from the mass.  High risk of syncopal episodes if there is compression of the common carotid baroreceptors.  2) HTN  3) Obstructive sleep apnea not on CPAP machine.  This is causing sleep issues and possible cognitive issues.  4) Depression and anxiety  5) acute pulmonary embolism improved lymphoma plus body habitus plus possible vascular compression PLAN -Discussed outside imaging results showing PE.  Reviewed labs today -Continue Eliquis at this time for his PE.  Discussed that we would have a low threshold to switch him to Lovenox if there are any other new concerns given his heavier body weight. -Port-A-Cath placement canceled and switched to PICC line placement since we would not want to interrupt  his anticoagulation for his newly diagnosed PE. -We discussed that if he had any other syncopal events we would have to admit him to the hospital and given his fourth cycle of R-CHOP as inpatient. -He is following with his primary doctor to address his sleep apnea and to set up an outpatient sleep study since this could be adding to his confusion as well. -Would avoid sedatives as much as possible. Followup Please reschedule cycle 1 of R-CHOP from today10/20/2022 to 08/13/2021 with day 3 Udenyca. Labs and MD visit cycle 1 day 12 of R-CHOP for toxicity check. PET CT scan is scheduled on 10/21  . Orders Placed This Encounter  Procedures   IR PICC PLACEMENT RIGHT >5 YRS INC IMG GUIDE    This order would replace the Port-A-Cath placement scheduled on 10/25    Standing Status:   Future    Number of Occurrences:   1    Standing Expiration Date:   08/09/2022    Order Specific Question:   Reason for Exam (SYMPTOM  OR DIAGNOSIS REQUIRED)    Answer:   PICC line placement instead of port since patient had a recent PE and he is on therapeutic anticoagulation that cannot be held.  Starting chemotherapy for high-grade follicular lymphoma.    Order Specific Question:   Preferred Imaging Location?    Answer:   [facility]  All of the patients questions were answered with apparent satisfaction. The patient knows to call the clinic with any problems, questions or concerns.  . The total time spent in the appointment was 30 minutes and more than 50% was on counseling and direct patient cares.     Sullivan Lone MD MS AAHIVMS [facility] Hematology/Oncology Physician [facility]   .

## 2021-08-15 NOTE — Assessment & Plan Note (Signed)
Pt not being treated with CPAP.

## 2021-08-15 NOTE — ED Notes (Signed)
EKG was done earlier in pt's stay, but was exported incorrectly. Repeat captured and exported to chart.

## 2021-08-15 NOTE — Assessment & Plan Note (Signed)
Chronic. 

## 2021-08-15 NOTE — Assessment & Plan Note (Signed)
Continue norvasc 

## 2021-08-15 NOTE — ED Triage Notes (Signed)
Pt was dx with non hodgkins lymphoma and has a large mass in his neck. Pt passed out today an starts his first chemo tomorrow. Pt states that these 'episodes' are due to the mass pushing on things in his neck. Alert and oriented. Pt denies trauma and states he was sitting down when it happened.

## 2021-08-15 NOTE — H&P (Signed)
History and Physical    Joe [name] KDT:[id number] DOB: 28-Apr-1956 DOA: 08/15/2021  PCP: Celene Squibb, MD   Patient coming from: Home  I have personally briefly reviewed patient's old medical records in [place]  CC: recurrent syncope HPI: 65 year old male with a history of hypertension, obesity, OSA who presents to the ER today with episodes of recurrent syncope.  Patient with a history of a recently diagnosed follicular lymphoma.  He was scheduled for chemotherapy starting tomorrow.  His right upper extremity PICC line.  He been recently discharged about 2 weeks ago after having a pulmonary embolism.  He has been on Eliquis since then.  Patient states that today while he was sitting down, he had a sharp stabbing pain up the left side of his neck.  This which the top of his head.  He states that he passed out from the pain.  Of note, patient has a large lymphoma in the left side of the neck.  CT angio of the neck today demonstrates that significant interval increase in the mass.  He also may have a right sigmoid sinus nonocclusive thrombus.  There is also deviation of his trachea to the right-hand side.  Work-up in the ER has been without cause for his syncope.  Patient has been taking prednisone for the last 2 weeks.  He he states that he does not know his current prednisone dose.  Due to his recurrent syncope, his outpatient hematologist sent him to the ER for evaluation and admission.  [facility] hospitalist contacted for admission.   ED Course: CTA chest negative for PE. CTA neck shows interval increase in size of pt's known lymphoma. No carotid stenosis.  Review of Systems:  Review of Systems  Constitutional:  Negative for chills and fever.  HENT:  Positive for ear pain. Negative for hearing loss.   Eyes: Negative.   Respiratory: Negative.  Negative for cough, hemoptysis and sputum production.   Cardiovascular: Negative.  Negative for chest pain, palpitations and orthopnea.   Gastrointestinal: Negative.  Negative for abdominal pain, diarrhea, heartburn and vomiting.  Genitourinary: Negative.  Negative for dysuria, frequency and urgency.  Musculoskeletal:  Positive for neck pain. Negative for back pain and joint pain.  Skin: Negative.   Neurological:        Sharp, stabbing pain on the left posterior neck. Running upwards to the vertex of his head.  Endo/Heme/Allergies: Negative.  Negative for environmental allergies. Does not bruise/bleed easily.  Psychiatric/Behavioral:  Negative for depression and suicidal ideas.   All other systems reviewed and are negative.  Past Medical History:  Diagnosis Date   Anxiety    Arthritis    Cancer ([place])    COVID    x 2 (2020 and 2022)   Depression    Hypertension     Past Surgical History:  Procedure Laterality Date   carpel tunnel     MASS BIOPSY Left 07/14/2021   Procedure: NECK MASS BIOPSY;  Surgeon: Melida Quitter, MD;  Location: [facility];  Service: ENT;  Laterality: Left;     reports that he has quit smoking. His smoking use included cigarettes. He has quit using smokeless tobacco.  His smokeless tobacco use included chew. He reports current alcohol use. He reports that he does not use drugs.  No Known Allergies  Family History  Problem Relation Age of Onset   Healthy Mother    Healthy Father     Prior to Admission medications   Medication Sig Start Date  End Date Taking? Authorizing Provider  allopurinol (ZYLOPRIM) 300 MG tablet Take 300 mg by mouth daily.    [provider]  amLODipine (NORVASC) 10 MG tablet Take 10 mg by mouth daily. 04/26/20   [provider]  APIXABAN Arne Cleveland) VTE STARTER PACK (10MG AND 5MG) Take as directed on package: start with two-73m tablets twice daily for 7 days. On day 8, switch to one-584mtablet twice daily. 08/02/21   EzAlma FriendlyMD  chlorhexidine (PERIDEX) 0.12 % solution Use as directed 15 mLs in the mouth or throat 2 (two) times daily for 14 days.  Swish and spit 08/09/21 08/23/21  KaBrunetta GeneraMD  ergocalciferol (VITAMIN D2) 1.25 MG (50000 UT) capsule Take 1 capsule (50,000 Units total) by mouth once a week. 08/09/21   KaBrunetta GeneraMD  Multiple Vitamin (MULTIVITAMIN WITH MINERALS) TABS tablet Take 1 tablet by mouth daily. 08/03/21 09/02/21  EzAlma FriendlyMD  ondansetron (ZOFRAN) 8 MG tablet Take 1 tablet (8 mg total) by mouth 2 (two) times daily as needed for refractory nausea / vomiting. Start on day 3 after cyclophosphamide chemotherapy. 08/14/21   KaBrunetta GeneraMD  predniSONE (DELTASONE) 20 MG tablet Take 3 tablets (60 mg total) by mouth daily with breakfast for 5 days, THEN 2 tablets (40 mg total) daily with breakfast for 7 days, THEN 1 tablet (20 mg total) daily with breakfast for 10 days. Patient not taking: Reported on 07/31/2021 07/31/21 08/22/21  KaBrunetta GeneraMD  predniSONE (DELTASONE) 20 MG tablet Take 3 tablets (60 mg total) by mouth daily. Take with food on days 2-6 of chemotherapy. 08/14/21   KaBrunetta GeneraMD  prochlorperazine (COMPAZINE) 10 MG tablet Take 1 tablet (10 mg total) by mouth every 6 (six) hours as needed (Nausea or vomiting). 08/14/21   KaBrunetta GeneraMD  sertraline (ZOLOFT) 50 MG tablet Take 50 mg by mouth daily.    [provider]  simvastatin (ZOCOR) 40 MG tablet Take 40 mg by mouth 3 (three) times a week. Patient not taking: No sig reported 06/26/21   [provider]    Physical Exam: Vitals:   08/15/21 1331 08/15/21 1332 08/15/21 1611  BP: 128/73  (!) 143/85  Pulse: 74  92  Resp: 18  20  Temp: 97.6 F (36.4 C)    TempSrc: Oral    SpO2: 97%  97%  Weight:  (!) 144.7 kg   Height:  '5\' 9"'  (1.753 m)     Physical Exam Vitals and nursing note reviewed.  Constitutional:      General: He is not in acute distress.    Appearance: He is not ill-appearing, toxic-appearing or diaphoretic.  HENT:     Head: Normocephalic and atraumatic.      Nose: No rhinorrhea.  Eyes:     General:        Right eye: No discharge.        Left eye: No discharge.  Neck:     Comments: Large soft tissue mass on left side of neck running from angle of mandible to posterior cervical spinous process. Runs inferiorly to supraclavicular fossa.  Mass is hard, non-mobile. Cardiovascular:     Rate and Rhythm: Normal rate and regular rhythm.     Pulses: Normal pulses.  Pulmonary:     Effort: Pulmonary effort is normal. No respiratory distress.     Breath sounds: No wheezing or rales.     Comments: Tracheal deviation to the right side  Abdominal:     General: Bowel sounds are normal. There is no distension.     Palpations: Abdomen is soft.     Tenderness: There is no abdominal tenderness. There is no guarding or rebound.  Musculoskeletal:     Right lower leg: No edema.     Left lower leg: No edema.  Skin:    General: Skin is warm and dry.     Capillary Refill: Capillary refill takes less than 2 seconds.  Neurological:     General: No focal deficit present.     Mental Status: He is oriented to person, place, and time.     Labs on Admission: I have personally reviewed following labs and imaging studies  CBC: Recent Labs  Lab 08/09/21 0914 08/15/21 1353  WBC 13.6* 14.3*  NEUTROABS 8.6* 11.7*  HGB 15.4 14.9  HCT 44.4 44.4  MCV 87.2 92.7  PLT 342 341   Basic Metabolic Panel: Recent Labs  Lab 08/09/21 0914 08/15/21 1353  NA 138 136  K 3.9 4.2  CL 102 98  CO2 25 31  GLUCOSE 95 129*  BUN 26* 22  CREATININE 1.17 1.17  CALCIUM 9.5 8.8*   GFR: Estimated Creatinine Clearance: 91.7 mL/min (by C-G formula based on SCr of 1.17 mg/dL). Liver Function Tests: Recent Labs  Lab 08/09/21 0914 08/15/21 1353  AST 20 27  ALT 43 46*  ALKPHOS 80 65  BILITOT 0.5 0.6  PROT 7.6 7.4  ALBUMIN 3.8 3.7   No results for input(s): LIPASE, AMYLASE in the last 168 hours. No results for input(s): AMMONIA in the last 168 hours. Coagulation Profile: No  results for input(s): INR, PROTIME in the last 168 hours. Cardiac Enzymes: No results for input(s): CKTOTAL, CKMB, CKMBINDEX, TROPONINI in the last 168 hours. BNP (last 3 results) No results for input(s): PROBNP in the last 8760 hours. HbA1C: No results for input(s): HGBA1C in the last 72 hours. CBG: Recent Labs  Lab 08/10/21 1511  GLUCAP 121*   Lipid Profile: No results for input(s): CHOL, HDL, LDLCALC, TRIG, CHOLHDL, LDLDIRECT in the last 72 hours. Thyroid Function Tests: No results for input(s): TSH, T4TOTAL, FREET4, T3FREE, THYROIDAB in the last 72 hours. Anemia Panel: No results for input(s): VITAMINB12, FOLATE, FERRITIN, TIBC, IRON, RETICCTPCT in the last 72 hours. Urine analysis:    Component Value Date/Time   COLORURINE YELLOW 09/16/2016 0520   APPEARANCEUR CLEAR 09/16/2016 0520   LABSPEC >1.030 (H) 09/16/2016 0520   PHURINE 5.0 09/16/2016 0520   GLUCOSEU NEGATIVE 09/16/2016 0520   HGBUR NEGATIVE 09/16/2016 0520   BILIRUBINUR NEGATIVE 09/16/2016 0520   KETONESUR TRACE (A) 09/16/2016 0520   PROTEINUR 30 (A) 09/16/2016 0520   NITRITE NEGATIVE 09/16/2016 0520   LEUKOCYTESUR NEGATIVE 09/16/2016 0520    Radiological Exams on Admission: I have personally reviewed images CT Angio Head W or Wo Contrast  Result Date: 08/15/2021 CLINICAL DATA:  Syncope and hypoxia, large neck mass due to non-Hodgkin's lymphoma EXAM: CT ANGIOGRAPHY HEAD AND NECK TECHNIQUE: Multidetector CT imaging of the head and neck was performed using the standard protocol during bolus administration of intravenous contrast. Multiplanar CT image reconstructions and MIPs were obtained to evaluate the vascular anatomy. Carotid stenosis measurements (when applicable) are obtained utilizing NASCET criteria, using the distal internal carotid diameter as the denominator. CONTRAST:  133m OMNIPAQUE IOHEXOL 350 MG/ML SOLN COMPARISON:  CT neck 07/09/2021 FINDINGS: CT HEAD FINDINGS Brain: There is no acute intracranial  hemorrhage, extra-axial fluid collection, or acute infarct. The ventricles are  normal in size. There is no mass lesion. There is no midline shift. Vascular: No hyperdense vessel or unexpected calcification. The vasculature is assessed info below Skull: Normal. Negative for fracture or focal lesion. Sinuses: There is moderate mucosal thickening with layering fluid in the right maxillary sinus. There is surrounding hyperostosis consistent with chronic maxillary sinusitis. Orbits: The globes and orbits are unremarkable. Review of the MIP images confirms the above findings CTA NECK FINDINGS Aortic arch: The aortic arch is unremarkable. Right carotid system: The right common, internal, and external carotid arteries are patent with no significant stenosis, dissection, or aneurysm. Left carotid system: The proximal left common carotid artery is unremarkable. There is complete encasement of the common carotid artery from the level of the clavicular head through the bifurcation with complete encasement of the internal and external carotid arteries. The internal carotid artery is encased from the bifurcation through the petrous segment. The distal branches of the external carotid artery are not encased. There is no significant narrowing of the left carotid system. There is no evidence of dissection or aneurysm. There is minimal calcified atherosclerotic plaque in the carotid bulb. Vertebral arteries: The vertebral arteries are patent, with no significant stenosis, dissection, or aneurysm. Skeleton: There is multilevel degenerative change of the cervical spine. There is no acute osseous abnormality or aggressive osseous lesion. Other neck: There is a large infiltrative soft tissue mass throughout the soft tissues of the left neck. The mass measures up to approximately 8.9 cm AP by 9.3 cm TV by 13.8 cm cc, increased in size since 07/09/2021 when it measured approximately 6.6 cm TV by 6.0 cm AP by 10.0 cm cc, measured again  using similar technique. Inferiorly, the mass extends into the upper mediastinum just posterior to the clavicle. The mass is inseparable from the sternocleidomastoid. As above, there is complete encasement of the carotid vasculature without narrowing of the arteries. The airway is shifted to the right and moderately narrowed at the level of the oropharynx. Upper chest: The lungs are assessed on the separately dictated CT chest. Review of the MIP images confirms the above findings CTA HEAD FINDINGS Anterior circulation: The bilateral intracranial ICAs are patent. The bilateral MCAs are patent The bilateral ACAs are patent. There is no aneurysm. Posterior circulation: The bilateral V4 segments are patent. The basilar artery is patent. The bilateral PCAs are patent. The posterior communicating arteries are not definitely identified. Venous sinuses: There is a hypodense filling defect in the right sigmoid sinus. Anatomic variants: As above. Review of the MIP images confirms the above findings IMPRESSION: Since 07/09/2021: 1. Significant interval increase in size of the large mass in the left neck which extends from the skull base superiorly into the upper mediastinum inferiorly. The mass completely encases most of the left common carotid artery, the entire left internal carotid artery from the bifurcation to the skull base, and much of the left external carotid artery and branches. Despite the encasement, there is no significant narrowing of the left carotid system. The mass results in significant mass effect on surrounding structures and moderate narrowing of the oropharyngeal airway. 2. Otherwise, unremarkable vasculature of the head and neck. 3. Hypodense filling defect in the right sigmoid sinus concerning for nonocclusive thrombus. 4. Chronic right maxillary sinusitis. Electronically Signed   By: Valetta Mole M.D.   On: 08/15/2021 19:24   DG Chest 2 View  Result Date: 08/15/2021 CLINICAL DATA:  Hypoxia.   Lymphoma. EXAM: CHEST - 2 VIEW COMPARISON:  07/31/2021 FINDINGS: Right  arm PICC tip in the SVC 4 cm above the right atrium. Heart size is normal. The lungs are clear. No effusions. Left neck mass demonstrated with displacement of the trachea towards the right. IMPRESSION: No active cardiopulmonary disease. PICC tip in the SVC 4 cm above the right atrium. Known left neck mass. Electronically Signed   By: Nelson Chimes M.D.   On: 08/15/2021 14:43   CT Angio Neck W and/or Wo Contrast  Result Date: 08/15/2021 CLINICAL DATA:  Syncope and hypoxia, large neck mass due to non-Hodgkin's lymphoma EXAM: CT ANGIOGRAPHY HEAD AND NECK TECHNIQUE: Multidetector CT imaging of the head and neck was performed using the standard protocol during bolus administration of intravenous contrast. Multiplanar CT image reconstructions and MIPs were obtained to evaluate the vascular anatomy. Carotid stenosis measurements (when applicable) are obtained utilizing NASCET criteria, using the distal internal carotid diameter as the denominator. CONTRAST:  136m OMNIPAQUE IOHEXOL 350 MG/ML SOLN COMPARISON:  CT neck 07/09/2021 FINDINGS: CT HEAD FINDINGS Brain: There is no acute intracranial hemorrhage, extra-axial fluid collection, or acute infarct. The ventricles are normal in size. There is no mass lesion. There is no midline shift. Vascular: No hyperdense vessel or unexpected calcification. The vasculature is assessed info below Skull: Normal. Negative for fracture or focal lesion. Sinuses: There is moderate mucosal thickening with layering fluid in the right maxillary sinus. There is surrounding hyperostosis consistent with chronic maxillary sinusitis. Orbits: The globes and orbits are unremarkable. Review of the MIP images confirms the above findings CTA NECK FINDINGS Aortic arch: The aortic arch is unremarkable. Right carotid system: The right common, internal, and external carotid arteries are patent with no significant stenosis, dissection,  or aneurysm. Left carotid system: The proximal left common carotid artery is unremarkable. There is complete encasement of the common carotid artery from the level of the clavicular head through the bifurcation with complete encasement of the internal and external carotid arteries. The internal carotid artery is encased from the bifurcation through the petrous segment. The distal branches of the external carotid artery are not encased. There is no significant narrowing of the left carotid system. There is no evidence of dissection or aneurysm. There is minimal calcified atherosclerotic plaque in the carotid bulb. Vertebral arteries: The vertebral arteries are patent, with no significant stenosis, dissection, or aneurysm. Skeleton: There is multilevel degenerative change of the cervical spine. There is no acute osseous abnormality or aggressive osseous lesion. Other neck: There is a large infiltrative soft tissue mass throughout the soft tissues of the left neck. The mass measures up to approximately 8.9 cm AP by 9.3 cm TV by 13.8 cm cc, increased in size since 07/09/2021 when it measured approximately 6.6 cm TV by 6.0 cm AP by 10.0 cm cc, measured again using similar technique. Inferiorly, the mass extends into the upper mediastinum just posterior to the clavicle. The mass is inseparable from the sternocleidomastoid. As above, there is complete encasement of the carotid vasculature without narrowing of the arteries. The airway is shifted to the right and moderately narrowed at the level of the oropharynx. Upper chest: The lungs are assessed on the separately dictated CT chest. Review of the MIP images confirms the above findings CTA HEAD FINDINGS Anterior circulation: The bilateral intracranial ICAs are patent. The bilateral MCAs are patent The bilateral ACAs are patent. There is no aneurysm. Posterior circulation: The bilateral V4 segments are patent. The basilar artery is patent. The bilateral PCAs are patent. The  posterior communicating arteries are not definitely identified.  Venous sinuses: There is a hypodense filling defect in the right sigmoid sinus. Anatomic variants: As above. Review of the MIP images confirms the above findings IMPRESSION: Since 07/09/2021: 1. Significant interval increase in size of the large mass in the left neck which extends from the skull base superiorly into the upper mediastinum inferiorly. The mass completely encases most of the left common carotid artery, the entire left internal carotid artery from the bifurcation to the skull base, and much of the left external carotid artery and branches. Despite the encasement, there is no significant narrowing of the left carotid system. The mass results in significant mass effect on surrounding structures and moderate narrowing of the oropharyngeal airway. 2. Otherwise, unremarkable vasculature of the head and neck. 3. Hypodense filling defect in the right sigmoid sinus concerning for nonocclusive thrombus. 4. Chronic right maxillary sinusitis. Electronically Signed   By: Valetta Mole M.D.   On: 08/15/2021 19:24   CT Angio Chest PE W and/or Wo Contrast  Result Date: 08/15/2021 CLINICAL DATA:  Concern for pulmonary embolism. EXAM: CT ANGIOGRAPHY CHEST WITH CONTRAST TECHNIQUE: Multidetector CT imaging of the chest was performed using the standard protocol during bolus administration of intravenous contrast. Multiplanar CT image reconstructions and MIPs were obtained to evaluate the vascular anatomy. CONTRAST:  120m OMNIPAQUE IOHEXOL 350 MG/ML SOLN COMPARISON:  Chest CT dated 07/31/2021. FINDINGS: Cardiovascular: There is no cardiomegaly or pericardial effusion. The thoracic aorta is unremarkable. Evaluation of the pulmonary arteries is limited due to respiratory motion artifact and suboptimal visualization of the peripheral branches. No large or central pulmonary artery embolus identified. Right sided PICC now extends superiorly into the right IJ.  The PICC appears to bend in the right IJ and turns downward. The tip of the PICC appears to be in the right IJ at the level of the subclavian vein (33/6 and coronal 91/7). Evaluation however is limited due to presence of intravenous contrast. Mediastinum/Nodes: No hilar or mediastinal adenopathy. The esophagus is grossly unremarkable. Partially visualized mass in the left side of the neck measures 9.1 x 8.4 cm and increase in size since the study of 07/09/2021 (previously measuring approximately 7.1 x 6.0 cm). There is associated mass effect and deviation of the trachea to the right. No mediastinal fluid collection. Lungs/Pleura: No focal consolidation, pleural effusion, or pneumothorax. The central airways are patent. Upper Abdomen: No acute abnormality. Musculoskeletal: Degenerative changes of the spine. No acute osseous pathology. Review of the MIP images confirms the above findings. IMPRESSION: 1. No CT evidence of central pulmonary artery embolus. 2. Right sided PICC now extends superiorly into the right IJ. The tip of the PICC appears to be in the right IJ at the level of the subclavian vein. Evaluation however is limited due to presence of intravenous contrast. 3. Partially visualized mass in the left side of the neck has increased in size since the study of 07/09/2021 and is concerning for malignancy. These results were called by telephone at the time of interpretation on 08/15/2021 at 7:39 pm to Dr. HGilford Raid who verbally acknowledged these results. Electronically Signed   By: AAnner CreteM.D.   On: 08/15/2021 19:49   IR PICC PLACEMENT RIGHT >5 YRS INC IMG GUIDE  Result Date: 08/14/2021 INDICATION: Recent diagnosis of lymphoma. Request made for placement of a PICC line for the initiation of chemotherapy Note, initial plan was to proceed with port a catheter placement however the patient has recently been diagnosed with DVT and pulmonary embolism and referring oncologist has requested  a PICC line  initially as the patient is begun on anticoagulation. EXAM: ULTRASOUND AND FLUOROSCOPIC GUIDED PICC LINE INSERTION MEDICATIONS: None. CONTRAST:  None FLUOROSCOPY TIME:  18 seconds (2 mGy) COMPLICATIONS: None immediate. TECHNIQUE: The procedure, risks, benefits, and alternatives were explained to the patient's niece and informed written consent was obtained. A timeout was performed prior to the initiation of the procedure. The right upper extremity was prepped with chlorhexidine in a sterile fashion, and a sterile drape was applied covering the operative field. Maximum barrier sterile technique with sterile gowns and gloves were used for the procedure. A timeout was performed prior to the initiation of the procedure. Local anesthesia was provided with 1% lidocaine. Under direct ultrasound guidance, the brachial vein was accessed with a micropuncture kit after the overlying soft tissues were anesthetized with 1% lidocaine. Real-time ultrasound guidance was utilized for vascular access including the acquisition of a permanent ultrasound image documenting patency of the accessed vessel. A guidewire was advanced to the level of the superior caval-atrial junction for measurement purposes and the PICC line was cut to length. A peel-away sheath was placed and a 40 cm, 5 [place], dual lumen was inserted to level of the superior caval-atrial junction. A post procedure spot fluoroscopic was obtained. The catheter easily aspirated and flushed and was secured in place with stat lock device. A dressing was applied. The patient tolerated the procedure well without immediate post procedural complication. FINDINGS: After catheter placement, the tip lies within the superior cavoatrial junction. The catheter aspirates and flushes normally and is ready for immediate use. IMPRESSION: Successful ultrasound and fluoroscopic guided placement of a right brachial vein approach, 40 cm, 5 French, dual lumen PICC with tip at the superior  caval-atrial junction. The PICC line is ready for immediate use. Electronically Signed   By: Sandi Mariscal M.D.   On: 08/14/2021 17:27    EKG: I have personally reviewed EKG: EKG not yet performed.  Assessment/Plan Principal Problem:   Recurrent syncope Active Problems:   Follicular lymphoma ([facility])   Pulmonary embolism ([facility])   Essential hypertension   OSA (obstructive sleep apnea)   Obesity, Class III, BMI 40-49.9 (morbid obesity) ([place])    Recurrent syncope Admit to observation telemetry bed. Still awaiting EKG in ER. CTA neck negative for carotid stenosis.  Pt still could have compression on carotid due to encasement of vasculature from lymphoma. Another possibility is compression on the vagus nerve. Will continue with IV hydration. He will need it regardless due to starting chemo for lymphoma.  Follicular lymphoma ([place]) Pt was scheduled to start chemo tomorrow by heme/onc. Pt has RUE PICC line. Continue with allopurinol. Pt unsure of his current prednisone dose.  Pulmonary embolism ([place]) Heme/onc wants pt on lovenox injections.  Essential hypertension Continue norvasc.  OSA (obstructive sleep apnea) Pt not being treated with CPAP.  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  DVT prophylaxis: Lovenox Code Status: Full Code Family Communication: no family at bedside  Disposition Plan: return home  Consults called: none  Admission status: Observation, Telemetry bed   Kristopher Oppenheim, DO [facility] 08/15/2021, 8:09 PM

## 2021-08-15 NOTE — Assessment & Plan Note (Addendum)
Pt was scheduled to start chemo tomorrow by heme/onc. Pt has RUE PICC line. Continue with allopurinol. Pt unsure of his current prednisone dose.

## 2021-08-15 NOTE — ED Provider Notes (Signed)
[facility] DEPT Provider Note   CSN: [id number] Arrival date & time: 08/15/21  1316     History Chief Complaint  Patient presents with   Loss of Consciousness    Joe [name] is a 65 y.o. male.  HPI  Patient is a 65 year old male who presents to the emergency department with his niece due to syncope as well as hypoxia.  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  For the past month he has been having intermittent syncopal episodes.  His niece at bedside states that this occurs about twice per week.  She states that he will experience pain along the back of the head just behind the left ear which will radiate upwards into the head.  After this occurs he states that his mouth feel dry and he will lose consciousness.  His niece says sometimes as well occur for many minutes.  Patient currently denies any headaches, chest pain, shortness of breath, visual changes.  Denies any chest pain, shortness of breath, visual changes when these episodes occur.  Patient also recently diagnosed with PEs and started on Eliquis.  Denies any missed doses of Eliquis.     Past Medical History:  Diagnosis Date   Anxiety    Arthritis    Cancer ([place])    COVID    x 2 (2020 and 2022)   Depression    Hypertension     Patient Active Problem List   Diagnosis Date Noted   Recurrent syncope 08/15/2021   Obesity, Class III, BMI 40-49.9 (morbid obesity) ([place]) 08/15/2021   Counseling regarding advance care planning and goals of care 04/59/9774   Follicular lymphoma ([place]) 08/01/2021   Essential hypertension 08/01/2021   Hyperlipidemia 08/01/2021   OSA (obstructive sleep apnea) 08/01/2021   Depression 08/01/2021   Pulmonary embolism ([place]) 07/31/2021    Past Surgical History:  Procedure Laterality Date   carpel tunnel     MASS BIOPSY Left 07/14/2021   Procedure: NECK MASS BIOPSY;  Surgeon:  Melida Quitter, MD;  Location: [facility] OR;  Service: ENT;  Laterality: Left;       Family History  Problem Relation Age of Onset   Healthy Mother    Healthy Father     Social History   Tobacco Use   Smoking status: Former    Types: Cigarettes   Smokeless tobacco: Former    Types: Chew  Substance Use Topics   Alcohol use: Yes    Comment: rare   Drug use: No    Home Medications Prior to Admission medications   Medication Sig Start Date End Date Taking? Authorizing Provider  allopurinol (ZYLOPRIM) 300 MG tablet Take 300 mg by mouth daily.    [provider]  amLODipine (NORVASC) 10 MG tablet Take 10 mg by mouth daily. 04/26/20   [provider]  APIXABAN Arne Cleveland) VTE STARTER PACK (10MG AND 5MG) Take as directed on package: start with two-58m tablets twice daily for 7 days. On day 8, switch to one-543mtablet twice daily. 08/02/21   EzAlma FriendlyMD  chlorhexidine (PERIDEX) 0.12 % solution Use as directed 15 mLs in the mouth or throat 2 (two) times daily for 14 days. Swish and spit 08/09/21 08/23/21  KaBrunetta GeneraMD  ergocalciferol (VITAMIN D2) 1.25 MG (50000 UT) capsule Take 1 capsule (50,000 Units total) by mouth once a week. 08/09/21   KaBrunetta GeneraMD  Multiple Vitamin (MULTIVITAMIN WITH MINERALS) TABS tablet Take 1 tablet by mouth daily. 08/03/21 09/02/21  Alma Friendly, MD  ondansetron (ZOFRAN) 8 MG tablet Take 1 tablet (8 mg total) by mouth 2 (two) times daily as needed for refractory nausea / vomiting. Start on day 3 after cyclophosphamide chemotherapy. 08/14/21   Brunetta Genera, MD  predniSONE (DELTASONE) 20 MG tablet Take 3 tablets (60 mg total) by mouth daily with breakfast for 5 days, THEN 2 tablets (40 mg total) daily with breakfast for 7 days, THEN 1 tablet (20 mg total) daily with breakfast for 10 days. Patient not taking: Reported on 07/31/2021 07/31/21 08/22/21  Brunetta Genera, MD  predniSONE (DELTASONE) 20 MG tablet  Take 3 tablets (60 mg total) by mouth daily. Take with food on days 2-6 of chemotherapy. 08/14/21   Brunetta Genera, MD  prochlorperazine (COMPAZINE) 10 MG tablet Take 1 tablet (10 mg total) by mouth every 6 (six) hours as needed (Nausea or vomiting). 08/14/21   Brunetta Genera, MD  sertraline (ZOLOFT) 50 MG tablet Take 50 mg by mouth daily.    [provider]  simvastatin (ZOCOR) 40 MG tablet Take 40 mg by mouth 3 (three) times a week. Patient not taking: No sig reported 06/26/21   [provider]    Allergies    Patient has no known allergies.  Review of Systems   Review of Systems  All other systems reviewed and are negative. Ten systems reviewed and are negative for acute change, except as noted in the HPI.   Physical Exam Updated Vital Signs BP (!) 143/85 (BP Location: Left Arm)   Pulse 92   Temp 97.6 F (36.4 C) (Oral)   Resp 20   Ht 5' 9" (1.753 m)   Wt (!) 144.7 kg   SpO2 97%   BMI 47.11 kg/m   Physical Exam Vitals and nursing note reviewed.  Constitutional:      General: He is not in acute distress.    Appearance: Normal appearance. He is not ill-appearing, toxic-appearing or diaphoretic.  HENT:     Head: Normocephalic and atraumatic.     Right Ear: External ear normal.     Left Ear: External ear normal.     Nose: Nose normal.     Mouth/Throat:     Mouth: Mucous membranes are moist.     Pharynx: Oropharynx is clear. No oropharyngeal exudate or posterior oropharyngeal erythema.  Eyes:     General: No scleral icterus.       Right eye: No discharge.        Left eye: No discharge.     Extraocular Movements: Extraocular movements intact.     Conjunctiva/sclera: Conjunctivae normal.  Neck:     Comments: Large mass to the left neck. Cardiovascular:     Rate and Rhythm: Normal rate and regular rhythm.     Pulses: Normal pulses.     Heart sounds: Normal heart sounds. No murmur heard.   No friction rub. No gallop.  Pulmonary:     Effort:  Pulmonary effort is normal. No respiratory distress.     Breath sounds: Normal breath sounds. No stridor. No wheezing, rhonchi or rales.  Abdominal:     General: Abdomen is flat.     Palpations: Abdomen is soft.     Tenderness: There is no abdominal tenderness.  Musculoskeletal:        General: Normal range of motion.     Cervical back: Normal range of  motion and neck supple.  Skin:    General: Skin is warm and dry.  Neurological:     General: No focal deficit present.     Mental Status: He is alert and oriented to person, place, and time.  Psychiatric:        Mood and Affect: Mood normal.        Behavior: Behavior normal.   ED Results / Procedures / Treatments   Labs (all labs ordered are listed, but only abnormal results are displayed) Labs Reviewed  CBC WITH DIFFERENTIAL/PLATELET - Abnormal; Notable for the following components:      Result Value   WBC 14.3 (*)    Neutro Abs 11.7 (*)    All other components within normal limits  COMPREHENSIVE METABOLIC PANEL - Abnormal; Notable for the following components:   Glucose, Bld 129 (*)    Calcium 8.8 (*)    ALT 46 (*)    All other components within normal limits  RESP PANEL BY RT-PCR (FLU A&B, COVID) ARPGX2  BRAIN NATRIURETIC PEPTIDE   EKG None  Radiology CT Angio Head W or Wo Contrast  Result Date: 08/15/2021 CLINICAL DATA:  Syncope and hypoxia, large neck mass due to non-Hodgkin's lymphoma EXAM: CT ANGIOGRAPHY HEAD AND NECK TECHNIQUE: Multidetector CT imaging of the head and neck was performed using the standard protocol during bolus administration of intravenous contrast. Multiplanar CT image reconstructions and MIPs were obtained to evaluate the vascular anatomy. Carotid stenosis measurements (when applicable) are obtained utilizing NASCET criteria, using the distal internal carotid diameter as the denominator. CONTRAST:  180m OMNIPAQUE IOHEXOL 350 MG/ML SOLN COMPARISON:  CT neck 07/09/2021 FINDINGS: CT HEAD FINDINGS  Brain: There is no acute intracranial hemorrhage, extra-axial fluid collection, or acute infarct. The ventricles are normal in size. There is no mass lesion. There is no midline shift. Vascular: No hyperdense vessel or unexpected calcification. The vasculature is assessed info below Skull: Normal. Negative for fracture or focal lesion. Sinuses: There is moderate mucosal thickening with layering fluid in the right maxillary sinus. There is surrounding hyperostosis consistent with chronic maxillary sinusitis. Orbits: The globes and orbits are unremarkable. Review of the MIP images confirms the above findings CTA NECK FINDINGS Aortic arch: The aortic arch is unremarkable. Right carotid system: The right common, internal, and external carotid arteries are patent with no significant stenosis, dissection, or aneurysm. Left carotid system: The proximal left common carotid artery is unremarkable. There is complete encasement of the common carotid artery from the level of the clavicular head through the bifurcation with complete encasement of the internal and external carotid arteries. The internal carotid artery is encased from the bifurcation through the petrous segment. The distal branches of the external carotid artery are not encased. There is no significant narrowing of the left carotid system. There is no evidence of dissection or aneurysm. There is minimal calcified atherosclerotic plaque in the carotid bulb. Vertebral arteries: The vertebral arteries are patent, with no significant stenosis, dissection, or aneurysm. Skeleton: There is multilevel degenerative change of the cervical spine. There is no acute osseous abnormality or aggressive osseous lesion. Other neck: There is a large infiltrative soft tissue mass throughout the soft tissues of the left neck. The mass measures up to approximately 8.9 cm AP by 9.3 cm TV by 13.8 cm cc, increased in size since 07/09/2021 when it measured approximately 6.6 cm TV by 6.0 cm  AP by 10.0 cm cc, measured again using similar technique. Inferiorly, the mass extends into the upper  mediastinum just posterior to the clavicle. The mass is inseparable from the sternocleidomastoid. As above, there is complete encasement of the carotid vasculature without narrowing of the arteries. The airway is shifted to the right and moderately narrowed at the level of the oropharynx. Upper chest: The lungs are assessed on the separately dictated CT chest. Review of the MIP images confirms the above findings CTA HEAD FINDINGS Anterior circulation: The bilateral intracranial ICAs are patent. The bilateral MCAs are patent The bilateral ACAs are patent. There is no aneurysm. Posterior circulation: The bilateral V4 segments are patent. The basilar artery is patent. The bilateral PCAs are patent. The posterior communicating arteries are not definitely identified. Venous sinuses: There is a hypodense filling defect in the right sigmoid sinus. Anatomic variants: As above. Review of the MIP images confirms the above findings IMPRESSION: Since 07/09/2021: 1. Significant interval increase in size of the large mass in the left neck which extends from the skull base superiorly into the upper mediastinum inferiorly. The mass completely encases most of the left common carotid artery, the entire left internal carotid artery from the bifurcation to the skull base, and much of the left external carotid artery and branches. Despite the encasement, there is no significant narrowing of the left carotid system. The mass results in significant mass effect on surrounding structures and moderate narrowing of the oropharyngeal airway. 2. Otherwise, unremarkable vasculature of the head and neck. 3. Hypodense filling defect in the right sigmoid sinus concerning for nonocclusive thrombus. 4. Chronic right maxillary sinusitis. Electronically Signed   By: Valetta Mole M.D.   On: 08/15/2021 19:24   DG Chest 2 View  Result Date:  08/15/2021 CLINICAL DATA:  Hypoxia.  Lymphoma. EXAM: CHEST - 2 VIEW COMPARISON:  07/31/2021 FINDINGS: Right arm PICC tip in the SVC 4 cm above the right atrium. Heart size is normal. The lungs are clear. No effusions. Left neck mass demonstrated with displacement of the trachea towards the right. IMPRESSION: No active cardiopulmonary disease. PICC tip in the SVC 4 cm above the right atrium. Known left neck mass. Electronically Signed   By: Nelson Chimes M.D.   On: 08/15/2021 14:43   CT Angio Neck W and/or Wo Contrast  Result Date: 08/15/2021 CLINICAL DATA:  Syncope and hypoxia, large neck mass due to non-Hodgkin's lymphoma EXAM: CT ANGIOGRAPHY HEAD AND NECK TECHNIQUE: Multidetector CT imaging of the head and neck was performed using the standard protocol during bolus administration of intravenous contrast. Multiplanar CT image reconstructions and MIPs were obtained to evaluate the vascular anatomy. Carotid stenosis measurements (when applicable) are obtained utilizing NASCET criteria, using the distal internal carotid diameter as the denominator. CONTRAST:  124m OMNIPAQUE IOHEXOL 350 MG/ML SOLN COMPARISON:  CT neck 07/09/2021 FINDINGS: CT HEAD FINDINGS Brain: There is no acute intracranial hemorrhage, extra-axial fluid collection, or acute infarct. The ventricles are normal in size. There is no mass lesion. There is no midline shift. Vascular: No hyperdense vessel or unexpected calcification. The vasculature is assessed info below Skull: Normal. Negative for fracture or focal lesion. Sinuses: There is moderate mucosal thickening with layering fluid in the right maxillary sinus. There is surrounding hyperostosis consistent with chronic maxillary sinusitis. Orbits: The globes and orbits are unremarkable. Review of the MIP images confirms the above findings CTA NECK FINDINGS Aortic arch: The aortic arch is unremarkable. Right carotid system: The right common, internal, and external carotid arteries are patent  with no significant stenosis, dissection, or aneurysm. Left carotid system: The proximal left common  carotid artery is unremarkable. There is complete encasement of the common carotid artery from the level of the clavicular head through the bifurcation with complete encasement of the internal and external carotid arteries. The internal carotid artery is encased from the bifurcation through the petrous segment. The distal branches of the external carotid artery are not encased. There is no significant narrowing of the left carotid system. There is no evidence of dissection or aneurysm. There is minimal calcified atherosclerotic plaque in the carotid bulb. Vertebral arteries: The vertebral arteries are patent, with no significant stenosis, dissection, or aneurysm. Skeleton: There is multilevel degenerative change of the cervical spine. There is no acute osseous abnormality or aggressive osseous lesion. Other neck: There is a large infiltrative soft tissue mass throughout the soft tissues of the left neck. The mass measures up to approximately 8.9 cm AP by 9.3 cm TV by 13.8 cm cc, increased in size since 07/09/2021 when it measured approximately 6.6 cm TV by 6.0 cm AP by 10.0 cm cc, measured again using similar technique. Inferiorly, the mass extends into the upper mediastinum just posterior to the clavicle. The mass is inseparable from the sternocleidomastoid. As above, there is complete encasement of the carotid vasculature without narrowing of the arteries. The airway is shifted to the right and moderately narrowed at the level of the oropharynx. Upper chest: The lungs are assessed on the separately dictated CT chest. Review of the MIP images confirms the above findings CTA HEAD FINDINGS Anterior circulation: The bilateral intracranial ICAs are patent. The bilateral MCAs are patent The bilateral ACAs are patent. There is no aneurysm. Posterior circulation: The bilateral V4 segments are patent. The basilar artery is  patent. The bilateral PCAs are patent. The posterior communicating arteries are not definitely identified. Venous sinuses: There is a hypodense filling defect in the right sigmoid sinus. Anatomic variants: As above. Review of the MIP images confirms the above findings IMPRESSION: Since 07/09/2021: 1. Significant interval increase in size of the large mass in the left neck which extends from the skull base superiorly into the upper mediastinum inferiorly. The mass completely encases most of the left common carotid artery, the entire left internal carotid artery from the bifurcation to the skull base, and much of the left external carotid artery and branches. Despite the encasement, there is no significant narrowing of the left carotid system. The mass results in significant mass effect on surrounding structures and moderate narrowing of the oropharyngeal airway. 2. Otherwise, unremarkable vasculature of the head and neck. 3. Hypodense filling defect in the right sigmoid sinus concerning for nonocclusive thrombus. 4. Chronic right maxillary sinusitis. Electronically Signed   By: Valetta Mole M.D.   On: 08/15/2021 19:24   CT Angio Chest PE W and/or Wo Contrast  Result Date: 08/15/2021 CLINICAL DATA:  Concern for pulmonary embolism. EXAM: CT ANGIOGRAPHY CHEST WITH CONTRAST TECHNIQUE: Multidetector CT imaging of the chest was performed using the standard protocol during bolus administration of intravenous contrast. Multiplanar CT image reconstructions and MIPs were obtained to evaluate the vascular anatomy. CONTRAST:  139m OMNIPAQUE IOHEXOL 350 MG/ML SOLN COMPARISON:  Chest CT dated 07/31/2021. FINDINGS: Cardiovascular: There is no cardiomegaly or pericardial effusion. The thoracic aorta is unremarkable. Evaluation of the pulmonary arteries is limited due to respiratory motion artifact and suboptimal visualization of the peripheral branches. No large or central pulmonary artery embolus identified. Right sided PICC  now extends superiorly into the right IJ. The PICC appears to bend in the right IJ and turns downward.  The tip of the PICC appears to be in the right IJ at the level of the subclavian vein (33/6 and coronal 91/7). Evaluation however is limited due to presence of intravenous contrast. Mediastinum/Nodes: No hilar or mediastinal adenopathy. The esophagus is grossly unremarkable. Partially visualized mass in the left side of the neck measures 9.1 x 8.4 cm and increase in size since the study of 07/09/2021 (previously measuring approximately 7.1 x 6.0 cm). There is associated mass effect and deviation of the trachea to the right. No mediastinal fluid collection. Lungs/Pleura: No focal consolidation, pleural effusion, or pneumothorax. The central airways are patent. Upper Abdomen: No acute abnormality. Musculoskeletal: Degenerative changes of the spine. No acute osseous pathology. Review of the MIP images confirms the above findings. IMPRESSION: 1. No CT evidence of central pulmonary artery embolus. 2. Right sided PICC now extends superiorly into the right IJ. The tip of the PICC appears to be in the right IJ at the level of the subclavian vein. Evaluation however is limited due to presence of intravenous contrast. 3. Partially visualized mass in the left side of the neck has increased in size since the study of 07/09/2021 and is concerning for malignancy. These results were called by telephone at the time of interpretation on 08/15/2021 at 7:39 pm to Dr. Gilford Raid, who verbally acknowledged these results. Electronically Signed   By: Anner Crete M.D.   On: 08/15/2021 19:49   IR PICC PLACEMENT RIGHT >5 YRS INC IMG GUIDE  Result Date: 08/14/2021 INDICATION: Recent diagnosis of lymphoma. Request made for placement of a PICC line for the initiation of chemotherapy Note, initial plan was to proceed with port a catheter placement however the patient has recently been diagnosed with DVT and pulmonary embolism and  referring oncologist has requested a PICC line initially as the patient is begun on anticoagulation. EXAM: ULTRASOUND AND FLUOROSCOPIC GUIDED PICC LINE INSERTION MEDICATIONS: None. CONTRAST:  None FLUOROSCOPY TIME:  18 seconds (2 mGy) COMPLICATIONS: None immediate. TECHNIQUE: The procedure, risks, benefits, and alternatives were explained to the patient's niece and informed written consent was obtained. A timeout was performed prior to the initiation of the procedure. The right upper extremity was prepped with chlorhexidine in a sterile fashion, and a sterile drape was applied covering the operative field. Maximum barrier sterile technique with sterile gowns and gloves were used for the procedure. A timeout was performed prior to the initiation of the procedure. Local anesthesia was provided with 1% lidocaine. Under direct ultrasound guidance, the brachial vein was accessed with a micropuncture kit after the overlying soft tissues were anesthetized with 1% lidocaine. Real-time ultrasound guidance was utilized for vascular access including the acquisition of a permanent ultrasound image documenting patency of the accessed vessel. A guidewire was advanced to the level of the superior caval-atrial junction for measurement purposes and the PICC line was cut to length. A peel-away sheath was placed and a 40 cm, 5 [place], dual lumen was inserted to level of the superior caval-atrial junction. A post procedure spot fluoroscopic was obtained. The catheter easily aspirated and flushed and was secured in place with stat lock device. A dressing was applied. The patient tolerated the procedure well without immediate post procedural complication. FINDINGS: After catheter placement, the tip lies within the superior cavoatrial junction. The catheter aspirates and flushes normally and is ready for immediate use. IMPRESSION: Successful ultrasound and fluoroscopic guided placement of a right brachial vein approach, 40 cm, 5 French,  dual lumen PICC with tip at the superior caval-atrial  junction. The PICC line is ready for immediate use. Electronically Signed   By: Sandi Mariscal M.D.   On: 08/14/2021 17:27    Procedures Procedures   Medications Ordered in ED Medications  lactated ringers bolus 1,000 mL (has no administration in time range)  iohexol (OMNIPAQUE) 350 MG/ML injection 150 mL (150 mLs Intravenous Contrast Given 08/15/21 1848)    ED Course  I have reviewed the triage vital signs and the nursing notes.  Pertinent labs & imaging results that were available during my care of the patient were reviewed by me and considered in my medical decision making (see chart for details).    MDM Rules/Calculators/A&P                          Pt is a 65 y.o. male who presents to the emergency department with neck mass, recent diagnosis of lymphoma as well as PE on Eliquis, who presents to the emergency department due to recurrent syncopal episodes.  Labs: CBC with a white count of 14.3 and neutrophils of 11.7. Glucose of 129, calcium of 8.8, ALT of 46. BNP of 25.7. COVID test is pending.  Imaging: CTA of the head, neck, and chest with findings as noted above.  I, Rayna Sexton, PA-C, personally reviewed and evaluated these images and lab results as part of my medical decision-making.  Family reached out to patient's oncologist Dr. Irene Limbo. His note is below:  Hematology Oncology Short Note   I was informed by my nurse that the patient along with family is on the way to [facility] emergency room for another episode of presyncope/syncope. Patient has a large left neck high-grade lymphoma extending into the superior mediastinum. He also has had recent pulmonary embolism and is currently on Eliquis as per outside ED physician. Recommendations -Evaluation for presyncope/syncope as per emergency room physician. -Concerned that the primary tumor is pushing on his carotid artery in the neck and potentially causing  vascular compression at the thoracic outlet. -PET/CT reviewed -Patient was scheduled to start outpatient chemoimmunotherapy with R-CHOP tomorrow but in light of his recurrent presyncopal/syncopal events I would recommend admitting the patient to the hospital for consideration of his first cycle of chemoimmunotherapy as inpatient. -He might need repeat CTA chest to evaluate for PE progression given his body weight being more than 130 kg puts him at increased risk of failure on DOAC's.  Would recommend putting him on Lovenox instead. -I shall be seeing the patient tomorrow morning to define further oncologic plan. -Please expedite admission to hospital medicine.  Unsure the etiology behind patient's syncopal episodes.  Possibly vagal episodes, though no notable episodes of bradycardia since arrival.  CTA of the head and neck shows patent arterial system.  CTA of the chest without central pulmonary artery embolus.  He will require admission for further management as well as to begin chemotherapy tomorrow.  This was discussed with the medicine team and they will accept the patient for admission.  Note: Portions of this report may have been transcribed using voice recognition software. Every effort was made to ensure accuracy; however, inadvertent computerized transcription errors may be present.   Final Clinical Impression(s) / ED Diagnoses Final diagnoses:  Syncope, unspecified syncope type  Neck mass  Lymphoma of lymph nodes of neck, unspecified lymphoma type [facility])   Rx / DC Orders ED Discharge Orders     None        Rayna Sexton, PA-C 08/15/21 2024  Isla Pence, MD 08/15/21 2121

## 2021-08-15 NOTE — Assessment & Plan Note (Addendum)
Heme/onc wants pt on lovenox injections.

## 2021-08-15 NOTE — Assessment & Plan Note (Signed)
Admit to observation telemetry bed. Still awaiting EKG in ER. CTA neck negative for carotid stenosis.  Pt still could have compression on carotid due to encasement of vasculature from lymphoma. Another possibility is compression on the vagus nerve. Will continue with IV hydration. He will need it regardless due to starting chemo for lymphoma.

## 2021-08-16 ENCOUNTER — Ambulatory Visit: Payer: 59

## 2021-08-16 ENCOUNTER — Other Ambulatory Visit: Payer: Self-pay | Admitting: Hematology

## 2021-08-16 DIAGNOSIS — G589 Mononeuropathy, unspecified: Secondary | ICD-10-CM | POA: Diagnosis present

## 2021-08-16 DIAGNOSIS — C8221 Follicular lymphoma grade III, unspecified, lymph nodes of head, face, and neck: Secondary | ICD-10-CM | POA: Diagnosis not present

## 2021-08-16 DIAGNOSIS — D709 Neutropenia, unspecified: Secondary | ICD-10-CM | POA: Diagnosis not present

## 2021-08-16 DIAGNOSIS — I2699 Other pulmonary embolism without acute cor pulmonale: Secondary | ICD-10-CM

## 2021-08-16 DIAGNOSIS — C8251 Diffuse follicle center lymphoma, lymph nodes of head, face, and neck: Secondary | ICD-10-CM | POA: Diagnosis not present

## 2021-08-16 DIAGNOSIS — E662 Morbid (severe) obesity with alveolar hypoventilation: Secondary | ICD-10-CM | POA: Diagnosis present

## 2021-08-16 DIAGNOSIS — C8281 Other types of follicular lymphoma, lymph nodes of head, face, and neck: Principal | ICD-10-CM

## 2021-08-16 DIAGNOSIS — Z79899 Other long term (current) drug therapy: Secondary | ICD-10-CM | POA: Diagnosis not present

## 2021-08-16 DIAGNOSIS — Z20822 Contact with and (suspected) exposure to covid-19: Secondary | ICD-10-CM | POA: Diagnosis present

## 2021-08-16 DIAGNOSIS — R221 Localized swelling, mass and lump, neck: Secondary | ICD-10-CM | POA: Diagnosis not present

## 2021-08-16 DIAGNOSIS — G4733 Obstructive sleep apnea (adult) (pediatric): Secondary | ICD-10-CM | POA: Diagnosis present

## 2021-08-16 DIAGNOSIS — R55 Syncope and collapse: Secondary | ICD-10-CM | POA: Diagnosis present

## 2021-08-16 DIAGNOSIS — C8591 Non-Hodgkin lymphoma, unspecified, lymph nodes of head, face, and neck: Secondary | ICD-10-CM

## 2021-08-16 DIAGNOSIS — F419 Anxiety disorder, unspecified: Secondary | ICD-10-CM | POA: Diagnosis present

## 2021-08-16 DIAGNOSIS — Z7952 Long term (current) use of systemic steroids: Secondary | ICD-10-CM | POA: Diagnosis not present

## 2021-08-16 DIAGNOSIS — Z7901 Long term (current) use of anticoagulants: Secondary | ICD-10-CM | POA: Diagnosis not present

## 2021-08-16 DIAGNOSIS — J9612 Chronic respiratory failure with hypercapnia: Secondary | ICD-10-CM | POA: Diagnosis present

## 2021-08-16 DIAGNOSIS — F32A Depression, unspecified: Secondary | ICD-10-CM | POA: Diagnosis present

## 2021-08-16 DIAGNOSIS — I10 Essential (primary) hypertension: Secondary | ICD-10-CM | POA: Diagnosis present

## 2021-08-16 DIAGNOSIS — Z87891 Personal history of nicotine dependence: Secondary | ICD-10-CM | POA: Diagnosis not present

## 2021-08-16 DIAGNOSIS — Z6841 Body Mass Index (BMI) 40.0 and over, adult: Secondary | ICD-10-CM | POA: Diagnosis not present

## 2021-08-16 DIAGNOSIS — E785 Hyperlipidemia, unspecified: Secondary | ICD-10-CM | POA: Diagnosis present

## 2021-08-16 DIAGNOSIS — Z5111 Encounter for antineoplastic chemotherapy: Secondary | ICD-10-CM | POA: Diagnosis not present

## 2021-08-16 DIAGNOSIS — Z8616 Personal history of COVID-19: Secondary | ICD-10-CM | POA: Diagnosis not present

## 2021-08-16 LAB — CBC WITH DIFFERENTIAL/PLATELET
Abs Immature Granulocytes: 0.04 10*3/uL (ref 0.00–0.07)
Basophils Absolute: 0 10*3/uL (ref 0.0–0.1)
Basophils Relative: 0 %
Eosinophils Absolute: 0.2 10*3/uL (ref 0.0–0.5)
Eosinophils Relative: 1 %
HCT: 41.5 % (ref 39.0–52.0)
Hemoglobin: 13.7 g/dL (ref 13.0–17.0)
Immature Granulocytes: 0 %
Lymphocytes Relative: 18 %
Lymphs Abs: 2.4 10*3/uL (ref 0.7–4.0)
MCH: 30.2 pg (ref 26.0–34.0)
MCHC: 33 g/dL (ref 30.0–36.0)
MCV: 91.6 fL (ref 80.0–100.0)
Monocytes Absolute: 0.9 10*3/uL (ref 0.1–1.0)
Monocytes Relative: 6 %
Neutro Abs: 10.2 10*3/uL — ABNORMAL HIGH (ref 1.7–7.7)
Neutrophils Relative %: 75 %
Platelets: 264 10*3/uL (ref 150–400)
RBC: 4.53 MIL/uL (ref 4.22–5.81)
RDW: 15 % (ref 11.5–15.5)
WBC: 13.7 10*3/uL — ABNORMAL HIGH (ref 4.0–10.5)
nRBC: 0 % (ref 0.0–0.2)

## 2021-08-16 LAB — COMPREHENSIVE METABOLIC PANEL
ALT: 47 U/L — ABNORMAL HIGH (ref 0–44)
AST: 27 U/L (ref 15–41)
Albumin: 3.4 g/dL — ABNORMAL LOW (ref 3.5–5.0)
Alkaline Phosphatase: 57 U/L (ref 38–126)
Anion gap: 7 (ref 5–15)
BUN: 16 mg/dL (ref 8–23)
CO2: 28 mmol/L (ref 22–32)
Calcium: 8.4 mg/dL — ABNORMAL LOW (ref 8.9–10.3)
Chloride: 102 mmol/L (ref 98–111)
Creatinine, Ser: 0.9 mg/dL (ref 0.61–1.24)
GFR, Estimated: >60 mL/min (ref >60)
Glucose, Bld: 88 mg/dL (ref 70–99)
Potassium: 3.8 mmol/L (ref 3.5–5.1)
Sodium: 137 mmol/L (ref 135–145)
Total Bilirubin: 0.8 mg/dL (ref 0.3–1.2)
Total Protein: 6.9 g/dL (ref 6.5–8.1)

## 2021-08-16 LAB — URIC ACID: Uric Acid, Serum: 5.1 mg/dL (ref 3.7–8.6)

## 2021-08-16 LAB — MAGNESIUM: Magnesium: 2 mg/dL (ref 1.7–2.4)

## 2021-08-16 MED ORDER — ACETAMINOPHEN 650 MG RE SUPP: 650.0000 mg | Freq: Four times a day (QID) | RECTAL | Status: DC | PRN

## 2021-08-16 MED ORDER — SERTRALINE HCL 50 MG PO TABS
50.0000 mg | ORAL_TABLET | Freq: Every day | ORAL | Status: DC
  Administered 2021-08-16 – 2021-08-25 (×10): 50 mg via ORAL
  Filled 2021-08-16 (×10): qty 1

## 2021-08-16 MED ORDER — ALLOPURINOL 300 MG PO TABS
300.0000 mg | ORAL_TABLET | Freq: Every day | ORAL | Status: DC
  Administered 2021-08-16 – 2021-08-25 (×10): 300 mg via ORAL
  Filled 2021-08-16 (×10): qty 1

## 2021-08-16 MED ORDER — PROCHLORPERAZINE EDISYLATE 10 MG/2ML IJ SOLN: 10.0000 mg | Freq: Four times a day (QID) | INTRAMUSCULAR | Status: DC | PRN

## 2021-08-16 MED ORDER — ACETAMINOPHEN 325 MG PO TABS
650.0000 mg | ORAL_TABLET | Freq: Four times a day (QID) | ORAL | Status: DC | PRN
  Administered 2021-08-17 (×3): 650 mg via ORAL
  Filled 2021-08-16 (×3): qty 2

## 2021-08-16 MED ORDER — SIMVASTATIN 40 MG PO TABS
40.0000 mg | ORAL_TABLET | ORAL | Status: DC
  Administered 2021-08-17 – 2021-08-24 (×4): 40 mg via ORAL
  Filled 2021-08-16 (×3): qty 1
  Filled 2021-08-16: qty 2

## 2021-08-16 MED ORDER — ENOXAPARIN SODIUM 150 MG/ML IJ SOSY
1.0000 mg/kg | PREFILLED_SYRINGE | Freq: Two times a day (BID) | INTRAMUSCULAR | Status: DC
  Administered 2021-08-16 – 2021-08-20 (×9): 144 mg via SUBCUTANEOUS
  Filled 2021-08-16 (×11): qty 0.96

## 2021-08-16 MED ORDER — LACTATED RINGERS IV SOLN: INTRAVENOUS | Status: AC

## 2021-08-16 MED ORDER — AMLODIPINE BESYLATE 5 MG PO TABS
10.0000 mg | ORAL_TABLET | Freq: Every day | ORAL | Status: DC
  Administered 2021-08-16: 10 mg via ORAL
  Filled 2021-08-16: qty 2

## 2021-08-16 NOTE — Progress Notes (Signed)
PROGRESS NOTE  Joe [name]    DOB: 1955/12/17, 65 y.o.  MEQ:[id number]  PCP: Celene Squibb, MD   Code Status: Full Code   DOA: 08/15/2021   LOS: 0  Brief Narrative of Current Hospitalization  Joe [name] is a 65 y.o. male with a PMH significant for [name], Joe [name], Joe [name], Joe [name], Joe [name], Joe [name]. They presented from home to the ED on 08/15/2021 with [name] occurring frequently. In the ED, it was found that they had no Joe PE, no carotid occlusion but had significant increase in size of neck mass which could be contributing to compression of common carotid baroreceptors.  Heme/onc was consulted. Patient was admitted to medicine service for further workup and management of [name] as outlined in detail below.  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  Joe history of PE -Heme/onc recommended twice daily Lovenox over [name]  [name] -Continue home amlodipine  Joe [name] -recommend CPAP at night, especially considering other factors contributing to oral obstruction  HLD- Continue home simvastatin  Depression-chronic, stable -Continue home sertraline  DVT prophylaxis: SCDs Start: 08/16/21 0400   Diet:  Diet Orders (From admission, onward)     Start     Ordered    08/15/21 2015  DIET - DYS 1 Room service appropriate? Yes; Fluid consistency: Thin  Diet effective now       Question Answer Comment  Room service appropriate? Yes   Fluid consistency: Thin      08/15/21 2014            Subjective 08/16/21    Pt reports denies any sensation of airway obstruction or dysphagia.  No longer experiencing pain or symptoms of [name]  Disposition Plan & Communication  Patient status: Inpatient Admitted From: Home Disposition: Home Anticipated discharge date: To be determined  Family Communication: Niece Consults, Procedures, Significant Events  Consultants:  Hematology oncology  Procedures/significant events:  Initiating chemotherapy  Objective   Vitals:   08/16/21 0100 08/16/21 0300 08/16/21 0600 08/16/21 0700  BP: 138/69 (!) 184/86 139/64 137/77  Pulse: 68 68 70 71  Resp: 18 19 19 14   Temp:      TempSrc:      SpO2: 94% 96% 95% 97%  Weight:      Height:       No intake or output data in the 24 hours ending 08/16/21 0805 Filed Weights   08/15/21 1332  Weight: (!) 144.7 kg    Patient BMI: Body mass index is 47.11 kg/m.   Physical Exam: General: awake, alert, NAD HEENT: atraumatic, clear conjunctiva, anicteric sclera, moist mucus membranes, hearing grossly normal.  Positive for positive mass left submandibular area Respiratory: normal respiratory effort. Cardiovascular: normal S1/S2,  RRR, no JVD, murmurs, rubs, gallops, quick capillary refill  Gastrointestinal: soft, NT, ND, no HSM felt Nervous: A&O x3. no gross focal neurologic deficits, normal speech Extremities: moves all equally,  no edema, normal tone Skin: dry, intact, normal temperature, normal color, No rashes, lesions or ulcers Psychiatry: normal mood, congruent affect  Labs   I have personally reviewed following labs and imaging studies Admission on 08/15/2021  Component Date Value Ref Range Status   WBC 08/15/2021 14.3 (A)  4.0 - 10.5 K/uL Final   RBC 08/15/2021  4.79  4.22 - 5.81 MIL/uL Final   Hemoglobin 08/15/2021 14.9  13.0 - 17.0 g/dL Final   HCT 08/15/2021 44.4  39.0 - 52.0 % Final   MCV 08/15/2021 92.7  80.0 - 100.0 fL Final   MCH 08/15/2021 31.1  26.0 - 34.0 pg Final   MCHC 08/15/2021 33.6  30.0 - 36.0 g/dL Final   RDW 08/15/2021 15.2  11.5 - 15.5 % Final   Platelets 08/15/2021 300  150 - 400 K/uL Final   nRBC 08/15/2021 0.0  0.0 - 0.2 % Final   Neutrophils Relative % 08/15/2021 83  % Final   Neutro Abs 08/15/2021 11.7 (A)  1.7 - 7.7 K/uL Final   Lymphocytes Relative 08/15/2021 10  % Final   Lymphs Abs 08/15/2021 1.4  0.7 - 4.0 K/uL Final   Monocytes Relative 08/15/2021 6  % Final   Monocytes Absolute 08/15/2021 0.9  0.1 - 1.0 K/uL Final   Eosinophils Relative 08/15/2021 1  % Final   Eosinophils Absolute 08/15/2021 0.2  0.0 - 0.5 K/uL Final   Basophils Relative 08/15/2021 0  % Final   Basophils Absolute 08/15/2021 0.1  0.0 - 0.1 K/uL Final   Immature Granulocytes 08/15/2021 0  % Final   Abs Immature Granulocytes 08/15/2021 0.06  0.00 - 0.07 K/uL Final   Sodium 08/15/2021 136  135 - 145 mmol/L Final   Potassium 08/15/2021 4.2  3.5 - 5.1 mmol/L Final   Chloride 08/15/2021 98  98 - 111 mmol/L Final   CO2 08/15/2021 31  22 - 32 mmol/L Final   Glucose, Bld 08/15/2021 129 (A)  70 - 99 mg/dL Final   BUN 08/15/2021 22  8 - 23 mg/dL Final   Creatinine, Ser 08/15/2021 1.17  0.61 - 1.24 mg/dL Final   Calcium 08/15/2021 8.8 (A)  8.9 - 10.3 mg/dL Final   Total Protein 08/15/2021 7.4  6.5 - 8.1 g/dL Final   Albumin 08/15/2021 3.7  3.5 - 5.0 g/dL Final   AST 08/15/2021 27  15 - 41 U/L Final   ALT 08/15/2021 46 (A)  0 - 44 U/L Final   Alkaline Phosphatase 08/15/2021 65  38 - 126 U/L Final   Total Bilirubin 08/15/2021 0.6  0.3 - 1.2 mg/dL Final   GFR, Estimated 08/15/2021 >60  >60 mL/min Final   Anion gap 08/15/2021 7  5 - 15 Final   B Natriuretic Peptide 08/15/2021 25.7  0.0 - 100.0 pg/mL Final   SARS Coronavirus 2 by RT PCR 08/15/2021 NEGATIVE   NEGATIVE Final   Influenza A by PCR 08/15/2021 NEGATIVE  NEGATIVE Final   Influenza B by PCR 08/15/2021 NEGATIVE  NEGATIVE Final   WBC 08/16/2021 13.7 (A)  4.0 - 10.5 K/uL Final   RBC 08/16/2021 4.53  4.22 - 5.81 MIL/uL Final   Hemoglobin 08/16/2021 13.7  13.0 - 17.0 g/dL Final   HCT 08/16/2021 41.5  39.0 - 52.0 % Final   MCV 08/16/2021 91.6  80.0 - 100.0 fL Final   MCH 08/16/2021 30.2  26.0 - 34.0 pg Final   MCHC 08/16/2021 33.0  30.0 - 36.0 g/dL Final   RDW 08/16/2021 15.0  11.5 - 15.5 % Final   Platelets 08/16/2021 264  150 - 400 K/uL Final   nRBC 08/16/2021 0.0  0.0 - 0.2 % Final   Neutrophils Relative % 08/16/2021 75  % Final   Neutro Abs 08/16/2021 10.2 (A)  1.7 - 7.7 K/uL Final   Lymphocytes Relative 08/16/2021 18  % Final   Lymphs Abs 08/16/2021 2.4  0.7 - 4.0 K/uL Final   Monocytes Relative 08/16/2021 6  % Final   Monocytes Absolute 08/16/2021 0.9  0.1 - 1.0 K/uL Final   Eosinophils Relative 08/16/2021 1  % Final   Eosinophils Absolute 08/16/2021 0.2  0.0 - 0.5 K/uL Final   Basophils Relative 08/16/2021 0  % Final   Basophils Absolute 08/16/2021 0.0  0.0 - 0.1 K/uL Final   Immature Granulocytes 08/16/2021 0  % Final   Abs Immature Granulocytes 08/16/2021 0.04  0.00 - 0.07 K/uL Final   Sodium 08/16/2021 137  135 - 145 mmol/L Final   Potassium 08/16/2021 3.8  3.5 - 5.1 mmol/L Final   Chloride 08/16/2021 102  98 - 111 mmol/L Final   CO2 08/16/2021 28  22 - 32 mmol/L Final   Glucose, Bld 08/16/2021 88  70 - 99 mg/dL Final   BUN 08/16/2021 16  8 - 23 mg/dL Final   Creatinine, Ser 08/16/2021 0.90  0.61 - 1.24 mg/dL Final   Calcium 08/16/2021 8.4 (A)  8.9 - 10.3 mg/dL Final   Total Protein 08/16/2021 6.9  6.5 - 8.1 g/dL Final   Albumin 08/16/2021 3.4 (A)  3.5 - 5.0 g/dL Final   AST 08/16/2021 27  15 - 41 U/L Final   ALT 08/16/2021 47 (A)  0 - 44 U/L Final   Alkaline Phosphatase 08/16/2021 57  38 - 126 U/L Final   Total Bilirubin 08/16/2021 0.8  0.3 - 1.2 mg/dL Final   GFR,  Estimated 08/16/2021 >60  >60 mL/min Final   Anion gap 08/16/2021 7  5 - 15 Final   Magnesium 08/16/2021 2.0  1.7 - 2.4 mg/dL Final   Uric Acid, Serum 08/16/2021 5.1  3.7 - 8.6 mg/dL Final    Imaging Studies  CT Angio Head W or Wo Contrast  Result Date: 08/15/2021 CLINICAL DATA:  [name] and hypoxia, 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 Multiplanar CT image reconstructions and MIPs were obtained to evaluate the vascular anatomy. Carotid stenosis measurements (when applicable) are obtained utilizing NASCET criteria, using the distal internal carotid diameter as the denominator. CONTRAST:  183mL OMNIPAQUE IOHEXOL 350 MG/ML SOLN COMPARISON:  CT neck 07/09/2021 FINDINGS: CT HEAD FINDINGS Brain: There is no acute intracranial hemorrhage, extra-axial fluid collection, or acute infarct. The ventricles are normal in size. There is no mass lesion. There is no midline shift. Vascular: No hyperdense vessel or unexpected calcification. The vasculature is assessed info below Skull: Normal. Negative for fracture or focal lesion. Sinuses: There is moderate mucosal thickening with layering fluid in the right maxillary sinus. There is surrounding hyperostosis consistent with chronic maxillary sinusitis. Orbits: The globes and orbits are unremarkable. Review of the MIP images confirms the above findings CTA NECK FINDINGS Aortic arch: The aortic arch is unremarkable. Right carotid system: The right common, internal, and external carotid arteries are patent with no significant stenosis, dissection, or aneurysm. Left carotid system: The proximal left common carotid artery is unremarkable.  There is complete encasement of the common carotid artery from the level of the clavicular head through the bifurcation with complete encasement of  the internal and external carotid arteries. The internal carotid artery is encased from the bifurcation through the petrous segment. The distal branches of the external carotid artery are not encased. There is no significant narrowing of the left carotid system. There is no evidence of dissection or aneurysm. There is minimal calcified atherosclerotic plaque in the carotid bulb. Vertebral arteries: The vertebral arteries are patent, with no significant stenosis, dissection, or aneurysm. Skeleton: There is multilevel degenerative change of the cervical spine. There is no acute osseous abnormality or aggressive osseous lesion. Other neck: There is a large infiltrative soft tissue mass throughout the soft tissues of the left neck. The mass measures up to approximately 8.9 cm AP by 9.3 cm TV by 13.8 cm cc, increased in size since 07/09/2021 when it measured approximately 6.6 cm TV by 6.0 cm AP by 10.0 cm cc, measured again using similar technique. Inferiorly, the mass extends into the upper mediastinum just posterior to the clavicle. The mass is inseparable from the sternocleidomastoid. As above, there is complete encasement of the carotid vasculature without narrowing of the arteries. The airway is shifted to the right and moderately narrowed at the level of the oropharynx. Upper chest: The lungs are assessed on the separately dictated CT chest. Review of the MIP images confirms the above findings CTA HEAD FINDINGS Anterior circulation: The bilateral intracranial ICAs are patent. The bilateral MCAs are patent The bilateral ACAs are patent. There is no aneurysm. Posterior circulation: The bilateral V4 segments are patent. The basilar artery is patent. The bilateral PCAs are patent. The posterior communicating arteries are not definitely identified. Venous sinuses: There is a hypodense filling defect in the right sigmoid sinus. Anatomic variants: As above. Review of the MIP images confirms the above findings IMPRESSION:  Since 07/09/2021: 1. Significant interval increase in size of the large mass in the left neck which extends from the skull base superiorly into the upper mediastinum inferiorly. The mass completely encases most of the left common carotid artery, the entire left internal carotid artery from the bifurcation to the skull base, and much of the left external carotid artery and branches. Despite the encasement, there is no significant narrowing of the left carotid system. The mass results in significant mass effect on surrounding structures and moderate narrowing of the oropharyngeal airway. 2. Otherwise, unremarkable vasculature of the head and neck. 3. Hypodense filling defect in the right sigmoid sinus concerning for nonocclusive thrombus. 4. Chronic right maxillary sinusitis. Electronically Signed   By: Valetta Mole M.D.   On: 08/15/2021 19:24   DG Chest 2 View  Result Date: 08/15/2021 CLINICAL DATA:  Hypoxia.  [name]. EXAM: CHEST - 2 VIEW COMPARISON:  07/31/2021 FINDINGS: Right arm PICC tip in the SVC 4 cm above the right atrium. Heart size is normal. The lungs are clear. No effusions. Left neck mass demonstrated with displacement of the trachea towards the right. IMPRESSION: No active cardiopulmonary disease. PICC tip in the SVC 4 cm above the right atrium. Known left neck mass. Electronically Signed   By: Nelson Chimes M.D.   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 Multiplanar CT image reconstructions and MIPs were obtained to evaluate the vascular anatomy. Carotid stenosis measurements (when applicable) are obtained utilizing NASCET criteria, using the distal internal carotid diameter as the  denominator. CONTRAST:  160mL OMNIPAQUE IOHEXOL 350 MG/ML SOLN COMPARISON:  CT neck 07/09/2021 FINDINGS: CT HEAD FINDINGS Brain: There is no acute intracranial hemorrhage, extra-axial fluid collection, or acute infarct. The ventricles are normal in size. There is no mass lesion. There is no midline shift. Vascular: No hyperdense vessel or unexpected calcification. The vasculature is assessed info below Skull: Normal. Negative for fracture or focal lesion. Sinuses: There is moderate mucosal thickening with layering fluid in the right maxillary sinus. There is surrounding hyperostosis consistent with chronic maxillary sinusitis. Orbits: The globes and orbits are unremarkable. Review of the MIP images confirms the above findings CTA NECK FINDINGS Aortic arch: The aortic arch is unremarkable. Right carotid system: The right common, internal, and external carotid arteries are patent with no significant stenosis, dissection, or aneurysm. Left carotid system: The proximal left common carotid artery is unremarkable. There is complete encasement of the common carotid artery from the level of the clavicular head through the bifurcation with complete encasement of the internal and external carotid arteries. The internal carotid artery is encased from the bifurcation through the petrous segment. The distal branches of the external carotid artery are not encased. There is no significant narrowing of the left carotid system. There is no evidence of dissection or aneurysm. There is minimal calcified atherosclerotic plaque in the carotid bulb. Vertebral arteries: The vertebral arteries are patent, with no significant stenosis, dissection, or aneurysm. Skeleton: There is multilevel degenerative change of the cervical spine. There is no acute osseous abnormality or aggressive osseous lesion. Other neck: There is a large infiltrative soft tissue mass throughout the soft tissues of the left neck. The mass measures up to approximately  8.9 cm AP by 9.3 cm TV by 13.8 cm cc, increased in size since 07/09/2021 when it measured approximately 6.6 cm TV by 6.0 cm AP by 10.0 cm cc, measured again using similar technique. Inferiorly, the mass extends into the upper mediastinum just posterior to the clavicle. The mass is inseparable from the sternocleidomastoid. As above, there is complete encasement of the carotid vasculature without narrowing of the arteries. The airway is shifted to the right and moderately narrowed at the level of the oropharynx. Upper chest: The lungs are assessed on the separately dictated CT chest. Review of the MIP images confirms the above findings CTA HEAD FINDINGS Anterior circulation: The bilateral intracranial ICAs are patent. The bilateral MCAs are patent The bilateral ACAs are patent. There is no aneurysm. Posterior circulation: The bilateral V4 segments are patent. The basilar artery is patent. The bilateral PCAs are patent. The posterior communicating arteries are not definitely identified. Venous sinuses: There is a hypodense filling defect in the right sigmoid sinus. Anatomic variants: As above. Review of the MIP images confirms the above findings IMPRESSION: Since 07/09/2021: 1. Significant interval increase in size of the large mass in the left neck which extends from the skull base superiorly into the upper mediastinum inferiorly. The mass completely encases most of the left common carotid artery, the entire left internal carotid artery from the bifurcation to the skull base, and much of the left external carotid artery and branches. Despite the encasement, there is no significant narrowing of the left carotid system. The mass results in significant mass effect on surrounding structures and moderate  narrowing of the oropharyngeal airway. 2. Otherwise, unremarkable vasculature of the head and neck. 3. Hypodense filling defect in the right sigmoid sinus concerning for nonocclusive thrombus. 4. Chronic right maxillary  sinusitis. Electronically Signed   By: Valetta Mole M.D.   On: 08/15/2021 19:24   CT Angio Chest PE W and/or Wo Contrast  Result Date: 08/15/2021 CLINICAL DATA:  Concern for pulmonary embolism. EXAM: CT ANGIOGRAPHY CHEST WITH CONTRAST TECHNIQUE: Multidetector CT imaging of the chest was performed using the standard protocol during bolus administration of intravenous contrast. Multiplanar CT image reconstructions and MIPs were obtained to evaluate the vascular anatomy. CONTRAST:  135mL OMNIPAQUE IOHEXOL 350 MG/ML SOLN COMPARISON:  Chest CT dated 07/31/2021. FINDINGS: Cardiovascular: There is no cardiomegaly or pericardial effusion. The thoracic aorta is unremarkable. Evaluation of the pulmonary arteries is limited due to respiratory motion artifact and suboptimal visualization of the peripheral branches. No large or central pulmonary artery embolus identified. Right sided PICC now extends superiorly into the right IJ. The PICC appears to bend in the right IJ and turns downward. The tip of the PICC appears to be in the right IJ at the level of the subclavian vein (33/6 and coronal 91/7). Evaluation however is limited due to presence of intravenous contrast. Mediastinum/Nodes: No hilar or mediastinal adenopathy. The esophagus is grossly unremarkable. Partially visualized mass in the left side of the neck measures 9.1 x 8.4 cm and increase in size since the study of 07/09/2021 (previously measuring approximately 7.1 x 6.0 cm). There is associated mass effect and deviation of the trachea to the right. No mediastinal fluid collection. Lungs/Pleura: No focal consolidation, pleural effusion, or pneumothorax. The central airways are patent. Upper Abdomen: No acute abnormality. Musculoskeletal: Degenerative changes of the spine. No acute osseous pathology. Review of the MIP images confirms the above findings. IMPRESSION: 1. No CT evidence of central pulmonary artery embolus. 2. Right sided PICC now extends superiorly  into the right IJ. The tip of the PICC appears to be in the right IJ at the level of the subclavian vein. Evaluation however is limited due to presence of intravenous contrast. 3. Partially visualized mass in the left side of the neck has increased in size since the study of 07/09/2021 and is concerning for malignancy. These results were called by telephone at the time of interpretation on 08/15/2021 at 7:39 pm to Dr. Gilford Raid, who verbally acknowledged these results. Electronically Signed   By: Anner Crete M.D.   On: 08/15/2021 19:49   Medications   Scheduled Meds:  allopurinol  300 mg Oral Daily   amLODipine  10 mg Oral Daily   enoxaparin (LOVENOX) injection  1 mg/kg Subcutaneous Q12H   No recently discontinued medications to reconcile  LOS: 0 days   Time spent: >68min  Curley Hogen L Calissa Swenor, DO [facility] 08/16/2021, 8:05 AM   Please refer to amion to contact the [facility] Attending or Consulting provider for this pt  [email or web address] Available by Epic secure chat 7AM-7PM. If 7PM-7AM, please contact night-coverage

## 2021-08-16 NOTE — Progress Notes (Signed)
Order noted for nocturnal CPAP. Spoke with patient who denies OSA but states his PCP has talked about sending him for a sleep study. He denies use of CPAP at home at this time or in the past and states he has only used a mask once in Sept 2022 when he was having complications in a [place] hospital.  Seeing the mass effect on the airway and vasculature of the left neck region, CPAP has been held for now. All hospital available apparatus will place pressure upon the mass, which is unwanted risk. Spoke with Gershon Cull, NP who agrees at this time. Plan is to place patient on continuous pulse ox tonight.

## 2021-08-16 NOTE — Progress Notes (Addendum)
HEMATOLOGY-ONCOLOGY PROGRESS NOTE  SUBJECTIVE: Joe [name] was seen in the emergency department this morning.  He is being admitted following a syncopal episode.  Tells me that he had a syncopal episode yesterday does not recall the events surrounding it.  Also tells me that he had pain on the left side of his neck that radiated up to his head.  At this time, he currently denies dizziness and sensation of syncope.  Pain is controlled he is not having any chest pain or shortness of breath.  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   Follicular lymphoma ([place])   08/13/2021 -  Chemotherapy   Patient is on Treatment Plan : NON-HODGKINS LYMPHOMA R-CHOP q21d      REVIEW OF SYSTEMS:   Constitutional: Denies fevers, chills  Eyes: Denies blurriness of vision Ears, nose, mouth, throat, and face: Denies mucositis or sore throat Respiratory: Denies cough, dyspnea or wheezes Cardiovascular: Denies palpitation, chest discomfort Gastrointestinal:  Denies nausea, heartburn or change in bowel habits Skin: Denies abnormal skin rashes Lymphatics: He states that left neck mass has not changed in size significantly Neurological:Denies numbness, tingling or new weaknesses Behavioral/Psych: Mood is stable, no new changes  Extremities: No lower extremity edema All other systems were reviewed with the patient and are negative.  I have reviewed the past medical history, past surgical history, social history and family history with the patient and they are unchanged from previous note.   PHYSICAL EXAMINATION: ECOG PERFORMANCE STATUS: 1 - Symptomatic but completely ambulatory  Vitals:   08/16/21 0700 08/16/21 0900  BP: 137/77 (!) 106/95  Pulse: 71 73  Resp: 14 19  Temp:    SpO2: 97% 96%   Filed Weights   08/15/21 1332  Weight: (!) 144.7 kg    Intake/Output from previous day: No intake/output data recorded.  GENERAL:alert, no distress and comfortable SKIN: skin  color, texture, turgor are normal, no rashes or significant lesions EYES: normal, Conjunctiva are pink and non-injected, sclera clear OROPHARYNX:no exudate, no erythema and lips, buccal mucosa, and tongue normal  LYMPH: Large left neck palpable lymphadenopathy, smaller right neck lymphadenopathy  LUNGS: clear to auscultation and percussion with normal breathing effort HEART: regular rate & rhythm and no murmurs and no lower extremity edema ABDOMEN:abdomen soft, non-tender and normal bowel sounds NEURO: alert & oriented x 3 with fluent speech, no focal motor/sensory deficits  LABORATORY DATA:  I have reviewed the data as listed CMP Latest Ref Rng & Units 08/16/2021 08/15/2021 08/09/2021  Glucose 70 - 99 mg/dL 88 129(H) 95  BUN 8 - 23 mg/dL 16 22 26(H)  Creatinine 0.61 - 1.24 mg/dL 0.90 1.17 1.17  Sodium 135 - 145 mmol/L 137 136 138  Potassium 3.5 - 5.1 mmol/L 3.8 4.2 3.9  Chloride 98 - 111 mmol/L 102 98 102  CO2 22 - 32 mmol/L _0 Calcium 8.9 - 10.3 mg/dL 8.4(L) 8.8(L) 9.5  Total Protein 6.5 - 8.1 g/dL 6.9 7.4 7.6  Total Bilirubin 0.3 - 1.2 mg/dL 0.8 0.6 0.5  Alkaline Phos 38 - 126 U/L 57 65 80  AST 15 - 41 U/L _1 ALT 0 - 44 U/L 47(H) 46(H) 43    Lab Results  Component Value Date   WBC 13.7 (H) 08/16/2021   HGB 13.7 08/16/2021   HCT 41.5 08/16/2021   MCV 91.6 08/16/2021   PLT 264 08/16/2021   NEUTROABS 10.2 (H) 08/16/2021    CT Angio Head W or Wo Contrast  Result Date: 08/15/2021 CLINICAL DATA:  Syncope and hypoxia, large neck mass due to non-Hodgkin's lymphoma EXAM: CT ANGIOGRAPHY HEAD AND NECK TECHNIQUE: Multidetector CT imaging of the head and neck was performed using the standard protocol during bolus administration of intravenous contrast. Multiplanar CT image reconstructions and MIPs were obtained to evaluate the vascular anatomy. Carotid stenosis measurements (when applicable) are obtained utilizing NASCET criteria, using the distal internal carotid diameter  as the denominator. CONTRAST:  171mL OMNIPAQUE IOHEXOL 350 MG/ML SOLN COMPARISON:  CT neck 07/09/2021 FINDINGS: CT HEAD FINDINGS Brain: There is no acute intracranial hemorrhage, extra-axial fluid collection, or acute infarct. The ventricles are normal in size. There is no mass lesion. There is no midline shift. Vascular: No hyperdense vessel or unexpected calcification. The vasculature is assessed info below Skull: Normal. Negative for fracture or focal lesion. Sinuses: There is moderate mucosal thickening with layering fluid in the right maxillary sinus. There is surrounding hyperostosis consistent with chronic maxillary sinusitis. Orbits: The globes and orbits are unremarkable. Review of the MIP images confirms the above findings CTA NECK FINDINGS Aortic arch: The aortic arch is unremarkable. Right carotid system: The right common, internal, and external carotid arteries are patent with no significant stenosis, dissection, or aneurysm. Left carotid system: The proximal left common carotid artery is unremarkable. There is complete encasement of the common carotid artery from the level of the clavicular head through the bifurcation with complete encasement of the internal and external carotid arteries. The internal carotid artery is encased from the bifurcation through the petrous segment. The distal branches of the external carotid artery are not encased. There is no significant narrowing of the left carotid system. There is no evidence of dissection or aneurysm. There is minimal calcified atherosclerotic plaque in the carotid bulb. Vertebral arteries: The vertebral arteries are patent, with no significant stenosis, dissection, or aneurysm. Skeleton: There is multilevel degenerative change of the cervical spine. There is no acute osseous abnormality or aggressive osseous lesion. Other neck: There is a large infiltrative soft tissue mass throughout the soft tissues of the left neck. The mass measures up to  approximately 8.9 cm AP by 9.3 cm TV by 13.8 cm cc, increased in size since 07/09/2021 when it measured approximately 6.6 cm TV by 6.0 cm AP by 10.0 cm cc, measured again using similar technique. Inferiorly, the mass extends into the upper mediastinum just posterior to the clavicle. The mass is inseparable from the sternocleidomastoid. As above, there is complete encasement of the carotid vasculature without narrowing of the arteries. The airway is shifted to the right and moderately narrowed at the level of the oropharynx. Upper chest: The lungs are assessed on the separately dictated CT chest. Review of the MIP images confirms the above findings CTA HEAD FINDINGS Anterior circulation: The bilateral intracranial ICAs are patent. The bilateral MCAs are patent The bilateral ACAs are patent. There is no aneurysm. Posterior circulation: The bilateral V4 segments are patent. The basilar artery is patent. The bilateral PCAs are patent. The posterior communicating arteries are not definitely identified. Venous sinuses: There is a hypodense filling defect in the right sigmoid sinus. Anatomic variants: As above. Review of the MIP images confirms the above findings IMPRESSION: Since 07/09/2021: 1. Significant interval increase in size of the large mass in the left neck which extends from the skull base superiorly into the upper mediastinum inferiorly. The mass completely encases most of the left common carotid artery, the entire left internal carotid artery from the bifurcation to the skull  base, and much of the left external carotid artery and branches. Despite the encasement, there is no significant narrowing of the left carotid system. The mass results in significant mass effect on surrounding structures and moderate narrowing of the oropharyngeal airway. 2. Otherwise, unremarkable vasculature of the head and neck. 3. Hypodense filling defect in the right sigmoid sinus concerning for nonocclusive thrombus. 4. Chronic  right maxillary sinusitis. Electronically Signed   By: Valetta Mole M.D.   On: 08/15/2021 19:24   DG Chest 2 View  Result Date: 08/15/2021 CLINICAL DATA:  Hypoxia.  Lymphoma. EXAM: CHEST - 2 VIEW COMPARISON:  07/31/2021 FINDINGS: Right arm PICC tip in the SVC 4 cm above the right atrium. Heart size is normal. The lungs are clear. No effusions. Left neck mass demonstrated with displacement of the trachea towards the right. IMPRESSION: No active cardiopulmonary disease. PICC tip in the SVC 4 cm above the right atrium. Known left neck mass. Electronically Signed   By: Nelson Chimes M.D.   On: 08/15/2021 14:43   DG Chest 2 View  Result Date: 07/28/2021 CLINICAL DATA:  Seizure-like activity. EXAM: CHEST - 2 VIEW COMPARISON:  June 08, 2019 FINDINGS: The heart size and mediastinal contours are within normal limits. Both lungs are clear. The visualized skeletal structures are unremarkable. IMPRESSION: No active cardiopulmonary disease. Electronically Signed   By: Fidela Salisbury M.D.   On: 07/28/2021 14:51   CT Head Wo Contrast  Result Date: 07/28/2021 CLINICAL DATA:  Seizure activity, frontal scalp abrasion EXAM: CT HEAD WITHOUT CONTRAST TECHNIQUE: Contiguous axial images were obtained from the base of the skull through the vertex without intravenous contrast. COMPARISON:  None. FINDINGS: Brain: No acute infarct or hemorrhage. Lateral ventricles and midline structures are unremarkable. No acute extra-axial fluid collections. No mass effect. Vascular: No hyperdense vessel or unexpected calcification. Skull: Normal. Negative for fracture or focal lesion. Sinuses/Orbits: Mild mucosal thickening within the maxillary sinuses. Remaining paranasal sinuses are clear. Other: None. IMPRESSION: 1. No acute intracranial process. Electronically Signed   By: Randa Ngo M.D.   On: 07/28/2021 18:39   CT Angio Neck W and/or Wo Contrast  Result Date: 08/15/2021 CLINICAL DATA:  Syncope and hypoxia, large neck mass  due to non-Hodgkin's lymphoma EXAM: CT ANGIOGRAPHY HEAD AND NECK TECHNIQUE: Multidetector CT imaging of the head and neck was performed using the standard protocol during bolus administration of intravenous contrast. Multiplanar CT image reconstructions and MIPs were obtained to evaluate the vascular anatomy. Carotid stenosis measurements (when applicable) are obtained utilizing NASCET criteria, using the distal internal carotid diameter as the denominator. CONTRAST:  176mL OMNIPAQUE IOHEXOL 350 MG/ML SOLN COMPARISON:  CT neck 07/09/2021 FINDINGS: CT HEAD FINDINGS Brain: There is no acute intracranial hemorrhage, extra-axial fluid collection, or acute infarct. The ventricles are normal in size. There is no mass lesion. There is no midline shift. Vascular: No hyperdense vessel or unexpected calcification. The vasculature is assessed info below Skull: Normal. Negative for fracture or focal lesion. Sinuses: There is moderate mucosal thickening with layering fluid in the right maxillary sinus. There is surrounding hyperostosis consistent with chronic maxillary sinusitis. Orbits: The globes and orbits are unremarkable. Review of the MIP images confirms the above findings CTA NECK FINDINGS Aortic arch: The aortic arch is unremarkable. Right carotid system: The right common, internal, and external carotid arteries are patent with no significant stenosis, dissection, or aneurysm. Left carotid system: The proximal left common carotid artery is unremarkable. There is complete encasement of the common carotid artery  from the level of the clavicular head through the bifurcation with complete encasement of the internal and external carotid arteries. The internal carotid artery is encased from the bifurcation through the petrous segment. The distal branches of the external carotid artery are not encased. There is no significant narrowing of the left carotid system. There is no evidence of dissection or aneurysm. There is minimal  calcified atherosclerotic plaque in the carotid bulb. Vertebral arteries: The vertebral arteries are patent, with no significant stenosis, dissection, or aneurysm. Skeleton: There is multilevel degenerative change of the cervical spine. There is no acute osseous abnormality or aggressive osseous lesion. Other neck: There is a large infiltrative soft tissue mass throughout the soft tissues of the left neck. The mass measures up to approximately 8.9 cm AP by 9.3 cm TV by 13.8 cm cc, increased in size since 07/09/2021 when it measured approximately 6.6 cm TV by 6.0 cm AP by 10.0 cm cc, measured again using similar technique. Inferiorly, the mass extends into the upper mediastinum just posterior to the clavicle. The mass is inseparable from the sternocleidomastoid. As above, there is complete encasement of the carotid vasculature without narrowing of the arteries. The airway is shifted to the right and moderately narrowed at the level of the oropharynx. Upper chest: The lungs are assessed on the separately dictated CT chest. Review of the MIP images confirms the above findings CTA HEAD FINDINGS Anterior circulation: The bilateral intracranial ICAs are patent. The bilateral MCAs are patent The bilateral ACAs are patent. There is no aneurysm. Posterior circulation: The bilateral V4 segments are patent. The basilar artery is patent. The bilateral PCAs are patent. The posterior communicating arteries are not definitely identified. Venous sinuses: There is a hypodense filling defect in the right sigmoid sinus. Anatomic variants: As above. Review of the MIP images confirms the above findings IMPRESSION: Since 07/09/2021: 1. Significant interval increase in size of the large mass in the left neck which extends from the skull base superiorly into the upper mediastinum inferiorly. The mass completely encases most of the left common carotid artery, the entire left internal carotid artery from the bifurcation to the skull base,  and much of the left external carotid artery and branches. Despite the encasement, there is no significant narrowing of the left carotid system. The mass results in significant mass effect on surrounding structures and moderate narrowing of the oropharyngeal airway. 2. Otherwise, unremarkable vasculature of the head and neck. 3. Hypodense filling defect in the right sigmoid sinus concerning for nonocclusive thrombus. 4. Chronic right maxillary sinusitis. Electronically Signed   By: Valetta Mole M.D.   On: 08/15/2021 19:24   CT Angio Chest PE W and/or Wo Contrast  Result Date: 08/15/2021 CLINICAL DATA:  Concern for pulmonary embolism. EXAM: CT ANGIOGRAPHY CHEST WITH CONTRAST TECHNIQUE: Multidetector CT imaging of the chest was performed using the standard protocol during bolus administration of intravenous contrast. Multiplanar CT image reconstructions and MIPs were obtained to evaluate the vascular anatomy. CONTRAST:  146mL OMNIPAQUE IOHEXOL 350 MG/ML SOLN COMPARISON:  Chest CT dated 07/31/2021. FINDINGS: Cardiovascular: There is no cardiomegaly or pericardial effusion. The thoracic aorta is unremarkable. Evaluation of the pulmonary arteries is limited due to respiratory motion artifact and suboptimal visualization of the peripheral branches. No large or central pulmonary artery embolus identified. Right sided PICC now extends superiorly into the right IJ. The PICC appears to bend in the right IJ and turns downward. The tip of the PICC appears to be in the right IJ at  the level of the subclavian vein (33/6 and coronal 91/7). Evaluation however is limited due to presence of intravenous contrast. Mediastinum/Nodes: No hilar or mediastinal adenopathy. The esophagus is grossly unremarkable. Partially visualized mass in the left side of the neck measures 9.1 x 8.4 cm and increase in size since the study of 07/09/2021 (previously measuring approximately 7.1 x 6.0 cm). There is associated mass effect and deviation of  the trachea to the right. No mediastinal fluid collection. Lungs/Pleura: No focal consolidation, pleural effusion, or pneumothorax. The central airways are patent. Upper Abdomen: No acute abnormality. Musculoskeletal: Degenerative changes of the spine. No acute osseous pathology. Review of the MIP images confirms the above findings. IMPRESSION: 1. No CT evidence of central pulmonary artery embolus. 2. Right sided PICC now extends superiorly into the right IJ. The tip of the PICC appears to be in the right IJ at the level of the subclavian vein. Evaluation however is limited due to presence of intravenous contrast. 3. Partially visualized mass in the left side of the neck has increased in size since the study of 07/09/2021 and is concerning for malignancy. These results were called by telephone at the time of interpretation on 08/15/2021 at 7:39 pm to Dr. Gilford Raid, who verbally acknowledged these results. Electronically Signed   By: Anner Crete M.D.   On: 08/15/2021 19:49   CT Angio Chest PE W and/or Wo Contrast  Addendum Date: 07/31/2021   ADDENDUM REPORT: 07/31/2021 19:50 ADDENDUM: These results were called by telephone at the time of interpretation on 07/31/2021 at 7:48 pm to provider JOSEPH ZAMMIT , who verbally acknowledged these results. Electronically Signed   By: Iven Finn M.D.   On: 07/31/2021 19:50   Result Date: 07/31/2021 CLINICAL DATA:  PE suspected, low/intermediate prob, neg D-dimer EXAM: CT ANGIOGRAPHY CHEST WITH CONTRAST TECHNIQUE: Multidetector CT imaging of the chest was performed using the standard protocol during bolus administration of intravenous contrast. Multiplanar CT image reconstructions and MIPs were obtained to evaluate the vascular anatomy. CONTRAST:  158mL OMNIPAQUE IOHEXOL 350 MG/ML SOLN COMPARISON:  None. FINDINGS: Cardiovascular: Satisfactory opacification of the pulmonary arteries to the segmental level. Segmental and subsegmental right lower lobe pulmonary  embolus. Segmental and subsegmental right middle lobe pulmonary embolus. Limited evaluation of left lower lobe with question subsegmental pulmonary emboli. Normal heart size. No pericardial effusion. Mediastinum/Nodes: No enlarged mediastinal, hilar, or axillary lymph nodes. Thyroid gland, trachea, and esophagus demonstrate no significant findings. Lungs/Pleura: Limited evaluation due to motion artifact. Expiratory phase of respiration. Mosaic attenuation of the lungs. No focal consolidation. No pulmonary nodule. No pulmonary mass. No pleural effusion. No pneumothorax. Upper Abdomen: Possible mild hepatic steatosis. Musculoskeletal: No chest wall abnormality. No suspicious lytic or blastic osseous lesions. No acute displaced fracture. Multilevel degenerative changes of the spine. Review of the MIP images confirms the above findings. IMPRESSION: Segmental and subsegmental right lower and middle lobe pulmonary emboli. Question left lower lobe subsegmental pulmonary emboli. No findings of right heart strain or pulmonary infarction. Electronically Signed: By: Iven Finn M.D. 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 EXAM: NUCLEAR MEDICINE PET SKULL BASE TO THIGH TECHNIQUE: 16.9 mCi F-18 FDG was injected intravenously. Full-ring PET imaging was performed from the skull base to thigh after the radiotracer. CT data was obtained and used for attenuation correction and anatomic localization. Fasting blood glucose: 121 mg/dl COMPARISON:  None FINDINGS: Mediastinal blood  pool activity: SUV max 3.78 Liver activity: SUV max 4.33 NECK: There is a large nodal mass within the left cervical chain extending from the level 2 region through level 4 and 5. This measures the 9.6 x 8.5 by 13.9 cm and has an SUV max 26.47, image 43/4. The tumor extends into the superior mediastinum along with the left  paratracheal region, image 56/4. There is also tumor extension into the prevertebral soft tissue space at the level of the oropharynx with involvement of the right tonsillar fossa. Tumor also extends into the prevertebral soft tissues at the level of the hypopharynx with mass effect upon the hypopharynx with deviation towards the right. Incidental CT findings: none CHEST: Mild FDG uptake associated with 9 mm subcarinal lymph node within SUV max of 3.68, image 83/4. No FDG avid pulmonary nodule. Incidental CT findings: Mild aortic atherosclerosis and coronary artery calcifications. No pleural effusion. ABDOMEN/PELVIS: No abnormal FDG uptake identified within the liver. Within the neck of pancreas there is a approximately 1.4 cm focus of increased uptake with SUV max of 7.88. Within the body of the pancreas there is a focus of increased uptake measuring 2 cm with SUV max of 14.0. Normal size spleen without focal areas of increased uptake. Adrenal glands appear normal. No FDG avid lymph nodes within the abdomen or pelvis. FDG avid right inguinal node measures 7 mm within SUV max of 6.96, image 199/4. Incidental CT findings: Large fat containing umbilical hernia measures 8.0 x 6.3 cm. Small bilateral fat containing inguinal hernias. SKELETON: There are a few scattered FDG avid bone lesions, without corresponding CT changes. For example, within the left humeral head there is a moderate to intense focus of increased uptake within SUV max of 9.87. Within the transverse process the T10 vertebra on the left there is a focus of increased uptake within SUV max of 8.85. Within the inferior endplate of the L2 vertebral body there is a small focus of increased uptake with SUV max of 8.27. Incidental CT findings: none IMPRESSION: 1. Examination is positive for large FDG avid mass within the left neck extending from the level 2 nodal chain into the left side of the superior mediastinum compatible with metabolically active tumor. 2.  There are 2 foci of increased uptake within the neck and body of pancreas lymphomatous involvement of the pancreas. 3. Subcentimeter FDG avid right inguinal lymph node also concerning for lymphoma. 4. Several foci of increased uptake within the axial skeleton compatible with malignancy. Electronically Signed   By: Kerby Moors M.D.   On: 08/14/2021 06:22   US Venous Img Lower Bilateral (DVT)  Result Date: 08/01/2021 CLINICAL DATA:  Pulmonary emboli on CTA chest 07/31/2021. Lower extremity edema. EXAM: Bilateral LOWER EXTREMITY VENOUS DOPPLER ULTRASOUND TECHNIQUE: Gray-scale sonography with compression, as well as color and duplex ultrasound, were performed to evaluate the deep venous system(s) from the level of the common femoral vein through the popliteal and proximal calf veins. COMPARISON:  None. FINDINGS: VENOUS Left side: Substantial thrombus in the superficial femoral vein and profundus femoral vein on the left, with no significant compressibility, partially occlusive but not completely occlusive. This extends into the posterior tibial veins and peroneal veins as well as the popliteal vein. The common femoral vein appears patent. Right side: Normal appearance of the common femoral vein, superficial femoral vein, popliteal vein, and calf veins on the right. Expected compressibility and augmentation. OTHER None. Limitations: none IMPRESSION: 1. Positive for left-sided DVT with left superficial femoral vein, profundus femoral  vein, popliteal vein, and calf vein thrombosis on the left side, in this patient with recent diagnosis of acute pulmonary embolus. No findings of DVT on the right side. Electronically Signed   By: Van Clines M.D.   On: 08/01/2021 15:50   DG Chest Port 1 View  Result Date: 07/31/2021 CLINICAL DATA:  Wheezing, history of COVID positive. EXAM: PORTABLE CHEST 1 VIEW COMPARISON:  July 28, 2021 FINDINGS: The heart size and mediastinal contours are within normal limits. Both  lungs are clear. The visualized skeletal structures are unremarkable. IMPRESSION: No active disease. Electronically Signed   By: Virgina Norfolk M.D.   On: 07/31/2021 16:22   ECHOCARDIOGRAM COMPLETE  Result Date: 08/01/2021    ECHOCARDIOGRAM REPORT   Patient Name:   MACAULAY REICHER Date of Exam: 08/01/2021 Medical Rec #:  [id number]       Height:       69.0 in Accession #:    [id number]      Weight:       323.4 lb Date of Birth:  07/07/1956       BSA:          2.534 m Patient Age:    77 years        BP:           149/82 mmHg Patient Gender: M               HR:           95 bpm. Exam Location:  [facility] Procedure: 2D Echo, Cardiac Doppler and Color Doppler Indications:    Pulmonary Embolus I26.09  History:        Patient has no prior history of Echocardiogram examinations.                 Risk Factors:Hypertension and Dyslipidemia. Obesity. OSA                 (obstructive sleep apnea). Covid-19 infection. Cancer ([place])                 (From Hx).  Sonographer:    Alvino Chapel RCS Referring Phys: [id number] [place]  1. Left ventricular ejection fraction, by estimation, is 60 to 65%. The left ventricle has normal function. The left ventricle has no regional wall motion abnormalities. Left ventricular diastolic parameters were normal.  2. Right ventricular systolic function is normal. The right ventricular size is normal. Tricuspid regurgitation signal is inadequate for assessing PA pressure.  3. The mitral valve is grossly normal. No evidence of mitral valve regurgitation. No evidence of mitral stenosis.  4. The aortic valve is tricuspid. Aortic valve regurgitation is not visualized. No aortic stenosis is present.  5. The inferior vena cava is normal in size with greater than 50% respiratory variability, suggesting right atrial pressure of 3 mmHg. Conclusion(s)/Recommendation(s): Normal biventricular function without evidence of hemodynamically significant valvular heart disease. FINDINGS   Left Ventricle: Left ventricular ejection fraction, by estimation, is 60 to 65%. The left ventricle has normal function. The left ventricle has no regional wall motion abnormalities. The left ventricular internal cavity size was normal in size. There is  no left ventricular hypertrophy. Left ventricular diastolic parameters were normal. Right Ventricle: The right ventricular size is normal. No increase in right ventricular wall thickness. Right ventricular systolic function is normal. Tricuspid regurgitation signal is inadequate for assessing PA pressure. Left Atrium: Left atrial size was normal in size. Right Atrium: Right atrial size was normal  in size. Pericardium: Trivial pericardial effusion is present. Presence of pericardial fat pad. Mitral Valve: The mitral valve is grossly normal. No evidence of mitral valve regurgitation. No evidence of mitral valve stenosis. Tricuspid Valve: The tricuspid valve is grossly normal. Tricuspid valve regurgitation is trivial. No evidence of tricuspid stenosis. Aortic Valve: The aortic valve is tricuspid. Aortic valve regurgitation is not visualized. No aortic stenosis is present. Aortic valve mean gradient measures 8.0 mmHg. Aortic valve peak gradient measures 16.5 mmHg. Aortic valve area, by VTI measures 2.63  cm. Pulmonic Valve: The pulmonic valve was grossly normal. Pulmonic valve regurgitation is not visualized. No evidence of pulmonic stenosis. Aorta: The aortic root is normal in size and structure. Venous: The right lower pulmonary vein is normal. The inferior vena cava is normal in size with greater than 50% respiratory variability, suggesting right atrial pressure of 3 mmHg. IAS/Shunts: The atrial septum is grossly normal.  LEFT VENTRICLE PLAX 2D LVIDd:         4.50 cm   Diastology LVIDs:         2.70 cm   LV e' medial:    11.10 cm/s LV PW:         1.10 cm   LV E/e' medial:  7.6 LV IVS:        1.10 cm   LV e' lateral:   12.30 cm/s LVOT diam:     2.00 cm   LV E/e'  lateral: 6.8 LV SV:         97 LV SV Index:   38 LVOT Area:     3.14 cm  RIGHT VENTRICLE RV S prime:     21.10 cm/s TAPSE (M-mode): 3.2 cm LEFT ATRIUM             Index        RIGHT ATRIUM           Index LA diam:        3.60 cm 1.42 cm/m   RA Area:     11.70 cm LA Vol (A2C):   33.8 ml 13.34 ml/m  RA Volume:   26.20 ml  10.34 ml/m LA Vol (A4C):   48.7 ml 19.22 ml/m LA Biplane Vol: 42.7 ml 16.85 ml/m  AORTIC VALVE AV Area (Vmax):    2.62 cm AV Area (Vmean):   2.82 cm AV Area (VTI):     2.63 cm AV Vmax:           203.00 cm/s AV Vmean:          135.000 cm/s AV VTI:            0.371 m AV Peak Grad:      16.5 mmHg AV Mean Grad:      8.0 mmHg LVOT Vmax:         169.00 cm/s LVOT Vmean:        121.000 cm/s LVOT VTI:          0.310 m LVOT/AV VTI ratio: 0.84  AORTA Ao Root diam: 3.40 cm MITRAL VALVE MV Area (PHT): 2.83 cm     SHUNTS MV Decel Time: 268 msec     Systemic VTI:  0.31 m MV E velocity: 83.90 cm/s   Systemic Diam: 2.00 cm MV A velocity: 112.00 cm/s MV E/A ratio:  0.75 Eleonore Chiquito MD Electronically signed by Eleonore Chiquito MD Signature Date/Time: 08/01/2021/4:20:57 PM    Final    IR PICC PLACEMENT RIGHT >5 YRS INC IMG GUIDE  Result Date:  08/14/2021 INDICATION: Recent diagnosis of lymphoma. Request made for placement of a PICC line for the initiation of chemotherapy Note, initial plan was to proceed with port a catheter placement however the patient has recently been diagnosed with DVT and pulmonary embolism and referring oncologist has requested a PICC line initially as the patient is begun on anticoagulation. EXAM: ULTRASOUND AND FLUOROSCOPIC GUIDED PICC LINE INSERTION MEDICATIONS: None. CONTRAST:  None FLUOROSCOPY TIME:  18 seconds (2 mGy) COMPLICATIONS: None immediate. TECHNIQUE: The procedure, risks, benefits, and alternatives were explained to the patient's niece and informed written consent was obtained. A timeout was performed prior to the initiation of the procedure. The right upper extremity  was prepped with chlorhexidine in a sterile fashion, and a sterile drape was applied covering the operative field. Maximum barrier sterile technique with sterile gowns and gloves were used for the procedure. A timeout was performed prior to the initiation of the procedure. Local anesthesia was provided with 1% lidocaine. Under direct ultrasound guidance, the brachial vein was accessed with a micropuncture kit after the overlying soft tissues were anesthetized with 1% lidocaine. Real-time ultrasound guidance was utilized for vascular access including the acquisition of a permanent ultrasound image documenting patency of the accessed vessel. A guidewire was advanced to the level of the superior caval-atrial junction for measurement purposes and the PICC line was cut to length. A peel-away sheath was placed and a 40 cm, 5 [place], dual lumen was inserted to level of the superior caval-atrial junction. A post procedure spot fluoroscopic was obtained. The catheter easily aspirated and flushed and was secured in place with stat lock device. A dressing was applied. The patient tolerated the procedure well without immediate post procedural complication. FINDINGS: After catheter placement, the tip lies within the superior cavoatrial junction. The catheter aspirates and flushes normally and is ready for immediate use. IMPRESSION: Successful ultrasound and fluoroscopic guided placement of a right brachial vein approach, 40 cm, 5 French, dual lumen PICC with tip at the superior caval-atrial junction. The PICC line is ready for immediate use. Electronically Signed   By: Sandi Mariscal M.D.   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 Based on limited sampling the patient's pathology reveals a grade 1 through 2 follicular lymphoma with a Ki-67 of 25%.  There was a lot of crush artifact in the sample and therefore a  high-grade process could not be ruled out. However the predominant element of his mass has grown significantly over the last 1 month which is concerning for a high-grade follicular lymphoma or follicular lymphoma with transformation to large B-cell lymphoma.   CT neck imaging shows concern with compression of left internal jugular vein with mild mass-effect on the airway in the neck and the left carotid is inseparable from the mass.   High risk of syncopal episodes if there is compression of the common carotid baroreceptors.  -PET scan performed 08/10/2021- "1. Examination is positive for large FDG avid mass within the left neck extending from the level 2 nodal chain into the left side of the superior mediastinum compatible with metabolically active tumor. 2. There are 2 foci of increased uptake within the neck and body of pancreas lymphomatous involvement of the pancreas. 3. Subcentimeter FDG avid right inguinal lymph node also concerning for lymphoma. 4. Several foci of increased uptake within the axial skeleton compatible with malignancy."   2) HTN  3) Obstructive sleep apnea not on CPAP machine.  This is causing sleep issues and possible cognitive issues.  4) pulmonary emboli-likely related to his lymphoma plus obesity as risk factor plus possible some venous compression.  5) recurrent syncope-likely from either carotid arterial compression or vagal nerve compression.  CTA did not show any overt carotid stenosis at this time however they can be positional compression of the catheter.  PLAN: -Concerned that the primary tumor is pushing on his carotid artery in the neck and potentially causing vascular compression at the thoracic outlet. -PET/CT reviewed -Patient was scheduled to start outpatient chemoimmunotherapy with R-CHOP today but in light of his recurrent presyncopal/syncopal events I would recommend  first cycle of chemoimmunotherapy as inpatient.  I have contacted the oncology unit  to notify them of the need for chemotherapy as soon as possible.  They currently do not have any beds on the floor but will try to move him to that unit if they are able.  I have also sent an email notifying the pharmacy and nursing of need for chemotherapy administration. -He has a right PICC line in place.  Echocardiogram was performed on 08/01/2021 and LVEF is 60 to 65%. -Continue Lovenox twice daily for PE.   -Continue allopurinol due to high risk for tumor lysis syndrome. -Would recommend some permissive hypertension up to systolics of [id number] to avoid syncopal episodes with vagal compression.  -Patient was recommended to make sure he stays well-hydrated since hypovolemia will worsen potential risk for syncope.   Future Appointments  Date Time Provider Century  08/18/2021 10:45 AM [facility] None  08/27/2021 10:00 AM [facility] None  08/27/2021 10:40 AM Brunetta Genera, MD [facility] None      LOS: 0 days   Mikey Bussing, DNP, AGPCNP-BC, AOCNP 08/16/21   ADDENDUM  .Patient was Personally and independently interviewed, examined and relevant elements of the history of present illness were reviewed in details and an assessment and plan was created. All elements of the patient's history of present illness , assessment and plan were discussed in details with Mikey Bussing, DNP, AGPCNP-BC, AOCNP. The above documentation reflects our combined findings assessment and plan. Chemotherapy orders reviewed and signed. Labs and PET CT scan reviewed with the patient in the emergency room. His left neck mass has grown further since his last clinic visit. Recommended he drink least 2 L of water a day. Initiation of for cycle of R-CHOP as soon as possible.  Chemoimmunotherapy orders have been reviewed and signed.  We will likely need Granix if still inpatient and cannot get Udenyca/Neulasta. Avoid excessive sedation due to concern for severe sleep  apnea that could worsen with sedation. Recommended patient not use any clothing with tight collars or sleep on the mass to avoid compression of the catheter or vagus. Eliquis switched to Lovenox due to concern of his weight possible increased risk of Eliquis failure meant of his PE.  PICC line placed already.  Sullivan Lone MD MS

## 2021-08-17 ENCOUNTER — Encounter: Payer: Self-pay | Admitting: Hematology

## 2021-08-17 DIAGNOSIS — C8221 Follicular lymphoma grade III, unspecified, lymph nodes of head, face, and neck: Secondary | ICD-10-CM | POA: Diagnosis not present

## 2021-08-17 DIAGNOSIS — R55 Syncope and collapse: Secondary | ICD-10-CM | POA: Diagnosis not present

## 2021-08-17 LAB — CBC WITH DIFFERENTIAL/PLATELET
Abs Immature Granulocytes: 0.05 10*3/uL (ref 0.00–0.07)
Basophils Absolute: 0 10*3/uL (ref 0.0–0.1)
Basophils Relative: 0 %
Eosinophils Absolute: 0.3 10*3/uL (ref 0.0–0.5)
Eosinophils Relative: 3 %
HCT: 41.4 % (ref 39.0–52.0)
Hemoglobin: 13.8 g/dL (ref 13.0–17.0)
Immature Granulocytes: 1 %
Lymphocytes Relative: 16 %
Lymphs Abs: 1.7 10*3/uL (ref 0.7–4.0)
MCH: 30.5 pg (ref 26.0–34.0)
MCHC: 33.3 g/dL (ref 30.0–36.0)
MCV: 91.6 fL (ref 80.0–100.0)
Monocytes Absolute: 0.8 10*3/uL (ref 0.1–1.0)
Monocytes Relative: 7 %
Neutro Abs: 8 10*3/uL — ABNORMAL HIGH (ref 1.7–7.7)
Neutrophils Relative %: 73 %
Platelets: 248 10*3/uL (ref 150–400)
RBC: 4.52 MIL/uL (ref 4.22–5.81)
RDW: 15 % (ref 11.5–15.5)
WBC: 10.9 10*3/uL — ABNORMAL HIGH (ref 4.0–10.5)
nRBC: 0 % (ref 0.0–0.2)

## 2021-08-17 LAB — URIC ACID: Uric Acid, Serum: 4.6 mg/dL (ref 3.7–8.6)

## 2021-08-17 MED ORDER — PALONOSETRON HCL INJECTION 0.25 MG/5ML
0.2500 mg | Freq: Once | INTRAVENOUS | Status: AC
  Administered 2021-08-18: 0.25 mg via INTRAVENOUS
  Filled 2021-08-17: qty 5

## 2021-08-17 MED ORDER — DOXORUBICIN HCL CHEMO IV INJECTION 2 MG/ML
50.0000 mg/m2 | Freq: Once | INTRAVENOUS | Status: AC
  Administered 2021-08-18: 134 mg via INTRAVENOUS
  Filled 2021-08-17: qty 67

## 2021-08-17 MED ORDER — SODIUM CHLORIDE 0.9 % IV SOLN
Freq: Once | INTRAVENOUS | Status: AC
Start: 2021-08-18 — End: 2021-08-18

## 2021-08-17 MED ORDER — SODIUM CHLORIDE 0.9 % IV SOLN
750.0000 mg/m2 | Freq: Once | INTRAVENOUS | Status: AC
  Administered 2021-08-18: 2000 mg via INTRAVENOUS
  Filled 2021-08-17: qty 100

## 2021-08-17 MED ORDER — CHLORHEXIDINE GLUCONATE CLOTH 2 % EX PADS
6.0000 | MEDICATED_PAD | Freq: Every day | CUTANEOUS | Status: DC
  Administered 2021-08-17 – 2021-08-25 (×9): 6 via TOPICAL

## 2021-08-17 MED ORDER — VINCRISTINE SULFATE CHEMO INJECTION 1 MG/ML
2.0000 mg | Freq: Once | INTRAVENOUS | Status: AC
  Administered 2021-08-18: 2 mg via INTRAVENOUS
  Filled 2021-08-17: qty 2

## 2021-08-17 MED ORDER — SODIUM CHLORIDE 0.9 % IV SOLN
10.0000 mg | Freq: Once | INTRAVENOUS | Status: AC
  Administered 2021-08-18: 10 mg via INTRAVENOUS
  Filled 2021-08-17: qty 1

## 2021-08-17 MED ORDER — SODIUM CHLORIDE 0.9 % IV SOLN
150.0000 mg | Freq: Once | INTRAVENOUS | Status: AC
  Administered 2021-08-18: 150 mg via INTRAVENOUS
  Filled 2021-08-17: qty 5

## 2021-08-17 NOTE — ED Notes (Signed)
Pt care taken, pt is resting, even chest rise and fall, audible breathing.

## 2021-08-17 NOTE — Progress Notes (Signed)
PROGRESS NOTE  TAHJ [name]    DOB: Jul 21, 1956, 65 y.o.  IPJ:[id number]  PCP: Celene Squibb, MD   Code Status: Full Code   DOA: 08/15/2021   LOS: 1  Brief Narrative of Current Hospitalization  Joe [name] is a 65 y.o. male with a PMH significant for HTN, obesity, OSA, follicular lymphoma, recent history of pulmonary embolism on Eliquis, recurrent syncope. They presented from home to the ED on 08/15/2021 with syncope occurring frequently. In the ED, it was found that they had no recurrent PE, no carotid occlusion but had significant increase in size of neck mass which could be contributing to compression of common carotid baroreceptors.  Heme/onc was consulted. Patient was admitted to medicine service for further workup and management of syncope as outlined in detail below.  08/17/21 -stable  Assessment & Plan  Principal Problem:   Recurrent syncope Active Problems:   Pulmonary embolism ([facility])   Follicular lymphoma ([facility])   Essential hypertension   OSA (obstructive sleep apnea)   Obesity, Class III, BMI 40-49.9 (morbid obesity) ([facility])   Syncope  Syncope-thought to be related to significant increase in size of follicular lymphoma, mass-effect on common carotid baroreceptors -Continuous cardiac telemetry  Follicular lymphoma- treatment has been delayed due to no bed availability in hospital. Patient has apparent airway obstruction while sleeping today but also has OSA in history which is untreated so unclear how worsened from baseline in given acute growth. No desats on monitor.  -Hematology following, appreciate recommendations -Looks like plan is to start treatment while inpatient with hopes that this will treat the primary presenting symptom of syncope -Continue allopurinol -CMP a.m. - consider ABG am if having grogginess while awake.  Recent history of PE -Heme/onc recommended twice daily Lovenox over Eliquis  HTN -Continue home amlodipine  OSA-  clear obstruction with  stertor and uneasy sleep on exam today. -recommend CPAP at night, especially considering other factors contributing to oral obstruction. RT feels he would not be able to tolerate. Continue to monitor - consider am ABG  HLD- Continue home simvastatin  Depression-chronic, stable -Continue home sertraline  DVT prophylaxis: SCDs Start: 08/16/21 0400   Diet:  Diet Orders (From admission, onward)     Start     Ordered   08/15/21 2015  DIET - DYS 1 Room service appropriate? Yes; Fluid consistency: Thin  Diet effective now       Question Answer Comment  Room service appropriate? Yes   Fluid consistency: Thin      08/15/21 2014            Subjective 08/17/21    Pt sleeping restlessly today on exam. Did not arouse to voice. Snoring loudly. Vital signs stable.   Disposition Plan & Communication  Patient status: Inpatient Admitted From: Home Disposition: Home Anticipated discharge date: To be determined  Family Communication: Niece Consults, Procedures, Significant Events  Consultants:  Hematology oncology  Procedures/significant events:  Initiating chemotherapy  Objective   Vitals:   08/17/21 0400 08/17/21 0500 08/17/21 0600 08/17/21 0700  BP: (!) 154/81   (!) 148/78  Pulse: 71 97 71 71  Resp: 17 20 17  (!) 26  Temp:      TempSrc:      SpO2: 91% 98% 95% 98%  Weight:      Height:       No intake or output data in the 24 hours ending 08/17/21 0750 Filed Weights   08/15/21 1332  Weight: (!) 144.7 kg  Patient BMI: Body mass index is 47.11 kg/m.   Physical Exam: General: asleep, snoring loudly. NAD HEENT: atraumatic, clear conjunctiva, anicteric sclera, moist mucus membranes, hearing grossly normal.  Positive for positive mass left submandibular area Respiratory: stertor present.  Cardiovascular: normal S1/S2,  RRR, no JVD, murmurs, rubs, gallops, quick capillary refill  Nervous: asleep Extremities: moves all equally, trace LE edema, normal tone Skin: dry,  intact, normal temperature, normal color, No rashes, lesions or ulcers  Labs   I have personally reviewed following labs and imaging studies Admission on 08/15/2021  Component Date Value Ref Range Status   WBC 08/15/2021 14.3 (A)  4.0 - 10.5 K/uL Final   RBC 08/15/2021 4.79  4.22 - 5.81 MIL/uL Final   Hemoglobin 08/15/2021 14.9  13.0 - 17.0 g/dL Final   HCT 08/15/2021 44.4  39.0 - 52.0 % Final   MCV 08/15/2021 92.7  80.0 - 100.0 fL Final   MCH 08/15/2021 31.1  26.0 - 34.0 pg Final   MCHC 08/15/2021 33.6  30.0 - 36.0 g/dL Final   RDW 08/15/2021 15.2  11.5 - 15.5 % Final   Platelets 08/15/2021 300  150 - 400 K/uL Final   nRBC 08/15/2021 0.0  0.0 - 0.2 % Final   Neutrophils Relative % 08/15/2021 83  % Final   Neutro Abs 08/15/2021 11.7 (A)  1.7 - 7.7 K/uL Final   Lymphocytes Relative 08/15/2021 10  % Final   Lymphs Abs 08/15/2021 1.4  0.7 - 4.0 K/uL Final   Monocytes Relative 08/15/2021 6  % Final   Monocytes Absolute 08/15/2021 0.9  0.1 - 1.0 K/uL Final   Eosinophils Relative 08/15/2021 1  % Final   Eosinophils Absolute 08/15/2021 0.2  0.0 - 0.5 K/uL Final   Basophils Relative 08/15/2021 0  % Final   Basophils Absolute 08/15/2021 0.1  0.0 - 0.1 K/uL Final   Immature Granulocytes 08/15/2021 0  % Final   Abs Immature Granulocytes 08/15/2021 0.06  0.00 - 0.07 K/uL Final   Sodium 08/15/2021 136  135 - 145 mmol/L Final   Potassium 08/15/2021 4.2  3.5 - 5.1 mmol/L Final   Chloride 08/15/2021 98  98 - 111 mmol/L Final   CO2 08/15/2021 31  22 - 32 mmol/L Final   Glucose, Bld 08/15/2021 129 (A)  70 - 99 mg/dL Final   BUN 08/15/2021 22  8 - 23 mg/dL Final   Creatinine, Ser 08/15/2021 1.17  0.61 - 1.24 mg/dL Final   Calcium 08/15/2021 8.8 (A)  8.9 - 10.3 mg/dL Final   Total Protein 08/15/2021 7.4  6.5 - 8.1 g/dL Final   Albumin 08/15/2021 3.7  3.5 - 5.0 g/dL Final   AST 08/15/2021 27  15 - 41 U/L Final   ALT 08/15/2021 46 (A)  0 - 44 U/L Final   Alkaline Phosphatase 08/15/2021 65  38 - 126  U/L Final   Total Bilirubin 08/15/2021 0.6  0.3 - 1.2 mg/dL Final   GFR, Estimated 08/15/2021 >60  >60 mL/min Final   Anion gap 08/15/2021 7  5 - 15 Final   B Natriuretic Peptide 08/15/2021 25.7  0.0 - 100.0 pg/mL Final   SARS Coronavirus 2 by RT PCR 08/15/2021 NEGATIVE  NEGATIVE Final   Influenza A by PCR 08/15/2021 NEGATIVE  NEGATIVE Final   Influenza B by PCR 08/15/2021 NEGATIVE  NEGATIVE Final   WBC 08/16/2021 13.7 (A)  4.0 - 10.5 K/uL Final   RBC 08/16/2021 4.53  4.22 - 5.81 MIL/uL Final   Hemoglobin  08/16/2021 13.7  13.0 - 17.0 g/dL Final   HCT 08/16/2021 41.5  39.0 - 52.0 % Final   MCV 08/16/2021 91.6  80.0 - 100.0 fL Final   MCH 08/16/2021 30.2  26.0 - 34.0 pg Final   MCHC 08/16/2021 33.0  30.0 - 36.0 g/dL Final   RDW 08/16/2021 15.0  11.5 - 15.5 % Final   Platelets 08/16/2021 264  150 - 400 K/uL Final   nRBC 08/16/2021 0.0  0.0 - 0.2 % Final   Neutrophils Relative % 08/16/2021 75  % Final   Neutro Abs 08/16/2021 10.2 (A)  1.7 - 7.7 K/uL Final   Lymphocytes Relative 08/16/2021 18  % Final   Lymphs Abs 08/16/2021 2.4  0.7 - 4.0 K/uL Final   Monocytes Relative 08/16/2021 6  % Final   Monocytes Absolute 08/16/2021 0.9  0.1 - 1.0 K/uL Final   Eosinophils Relative 08/16/2021 1  % Final   Eosinophils Absolute 08/16/2021 0.2  0.0 - 0.5 K/uL Final   Basophils Relative 08/16/2021 0  % Final   Basophils Absolute 08/16/2021 0.0  0.0 - 0.1 K/uL Final   Immature Granulocytes 08/16/2021 0  % Final   Abs Immature Granulocytes 08/16/2021 0.04  0.00 - 0.07 K/uL Final   Sodium 08/16/2021 137  135 - 145 mmol/L Final   Potassium 08/16/2021 3.8  3.5 - 5.1 mmol/L Final   Chloride 08/16/2021 102  98 - 111 mmol/L Final   CO2 08/16/2021 28  22 - 32 mmol/L Final   Glucose, Bld 08/16/2021 88  70 - 99 mg/dL Final   BUN 08/16/2021 16  8 - 23 mg/dL Final   Creatinine, Ser 08/16/2021 0.90  0.61 - 1.24 mg/dL Final   Calcium 08/16/2021 8.4 (A)  8.9 - 10.3 mg/dL Final   Total Protein 08/16/2021 6.9  6.5  - 8.1 g/dL Final   Albumin 08/16/2021 3.4 (A)  3.5 - 5.0 g/dL Final   AST 08/16/2021 27  15 - 41 U/L Final   ALT 08/16/2021 47 (A)  0 - 44 U/L Final   Alkaline Phosphatase 08/16/2021 57  38 - 126 U/L Final   Total Bilirubin 08/16/2021 0.8  0.3 - 1.2 mg/dL Final   GFR, Estimated 08/16/2021 >60  >60 mL/min Final   Anion gap 08/16/2021 7  5 - 15 Final   Magnesium 08/16/2021 2.0  1.7 - 2.4 mg/dL Final   Uric Acid, Serum 08/16/2021 5.1  3.7 - 8.6 mg/dL Final   WBC 08/17/2021 10.9 (A)  4.0 - 10.5 K/uL Final   RBC 08/17/2021 4.52  4.22 - 5.81 MIL/uL Final   Hemoglobin 08/17/2021 13.8  13.0 - 17.0 g/dL Final   HCT 08/17/2021 41.4  39.0 - 52.0 % Final   MCV 08/17/2021 91.6  80.0 - 100.0 fL Final   MCH 08/17/2021 30.5  26.0 - 34.0 pg Final   MCHC 08/17/2021 33.3  30.0 - 36.0 g/dL Final   RDW 08/17/2021 15.0  11.5 - 15.5 % Final   Platelets 08/17/2021 248  150 - 400 K/uL Final   nRBC 08/17/2021 0.0  0.0 - 0.2 % Final   Neutrophils Relative % 08/17/2021 73  % Final   Neutro Abs 08/17/2021 8.0 (A)  1.7 - 7.7 K/uL Final   Lymphocytes Relative 08/17/2021 16  % Final   Lymphs Abs 08/17/2021 1.7  0.7 - 4.0 K/uL Final   Monocytes Relative 08/17/2021 7  % Final   Monocytes Absolute 08/17/2021 0.8  0.1 - 1.0 K/uL Final  Eosinophils Relative 08/17/2021 3  % Final   Eosinophils Absolute 08/17/2021 0.3  0.0 - 0.5 K/uL Final   Basophils Relative 08/17/2021 0  % Final   Basophils Absolute 08/17/2021 0.0  0.0 - 0.1 K/uL Final   Immature Granulocytes 08/17/2021 1  % Final   Abs Immature Granulocytes 08/17/2021 0.05  0.00 - 0.07 K/uL Final   Uric Acid, Serum 08/17/2021 4.6  3.7 - 8.6 mg/dL Final    Imaging Studies  No results found. Medications   Scheduled Meds:  allopurinol  300 mg Oral Daily   amLODipine  10 mg Oral Daily   enoxaparin (LOVENOX) injection  1 mg/kg Subcutaneous Q12H   sertraline  50 mg Oral Daily   simvastatin  40 mg Oral Once per day on Mon Wed Fri   No recently discontinued  medications to reconcile  LOS: 1 day   Time spent: >47min  Deitrich Steve L Heru Montz, DO [facility] 08/17/2021, 7:50 AM   Please refer to amion to contact the [facility] Attending or Consulting provider for this pt  [email or web address] Available by Epic secure chat 7AM-7PM. If 7PM-7AM, please contact night-coverage

## 2021-08-17 NOTE — Progress Notes (Addendum)
HEMATOLOGY-ONCOLOGY PROGRESS NOTE  SUBJECTIVE: Joe [name] is still in the emergency department today.  He is awaiting bed placement.  No recurrent syncopal episodes.  He still has pain to his left neck mass.  Denies difficulty swallowing.  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  PHYSICAL EXAMINATION: ECOG PERFORMANCE STATUS: 1 - Symptomatic but completely ambulatory  Vitals:   08/17/21 0700 08/17/21 1040  BP: (!) 148/78 133/63  Pulse: 71 (!) 59  Resp: (!) 26 (!) 21  Temp:    SpO2: 98% 93%   Filed Weights   08/15/21 1332  Weight: (!) 144.7 kg    Intake/Output from previous day: No intake/output data recorded.  GENERAL:alert, no distress and comfortable SKIN: skin color, texture, turgor are normal, no rashes or significant lesions EYES: normal, Conjunctiva are pink and non-injected, sclera clear OROPHARYNX:no exudate, no erythema and lips, buccal mucosa, and tongue normal  LYMPH: Large left neck palpable lymphadenopathy, smaller right neck lymphadenopathy  LUNGS: clear to auscultation and percussion with normal breathing effort HEART: regular rate & rhythm and no murmurs and no lower extremity edema ABDOMEN:abdomen soft, non-tender and normal bowel sounds NEURO: alert & oriented x 3 with fluent speech, no focal motor/sensory deficits  LABORATORY DATA:  I have reviewed the data as listed CMP Latest Ref Rng & Units 08/16/2021 08/15/2021 08/09/2021  Glucose 70 - 99 mg/dL 88 129(H) 95  BUN 8 - 23 mg/dL 16 22 26(H)  Creatinine 0.61 - 1.24 mg/dL 0.90 1.17 1.17  Sodium 135 - 145 mmol/L 137 136 138  Potassium 3.5 - 5.1 mmol/L 3.8 4.2 3.9  Chloride 98 - 111 mmol/L 102 98 102  CO2 22 - 32 mmol/L '28 31 25  ' Calcium 8.9 - 10.3  mg/dL 8.4(L) 8.8(L) 9.5  Total Protein 6.5 - 8.1 g/dL 6.9 7.4 7.6  Total Bilirubin 0.3 - 1.2 mg/dL 0.8 0.6 0.5  Alkaline Phos 38 - 126 U/L 57 65 80  AST 15 - 41 U/L '27 27 20  ' ALT 0 - 44 U/L 47(H) 46(H) 43    Lab Results  Component Value Date   WBC 10.9 (H) 08/17/2021   HGB 13.8 08/17/2021   HCT 41.4 08/17/2021   MCV 91.6 08/17/2021   PLT 248 08/17/2021   NEUTROABS 8.0 (H) 08/17/2021    CT Angio Head W or Wo Contrast  Result Date: 08/15/2021 CLINICAL DATA:  Syncope and hypoxia, large neck mass due to non-Hodgkin's lymphoma EXAM: CT ANGIOGRAPHY HEAD AND NECK TECHNIQUE: Multidetector CT imaging of the head and neck was performed using the standard protocol during bolus administration of intravenous contrast. Multiplanar CT image reconstructions and MIPs were obtained to evaluate the vascular anatomy. Carotid stenosis measurements (when applicable) are obtained utilizing NASCET criteria, using the distal internal carotid diameter as the denominator. CONTRAST:  149m OMNIPAQUE IOHEXOL 350 MG/ML SOLN COMPARISON:  CT neck 07/09/2021 FINDINGS: CT HEAD FINDINGS Brain: There is no acute intracranial hemorrhage, extra-axial fluid collection, or acute infarct. The ventricles are normal in size. There is no mass lesion. There is no midline shift. Vascular: No hyperdense vessel or unexpected calcification. The vasculature is assessed info below Skull: Normal. Negative for fracture or focal lesion. Sinuses: There is moderate mucosal thickening with  layering fluid in the right maxillary sinus. There is surrounding hyperostosis consistent with chronic maxillary sinusitis. Orbits: The globes and orbits are unremarkable. Review of the MIP images confirms the above findings CTA NECK FINDINGS Aortic arch: The aortic arch is unremarkable. Right carotid system: The right common, internal, and external carotid arteries are patent with no significant stenosis, dissection, or aneurysm. Left carotid system: The proximal  left common carotid artery is unremarkable. There is complete encasement of the common carotid artery from the level of the clavicular head through the bifurcation with complete encasement of the internal and external carotid arteries. The internal carotid artery is encased from the bifurcation through the petrous segment. The distal branches of the external carotid artery are not encased. There is no significant narrowing of the left carotid system. There is no evidence of dissection or aneurysm. There is minimal calcified atherosclerotic plaque in the carotid bulb. Vertebral arteries: The vertebral arteries are patent, with no significant stenosis, dissection, or aneurysm. Skeleton: There is multilevel degenerative change of the cervical spine. There is no acute osseous abnormality or aggressive osseous lesion. Other neck: There is a large infiltrative soft tissue mass throughout the soft tissues of the left neck. The mass measures up to approximately 8.9 cm AP by 9.3 cm TV by 13.8 cm cc, increased in size since 07/09/2021 when it measured approximately 6.6 cm TV by 6.0 cm AP by 10.0 cm cc, measured again using similar technique. Inferiorly, the mass extends into the upper mediastinum just posterior to the clavicle. The mass is inseparable from the sternocleidomastoid. As above, there is complete encasement of the carotid vasculature without narrowing of the arteries. The airway is shifted to the right and moderately narrowed at the level of the oropharynx. Upper chest: The lungs are assessed on the separately dictated CT chest. Review of the MIP images confirms the above findings CTA HEAD FINDINGS Anterior circulation: The bilateral intracranial ICAs are patent. The bilateral MCAs are patent The bilateral ACAs are patent. There is no aneurysm. Posterior circulation: The bilateral V4 segments are patent. The basilar artery is patent. The bilateral PCAs are patent. The posterior communicating arteries are not  definitely identified. Venous sinuses: There is a hypodense filling defect in the right sigmoid sinus. Anatomic variants: As above. Review of the MIP images confirms the above findings IMPRESSION: Since 07/09/2021: 1. Significant interval increase in size of the large mass in the left neck which extends from the skull base superiorly into the upper mediastinum inferiorly. The mass completely encases most of the left common carotid artery, the entire left internal carotid artery from the bifurcation to the skull base, and much of the left external carotid artery and branches. Despite the encasement, there is no significant narrowing of the left carotid system. The mass results in significant mass effect on surrounding structures and moderate narrowing of the oropharyngeal airway. 2. Otherwise, unremarkable vasculature of the head and neck. 3. Hypodense filling defect in the right sigmoid sinus concerning for nonocclusive thrombus. 4. Chronic right maxillary sinusitis. Electronically Signed   By: Valetta Mole M.D.   On: 08/15/2021 19:24   DG Chest 2 View  Result Date: 08/15/2021 CLINICAL DATA:  Hypoxia.  Lymphoma. EXAM: CHEST - 2 VIEW COMPARISON:  07/31/2021 FINDINGS: Right arm PICC tip in the SVC 4 cm above the right atrium. Heart size is normal. The lungs are clear. No effusions. Left neck mass demonstrated with displacement of the trachea towards the right. IMPRESSION: No active cardiopulmonary disease. PICC tip  in the SVC 4 cm above the right atrium. Known left neck mass. Electronically Signed   By: Nelson Chimes M.D.   On: 08/15/2021 14:43   DG Chest 2 View  Result Date: 07/28/2021 CLINICAL DATA:  Seizure-like activity. EXAM: CHEST - 2 VIEW COMPARISON:  June 08, 2019 FINDINGS: The heart size and mediastinal contours are within normal limits. Both lungs are clear. The visualized skeletal structures are unremarkable. IMPRESSION: No active cardiopulmonary disease. Electronically Signed   By: Fidela Salisbury M.D.   On: 07/28/2021 14:51   CT Head Wo Contrast  Result Date: 07/28/2021 CLINICAL DATA:  Seizure activity, frontal scalp abrasion EXAM: CT HEAD WITHOUT CONTRAST TECHNIQUE: Contiguous axial images were obtained from the base of the skull through the vertex without intravenous contrast. COMPARISON:  None. FINDINGS: Brain: No acute infarct or hemorrhage. Lateral ventricles and midline structures are unremarkable. No acute extra-axial fluid collections. No mass effect. Vascular: No hyperdense vessel or unexpected calcification. Skull: Normal. Negative for fracture or focal lesion. Sinuses/Orbits: Mild mucosal thickening within the maxillary sinuses. Remaining paranasal sinuses are clear. Other: None. IMPRESSION: 1. No acute intracranial process. Electronically Signed   By: Randa Ngo M.D.   On: 07/28/2021 18:39   CT Angio Neck W and/or Wo Contrast  Result Date: 08/15/2021 CLINICAL DATA:  Syncope and hypoxia, large neck mass due to non-Hodgkin's lymphoma EXAM: CT ANGIOGRAPHY HEAD AND NECK TECHNIQUE: Multidetector CT imaging of the head and neck was performed using the standard protocol during bolus administration of intravenous contrast. Multiplanar CT image reconstructions and MIPs were obtained to evaluate the vascular anatomy. Carotid stenosis measurements (when applicable) are obtained utilizing NASCET criteria, using the distal internal carotid diameter as the denominator. CONTRAST:  141m OMNIPAQUE IOHEXOL 350 MG/ML SOLN COMPARISON:  CT neck 07/09/2021 FINDINGS: CT HEAD FINDINGS Brain: There is no acute intracranial hemorrhage, extra-axial fluid collection, or acute infarct. The ventricles are normal in size. There is no mass lesion. There is no midline shift. Vascular: No hyperdense vessel or unexpected calcification. The vasculature is assessed info below Skull: Normal. Negative for fracture or focal lesion. Sinuses: There is moderate mucosal thickening with layering fluid in the right  maxillary sinus. There is surrounding hyperostosis consistent with chronic maxillary sinusitis. Orbits: The globes and orbits are unremarkable. Review of the MIP images confirms the above findings CTA NECK FINDINGS Aortic arch: The aortic arch is unremarkable. Right carotid system: The right common, internal, and external carotid arteries are patent with no significant stenosis, dissection, or aneurysm. Left carotid system: The proximal left common carotid artery is unremarkable. There is complete encasement of the common carotid artery from the level of the clavicular head through the bifurcation with complete encasement of the internal and external carotid arteries. The internal carotid artery is encased from the bifurcation through the petrous segment. The distal branches of the external carotid artery are not encased. There is no significant narrowing of the left carotid system. There is no evidence of dissection or aneurysm. There is minimal calcified atherosclerotic plaque in the carotid bulb. Vertebral arteries: The vertebral arteries are patent, with no significant stenosis, dissection, or aneurysm. Skeleton: There is multilevel degenerative change of the cervical spine. There is no acute osseous abnormality or aggressive osseous lesion. Other neck: There is a large infiltrative soft tissue mass throughout the soft tissues of the left neck. The mass measures up to approximately 8.9 cm AP by 9.3 cm TV by 13.8 cm cc, increased in size since 07/09/2021 when it measured  approximately 6.6 cm TV by 6.0 cm AP by 10.0 cm cc, measured again using similar technique. Inferiorly, the mass extends into the upper mediastinum just posterior to the clavicle. The mass is inseparable from the sternocleidomastoid. As above, there is complete encasement of the carotid vasculature without narrowing of the arteries. The airway is shifted to the right and moderately narrowed at the level of the oropharynx. Upper chest: The lungs  are assessed on the separately dictated CT chest. Review of the MIP images confirms the above findings CTA HEAD FINDINGS Anterior circulation: The bilateral intracranial ICAs are patent. The bilateral MCAs are patent The bilateral ACAs are patent. There is no aneurysm. Posterior circulation: The bilateral V4 segments are patent. The basilar artery is patent. The bilateral PCAs are patent. The posterior communicating arteries are not definitely identified. Venous sinuses: There is a hypodense filling defect in the right sigmoid sinus. Anatomic variants: As above. Review of the MIP images confirms the above findings IMPRESSION: Since 07/09/2021: 1. Significant interval increase in size of the large mass in the left neck which extends from the skull base superiorly into the upper mediastinum inferiorly. The mass completely encases most of the left common carotid artery, the entire left internal carotid artery from the bifurcation to the skull base, and much of the left external carotid artery and branches. Despite the encasement, there is no significant narrowing of the left carotid system. The mass results in significant mass effect on surrounding structures and moderate narrowing of the oropharyngeal airway. 2. Otherwise, unremarkable vasculature of the head and neck. 3. Hypodense filling defect in the right sigmoid sinus concerning for nonocclusive thrombus. 4. Chronic right maxillary sinusitis. Electronically Signed   By: Valetta Mole M.D.   On: 08/15/2021 19:24   CT Angio Chest PE W and/or Wo Contrast  Result Date: 08/15/2021 CLINICAL DATA:  Concern for pulmonary embolism. EXAM: CT ANGIOGRAPHY CHEST WITH CONTRAST TECHNIQUE: Multidetector CT imaging of the chest was performed using the standard protocol during bolus administration of intravenous contrast. Multiplanar CT image reconstructions and MIPs were obtained to evaluate the vascular anatomy. CONTRAST:  177m OMNIPAQUE IOHEXOL 350 MG/ML SOLN COMPARISON:   Chest CT dated 07/31/2021. FINDINGS: Cardiovascular: There is no cardiomegaly or pericardial effusion. The thoracic aorta is unremarkable. Evaluation of the pulmonary arteries is limited due to respiratory motion artifact and suboptimal visualization of the peripheral branches. No large or central pulmonary artery embolus identified. Right sided PICC now extends superiorly into the right IJ. The PICC appears to bend in the right IJ and turns downward. The tip of the PICC appears to be in the right IJ at the level of the subclavian vein (33/6 and coronal 91/7). Evaluation however is limited due to presence of intravenous contrast. Mediastinum/Nodes: No hilar or mediastinal adenopathy. The esophagus is grossly unremarkable. Partially visualized mass in the left side of the neck measures 9.1 x 8.4 cm and increase in size since the study of 07/09/2021 (previously measuring approximately 7.1 x 6.0 cm). There is associated mass effect and deviation of the trachea to the right. No mediastinal fluid collection. Lungs/Pleura: No focal consolidation, pleural effusion, or pneumothorax. The central airways are patent. Upper Abdomen: No acute abnormality. Musculoskeletal: Degenerative changes of the spine. No acute osseous pathology. Review of the MIP images confirms the above findings. IMPRESSION: 1. No CT evidence of central pulmonary artery embolus. 2. Right sided PICC now extends superiorly into the right IJ. The tip of the PICC appears to be in the right IJ  at the level of the subclavian vein. Evaluation however is limited due to presence of intravenous contrast. 3. Partially visualized mass in the left side of the neck has increased in size since the study of 07/09/2021 and is concerning for malignancy. These results were called by telephone at the time of interpretation on 08/15/2021 at 7:39 pm to Dr. Gilford Raid, who verbally acknowledged these results. Electronically Signed   By: Anner Crete M.D.   On: 08/15/2021  19:49   CT Angio Chest PE W and/or Wo Contrast  Addendum Date: 07/31/2021   ADDENDUM REPORT: 07/31/2021 19:50 ADDENDUM: These results were called by telephone at the time of interpretation on 07/31/2021 at 7:48 pm to provider JOSEPH ZAMMIT , who verbally acknowledged these results. Electronically Signed   By: Iven Finn M.D.   On: 07/31/2021 19:50   Result Date: 07/31/2021 CLINICAL DATA:  PE suspected, low/intermediate prob, neg D-dimer EXAM: CT ANGIOGRAPHY CHEST WITH CONTRAST TECHNIQUE: Multidetector CT imaging of the chest was performed using the standard protocol during bolus administration of intravenous contrast. Multiplanar CT image reconstructions and MIPs were obtained to evaluate the vascular anatomy. CONTRAST:  153m OMNIPAQUE IOHEXOL 350 MG/ML SOLN COMPARISON:  None. FINDINGS: Cardiovascular: Satisfactory opacification of the pulmonary arteries to the segmental level. Segmental and subsegmental right lower lobe pulmonary embolus. Segmental and subsegmental right middle lobe pulmonary embolus. Limited evaluation of left lower lobe with question subsegmental pulmonary emboli. Normal heart size. No pericardial effusion. Mediastinum/Nodes: No enlarged mediastinal, hilar, or axillary lymph nodes. Thyroid gland, trachea, and esophagus demonstrate no significant findings. Lungs/Pleura: Limited evaluation due to motion artifact. Expiratory phase of respiration. Mosaic attenuation of the lungs. No focal consolidation. No pulmonary nodule. No pulmonary mass. No pleural effusion. No pneumothorax. Upper Abdomen: Possible mild hepatic steatosis. Musculoskeletal: No chest wall abnormality. No suspicious lytic or blastic osseous lesions. No acute displaced fracture. Multilevel degenerative changes of the spine. Review of the MIP images confirms the above findings. IMPRESSION: Segmental and subsegmental right lower and middle lobe pulmonary emboli. Question left lower lobe subsegmental pulmonary emboli. No  findings of right heart strain or pulmonary infarction. Electronically Signed: By: MIven FinnM.D. 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 EXAM: NUCLEAR MEDICINE PET SKULL BASE TO THIGH TECHNIQUE: 16.9 mCi F-18 FDG was injected intravenously. Full-ring PET imaging was performed from the skull base to thigh after the radiotracer. CT data was obtained and used for attenuation correction and anatomic localization. Fasting blood glucose: 121 mg/dl COMPARISON:  None FINDINGS: Mediastinal blood pool activity: SUV max 3.78 Liver activity: SUV max 4.33 NECK: There is a large nodal mass within the left cervical chain extending from the level 2 region through level 4 and 5. This measures the 9.6 x 8.5 by 13.9 cm and has an SUV max 26.47, image 43/4. The tumor extends into the superior mediastinum along with the left paratracheal region, image 56/4. There is also tumor extension into the prevertebral soft tissue space at the level of the oropharynx with involvement of the right tonsillar fossa. Tumor also extends into the prevertebral soft tissues at the level of the hypopharynx with mass effect upon the hypopharynx with deviation towards the right. Incidental CT findings: none CHEST: Mild FDG uptake associated with 9 mm subcarinal lymph node within SUV max of 3.68, image 83/4. No FDG avid pulmonary nodule. Incidental CT findings: Mild aortic atherosclerosis and coronary  artery calcifications. No pleural effusion. ABDOMEN/PELVIS: No abnormal FDG uptake identified within the liver. Within the neck of pancreas there is a approximately 1.4 cm focus of increased uptake with SUV max of 7.88. Within the body of the pancreas there is a focus of increased uptake measuring 2 cm with SUV max of 14.0. Normal size spleen without focal areas of increased uptake. Adrenal glands appear normal. No FDG  avid lymph nodes within the abdomen or pelvis. FDG avid right inguinal node measures 7 mm within SUV max of 6.96, image 199/4. Incidental CT findings: Large fat containing umbilical hernia measures 8.0 x 6.3 cm. Small bilateral fat containing inguinal hernias. SKELETON: There are a few scattered FDG avid bone lesions, without corresponding CT changes. For example, within the left humeral head there is a moderate to intense focus of increased uptake within SUV max of 9.87. Within the transverse process the T10 vertebra on the left there is a focus of increased uptake within SUV max of 8.85. Within the inferior endplate of the L2 vertebral body there is a small focus of increased uptake with SUV max of 8.27. Incidental CT findings: none IMPRESSION: 1. Examination is positive for large FDG avid mass within the left neck extending from the level 2 nodal chain into the left side of the superior mediastinum compatible with metabolically active tumor. 2. There are 2 foci of increased uptake within the neck and body of pancreas lymphomatous involvement of the pancreas. 3. Subcentimeter FDG avid right inguinal lymph node also concerning for lymphoma. 4. Several foci of increased uptake within the axial skeleton compatible with malignancy. Electronically Signed   By: Kerby Moors M.D.   On: 08/14/2021 06:22   US Venous Img Lower Bilateral (DVT)  Result Date: 08/01/2021 CLINICAL DATA:  Pulmonary emboli on CTA chest 07/31/2021. Lower extremity edema. EXAM: Bilateral LOWER EXTREMITY VENOUS DOPPLER ULTRASOUND TECHNIQUE: Gray-scale sonography with compression, as well as color and duplex ultrasound, were performed to evaluate the deep venous system(s) from the level of the common femoral vein through the popliteal and proximal calf veins. COMPARISON:  None. FINDINGS: VENOUS Left side: Substantial thrombus in the superficial femoral vein and profundus femoral vein on the left, with no significant compressibility, partially  occlusive but not completely occlusive. This extends into the posterior tibial veins and peroneal veins as well as the popliteal vein. The common femoral vein appears patent. Right side: Normal appearance of the common femoral vein, superficial femoral vein, popliteal vein, and calf veins on the right. Expected compressibility and augmentation. OTHER None. Limitations: none IMPRESSION: 1. Positive for left-sided DVT with left superficial femoral vein, profundus femoral vein, popliteal vein, and calf vein thrombosis on the left side, in this patient with recent diagnosis of acute pulmonary embolus. No findings of DVT on the right side. Electronically Signed   By: Van Clines M.D.   On: 08/01/2021 15:50   DG Chest Port 1 View  Result Date: 07/31/2021 CLINICAL DATA:  Wheezing, history of COVID positive. EXAM: PORTABLE CHEST 1 VIEW COMPARISON:  July 28, 2021 FINDINGS: The heart size and mediastinal contours are within normal limits. Both lungs are clear. The visualized skeletal structures are unremarkable. IMPRESSION: No active disease. Electronically Signed   By: Virgina Norfolk M.D.   On: 07/31/2021 16:22   ECHOCARDIOGRAM COMPLETE  Result Date: 08/01/2021    ECHOCARDIOGRAM REPORT   Patient Name:   Joe [name] Date of Exam: 08/01/2021 Medical Rec #:  [id number]       Height:  69.0 in Accession #:    [id number]      Weight:       323.4 lb Date of Birth:  April 30, 1956       BSA:          2.534 m Patient Age:    58 years        BP:           149/82 mmHg Patient Gender: M               HR:           95 bpm. Exam Location:  [facility] Procedure: 2D Echo, Cardiac Doppler and Color Doppler Indications:    Pulmonary Embolus I26.09  History:        Patient has no prior history of Echocardiogram examinations.                 Risk Factors:Hypertension and Dyslipidemia. Obesity. OSA                 (obstructive sleep apnea). Covid-19 infection. Cancer ([place])                 (From Hx).  Sonographer:     Alvino Chapel RCS Referring Phys: [id number] [place]  1. Left ventricular ejection fraction, by estimation, is 60 to 65%. The left ventricle has normal function. The left ventricle has no regional wall motion abnormalities. Left ventricular diastolic parameters were normal.  2. Right ventricular systolic function is normal. The right ventricular size is normal. Tricuspid regurgitation signal is inadequate for assessing PA pressure.  3. The mitral valve is grossly normal. No evidence of mitral valve regurgitation. No evidence of mitral stenosis.  4. The aortic valve is tricuspid. Aortic valve regurgitation is not visualized. No aortic stenosis is present.  5. The inferior vena cava is normal in size with greater than 50% respiratory variability, suggesting right atrial pressure of 3 mmHg. Conclusion(s)/Recommendation(s): Normal biventricular function without evidence of hemodynamically significant valvular heart disease. FINDINGS  Left Ventricle: Left ventricular ejection fraction, by estimation, is 60 to 65%. The left ventricle has normal function. The left ventricle has no regional wall motion abnormalities. The left ventricular internal cavity size was normal in size. There is  no left ventricular hypertrophy. Left ventricular diastolic parameters were normal. Right Ventricle: The right ventricular size is normal. No increase in right ventricular wall thickness. Right ventricular systolic function is normal. Tricuspid regurgitation signal is inadequate for assessing PA pressure. Left Atrium: Left atrial size was normal in size. Right Atrium: Right atrial size was normal in size. Pericardium: Trivial pericardial effusion is present. Presence of pericardial fat pad. Mitral Valve: The mitral valve is grossly normal. No evidence of mitral valve regurgitation. No evidence of mitral valve stenosis. Tricuspid Valve: The tricuspid valve is grossly normal. Tricuspid valve regurgitation is trivial. No  evidence of tricuspid stenosis. Aortic Valve: The aortic valve is tricuspid. Aortic valve regurgitation is not visualized. No aortic stenosis is present. Aortic valve mean gradient measures 8.0 mmHg. Aortic valve peak gradient measures 16.5 mmHg. Aortic valve area, by VTI measures 2.63  cm. Pulmonic Valve: The pulmonic valve was grossly normal. Pulmonic valve regurgitation is not visualized. No evidence of pulmonic stenosis. Aorta: The aortic root is normal in size and structure. Venous: The right lower pulmonary vein is normal. The inferior vena cava is normal in size with greater than 50% respiratory variability, suggesting right atrial pressure of 3 mmHg. IAS/Shunts: The atrial septum is  grossly normal.  LEFT VENTRICLE PLAX 2D LVIDd:         4.50 cm   Diastology LVIDs:         2.70 cm   LV e' medial:    11.10 cm/s LV PW:         1.10 cm   LV E/e' medial:  7.6 LV IVS:        1.10 cm   LV e' lateral:   12.30 cm/s LVOT diam:     2.00 cm   LV E/e' lateral: 6.8 LV SV:         97 LV SV Index:   38 LVOT Area:     3.14 cm  RIGHT VENTRICLE RV S prime:     21.10 cm/s TAPSE (M-mode): 3.2 cm LEFT ATRIUM             Index        RIGHT ATRIUM           Index LA diam:        3.60 cm 1.42 cm/m   RA Area:     11.70 cm LA Vol (A2C):   33.8 ml 13.34 ml/m  RA Volume:   26.20 ml  10.34 ml/m LA Vol (A4C):   48.7 ml 19.22 ml/m LA Biplane Vol: 42.7 ml 16.85 ml/m  AORTIC VALVE AV Area (Vmax):    2.62 cm AV Area (Vmean):   2.82 cm AV Area (VTI):     2.63 cm AV Vmax:           203.00 cm/s AV Vmean:          135.000 cm/s AV VTI:            0.371 m AV Peak Grad:      16.5 mmHg AV Mean Grad:      8.0 mmHg LVOT Vmax:         169.00 cm/s LVOT Vmean:        121.000 cm/s LVOT VTI:          0.310 m LVOT/AV VTI ratio: 0.84  AORTA Ao Root diam: 3.40 cm MITRAL VALVE MV Area (PHT): 2.83 cm     SHUNTS MV Decel Time: 268 msec     Systemic VTI:  0.31 m MV E velocity: 83.90 cm/s   Systemic Diam: 2.00 cm MV A velocity: 112.00 cm/s MV E/A ratio:   0.75 Eleonore Chiquito MD Electronically signed by Eleonore Chiquito MD Signature Date/Time: 08/01/2021/4:20:57 PM    Final    IR PICC PLACEMENT RIGHT >5 YRS INC IMG GUIDE  Result Date: 08/14/2021 INDICATION: Recent diagnosis of lymphoma. Request made for placement of a PICC line for the initiation of chemotherapy Note, initial plan was to proceed with port a catheter placement however the patient has recently been diagnosed with DVT and pulmonary embolism and referring oncologist has requested a PICC line initially as the patient is begun on anticoagulation. EXAM: ULTRASOUND AND FLUOROSCOPIC GUIDED PICC LINE INSERTION MEDICATIONS: None. CONTRAST:  None FLUOROSCOPY TIME:  18 seconds (2 mGy) COMPLICATIONS: None immediate. TECHNIQUE: The procedure, risks, benefits, and alternatives were explained to the patient's niece and informed written consent was obtained. A timeout was performed prior to the initiation of the procedure. The right upper extremity was prepped with chlorhexidine in a sterile fashion, and a sterile drape was applied covering the operative field. Maximum barrier sterile technique with sterile gowns and gloves were used for the procedure. A timeout was performed prior to the initiation  of the procedure. Local anesthesia was provided with 1% lidocaine. Under direct ultrasound guidance, the brachial vein was accessed with a micropuncture kit after the overlying soft tissues were anesthetized with 1% lidocaine. Real-time ultrasound guidance was utilized for vascular access including the acquisition of a permanent ultrasound image documenting patency of the accessed vessel. A guidewire was advanced to the level of the superior caval-atrial junction for measurement purposes and the PICC line was cut to length. A peel-away sheath was placed and a 40 cm, 5 [place], dual lumen was inserted to level of the superior caval-atrial junction. A post procedure spot fluoroscopic was obtained. The catheter easily  aspirated and flushed and was secured in place with stat lock device. A dressing was applied. The patient tolerated the procedure well without immediate post procedural complication. FINDINGS: After catheter placement, the tip lies within the superior cavoatrial junction. The catheter aspirates and flushes normally and is ready for immediate use. IMPRESSION: Successful ultrasound and fluoroscopic guided placement of a right brachial vein approach, 40 cm, 5 French, dual lumen PICC with tip at the superior caval-atrial junction. The PICC line is ready for immediate use. Electronically Signed   By: Sandi Mariscal M.D.   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 Based on limited sampling the patient's pathology reveals a grade 1 through 2 follicular lymphoma with a Ki-67 of 25%.  There was a lot of crush artifact in the sample and therefore a high-grade process could not be ruled out. However the predominant element of his mass has grown significantly over the last 1 month which is concerning for a high-grade follicular lymphoma or follicular lymphoma with transformation to large B-cell lymphoma.   CT neck imaging shows concern with compression of left internal jugular vein with mild mass-effect on the airway in the neck and the left carotid is inseparable from the mass.   High risk of syncopal episodes if there is compression of the common carotid baroreceptors.  -PET scan performed 08/10/2021- "1. Examination is positive for large FDG avid mass within the left neck extending from the level 2 nodal chain into the left side of the superior mediastinum compatible with metabolically active tumor. 2. There are 2 foci of increased uptake within the neck and body of pancreas lymphomatous involvement of the pancreas. 3. Subcentimeter FDG avid right inguinal lymph node also concerning for lymphoma. 4. Several  foci of increased uptake within the axial skeleton compatible with malignancy."   2) HTN   3) Obstructive sleep apnea not on CPAP machine.  This is causing sleep issues and possible cognitive issues.  4) pulmonary emboli-likely related to his lymphoma plus obesity as risk factor plus possible some venous compression.  5) recurrent syncope-likely from either carotid arterial compression or vagal nerve compression.  CTA did not show any overt carotid stenosis at this time however they can be positional compression of the catheter.  PLAN: -Concerned that the primary tumor is pushing on his carotid artery in the neck and potentially causing vascular compression at the thoracic outlet. -PET/CT reviewed -Patient was scheduled to start outpatient chemoimmunotherapy with R-CHOP in our office on 10/26, but in light of his recurrent presyncopal/syncopal events I would recommend  first cycle of chemoimmunotherapy as inpatient.  I have again discussed the patient with nursing on the oncology unit and they are working to try to get him into a bed  on the unit once they have discharges.  I have again reached out to our pharmacy who is aware of the need for chemotherapy.  Will likely administer chemotherapy on Saturday, 08/18/2021.  We will plan for CHOP only this weekend with rituximab to be administered early next week, likely on Monday as inpatient. -Prednisone 52m po daily D2-D6 post chemotherapy -cannot do Udenyca as inpatient so -- will need Granix 480 mcg daily x 5 days starting 24 hours post chemotherapy. -He has a right PICC line in place.  Echocardiogram was performed on 08/01/2021 and LVEF is 60 to 65%. -Continue Lovenox twice daily for PE.   -Continue allopurinol due to high risk for tumor lysis syndrome. -Would recommend some permissive hypertension up to systolics of 1130-865to avoid syncopal episodes with vagal compression.  -Patient was recommended to make sure he stays well-hydrated since  hypovolemia will worsen potential risk for syncope.   Future Appointments  Date Time [place] 08/18/2021 10:45 AM [facility] None  08/27/2021 10:00 AM [facility] LAB [facility] None  08/27/2021 10:40 AM KBrunetta Genera MD [facility] None      LOS: 1 day   KMikey Bussing DNP, AGPCNP-BC, AOCNP 08/17/21   ADDENDUM  .Patient was Personally and independently interviewed, examined and relevant elements of the history of present illness were reviewed in details and an assessment and plan was created. All elements of the patient's history of present illness , assessment and plan were discussed in details with KMikey Bussing DNP, AGPCNP-BC, AOCNP. The above documentation reflects our combined findings assessment and plan.   GSullivan LoneMD MS

## 2021-08-18 ENCOUNTER — Encounter: Payer: Self-pay | Admitting: Hematology

## 2021-08-18 ENCOUNTER — Inpatient Hospital Stay: Payer: BC Managed Care – PPO

## 2021-08-18 DIAGNOSIS — Z5111 Encounter for antineoplastic chemotherapy: Secondary | ICD-10-CM

## 2021-08-18 DIAGNOSIS — R55 Syncope and collapse: Secondary | ICD-10-CM | POA: Diagnosis not present

## 2021-08-18 DIAGNOSIS — C8221 Follicular lymphoma grade III, unspecified, lymph nodes of head, face, and neck: Secondary | ICD-10-CM | POA: Diagnosis not present

## 2021-08-18 LAB — CBC WITH DIFFERENTIAL/PLATELET
Abs Immature Granulocytes: 0.03 10*3/uL (ref 0.00–0.07)
Basophils Absolute: 0 10*3/uL (ref 0.0–0.1)
Basophils Relative: 0 %
Eosinophils Absolute: 0.3 10*3/uL (ref 0.0–0.5)
Eosinophils Relative: 4 %
HCT: 40.8 % (ref 39.0–52.0)
Hemoglobin: 13.5 g/dL (ref 13.0–17.0)
Immature Granulocytes: 0 %
Lymphocytes Relative: 18 %
Lymphs Abs: 1.5 10*3/uL (ref 0.7–4.0)
MCH: 30.3 pg (ref 26.0–34.0)
MCHC: 33.1 g/dL (ref 30.0–36.0)
MCV: 91.7 fL (ref 80.0–100.0)
Monocytes Absolute: 0.6 10*3/uL (ref 0.1–1.0)
Monocytes Relative: 7 %
Neutro Abs: 5.7 10*3/uL (ref 1.7–7.7)
Neutrophils Relative %: 71 %
Platelets: 229 10*3/uL (ref 150–400)
RBC: 4.45 MIL/uL (ref 4.22–5.81)
RDW: 14.7 % (ref 11.5–15.5)
WBC: 8.1 10*3/uL (ref 4.0–10.5)
nRBC: 0 % (ref 0.0–0.2)

## 2021-08-18 MED ORDER — ENSURE ENLIVE PO LIQD
237.0000 mL | Freq: Two times a day (BID) | ORAL | Status: DC
  Administered 2021-08-20 – 2021-08-25 (×11): 237 mL via ORAL

## 2021-08-18 MED ORDER — SODIUM CHLORIDE 0.9% FLUSH: 10.0000 mL | INTRAVENOUS | Status: DC | PRN

## 2021-08-18 MED ORDER — SODIUM CHLORIDE 0.9% FLUSH
10.0000 mL | Freq: Two times a day (BID) | INTRAVENOUS | Status: DC
  Administered 2021-08-18 – 2021-08-21 (×7): 10 mL
  Administered 2021-08-21: 20 mL
  Administered 2021-08-22 – 2021-08-25 (×7): 10 mL

## 2021-08-18 NOTE — Progress Notes (Signed)
Initial Nutrition Assessment  DOCUMENTATION CODES:   Morbid obesity  INTERVENTION:  Provide Ensure Enlive po BID, each supplement provides 350 kcal and 20 grams of protein  Encourage adequate PO intake.   NUTRITION DIAGNOSIS:   Increased nutrient needs related to cancer and cancer related treatments as evidenced by estimated needs.  GOAL:   Patient will meet greater than or equal to 90% of their needs  MONITOR:   PO intake, Supplement acceptance, Diet advancement, Skin, Weight trends, Labs, I & O's  REASON FOR ASSESSMENT:   Malnutrition Screening Tool    ASSESSMENT:   65 year old male past medical history for hypertension and obstructive sleep apnea who presented with recurrent episodes of syncope. Recently diagnosed with follicular lymphoma. Pt with large soft tissue mass in the left side of his neck, running from the angle of mandible to the posterior cervical spinous processes.  Pt unavailable during attempted time of visit. Meal completion 100%. Per MD, pt dyspnea and dysphagia improving. RD to order nutritional supplements to aid in caloric and protein needs.  Unable to complete Nutrition-Focused physical exam at this time. Per oncology, plans to start first treatment of chemo during current hospitalization.   Labs and medications reviewed.   Diet Order:   Diet Order             DIET - DYS 1 Room service appropriate? Yes; Fluid consistency: Thin  Diet effective now                   EDUCATION NEEDS:   Not appropriate for education at this time  Skin:  Skin Assessment: Reviewed RN Assessment  Last BM:  10/28  Height:   Ht Readings from Last 1 Encounters:  08/17/21 5\' 9"  (1.753 m)    Weight:   Wt Readings from Last 1 Encounters:  08/18/21 (!) 147 kg   BMI:  Body mass index is 47.86 kg/m.  Estimated Nutritional Needs:   Kcal:  2100-2300  Protein:  120-140 grams  Fluid:  >/= 2 L/day  Corrin Parker, MS, RD, LDN RD pager number/after  hours weekend pager number on Am[employer].

## 2021-08-18 NOTE — Progress Notes (Signed)
PROGRESS NOTE    Joe [name]  XYI:[id number] DOB: September 10, 1956 DOA: 08/15/2021 PCP: Celene Squibb, MD    Brief Narrative:  Mr. Oien was admitted to the hospital with the working diagnosis of syncope.   65 year old male past medical history for hypertension, obesity and obstructive sleep apnea who presented with recurrent episodes of syncope.  He has been recently diagnosed with follicular lymphoma.  Recent diagnosis of pulmonary embolism about 2 weeks ago.  He reported losing his consciousness after experiencing severe left-sided neck pain, at the site were healing from a was found.  On his initial physical examination blood pressure 128/73, heart rate 74, respiratory rate 18, temperature 97.6, oxygen saturation 97%.  His lungs are clear to auscultation bilaterally, he had a large soft tissue mass in the left side of his neck, running from the angle of mandible to the posterior cervical spinous processes.  Heart S1-S2, present, rhythmic, soft abdomen, no lower extremity edema.  Sodium 136, potassium 4.2, chloride 98, bicarb 31, glucose 129, BUN 22, creatinine 1.17, white count 14.3, hemoglobin 14.9, hematocrit 44.4, platelets 300. SARS COVID-19 negative.  Chest radiograph no infiltrates.  Neck CT with significant interval increase in size of the large mass in the left neck which extends from the skull base superiorly into the upper mediastinum inferiorly.  The mass completely encases most of the left common carotid artery, and left internal carotid artery and much of the left external carotid artery.  Positive mass-effect on surrounding structures and moderate narrowing of the oropharyngeal airway.  EKG 70 bpm, left axis deviation, normal intervals, sinus rhythm, no significant ST segment T wave changes.  Likely large left neck lymphoma with mass affect on vascular structures, airway and thoracic outlet requiring urgent treatment.  Hematology oncology was consulted and patient will start  his first treatment of chemotherapy during his hospitalization.  Assessment & Plan:   Principal Problem:   Recurrent syncope Active Problems:   Pulmonary embolism (HCC)   Follicular lymphoma (HCC)   Essential hypertension   OSA (obstructive sleep apnea)   Obesity, Class III, BMI 40-49.9 (morbid obesity) ([place])   Syncope   Encounter for antineoplastic chemotherapy   Left neck follicular lymphoma with chest outlet obstruction and mass effect on vascular and airway structures, complicated with syncope.  Patient has bee started on systemic chemotherapy with good toleration. Improved dyspnea and dysphagia.  No chest pain or further syncope episode.  Continue oxymetry monitoring, and Ok to discontinue telemetry.   Follow uric acid and continue tumor lysis syndrome prophylaxis with allopurinol.   2. Pulmonary embolism. Continue anticoagulation with enoxaparin   3. HTN/ dyslipidemia. Blood pressure has been stable. Holding amlodipine for now to prevent hypotension  Continue with simvastatin   4. OSA. Obesity class 3 and depression.  Calculated BMI is 47,8 Continue with sertraline.  Cpap as tolerated   Patient continue to be at high risk for worsening mass effect from lymphoma   Status is: Inpatient  DVT prophylaxis: Enoxaparin   Code Status:    full  Family Communication:   No family at the bedside     Consultants:  Oncology      Subjective: Patient is feeling better, with improved dyspnea and dysphagia, no nausea or vomiting, no chest pain   Objective: Vitals:   08/18/21 0236 08/18/21 0543 08/18/21 0630 08/18/21 1356  BP: 133/69 135/76  (!) 145/87  Pulse: 70 70  87  Resp: 14 18  20   Temp: 97.8 F (36.6 C) 97.8 F (  36.6 C)  97.7 F (36.5 C)  TempSrc: Oral Oral  Oral  SpO2: 92% 91%  95%  Weight:   (!) 147 kg   Height:        Intake/Output Summary (Last 24 hours) at 08/18/2021 1544 Last data filed at 08/17/2021 1916 Gross per 24 hour  Intake 480 ml   Output --  Net 480 ml   Filed Weights   08/15/21 1332 08/17/21 1426 08/18/21 0630  Weight: (!) 144.7 kg (!) 143 kg (!) 147 kg    Examination:   General: Not in pain or dyspnea Neurology: Awake and alert, non focal  E ENT: no pallor, no icterus, oral mucosa moist.large left sided mass, no stridor.  Cardiovascular: No JVD. S1-S2 present, rhythmic, no gallops, rubs, or murmurs. No lower extremity edema. Pulmonary: positive breath sounds bilaterally, adequate air movement, no wheezing, rhonchi or rales. Gastrointestinal. Abdomen soft and non tender. Skin. No rashes Musculoskeletal: no joint deformities     Data Reviewed: I have personally reviewed following labs and imaging studies  CBC: Recent Labs  Lab 08/15/21 1353 08/16/21 0541 08/17/21 0453 08/18/21 0603  WBC 14.3* 13.7* 10.9* 8.1  NEUTROABS 11.7* 10.2* 8.0* 5.7  HGB 14.9 13.7 13.8 13.5  HCT 44.4 41.5 41.4 40.8  MCV 92.7 91.6 91.6 91.7  PLT 300 264 248 426   Basic Metabolic Panel: Recent Labs  Lab 08/15/21 1353 08/16/21 0541  NA 136 137  K 4.2 3.8  CL 98 102  CO2 31 28  GLUCOSE 129* 88  BUN 22 16  CREATININE 1.17 0.90  CALCIUM 8.8* 8.4*  MG  --  2.0   GFR: Estimated Creatinine Clearance: 120.3 mL/min (by C-G formula based on SCr of 0.9 mg/dL). Liver Function Tests: Recent Labs  Lab 08/15/21 1353 08/16/21 0541  AST 27 27  ALT 46* 47*  ALKPHOS 65 57  BILITOT 0.6 0.8  PROT 7.4 6.9  ALBUMIN 3.7 3.4*   No results for input(s): LIPASE, AMYLASE in the last 168 hours. No results for input(s): AMMONIA in the last 168 hours. Coagulation Profile: No results for input(s): INR, PROTIME in the last 168 hours. Cardiac Enzymes: No results for input(s): CKTOTAL, CKMB, CKMBINDEX, TROPONINI in the last 168 hours. BNP (last 3 results) No results for input(s): PROBNP in the last 8760 hours. HbA1C: No results for input(s): HGBA1C in the last 72 hours. CBG: No results for input(s): GLUCAP in the last 168  hours. Lipid Profile: No results for input(s): CHOL, HDL, LDLCALC, TRIG, CHOLHDL, LDLDIRECT in the last 72 hours. Thyroid Function Tests: No results for input(s): TSH, T4TOTAL, FREET4, T3FREE, THYROIDAB in the last 72 hours. Anemia Panel: No results for input(s): VITAMINB12, FOLATE, FERRITIN, TIBC, IRON, RETICCTPCT in the last 72 hours.    Radiology Studies: I have reviewed all of the imaging during this hospital visit personally     Scheduled Meds:  allopurinol  300 mg Oral Daily   Chlorhexidine Gluconate Cloth  6 each Topical Daily   cyclophosphamide  750 mg/m2 (Treatment Plan Recorded) Intravenous Once   enoxaparin (LOVENOX) injection  1 mg/kg Subcutaneous Q12H   sertraline  50 mg Oral Daily   simvastatin  40 mg Oral Once per day on Mon Wed Fri   sodium chloride flush  10-40 mL Intracatheter Q12H   Continuous Infusions:   LOS: 2 days        Shamon Cothran Gerome Apley, MD

## 2021-08-18 NOTE — Progress Notes (Signed)
HEMATOLOGY-ONCOLOGY PROGRESS NOTE  DOS 08/18/2021   SUBJECTIVE:   Joe [name] seen this morning in oncologic followup.  Mild headaches overnight and some desaturation into the low 90s and upper 80s while sleeping due to his likely sleep apnea which is in the process of being formally evaluated. Oxygen saturation is 95 to 97% this morning when awake and without oxygen. Headaches have resolved. Patient is getting his premedications and starting his chemotherapy [facility] today.  No other acute issues at this time.  Discussed his plan of care and condition with his RN at bedside. Plan to do Rituxan on Monday when more monitoring in person is available. Patient has had no other new episodes of presyncope or syncope.  Has been drinking a lot of water as instructed. No other acute new symptoms.  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  PHYSICAL EXAMINATION: ECOG PERFORMANCE STATUS: 1 - Symptomatic but completely ambulatory  Vitals:   08/18/21 0236 08/18/21 0543  BP: 133/69 135/76  Pulse: 70 70  Resp: 14 18  Temp: 97.8 F (36.6 C) 97.8 F (36.6 C)  SpO2: 92% 91%   Filed Weights   08/15/21 1332 08/17/21 1426 08/18/21 0630  Weight: (!) 319 lb (144.7 kg) (!) 315 lb 4.1 oz (143 kg) (!) 324 lb 1.2 oz (147 kg)    Intake/Output from previous day: 10/28 0701 - 10/29 0700 In: 480 [P.O.:480] Out: -  . GENERAL:alert, in no acute distress and comfortable SKIN: no acute rashes, no significant lesions OROPHARYNX: MMM, no thrush no ulcers NECK: Large left neck mass extending into the superior mediastinum obliterating the supra sternal notch. LUNGS: clear to auscultation b/l with normal respiratory effort HEART: regular rate & rhythm ABDOMEN:   normoactive bowel sounds , non tender, not distended. Extremity: 1+ pedal edema PSYCH: alert & oriented x 3 with fluent speech NEURO: no focal motor/sensory deficits   LABORATORY DATA:  I have reviewed the data as listed CMP Latest Ref Rng & Units 08/16/2021 08/15/2021 08/09/2021  Glucose 70 - 99 mg/dL 88 129(H) 95  BUN 8 - 23 mg/dL 16 22 26(H)  Creatinine 0.61 - 1.24 mg/dL 0.90 1.17 1.17  Sodium 135 - 145 mmol/L 137 136 138  Potassium 3.5 - 5.1 mmol/L 3.8 4.2 3.9  Chloride 98 - 111 mmol/L 102 98 102  CO2 22 - 32 mmol/L '28 31 25  ' Calcium 8.9 - 10.3 mg/dL 8.4(L) 8.8(L) 9.5  Total Protein 6.5 - 8.1 g/dL 6.9 7.4 7.6  Total Bilirubin 0.3 - 1.2 mg/dL 0.8 0.6 0.5  Alkaline Phos 38 - 126 U/L 57 65 80  AST 15 - 41 U/L '27 27 20  ' ALT 0 - 44 U/L 47(H) 46(H) 43    Lab Results  Component Value Date   WBC 8.1 08/18/2021   HGB 13.5 08/18/2021   HCT 40.8 08/18/2021   MCV 91.7 08/18/2021   PLT 229 08/18/2021   NEUTROABS 5.7 08/18/2021    CT Angio Head W or Wo Contrast  Result Date: 08/15/2021 CLINICAL DATA:  Syncope and hypoxia, large neck mass due to non-Hodgkin's lymphoma EXAM: CT ANGIOGRAPHY HEAD AND NECK TECHNIQUE: Multidetector CT imaging of the head and neck was performed using the standard protocol during  bolus administration of intravenous contrast. Multiplanar CT image reconstructions and MIPs were obtained to evaluate the vascular anatomy. Carotid stenosis measurements (when applicable) are obtained utilizing NASCET criteria, using the distal internal carotid diameter as the denominator. CONTRAST:  127m OMNIPAQUE IOHEXOL 350 MG/ML SOLN COMPARISON:  CT neck 07/09/2021 FINDINGS: CT HEAD FINDINGS Brain: There is no acute intracranial hemorrhage, extra-axial fluid collection, or acute infarct. The ventricles are normal in size. There is no mass lesion. There is no midline shift. Vascular: No hyperdense vessel or unexpected calcification. The vasculature is assessed info below Skull:  Normal. Negative for fracture or focal lesion. Sinuses: There is moderate mucosal thickening with layering fluid in the right maxillary sinus. There is surrounding hyperostosis consistent with chronic maxillary sinusitis. Orbits: The globes and orbits are unremarkable. Review of the MIP images confirms the above findings CTA NECK FINDINGS Aortic arch: The aortic arch is unremarkable. Right carotid system: The right common, internal, and external carotid arteries are patent with no significant stenosis, dissection, or aneurysm. Left carotid system: The proximal left common carotid artery is unremarkable. There is complete encasement of the common carotid artery from the level of the clavicular head through the bifurcation with complete encasement of the internal and external carotid arteries. The internal carotid artery is encased from the bifurcation through the petrous segment. The distal branches of the external carotid artery are not encased. There is no significant narrowing of the left carotid system. There is no evidence of dissection or aneurysm. There is minimal calcified atherosclerotic plaque in the carotid bulb. Vertebral arteries: The vertebral arteries are patent, with no significant stenosis, dissection, or aneurysm. Skeleton: There is multilevel degenerative change of the cervical spine. There is no acute osseous abnormality or aggressive osseous lesion. Other neck: There is a large infiltrative soft tissue mass throughout the soft tissues of the left neck. The mass measures up to approximately 8.9 cm AP by 9.3 cm TV by 13.8 cm cc, increased in size since 07/09/2021 when it measured approximately 6.6 cm TV by 6.0 cm AP by 10.0 cm cc, measured again using similar technique. Inferiorly, the mass extends into the upper mediastinum just posterior to the clavicle. The mass is inseparable from the sternocleidomastoid. As above, there is complete encasement of the carotid vasculature without narrowing of the  arteries. The airway is shifted to the right and moderately narrowed at the level of the oropharynx. Upper chest: The lungs are assessed on the separately dictated CT chest. Review of the MIP images confirms the above findings CTA HEAD FINDINGS Anterior circulation: The bilateral intracranial ICAs are patent. The bilateral MCAs are patent The bilateral ACAs are patent. There is no aneurysm. Posterior circulation: The bilateral V4 segments are patent. The basilar artery is patent. The bilateral PCAs are patent. The posterior communicating arteries are not definitely identified. Venous sinuses: There is a hypodense filling defect in the right sigmoid sinus. Anatomic variants: As above. Review of the MIP images confirms the above findings IMPRESSION: Since 07/09/2021: 1. Significant interval increase in size of the large mass in the left neck which extends from the skull base superiorly into the upper mediastinum inferiorly. The mass completely encases most of the left common carotid artery, the entire left internal carotid artery from the bifurcation to the skull base, and much of the left external carotid artery and branches. Despite the encasement, there is no significant narrowing of the left carotid system. The mass results in significant mass effect on surrounding structures and moderate narrowing of  the oropharyngeal airway. 2. Otherwise, unremarkable vasculature of the head and neck. 3. Hypodense filling defect in the right sigmoid sinus concerning for nonocclusive thrombus. 4. Chronic right maxillary sinusitis. Electronically Signed   By: Valetta Mole M.D.   On: 08/15/2021 19:24   DG Chest 2 View  Result Date: 08/15/2021 CLINICAL DATA:  Hypoxia.  Lymphoma. EXAM: CHEST - 2 VIEW COMPARISON:  07/31/2021 FINDINGS: Right arm PICC tip in the SVC 4 cm above the right atrium. Heart size is normal. The lungs are clear. No effusions. Left neck mass demonstrated with displacement of the trachea towards the right.  IMPRESSION: No active cardiopulmonary disease. PICC tip in the SVC 4 cm above the right atrium. Known left neck mass. Electronically Signed   By: Nelson Chimes M.D.   On: 08/15/2021 14:43   DG Chest 2 View  Result Date: 07/28/2021 CLINICAL DATA:  Seizure-like activity. EXAM: CHEST - 2 VIEW COMPARISON:  June 08, 2019 FINDINGS: The heart size and mediastinal contours are within normal limits. Both lungs are clear. The visualized skeletal structures are unremarkable. IMPRESSION: No active cardiopulmonary disease. Electronically Signed   By: Fidela Salisbury M.D.   On: 07/28/2021 14:51   CT Head Wo Contrast  Result Date: 07/28/2021 CLINICAL DATA:  Seizure activity, frontal scalp abrasion EXAM: CT HEAD WITHOUT CONTRAST TECHNIQUE: Contiguous axial images were obtained from the base of the skull through the vertex without intravenous contrast. COMPARISON:  None. FINDINGS: Brain: No acute infarct or hemorrhage. Lateral ventricles and midline structures are unremarkable. No acute extra-axial fluid collections. No mass effect. Vascular: No hyperdense vessel or unexpected calcification. Skull: Normal. Negative for fracture or focal lesion. Sinuses/Orbits: Mild mucosal thickening within the maxillary sinuses. Remaining paranasal sinuses are clear. Other: None. IMPRESSION: 1. No acute intracranial process. Electronically Signed   By: Randa Ngo M.D.   On: 07/28/2021 18:39   CT Angio Neck W and/or Wo Contrast  Result Date: 08/15/2021 CLINICAL DATA:  Syncope and hypoxia, large neck mass due to non-Hodgkin's lymphoma EXAM: CT ANGIOGRAPHY HEAD AND NECK TECHNIQUE: Multidetector CT imaging of the head and neck was performed using the standard protocol during bolus administration of intravenous contrast. Multiplanar CT image reconstructions and MIPs were obtained to evaluate the vascular anatomy. Carotid stenosis measurements (when applicable) are obtained utilizing NASCET criteria, using the distal internal  carotid diameter as the denominator. CONTRAST:  146m OMNIPAQUE IOHEXOL 350 MG/ML SOLN COMPARISON:  CT neck 07/09/2021 FINDINGS: CT HEAD FINDINGS Brain: There is no acute intracranial hemorrhage, extra-axial fluid collection, or acute infarct. The ventricles are normal in size. There is no mass lesion. There is no midline shift. Vascular: No hyperdense vessel or unexpected calcification. The vasculature is assessed info below Skull: Normal. Negative for fracture or focal lesion. Sinuses: There is moderate mucosal thickening with layering fluid in the right maxillary sinus. There is surrounding hyperostosis consistent with chronic maxillary sinusitis. Orbits: The globes and orbits are unremarkable. Review of the MIP images confirms the above findings CTA NECK FINDINGS Aortic arch: The aortic arch is unremarkable. Right carotid system: The right common, internal, and external carotid arteries are patent with no significant stenosis, dissection, or aneurysm. Left carotid system: The proximal left common carotid artery is unremarkable. There is complete encasement of the common carotid artery from the level of the clavicular head through the bifurcation with complete encasement of the internal and external carotid arteries. The internal carotid artery is encased from the bifurcation through the petrous segment. The distal branches of the  external carotid artery are not encased. There is no significant narrowing of the left carotid system. There is no evidence of dissection or aneurysm. There is minimal calcified atherosclerotic plaque in the carotid bulb. Vertebral arteries: The vertebral arteries are patent, with no significant stenosis, dissection, or aneurysm. Skeleton: There is multilevel degenerative change of the cervical spine. There is no acute osseous abnormality or aggressive osseous lesion. Other neck: There is a large infiltrative soft tissue mass throughout the soft tissues of the left neck. The mass  measures up to approximately 8.9 cm AP by 9.3 cm TV by 13.8 cm cc, increased in size since 07/09/2021 when it measured approximately 6.6 cm TV by 6.0 cm AP by 10.0 cm cc, measured again using similar technique. Inferiorly, the mass extends into the upper mediastinum just posterior to the clavicle. The mass is inseparable from the sternocleidomastoid. As above, there is complete encasement of the carotid vasculature without narrowing of the arteries. The airway is shifted to the right and moderately narrowed at the level of the oropharynx. Upper chest: The lungs are assessed on the separately dictated CT chest. Review of the MIP images confirms the above findings CTA HEAD FINDINGS Anterior circulation: The bilateral intracranial ICAs are patent. The bilateral MCAs are patent The bilateral ACAs are patent. There is no aneurysm. Posterior circulation: The bilateral V4 segments are patent. The basilar artery is patent. The bilateral PCAs are patent. The posterior communicating arteries are not definitely identified. Venous sinuses: There is a hypodense filling defect in the right sigmoid sinus. Anatomic variants: As above. Review of the MIP images confirms the above findings IMPRESSION: Since 07/09/2021: 1. Significant interval increase in size of the large mass in the left neck which extends from the skull base superiorly into the upper mediastinum inferiorly. The mass completely encases most of the left common carotid artery, the entire left internal carotid artery from the bifurcation to the skull base, and much of the left external carotid artery and branches. Despite the encasement, there is no significant narrowing of the left carotid system. The mass results in significant mass effect on surrounding structures and moderate narrowing of the oropharyngeal airway. 2. Otherwise, unremarkable vasculature of the head and neck. 3. Hypodense filling defect in the right sigmoid sinus concerning for nonocclusive thrombus.  4. Chronic right maxillary sinusitis. Electronically Signed   By: Valetta Mole M.D.   On: 08/15/2021 19:24   CT Angio Chest PE W and/or Wo Contrast  Result Date: 08/15/2021 CLINICAL DATA:  Concern for pulmonary embolism. EXAM: CT ANGIOGRAPHY CHEST WITH CONTRAST TECHNIQUE: Multidetector CT imaging of the chest was performed using the standard protocol during bolus administration of intravenous contrast. Multiplanar CT image reconstructions and MIPs were obtained to evaluate the vascular anatomy. CONTRAST:  140m OMNIPAQUE IOHEXOL 350 MG/ML SOLN COMPARISON:  Chest CT dated 07/31/2021. FINDINGS: Cardiovascular: There is no cardiomegaly or pericardial effusion. The thoracic aorta is unremarkable. Evaluation of the pulmonary arteries is limited due to respiratory motion artifact and suboptimal visualization of the peripheral branches. No large or central pulmonary artery embolus identified. Right sided PICC now extends superiorly into the right IJ. The PICC appears to bend in the right IJ and turns downward. The tip of the PICC appears to be in the right IJ at the level of the subclavian vein (33/6 and coronal 91/7). Evaluation however is limited due to presence of intravenous contrast. Mediastinum/Nodes: No hilar or mediastinal adenopathy. The esophagus is grossly unremarkable. Partially visualized mass in the left side  of the neck measures 9.1 x 8.4 cm and increase in size since the study of 07/09/2021 (previously measuring approximately 7.1 x 6.0 cm). There is associated mass effect and deviation of the trachea to the right. No mediastinal fluid collection. Lungs/Pleura: No focal consolidation, pleural effusion, or pneumothorax. The central airways are patent. Upper Abdomen: No acute abnormality. Musculoskeletal: Degenerative changes of the spine. No acute osseous pathology. Review of the MIP images confirms the above findings. IMPRESSION: 1. No CT evidence of central pulmonary artery embolus. 2. Right sided PICC  now extends superiorly into the right IJ. The tip of the PICC appears to be in the right IJ at the level of the subclavian vein. Evaluation however is limited due to presence of intravenous contrast. 3. Partially visualized mass in the left side of the neck has increased in size since the study of 07/09/2021 and is concerning for malignancy. These results were called by telephone at the time of interpretation on 08/15/2021 at 7:39 pm to Dr. Gilford Raid, who verbally acknowledged these results. Electronically Signed   By: Anner Crete M.D.   On: 08/15/2021 19:49   CT Angio Chest PE W and/or Wo Contrast  Addendum Date: 07/31/2021   ADDENDUM REPORT: 07/31/2021 19:50 ADDENDUM: These results were called by telephone at the time of interpretation on 07/31/2021 at 7:48 pm to provider JOSEPH ZAMMIT , who verbally acknowledged these results. Electronically Signed   By: Iven Finn M.D.   On: 07/31/2021 19:50   Result Date: 07/31/2021 CLINICAL DATA:  PE suspected, low/intermediate prob, neg D-dimer EXAM: CT ANGIOGRAPHY CHEST WITH CONTRAST TECHNIQUE: Multidetector CT imaging of the chest was performed using the standard protocol during bolus administration of intravenous contrast. Multiplanar CT image reconstructions and MIPs were obtained to evaluate the vascular anatomy. CONTRAST:  134m OMNIPAQUE IOHEXOL 350 MG/ML SOLN COMPARISON:  None. FINDINGS: Cardiovascular: Satisfactory opacification of the pulmonary arteries to the segmental level. Segmental and subsegmental right lower lobe pulmonary embolus. Segmental and subsegmental right middle lobe pulmonary embolus. Limited evaluation of left lower lobe with question subsegmental pulmonary emboli. Normal heart size. No pericardial effusion. Mediastinum/Nodes: No enlarged mediastinal, hilar, or axillary lymph nodes. Thyroid gland, trachea, and esophagus demonstrate no significant findings. Lungs/Pleura: Limited evaluation due to motion artifact. Expiratory phase of  respiration. Mosaic attenuation of the lungs. No focal consolidation. No pulmonary nodule. No pulmonary mass. No pleural effusion. No pneumothorax. Upper Abdomen: Possible mild hepatic steatosis. Musculoskeletal: No chest wall abnormality. No suspicious lytic or blastic osseous lesions. No acute displaced fracture. Multilevel degenerative changes of the spine. Review of the MIP images confirms the above findings. IMPRESSION: Segmental and subsegmental right lower and middle lobe pulmonary emboli. Question left lower lobe subsegmental pulmonary emboli. No findings of right heart strain or pulmonary infarction. Electronically Signed: By: MIven FinnM.D. 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 EXAM: NUCLEAR MEDICINE PET SKULL BASE TO THIGH TECHNIQUE: 16.9 mCi F-18 FDG was injected intravenously. Full-ring PET imaging was performed from the skull base to thigh after the radiotracer. CT data was obtained and used for attenuation correction and anatomic localization. Fasting blood glucose: 121 mg/dl COMPARISON:  None FINDINGS: Mediastinal blood pool activity: SUV max 3.78 Liver activity: SUV max 4.33 NECK: There is a large nodal mass within the left cervical chain extending from the level 2 region through level 4 and 5. This measures the 9.6 x  8.5 by 13.9 cm and has an SUV max 26.47, image 43/4. The tumor extends into the superior mediastinum along with the left paratracheal region, image 56/4. There is also tumor extension into the prevertebral soft tissue space at the level of the oropharynx with involvement of the right tonsillar fossa. Tumor also extends into the prevertebral soft tissues at the level of the hypopharynx with mass effect upon the hypopharynx with deviation towards the right. Incidental CT findings: none CHEST: Mild FDG uptake associated with 9 mm subcarinal  lymph node within SUV max of 3.68, image 83/4. No FDG avid pulmonary nodule. Incidental CT findings: Mild aortic atherosclerosis and coronary artery calcifications. No pleural effusion. ABDOMEN/PELVIS: No abnormal FDG uptake identified within the liver. Within the neck of pancreas there is a approximately 1.4 cm focus of increased uptake with SUV max of 7.88. Within the body of the pancreas there is a focus of increased uptake measuring 2 cm with SUV max of 14.0. Normal size spleen without focal areas of increased uptake. Adrenal glands appear normal. No FDG avid lymph nodes within the abdomen or pelvis. FDG avid right inguinal node measures 7 mm within SUV max of 6.96, image 199/4. Incidental CT findings: Large fat containing umbilical hernia measures 8.0 x 6.3 cm. Small bilateral fat containing inguinal hernias. SKELETON: There are a few scattered FDG avid bone lesions, without corresponding CT changes. For example, within the left humeral head there is a moderate to intense focus of increased uptake within SUV max of 9.87. Within the transverse process the T10 vertebra on the left there is a focus of increased uptake within SUV max of 8.85. Within the inferior endplate of the L2 vertebral body there is a small focus of increased uptake with SUV max of 8.27. Incidental CT findings: none IMPRESSION: 1. Examination is positive for large FDG avid mass within the left neck extending from the level 2 nodal chain into the left side of the superior mediastinum compatible with metabolically active tumor. 2. There are 2 foci of increased uptake within the neck and body of pancreas lymphomatous involvement of the pancreas. 3. Subcentimeter FDG avid right inguinal lymph node also concerning for lymphoma. 4. Several foci of increased uptake within the axial skeleton compatible with malignancy. Electronically Signed   By: Kerby Moors M.D.   On: 08/14/2021 06:22   US Venous Img Lower Bilateral (DVT)  Result Date:  08/01/2021 CLINICAL DATA:  Pulmonary emboli on CTA chest 07/31/2021. Lower extremity edema. EXAM: Bilateral LOWER EXTREMITY VENOUS DOPPLER ULTRASOUND TECHNIQUE: Gray-scale sonography with compression, as well as color and duplex ultrasound, were performed to evaluate the deep venous system(s) from the level of the common femoral vein through the popliteal and proximal calf veins. COMPARISON:  None. FINDINGS: VENOUS Left side: Substantial thrombus in the superficial femoral vein and profundus femoral vein on the left, with no significant compressibility, partially occlusive but not completely occlusive. This extends into the posterior tibial veins and peroneal veins as well as the popliteal vein. The common femoral vein appears patent. Right side: Normal appearance of the common femoral vein, superficial femoral vein, popliteal vein, and calf veins on the right. Expected compressibility and augmentation. OTHER None. Limitations: none IMPRESSION: 1. Positive for left-sided DVT with left superficial femoral vein, profundus femoral vein, popliteal vein, and calf vein thrombosis on the left side, in this patient with recent diagnosis of acute pulmonary embolus. No findings of DVT on the right side. Electronically Signed   By: Cindra Eves.D.  On: 08/01/2021 15:50   DG Chest Port 1 View  Result Date: 07/31/2021 CLINICAL DATA:  Wheezing, history of COVID positive. EXAM: PORTABLE CHEST 1 VIEW COMPARISON:  July 28, 2021 FINDINGS: The heart size and mediastinal contours are within normal limits. Both lungs are clear. The visualized skeletal structures are unremarkable. IMPRESSION: No active disease. Electronically Signed   By: Virgina Norfolk M.D.   On: 07/31/2021 16:22   ECHOCARDIOGRAM COMPLETE  Result Date: 08/01/2021    ECHOCARDIOGRAM REPORT   Patient Name:   DONTREL SMETHERS Date of Exam: 08/01/2021 Medical Rec #:  [id number]       Height:       69.0 in Accession #:    [id number]      Weight:        323.4 lb Date of Birth:  08-Dec-1955       BSA:          2.534 m Patient Age:    25 years        BP:           149/82 mmHg Patient Gender: M               HR:           95 bpm. Exam Location:  [facility] Procedure: 2D Echo, Cardiac Doppler and Color Doppler Indications:    Pulmonary Embolus I26.09  History:        Patient has no prior history of Echocardiogram examinations.                 Risk Factors:Hypertension and Dyslipidemia. Obesity. OSA                 (obstructive sleep apnea). Covid-19 infection. Cancer ([place])                 (From Hx).  Sonographer:    Alvino Chapel RCS Referring Phys: [id number] [place]  1. Left ventricular ejection fraction, by estimation, is 60 to 65%. The left ventricle has normal function. The left ventricle has no regional wall motion abnormalities. Left ventricular diastolic parameters were normal.  2. Right ventricular systolic function is normal. The right ventricular size is normal. Tricuspid regurgitation signal is inadequate for assessing PA pressure.  3. The mitral valve is grossly normal. No evidence of mitral valve regurgitation. No evidence of mitral stenosis.  4. The aortic valve is tricuspid. Aortic valve regurgitation is not visualized. No aortic stenosis is present.  5. The inferior vena cava is normal in size with greater than 50% respiratory variability, suggesting right atrial pressure of 3 mmHg. Conclusion(s)/Recommendation(s): Normal biventricular function without evidence of hemodynamically significant valvular heart disease. FINDINGS  Left Ventricle: Left ventricular ejection fraction, by estimation, is 60 to 65%. The left ventricle has normal function. The left ventricle has no regional wall motion abnormalities. The left ventricular internal cavity size was normal in size. There is  no left ventricular hypertrophy. Left ventricular diastolic parameters were normal. Right Ventricle: The right ventricular size is normal. No increase in right  ventricular wall thickness. Right ventricular systolic function is normal. Tricuspid regurgitation signal is inadequate for assessing PA pressure. Left Atrium: Left atrial size was normal in size. Right Atrium: Right atrial size was normal in size. Pericardium: Trivial pericardial effusion is present. Presence of pericardial fat pad. Mitral Valve: The mitral valve is grossly normal. No evidence of mitral valve regurgitation. No evidence of mitral valve stenosis. Tricuspid Valve: The tricuspid valve is grossly  normal. Tricuspid valve regurgitation is trivial. No evidence of tricuspid stenosis. Aortic Valve: The aortic valve is tricuspid. Aortic valve regurgitation is not visualized. No aortic stenosis is present. Aortic valve mean gradient measures 8.0 mmHg. Aortic valve peak gradient measures 16.5 mmHg. Aortic valve area, by VTI measures 2.63  cm. Pulmonic Valve: The pulmonic valve was grossly normal. Pulmonic valve regurgitation is not visualized. No evidence of pulmonic stenosis. Aorta: The aortic root is normal in size and structure. Venous: The right lower pulmonary vein is normal. The inferior vena cava is normal in size with greater than 50% respiratory variability, suggesting right atrial pressure of 3 mmHg. IAS/Shunts: The atrial septum is grossly normal.  LEFT VENTRICLE PLAX 2D LVIDd:         4.50 cm   Diastology LVIDs:         2.70 cm   LV e' medial:    11.10 cm/s LV PW:         1.10 cm   LV E/e' medial:  7.6 LV IVS:        1.10 cm   LV e' lateral:   12.30 cm/s LVOT diam:     2.00 cm   LV E/e' lateral: 6.8 LV SV:         97 LV SV Index:   38 LVOT Area:     3.14 cm  RIGHT VENTRICLE RV S prime:     21.10 cm/s TAPSE (M-mode): 3.2 cm LEFT ATRIUM             Index        RIGHT ATRIUM           Index LA diam:        3.60 cm 1.42 cm/m   RA Area:     11.70 cm LA Vol (A2C):   33.8 ml 13.34 ml/m  RA Volume:   26.20 ml  10.34 ml/m LA Vol (A4C):   48.7 ml 19.22 ml/m LA Biplane Vol: 42.7 ml 16.85 ml/m  AORTIC  VALVE AV Area (Vmax):    2.62 cm AV Area (Vmean):   2.82 cm AV Area (VTI):     2.63 cm AV Vmax:           203.00 cm/s AV Vmean:          135.000 cm/s AV VTI:            0.371 m AV Peak Grad:      16.5 mmHg AV Mean Grad:      8.0 mmHg LVOT Vmax:         169.00 cm/s LVOT Vmean:        121.000 cm/s LVOT VTI:          0.310 m LVOT/AV VTI ratio: 0.84  AORTA Ao Root diam: 3.40 cm MITRAL VALVE MV Area (PHT): 2.83 cm     SHUNTS MV Decel Time: 268 msec     Systemic VTI:  0.31 m MV E velocity: 83.90 cm/s   Systemic Diam: 2.00 cm MV A velocity: 112.00 cm/s MV E/A ratio:  0.75 Eleonore Chiquito MD Electronically signed by Eleonore Chiquito MD Signature Date/Time: 08/01/2021/4:20:57 PM    Final    IR PICC PLACEMENT RIGHT >5 YRS INC IMG GUIDE  Result Date: 08/14/2021 INDICATION: Recent diagnosis of lymphoma. Request made for placement of a PICC line for the initiation of chemotherapy Note, initial plan was to proceed with port a catheter placement however the patient has recently been diagnosed with DVT and  pulmonary embolism and referring oncologist has requested a PICC line initially as the patient is begun on anticoagulation. EXAM: ULTRASOUND AND FLUOROSCOPIC GUIDED PICC LINE INSERTION MEDICATIONS: None. CONTRAST:  None FLUOROSCOPY TIME:  18 seconds (2 mGy) COMPLICATIONS: None immediate. TECHNIQUE: The procedure, risks, benefits, and alternatives were explained to the patient's niece and informed written consent was obtained. A timeout was performed prior to the initiation of the procedure. The right upper extremity was prepped with chlorhexidine in a sterile fashion, and a sterile drape was applied covering the operative field. Maximum barrier sterile technique with sterile gowns and gloves were used for the procedure. A timeout was performed prior to the initiation of the procedure. Local anesthesia was provided with 1% lidocaine. Under direct ultrasound guidance, the brachial vein was accessed with a micropuncture kit  after the overlying soft tissues were anesthetized with 1% lidocaine. Real-time ultrasound guidance was utilized for vascular access including the acquisition of a permanent ultrasound image documenting patency of the accessed vessel. A guidewire was advanced to the level of the superior caval-atrial junction for measurement purposes and the PICC line was cut to length. A peel-away sheath was placed and a 40 cm, 5 [place], dual lumen was inserted to level of the superior caval-atrial junction. A post procedure spot fluoroscopic was obtained. The catheter easily aspirated and flushed and was secured in place with stat lock device. A dressing was applied. The patient tolerated the procedure well without immediate post procedural complication. FINDINGS: After catheter placement, the tip lies within the superior cavoatrial junction. The catheter aspirates and flushes normally and is ready for immediate use. IMPRESSION: Successful ultrasound and fluoroscopic guided placement of a right brachial vein approach, 40 cm, 5 French, dual lumen PICC with tip at the superior caval-atrial junction. The PICC line is ready for immediate use. Electronically Signed   By: Sandi Mariscal M.D.   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 Based on limited sampling the patient's pathology reveals a grade 1 through 2 follicular lymphoma with a Ki-67 of 25%.  There was a lot of crush artifact in the sample and therefore a high-grade process could not be ruled out. However the predominant element of his mass has grown significantly over the last 1 month which is concerning for a high-grade follicular lymphoma or follicular lymphoma with transformation to large B-cell lymphoma.   CT neck imaging shows concern with compression of left internal jugular vein with mild mass-effect on the airway in the neck and the left carotid is  inseparable from the mass.   High risk of syncopal episodes if there is compression of the common carotid baroreceptors.  -PET scan performed 08/10/2021- "1. Examination is positive for large FDG avid mass within the left neck extending from the level 2 nodal chain into the left side of the superior mediastinum compatible with metabolically active tumor. 2. There are 2 foci of increased uptake within the neck and body of pancreas lymphomatous involvement of the pancreas. 3. Subcentimeter FDG avid right inguinal lymph node also concerning for lymphoma. 4. Several foci of increased uptake within the axial skeleton compatible with malignancy."   2) HTN   3) Obstructive sleep apnea not on CPAP machine.  This is causing sleep issues and possible cognitive issues.  4) pulmonary emboli-likely related to his lymphoma plus obesity as risk factor plus possible some venous compression.  5)  recurrent syncope-likely from either carotid arterial compression or vagal nerve compression.  CTA did not show any overt carotid stenosis at this time however there can be positional compression of the carotid artery.  PLAN: - receiving [facility] only this weekend with rituximab to be administered, on Monday as inpatient. -Prednisone 14m po daily D2-D6 post chemotherapy -cannot do Udenyca as inpatient so -- will need Granix 480 mcg daily x 5 days starting 24 hours post chemotherapy (will start on Monday). -He has a right PICC line in place.  Echocardiogram was performed on 08/01/2021 and LVEF is 60 to 65%. -Continue Lovenox twice daily for PE.  No issues with bleeding. -daily labs x 3- ordered -Continue allopurinol due to high risk for tumor lysis syndrome. -Would recommend some permissive hypertension up to systolics of 1912-258to avoid syncopal episodes with vagal compression.  -Patient was recommended to make sure he stays well-hydrated since hypovolemia will worsen potential risk for syncope.   Future Appointments   Date Time [place] 08/27/2021 10:00 AM [facility] None  08/27/2021 10:40 AM Cheral Cappucci, [facility]Cloria [place] [facility]    GSullivan LoneMD MS

## 2021-08-19 LAB — COMPREHENSIVE METABOLIC PANEL
ALT: 76 U/L — ABNORMAL HIGH (ref 0–44)
AST: 53 U/L — ABNORMAL HIGH (ref 15–41)
Albumin: 3.4 g/dL — ABNORMAL LOW (ref 3.5–5.0)
Alkaline Phosphatase: 58 U/L (ref 38–126)
Anion gap: 7 (ref 5–15)
BUN: 18 mg/dL (ref 8–23)
CO2: 28 mmol/L (ref 22–32)
Calcium: 8.5 mg/dL — ABNORMAL LOW (ref 8.9–10.3)
Chloride: 100 mmol/L (ref 98–111)
Creatinine, Ser: 0.83 mg/dL (ref 0.61–1.24)
GFR, Estimated: >60 mL/min (ref >60)
Glucose, Bld: 125 mg/dL — ABNORMAL HIGH (ref 70–99)
Potassium: 4.2 mmol/L (ref 3.5–5.1)
Sodium: 135 mmol/L (ref 135–145)
Total Bilirubin: 0.8 mg/dL (ref 0.3–1.2)
Total Protein: 7.2 g/dL (ref 6.5–8.1)

## 2021-08-19 LAB — CBC WITH DIFFERENTIAL/PLATELET
Abs Immature Granulocytes: 0.04 10*3/uL (ref 0.00–0.07)
Basophils Absolute: 0 10*3/uL (ref 0.0–0.1)
Basophils Relative: 0 %
Eosinophils Absolute: 0 10*3/uL (ref 0.0–0.5)
Eosinophils Relative: 0 %
HCT: 40.3 % (ref 39.0–52.0)
Hemoglobin: 13.4 g/dL (ref 13.0–17.0)
Immature Granulocytes: 0 %
Lymphocytes Relative: 8 %
Lymphs Abs: 0.7 10*3/uL (ref 0.7–4.0)
MCH: 30.6 pg (ref 26.0–34.0)
MCHC: 33.3 g/dL (ref 30.0–36.0)
MCV: 92 fL (ref 80.0–100.0)
Monocytes Absolute: 0.3 10*3/uL (ref 0.1–1.0)
Monocytes Relative: 4 %
Neutro Abs: 8 10*3/uL — ABNORMAL HIGH (ref 1.7–7.7)
Neutrophils Relative %: 88 %
Platelets: 242 10*3/uL (ref 150–400)
RBC: 4.38 MIL/uL (ref 4.22–5.81)
RDW: 14.2 % (ref 11.5–15.5)
WBC: 9.1 10*3/uL (ref 4.0–10.5)
nRBC: 0 % (ref 0.0–0.2)

## 2021-08-19 LAB — URIC ACID: Uric Acid, Serum: 4.5 mg/dL (ref 3.7–8.6)

## 2021-08-19 MED ORDER — AMLODIPINE BESYLATE 10 MG PO TABS
10.0000 mg | ORAL_TABLET | Freq: Every day | ORAL | Status: DC
  Administered 2021-08-19 – 2021-08-25 (×7): 10 mg via ORAL
  Filled 2021-08-19 (×7): qty 1

## 2021-08-19 MED ORDER — SODIUM BICARBONATE/SODIUM CHLORIDE MOUTHWASH
OROMUCOSAL | Status: DC | PRN
  Filled 2021-08-19: qty 1000

## 2021-08-19 MED ORDER — PREDNISONE 20 MG PO TABS
60.0000 mg | ORAL_TABLET | Freq: Every day | ORAL | Status: AC
  Administered 2021-08-19 – 2021-08-23 (×5): 60 mg via ORAL
  Filled 2021-08-19 (×5): qty 3

## 2021-08-19 MED ORDER — MAGIC MOUTHWASH
5.0000 mL | Freq: Three times a day (TID) | ORAL | Status: DC | PRN
  Filled 2021-08-19: qty 5

## 2021-08-19 NOTE — Progress Notes (Addendum)
PROGRESS NOTE    Joe [name]  TKP:[id number] DOB: [birth year] DOA: 08/15/2021 PCP: Celene Squibb, MD    Brief Narrative:  Joe [name] was admitted to the hospital with the working diagnosis of syncope, related to large left mass due to lymphoma.     65 year old male past medical history for hypertension, obesity and obstructive sleep apnea who presented with recurrent episodes of syncope.  He has been recently diagnosed with follicular lymphoma.  Recent diagnosis of pulmonary embolism about 2 weeks ago.  He reported losing his consciousness after experiencing severe left-sided neck pain. On his initial physical examination blood pressure 128/73, heart rate 74, respiratory rate 18, temperature 97.6, oxygen saturation 97%.  His lungs  were clear to auscultation bilaterally, he had a large soft tissue mass in the left side of his neck, running from the angle of mandible to the posterior cervical spinous processes.  Heart S1-S2, present, rhythmic, soft abdomen, no lower extremity edema.   Sodium 136, potassium 4.2, chloride 98, bicarb 31, glucose 129, BUN 22, creatinine 1.17, white count 14.3, hemoglobin 14.9, hematocrit 44.4, platelets 300. SARS COVID-19 negative.   Chest radiograph no infiltrates.   Neck CT with significant interval increase in size of the large mass in the left neck which extends from the skull base superiorly into the upper mediastinum inferiorly.  The mass completely encases most of the left common carotid artery, and left internal carotid artery and much of the left external carotid artery.  Positive mass-effect on surrounding structures and moderate narrowing of the oropharyngeal airway.   EKG 70 bpm, left axis deviation, normal intervals, sinus rhythm, no significant ST segment T wave changes.   Syncope likely due to large left neck lymphoma with mass affect on vascular structures, on airway and provoking thoracic outlet obstruction. Requiring urgent treatment.    Hematology oncology was consulted and patient was started on urgent chemotherapy.      Assessment & Plan:   Principal Problem:   Recurrent syncope Active Problems:   Pulmonary embolism (HCC)   Follicular lymphoma (HCC)   Essential hypertension   OSA (obstructive sleep apnea)   Obesity, Class III, BMI 40-49.9 (morbid obesity) ([place])   Syncope   Encounter for antineoplastic chemotherapy   Left neck follicular lymphoma with chest outlet obstruction and mass effect on vascular and airway structures, complicated with syncope.  Patient is clinically responding well to chemotherapy with improvement in neck edema and dysphagia. No dyspnea.   Patient had one cycle of chemotherapy per protocol, per recommendations of Dr. Irene Limbo.   Uric acid today is 4,5. K is 4.2 Plan to continue with allopurinol for tumor lysis syndrome prophylaxis.    2. Pulmonary embolism.  Anticoagulation with enoxaparin    3. HTN/ dyslipidemia Today will resume antihypertensive treatment with amlodipine 10 mg daily.  Continue blood pressure monitoring.  On simvastatin    4. OSA. Obesity class 3 and depression.  Calculated BMI is 47,8 On sertraline.  Will order Cpap to use at night for sleep, patient will need eventually a sleep study as outpatient.    Patient continue to be at high risk for worsening mass effect and tumor lysis syndrome.   Status is: Inpatient   DVT prophylaxis: Enoxaparin   Code Status:   full  Family Communication:  No family at the bedside      Nutrition Status: Nutrition Problem: Increased nutrient needs Etiology: cancer and cancer related treatments Signs/Symptoms: estimated needs Interventions: Ensure Enlive (each supplement provides 350kcal and 20  grams of protein)    Consultants:  Oncology    Subjective: Patient is feeling better, decrease mass sensation on his left neck, no dyspnea and improved dysphagia. No chest pain.   Objective: Vitals:   08/18/21 1356 08/18/21  2140 08/19/21 0500 08/19/21 0521  BP: (!) 145/87 130/83  (!) 182/85  Pulse: 87 93  84  Resp: 20 14  19   Temp: 97.7 F (36.5 C) 98.1 F (36.7 C)  98.3 F (36.8 C)  TempSrc: Oral Oral  Oral  SpO2: 95% 92%  (!) 88%  Weight:   (!) 147.5 kg   Height:       No intake or output data in the 24 hours ending 08/19/21 0853 Filed Weights   08/17/21 1426 08/18/21 0630 08/19/21 0500  Weight: (!) 143 kg (!) 147 kg (!) 147.5 kg    Examination:   General: Not in pain or dyspnea/  Neurology: Awake and alert, non focal  E ENT: no pallor, no icterus, oral mucosa moist, positive large mass at the base of the left neck.  Cardiovascular: No JVD. S1-S2 present, rhythmic, no gallops, rubs, or murmurs. No lower extremity edema. Pulmonary: positive breath sounds bilaterally, adequate air movement, no wheezing, rhonchi or rales. Gastrointestinal. Abdomen soft and non tender Skin. No rashes Musculoskeletal: no joint deformities     Data Reviewed: I have personally reviewed following labs and imaging studies  CBC: Recent Labs  Lab 08/15/21 1353 08/16/21 0541 08/17/21 0453 08/18/21 0603 08/19/21 0540  WBC 14.3* 13.7* 10.9* 8.1 9.1  NEUTROABS 11.7* 10.2* 8.0* 5.7 8.0*  HGB 14.9 13.7 13.8 13.5 13.4  HCT 44.4 41.5 41.4 40.8 40.3  MCV 92.7 91.6 91.6 91.7 92.0  PLT 300 264 248 229 638   Basic Metabolic Panel: Recent Labs  Lab 08/15/21 1353 08/16/21 0541 08/19/21 0540  NA 136 137 135  K 4.2 3.8 4.2  CL 98 102 100  CO2 31 28 28   GLUCOSE 129* 88 125*  BUN 22 16 18   CREATININE 1.17 0.90 0.83  CALCIUM 8.8* 8.4* 8.5*  MG  --  2.0  --    GFR: Estimated Creatinine Clearance: 130.7 mL/min (by C-G formula based on SCr of 0.83 mg/dL). Liver Function Tests: Recent Labs  Lab 08/15/21 1353 08/16/21 0541 08/19/21 0540  AST 27 27 53*  ALT 46* 47* 76*  ALKPHOS 65 57 58  BILITOT 0.6 0.8 0.8  PROT 7.4 6.9 7.2  ALBUMIN 3.7 3.4* 3.4*   No results for input(s): LIPASE, AMYLASE in the last 168  hours. No results for input(s): AMMONIA in the last 168 hours. Coagulation Profile: No results for input(s): INR, PROTIME in the last 168 hours. Cardiac Enzymes: No results for input(s): CKTOTAL, CKMB, CKMBINDEX, TROPONINI in the last 168 hours. BNP (last 3 results) No results for input(s): PROBNP in the last 8760 hours. HbA1C: No results for input(s): HGBA1C in the last 72 hours. CBG: No results for input(s): GLUCAP in the last 168 hours. Lipid Profile: No results for input(s): CHOL, HDL, LDLCALC, TRIG, CHOLHDL, LDLDIRECT in the last 72 hours. Thyroid Function Tests: No results for input(s): TSH, T4TOTAL, FREET4, T3FREE, THYROIDAB in the last 72 hours. Anemia Panel: No results for input(s): VITAMINB12, FOLATE, FERRITIN, TIBC, IRON, RETICCTPCT in the last 72 hours.    Radiology Studies: I have reviewed all of the imaging during this hospital visit personally     Scheduled Meds:  allopurinol  300 mg Oral Daily   Chlorhexidine Gluconate Cloth  6 each Topical  Daily   enoxaparin (LOVENOX) injection  1 mg/kg Subcutaneous Q12H   feeding supplement  237 mL Oral BID BM   sertraline  50 mg Oral Daily   simvastatin  40 mg Oral Once per day on Mon Wed Fri   sodium chloride flush  10-40 mL Intracatheter Q12H   Continuous Infusions:   LOS: 3 days        Donnie Panik Gerome Apley, MD

## 2021-08-19 NOTE — Progress Notes (Signed)
Pt wishes to wear nasal cannula and sleep with his bed elevated rather than risking a CPAP strap compressing his mass while wearing CPAP.  Respiratory agreeable with this plan.

## 2021-08-19 NOTE — Progress Notes (Signed)
Oncology brief note   I got a call from [place] regarding his prednisone and granix. I reviewed Dr. Grier Mitts note and ordered prednisone 60mg  daily for 5 days, starting today. Dr. Irene Limbo will order and start him on Granix tomorrow.  Truitt Merle  08/19/2021

## 2021-08-20 DIAGNOSIS — I2690 Septic pulmonary embolism without acute cor pulmonale: Secondary | ICD-10-CM

## 2021-08-20 LAB — COMPREHENSIVE METABOLIC PANEL
ALT: 108 U/L — ABNORMAL HIGH (ref 0–44)
AST: 58 U/L — ABNORMAL HIGH (ref 15–41)
Albumin: 3.2 g/dL — ABNORMAL LOW (ref 3.5–5.0)
Alkaline Phosphatase: 63 U/L (ref 38–126)
Anion gap: 4 — ABNORMAL LOW (ref 5–15)
BUN: 20 mg/dL (ref 8–23)
CO2: 31 mmol/L (ref 22–32)
Calcium: 8.8 mg/dL — ABNORMAL LOW (ref 8.9–10.3)
Chloride: 101 mmol/L (ref 98–111)
Creatinine, Ser: 0.88 mg/dL (ref 0.61–1.24)
GFR, Estimated: >60 mL/min (ref >60)
Glucose, Bld: 113 mg/dL — ABNORMAL HIGH (ref 70–99)
Potassium: 4.3 mmol/L (ref 3.5–5.1)
Sodium: 136 mmol/L (ref 135–145)
Total Bilirubin: 0.3 mg/dL (ref 0.3–1.2)
Total Protein: 6.9 g/dL (ref 6.5–8.1)

## 2021-08-20 LAB — CBC WITH DIFFERENTIAL/PLATELET
Abs Immature Granulocytes: 0.03 10*3/uL (ref 0.00–0.07)
Basophils Absolute: 0 10*3/uL (ref 0.0–0.1)
Basophils Relative: 0 %
Eosinophils Absolute: 0 10*3/uL (ref 0.0–0.5)
Eosinophils Relative: 0 %
HCT: 39.6 % (ref 39.0–52.0)
Hemoglobin: 13.2 g/dL (ref 13.0–17.0)
Immature Granulocytes: 0 %
Lymphocytes Relative: 8 %
Lymphs Abs: 0.8 10*3/uL (ref 0.7–4.0)
MCH: 30.4 pg (ref 26.0–34.0)
MCHC: 33.3 g/dL (ref 30.0–36.0)
MCV: 91.2 fL (ref 80.0–100.0)
Monocytes Absolute: 0.9 10*3/uL (ref 0.1–1.0)
Monocytes Relative: 8 %
Neutro Abs: 8.5 10*3/uL — ABNORMAL HIGH (ref 1.7–7.7)
Neutrophils Relative %: 84 %
Platelets: 213 10*3/uL (ref 150–400)
RBC: 4.34 MIL/uL (ref 4.22–5.81)
RDW: 14.1 % (ref 11.5–15.5)
WBC: 10.2 10*3/uL (ref 4.0–10.5)
nRBC: 0 % (ref 0.0–0.2)

## 2021-08-20 LAB — URIC ACID: Uric Acid, Serum: 4.3 mg/dL (ref 3.7–8.6)

## 2021-08-20 LAB — HEPARIN ANTI-XA: Heparin LMW: 1.48 IU/mL

## 2021-08-20 MED ORDER — ENOXAPARIN SODIUM 150 MG/ML IJ SOSY: 150.0000 mg | PREFILLED_SYRINGE | Freq: Two times a day (BID) | INTRAMUSCULAR | Status: DC

## 2021-08-20 MED ORDER — SODIUM CHLORIDE 0.9 % IV SOLN
Freq: Once | INTRAVENOUS | Status: AC
Start: 2021-08-20 — End: 2021-08-20

## 2021-08-20 MED ORDER — ENOXAPARIN SODIUM 120 MG/0.8ML IJ SOSY
120.0000 mg | PREFILLED_SYRINGE | Freq: Two times a day (BID) | INTRAMUSCULAR | Status: DC
  Administered 2021-08-20 – 2021-08-25 (×10): 120 mg via SUBCUTANEOUS
  Filled 2021-08-20 (×11): qty 0.8

## 2021-08-20 MED ORDER — DIPHENHYDRAMINE HCL 25 MG PO CAPS
50.0000 mg | ORAL_CAPSULE | Freq: Once | ORAL | Status: AC
  Administered 2021-08-20: 50 mg via ORAL
  Filled 2021-08-20: qty 2

## 2021-08-20 MED ORDER — ACETAMINOPHEN 325 MG PO TABS
650.0000 mg | ORAL_TABLET | Freq: Once | ORAL | Status: AC
  Administered 2021-08-20: 650 mg via ORAL
  Filled 2021-08-20: qty 2

## 2021-08-20 MED ORDER — SODIUM CHLORIDE 0.9 % IV SOLN
375.0000 mg/m2 | Freq: Once | INTRAVENOUS | Status: AC
  Administered 2021-08-20: 1000 mg via INTRAVENOUS
  Filled 2021-08-20: qty 100

## 2021-08-20 MED ORDER — TBO-FILGRASTIM 480 MCG/0.8ML ~~LOC~~ SOSY
480.0000 ug | PREFILLED_SYRINGE | Freq: Every day | SUBCUTANEOUS | Status: DC
  Administered 2021-08-20 – 2021-08-23 (×4): 480 ug via SUBCUTANEOUS
  Filled 2021-08-20 (×4): qty 0.8

## 2021-08-20 NOTE — Progress Notes (Signed)
ANTICOAGULATION CONSULT NOTE  Pharmacy Consult for enoxaparin Indication: pulmonary embolus  No Known Allergies  Patient Measurements: Height: 5\' 9"  (175.3 cm) Weight: (!) 144.6 kg (318 lb 12.6 oz) IBW/kg (Calculated) : 70.7  Labs: Recent Labs    08/18/21 0603 08/19/21 0540 08/20/21 0618 08/20/21 1033  HGB 13.5 13.4 13.2  --   HCT 40.8 40.3 39.6  --   PLT 229 242 213  --   HEPRLOWMOCWT  --   --   --  1.48  CREATININE  --  0.83 0.88  --     Estimated Creatinine Clearance: 121.9 mL/min (by C-G formula based on SCr of 0.88 mg/dL).  Assessment: 65 yo M on Eliquis PTA for recent PE. Now transitioned to LMWH treatment per onc. LMWH level is supratherapeutic today at 1.48. CBC stable  Goal of Therapy:  Anti-Xa level 0.6-1 units/ml 4hrs after LMWH dose given Monitor platelets by anticoagulation protocol: Yes   Plan:  Decrease enoxaparin to 120mg  [place] Q12h Monitor CBC, s/s of bleed  Elenor Quinones, PharmD, BCPS, BCIDP Clinical Pharmacist 08/20/2021 12:08 PM

## 2021-08-20 NOTE — Progress Notes (Signed)
RN went to bump Rituxan to 200 mg and pt c/o chills and stated tumor on L neck felt warm and tingly. VSS. Erasmo Downer, NP notified. Remainder of Rituxan infused at 901 mg without complications.

## 2021-08-20 NOTE — Progress Notes (Addendum)
HEMATOLOGY-ONCOLOGY PROGRESS NOTE  DOS 08/20/2021  SUBJECTIVE:   Joe [name] seen this morning for follow-up.  He received his first cycle of CHOP on 08/18/2021.  Overall, he tolerated it well.  He feels that his left neck mass has less pressure and he is also having less pain to the left side of his neck.  He is not having any nausea or vomiting this morning.  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  PHYSICAL EXAMINATION: ECOG PERFORMANCE STATUS: 1 - Symptomatic but completely ambulatory  Vitals:   08/19/21 1413 08/19/21 2043  BP: (!) 147/77 124/83  Pulse: 94 94  Resp: 18 19  Temp:  (!) 97.5 F (36.4 C)  SpO2: 95% 97%   Filed Weights   08/19/21 0500 08/19/21 1657 08/20/21 0500  Weight: (!) 147.5 kg (!) 143.4 kg (!) 144.6 kg    Intake/Output from previous day: 10/30 0701 - 10/31 0700 In: 1200 [P.O.:1200] Out: -  . GENERAL:alert, in no acute distress and comfortable SKIN: no acute rashes, no significant lesions OROPHARYNX: MMM, no thrush no ulcers NECK: Large left neck mass extending into the superior mediastinum obliterating the supra sternal notch. LUNGS: clear to auscultation b/l with normal respiratory effort HEART: regular rate & rhythm ABDOMEN:  normoactive bowel sounds , non tender, not distended. Extremity: 1+ pedal edema PSYCH: alert & oriented x 3 with fluent speech NEURO: no focal motor/sensory deficits   LABORATORY DATA:  I have reviewed the data as listed CMP Latest Ref Rng & Units 08/20/2021 08/19/2021 08/16/2021  Glucose 70 - 99 mg/dL 113(H) 125(H) 88  BUN 8 - 23 mg/dL _0 Creatinine 0.61 - 1.24 mg/dL 0.88 0.83 0.90  Sodium 135 - 145 mmol/L 136 135 137  Potassium 3.5 - 5.1 mmol/L 4.3 4.2  3.8  Chloride 98 - 111 mmol/L 101 100 102  CO2 22 - 32 mmol/L _1 Calcium 8.9 - 10.3 mg/dL 8.8(L) 8.5(L) 8.4(L)  Total Protein 6.5 - 8.1 g/dL 6.9 7.2 6.9  Total Bilirubin 0.3 - 1.2 mg/dL 0.3 0.8 0.8  Alkaline Phos 38 - 126 U/L 63 58 57  AST 15 - 41 U/L 58(H) 53(H) 27  ALT 0 - 44 U/L 108(H) 76(H) 47(H)    Lab Results  Component Value Date   WBC 10.2 08/20/2021   HGB 13.2 08/20/2021   HCT 39.6 08/20/2021   MCV 91.2 08/20/2021   PLT 213 08/20/2021   NEUTROABS 8.5 (H) 08/20/2021    CT Angio Head W or Wo Contrast  Result Date: 08/15/2021 CLINICAL DATA:  Syncope and hypoxia, large neck mass due to non-Hodgkin's lymphoma EXAM: CT ANGIOGRAPHY HEAD AND NECK TECHNIQUE: Multidetector CT imaging of the head and neck was performed using the standard protocol during bolus administration of intravenous contrast. Multiplanar CT image reconstructions and MIPs were obtained to evaluate the vascular anatomy. Carotid stenosis measurements (when applicable) are obtained utilizing NASCET criteria, using the distal internal carotid diameter as the denominator. CONTRAST:  120m OMNIPAQUE IOHEXOL 350 MG/ML SOLN COMPARISON:  CT neck 07/09/2021 FINDINGS: CT HEAD FINDINGS Brain: There is no acute intracranial hemorrhage, extra-axial fluid collection, or acute infarct. The ventricles are normal in size. There is no mass lesion.  There is no midline shift. Vascular: No hyperdense vessel or unexpected calcification. The vasculature is assessed info below Skull: Normal. Negative for fracture or focal lesion. Sinuses: There is moderate mucosal thickening with layering fluid in the right maxillary sinus. There is surrounding hyperostosis consistent with chronic maxillary sinusitis. Orbits: The globes and orbits are unremarkable. Review of the MIP images confirms the above findings CTA NECK FINDINGS Aortic arch: The aortic arch is unremarkable. Right carotid system: The right common, internal, and external carotid  arteries are patent with no significant stenosis, dissection, or aneurysm. Left carotid system: The proximal left common carotid artery is unremarkable. There is complete encasement of the common carotid artery from the level of the clavicular head through the bifurcation with complete encasement of the internal and external carotid arteries. The internal carotid artery is encased from the bifurcation through the petrous segment. The distal branches of the external carotid artery are not encased. There is no significant narrowing of the left carotid system. There is no evidence of dissection or aneurysm. There is minimal calcified atherosclerotic plaque in the carotid bulb. Vertebral arteries: The vertebral arteries are patent, with no significant stenosis, dissection, or aneurysm. Skeleton: There is multilevel degenerative change of the cervical spine. There is no acute osseous abnormality or aggressive osseous lesion. Other neck: There is a large infiltrative soft tissue mass throughout the soft tissues of the left neck. The mass measures up to approximately 8.9 cm AP by 9.3 cm TV by 13.8 cm cc, increased in size since 07/09/2021 when it measured approximately 6.6 cm TV by 6.0 cm AP by 10.0 cm cc, measured again using similar technique. Inferiorly, the mass extends into the upper mediastinum just posterior to the clavicle. The mass is inseparable from the sternocleidomastoid. As above, there is complete encasement of the carotid vasculature without narrowing of the arteries. The airway is shifted to the right and moderately narrowed at the level of the oropharynx. Upper chest: The lungs are assessed on the separately dictated CT chest. Review of the MIP images confirms the above findings CTA HEAD FINDINGS Anterior circulation: The bilateral intracranial ICAs are patent. The bilateral MCAs are patent The bilateral ACAs are patent. There is no aneurysm. Posterior circulation: The bilateral V4 segments are patent.  The basilar artery is patent. The bilateral PCAs are patent. The posterior communicating arteries are not definitely identified. Venous sinuses: There is a hypodense filling defect in the right sigmoid sinus. Anatomic variants: As above. Review of the MIP images confirms the above findings IMPRESSION: Since 07/09/2021: 1. Significant interval increase in size of the large mass in the left neck which extends from the skull base superiorly into the upper mediastinum inferiorly. The mass completely encases most of the left common carotid artery, the entire left internal carotid artery from the bifurcation to the skull base, and much of the left external carotid artery and branches. Despite the encasement, there is no significant narrowing of the left carotid system. The mass results in significant mass effect on surrounding structures and moderate narrowing of the oropharyngeal airway. 2. Otherwise, unremarkable vasculature of the head and neck. 3. Hypodense filling defect in the right sigmoid sinus concerning for nonocclusive thrombus. 4. Chronic right maxillary sinusitis. Electronically Signed   By: Valetta Mole M.D.   On: 08/15/2021 19:24   DG Chest 2 View  Result Date: 08/15/2021 CLINICAL DATA:  Hypoxia.  Lymphoma. EXAM: CHEST - 2 VIEW COMPARISON:  07/31/2021 FINDINGS: Right arm PICC tip in the SVC 4 cm  above the right atrium. Heart size is normal. The lungs are clear. No effusions. Left neck mass demonstrated with displacement of the trachea towards the right. IMPRESSION: No active cardiopulmonary disease. PICC tip in the SVC 4 cm above the right atrium. Known left neck mass. Electronically Signed   By: Nelson Chimes M.D.   On: 08/15/2021 14:43   DG Chest 2 View  Result Date: 07/28/2021 CLINICAL DATA:  Seizure-like activity. EXAM: CHEST - 2 VIEW COMPARISON:  June 08, 2019 FINDINGS: The heart size and mediastinal contours are within normal limits. Both lungs are clear. The visualized skeletal structures  are unremarkable. IMPRESSION: No active cardiopulmonary disease. Electronically Signed   By: Fidela Salisbury M.D.   On: 07/28/2021 14:51   CT Head Wo Contrast  Result Date: 07/28/2021 CLINICAL DATA:  Seizure activity, frontal scalp abrasion EXAM: CT HEAD WITHOUT CONTRAST TECHNIQUE: Contiguous axial images were obtained from the base of the skull through the vertex without intravenous contrast. COMPARISON:  None. FINDINGS: Brain: No acute infarct or hemorrhage. Lateral ventricles and midline structures are unremarkable. No acute extra-axial fluid collections. No mass effect. Vascular: No hyperdense vessel or unexpected calcification. Skull: Normal. Negative for fracture or focal lesion. Sinuses/Orbits: Mild mucosal thickening within the maxillary sinuses. Remaining paranasal sinuses are clear. Other: None. IMPRESSION: 1. No acute intracranial process. Electronically Signed   By: Randa Ngo M.D.   On: 07/28/2021 18:39   CT Angio Neck W and/or Wo Contrast  Result Date: 08/15/2021 CLINICAL DATA:  Syncope and hypoxia, large neck mass due to non-Hodgkin's lymphoma EXAM: CT ANGIOGRAPHY HEAD AND NECK TECHNIQUE: Multidetector CT imaging of the head and neck was performed using the standard protocol during bolus administration of intravenous contrast. Multiplanar CT image reconstructions and MIPs were obtained to evaluate the vascular anatomy. Carotid stenosis measurements (when applicable) are obtained utilizing NASCET criteria, using the distal internal carotid diameter as the denominator. CONTRAST:  139mL OMNIPAQUE IOHEXOL 350 MG/ML SOLN COMPARISON:  CT neck 07/09/2021 FINDINGS: CT HEAD FINDINGS Brain: There is no acute intracranial hemorrhage, extra-axial fluid collection, or acute infarct. The ventricles are normal in size. There is no mass lesion. There is no midline shift. Vascular: No hyperdense vessel or unexpected calcification. The vasculature is assessed info below Skull: Normal. Negative for  fracture or focal lesion. Sinuses: There is moderate mucosal thickening with layering fluid in the right maxillary sinus. There is surrounding hyperostosis consistent with chronic maxillary sinusitis. Orbits: The globes and orbits are unremarkable. Review of the MIP images confirms the above findings CTA NECK FINDINGS Aortic arch: The aortic arch is unremarkable. Right carotid system: The right common, internal, and external carotid arteries are patent with no significant stenosis, dissection, or aneurysm. Left carotid system: The proximal left common carotid artery is unremarkable. There is complete encasement of the common carotid artery from the level of the clavicular head through the bifurcation with complete encasement of the internal and external carotid arteries. The internal carotid artery is encased from the bifurcation through the petrous segment. The distal branches of the external carotid artery are not encased. There is no significant narrowing of the left carotid system. There is no evidence of dissection or aneurysm. There is minimal calcified atherosclerotic plaque in the carotid bulb. Vertebral arteries: The vertebral arteries are patent, with no significant stenosis, dissection, or aneurysm. Skeleton: There is multilevel degenerative change of the cervical spine. There is no acute osseous abnormality or aggressive osseous lesion. Other neck: There is a large infiltrative soft tissue mass  throughout the soft tissues of the left neck. The mass measures up to approximately 8.9 cm AP by 9.3 cm TV by 13.8 cm cc, increased in size since 07/09/2021 when it measured approximately 6.6 cm TV by 6.0 cm AP by 10.0 cm cc, measured again using similar technique. Inferiorly, the mass extends into the upper mediastinum just posterior to the clavicle. The mass is inseparable from the sternocleidomastoid. As above, there is complete encasement of the carotid vasculature without narrowing of the arteries. The airway  is shifted to the right and moderately narrowed at the level of the oropharynx. Upper chest: The lungs are assessed on the separately dictated CT chest. Review of the MIP images confirms the above findings CTA HEAD FINDINGS Anterior circulation: The bilateral intracranial ICAs are patent. The bilateral MCAs are patent The bilateral ACAs are patent. There is no aneurysm. Posterior circulation: The bilateral V4 segments are patent. The basilar artery is patent. The bilateral PCAs are patent. The posterior communicating arteries are not definitely identified. Venous sinuses: There is a hypodense filling defect in the right sigmoid sinus. Anatomic variants: As above. Review of the MIP images confirms the above findings IMPRESSION: Since 07/09/2021: 1. Significant interval increase in size of the large mass in the left neck which extends from the skull base superiorly into the upper mediastinum inferiorly. The mass completely encases most of the left common carotid artery, the entire left internal carotid artery from the bifurcation to the skull base, and much of the left external carotid artery and branches. Despite the encasement, there is no significant narrowing of the left carotid system. The mass results in significant mass effect on surrounding structures and moderate narrowing of the oropharyngeal airway. 2. Otherwise, unremarkable vasculature of the head and neck. 3. Hypodense filling defect in the right sigmoid sinus concerning for nonocclusive thrombus. 4. Chronic right maxillary sinusitis. Electronically Signed   By: Valetta Mole M.D.   On: 08/15/2021 19:24   CT Angio Chest PE W and/or Wo Contrast  Result Date: 08/15/2021 CLINICAL DATA:  Concern for pulmonary embolism. EXAM: CT ANGIOGRAPHY CHEST WITH CONTRAST TECHNIQUE: Multidetector CT imaging of the chest was performed using the standard protocol during bolus administration of intravenous contrast. Multiplanar CT image reconstructions and MIPs were  obtained to evaluate the vascular anatomy. CONTRAST:  173m OMNIPAQUE IOHEXOL 350 MG/ML SOLN COMPARISON:  Chest CT dated 07/31/2021. FINDINGS: Cardiovascular: There is no cardiomegaly or pericardial effusion. The thoracic aorta is unremarkable. Evaluation of the pulmonary arteries is limited due to respiratory motion artifact and suboptimal visualization of the peripheral branches. No large or central pulmonary artery embolus identified. Right sided PICC now extends superiorly into the right IJ. The PICC appears to bend in the right IJ and turns downward. The tip of the PICC appears to be in the right IJ at the level of the subclavian vein (33/6 and coronal 91/7). Evaluation however is limited due to presence of intravenous contrast. Mediastinum/Nodes: No hilar or mediastinal adenopathy. The esophagus is grossly unremarkable. Partially visualized mass in the left side of the neck measures 9.1 x 8.4 cm and increase in size since the study of 07/09/2021 (previously measuring approximately 7.1 x 6.0 cm). There is associated mass effect and deviation of the trachea to the right. No mediastinal fluid collection. Lungs/Pleura: No focal consolidation, pleural effusion, or pneumothorax. The central airways are patent. Upper Abdomen: No acute abnormality. Musculoskeletal: Degenerative changes of the spine. No acute osseous pathology. Review of the MIP images confirms the above findings.  IMPRESSION: 1. No CT evidence of central pulmonary artery embolus. 2. Right sided PICC now extends superiorly into the right IJ. The tip of the PICC appears to be in the right IJ at the level of the subclavian vein. Evaluation however is limited due to presence of intravenous contrast. 3. Partially visualized mass in the left side of the neck has increased in size since the study of 07/09/2021 and is concerning for malignancy. These results were called by telephone at the time of interpretation on 08/15/2021 at 7:39 pm to Dr. Gilford Raid, who  verbally acknowledged these results. Electronically Signed   By: Anner Crete M.D.   On: 08/15/2021 19:49   CT Angio Chest PE W and/or Wo Contrast  Addendum Date: 07/31/2021   ADDENDUM REPORT: 07/31/2021 19:50 ADDENDUM: These results were called by telephone at the time of interpretation on 07/31/2021 at 7:48 pm to provider JOSEPH ZAMMIT , who verbally acknowledged these results. Electronically Signed   By: Iven Finn M.D.   On: 07/31/2021 19:50   Result Date: 07/31/2021 CLINICAL DATA:  PE suspected, low/intermediate prob, neg D-dimer EXAM: CT ANGIOGRAPHY CHEST WITH CONTRAST TECHNIQUE: Multidetector CT imaging of the chest was performed using the standard protocol during bolus administration of intravenous contrast. Multiplanar CT image reconstructions and MIPs were obtained to evaluate the vascular anatomy. CONTRAST:  170m OMNIPAQUE IOHEXOL 350 MG/ML SOLN COMPARISON:  None. FINDINGS: Cardiovascular: Satisfactory opacification of the pulmonary arteries to the segmental level. Segmental and subsegmental right lower lobe pulmonary embolus. Segmental and subsegmental right middle lobe pulmonary embolus. Limited evaluation of left lower lobe with question subsegmental pulmonary emboli. Normal heart size. No pericardial effusion. Mediastinum/Nodes: No enlarged mediastinal, hilar, or axillary lymph nodes. Thyroid gland, trachea, and esophagus demonstrate no significant findings. Lungs/Pleura: Limited evaluation due to motion artifact. Expiratory phase of respiration. Mosaic attenuation of the lungs. No focal consolidation. No pulmonary nodule. No pulmonary mass. No pleural effusion. No pneumothorax. Upper Abdomen: Possible mild hepatic steatosis. Musculoskeletal: No chest wall abnormality. No suspicious lytic or blastic osseous lesions. No acute displaced fracture. Multilevel degenerative changes of the spine. Review of the MIP images confirms the above findings. IMPRESSION: Segmental and subsegmental  right lower and middle lobe pulmonary emboli. Question left lower lobe subsegmental pulmonary emboli. No findings of right heart strain or pulmonary infarction. Electronically Signed: By: MIven FinnM.D. 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 EXAM: NUCLEAR MEDICINE PET SKULL BASE TO THIGH TECHNIQUE: 16.9 mCi F-18 FDG was injected intravenously. Full-ring PET imaging was performed from the skull base to thigh after the radiotracer. CT data was obtained and used for attenuation correction and anatomic localization. Fasting blood glucose: 121 mg/dl COMPARISON:  None FINDINGS: Mediastinal blood pool activity: SUV max 3.78 Liver activity: SUV max 4.33 NECK: There is a large nodal mass within the left cervical chain extending from the level 2 region through level 4 and 5. This measures the 9.6 x 8.5 by 13.9 cm and has an SUV max 26.47, image 43/4. The tumor extends into the superior mediastinum along with the left paratracheal region, image 56/4. There is also tumor extension into the prevertebral soft tissue space at the level of the oropharynx with involvement of the right tonsillar fossa. Tumor also extends into the prevertebral soft tissues at the level of the hypopharynx with mass effect upon the hypopharynx with deviation towards the right. Incidental CT  findings: none CHEST: Mild FDG uptake associated with 9 mm subcarinal lymph node within SUV max of 3.68, image 83/4. No FDG avid pulmonary nodule. Incidental CT findings: Mild aortic atherosclerosis and coronary artery calcifications. No pleural effusion. ABDOMEN/PELVIS: No abnormal FDG uptake identified within the liver. Within the neck of pancreas there is a approximately 1.4 cm focus of increased uptake with SUV max of 7.88. Within the body of the pancreas there is a focus of increased uptake measuring 2 cm with SUV max  of 14.0. Normal size spleen without focal areas of increased uptake. Adrenal glands appear normal. No FDG avid lymph nodes within the abdomen or pelvis. FDG avid right inguinal node measures 7 mm within SUV max of 6.96, image 199/4. Incidental CT findings: Large fat containing umbilical hernia measures 8.0 x 6.3 cm. Small bilateral fat containing inguinal hernias. SKELETON: There are a few scattered FDG avid bone lesions, without corresponding CT changes. For example, within the left humeral head there is a moderate to intense focus of increased uptake within SUV max of 9.87. Within the transverse process the T10 vertebra on the left there is a focus of increased uptake within SUV max of 8.85. Within the inferior endplate of the L2 vertebral body there is a small focus of increased uptake with SUV max of 8.27. Incidental CT findings: none IMPRESSION: 1. Examination is positive for large FDG avid mass within the left neck extending from the level 2 nodal chain into the left side of the superior mediastinum compatible with metabolically active tumor. 2. There are 2 foci of increased uptake within the neck and body of pancreas lymphomatous involvement of the pancreas. 3. Subcentimeter FDG avid right inguinal lymph node also concerning for lymphoma. 4. Several foci of increased uptake within the axial skeleton compatible with malignancy. Electronically Signed   By: Kerby Moors M.D.   On: 08/14/2021 06:22   US Venous Img Lower Bilateral (DVT)  Result Date: 08/01/2021 CLINICAL DATA:  Pulmonary emboli on CTA chest 07/31/2021. Lower extremity edema. EXAM: Bilateral LOWER EXTREMITY VENOUS DOPPLER ULTRASOUND TECHNIQUE: Gray-scale sonography with compression, as well as color and duplex ultrasound, were performed to evaluate the deep venous system(s) from the level of the common femoral vein through the popliteal and proximal calf veins. COMPARISON:  None. FINDINGS: VENOUS Left side: Substantial thrombus in the  superficial femoral vein and profundus femoral vein on the left, with no significant compressibility, partially occlusive but not completely occlusive. This extends into the posterior tibial veins and peroneal veins as well as the popliteal vein. The common femoral vein appears patent. Right side: Normal appearance of the common femoral vein, superficial femoral vein, popliteal vein, and calf veins on the right. Expected compressibility and augmentation. OTHER None. Limitations: none IMPRESSION: 1. Positive for left-sided DVT with left superficial femoral vein, profundus femoral vein, popliteal vein, and calf vein thrombosis on the left side, in this patient with recent diagnosis of acute pulmonary embolus. No findings of DVT on the right side. Electronically Signed   By: Van Clines M.D.   On: 08/01/2021 15:50   DG Chest Port 1 View  Result Date: 07/31/2021 CLINICAL DATA:  Wheezing, history of COVID positive. EXAM: PORTABLE CHEST 1 VIEW COMPARISON:  July 28, 2021 FINDINGS: The heart size and mediastinal contours are within normal limits. Both lungs are clear. The visualized skeletal structures are unremarkable. IMPRESSION: No active disease. Electronically Signed   By: Virgina Norfolk M.D.   On: 07/31/2021 16:22   ECHOCARDIOGRAM COMPLETE  Result Date: 08/01/2021    ECHOCARDIOGRAM REPORT   Patient Name:   Joe [name] Date of Exam: 08/01/2021 Medical Rec #:  [id number]       Height:       69.0 in Accession #:    [id number]      Weight:       323.4 lb Date of Birth:  [birth year]       BSA:          2.534 m Patient Age:    65 years        BP:           149/82 mmHg Patient Gender: M               HR:           95 bpm. Exam Location:  [facility] Procedure: 2D Echo, Cardiac Doppler and Color Doppler Indications:    Pulmonary Embolus I26.09  History:        Patient has no prior history of Echocardiogram examinations.                 Risk Factors:Hypertension and Dyslipidemia. Obesity. OSA                  (obstructive sleep apnea). Covid-19 infection. Cancer ([place])                 (From Hx).  Sonographer:    Alvino Chapel RCS Referring Phys: [id number] [place]  1. Left ventricular ejection fraction, by estimation, is 60 to 65%. The left ventricle has normal function. The left ventricle has no regional wall motion abnormalities. Left ventricular diastolic parameters were normal.  2. Right ventricular systolic function is normal. The right ventricular size is normal. Tricuspid regurgitation signal is inadequate for assessing PA pressure.  3. The mitral valve is grossly normal. No evidence of mitral valve regurgitation. No evidence of mitral stenosis.  4. The aortic valve is tricuspid. Aortic valve regurgitation is not visualized. No aortic stenosis is present.  5. The inferior vena cava is normal in size with greater than 50% respiratory variability, suggesting right atrial pressure of 3 mmHg. Conclusion(s)/Recommendation(s): Normal biventricular function without evidence of hemodynamically significant valvular heart disease. FINDINGS  Left Ventricle: Left ventricular ejection fraction, by estimation, is 60 to 65%. The left ventricle has normal function. The left ventricle has no regional wall motion abnormalities. The left ventricular internal cavity size was normal in size. There is  no left ventricular hypertrophy. Left ventricular diastolic parameters were normal. Right Ventricle: The right ventricular size is normal. No increase in right ventricular wall thickness. Right ventricular systolic function is normal. Tricuspid regurgitation signal is inadequate for assessing PA pressure. Left Atrium: Left atrial size was normal in size. Right Atrium: Right atrial size was normal in size. Pericardium: Trivial pericardial effusion is present. Presence of pericardial fat pad. Mitral Valve: The mitral valve is grossly normal. No evidence of mitral valve regurgitation. No evidence of mitral valve  stenosis. Tricuspid Valve: The tricuspid valve is grossly normal. Tricuspid valve regurgitation is trivial. No evidence of tricuspid stenosis. Aortic Valve: The aortic valve is tricuspid. Aortic valve regurgitation is not visualized. No aortic stenosis is present. Aortic valve mean gradient measures 8.0 mmHg. Aortic valve peak gradient measures 16.5 mmHg. Aortic valve area, by VTI measures 2.63  cm. Pulmonic Valve: The pulmonic valve was grossly normal. Pulmonic valve regurgitation is not visualized. No evidence of pulmonic stenosis. Aorta: The aortic root is  normal in size and structure. Venous: The right lower pulmonary vein is normal. The inferior vena cava is normal in size with greater than 50% respiratory variability, suggesting right atrial pressure of 3 mmHg. IAS/Shunts: The atrial septum is grossly normal.  LEFT VENTRICLE PLAX 2D LVIDd:         4.50 cm   Diastology LVIDs:         2.70 cm   LV e' medial:    11.10 cm/s LV PW:         1.10 cm   LV E/e' medial:  7.6 LV IVS:        1.10 cm   LV e' lateral:   12.30 cm/s LVOT diam:     2.00 cm   LV E/e' lateral: 6.8 LV SV:         97 LV SV Index:   38 LVOT Area:     3.14 cm  RIGHT VENTRICLE RV S prime:     21.10 cm/s TAPSE (M-mode): 3.2 cm LEFT ATRIUM             Index        RIGHT ATRIUM           Index LA diam:        3.60 cm 1.42 cm/m   RA Area:     11.70 cm LA Vol (A2C):   33.8 ml 13.34 ml/m  RA Volume:   26.20 ml  10.34 ml/m LA Vol (A4C):   48.7 ml 19.22 ml/m LA Biplane Vol: 42.7 ml 16.85 ml/m  AORTIC VALVE AV Area (Vmax):    2.62 cm AV Area (Vmean):   2.82 cm AV Area (VTI):     2.63 cm AV Vmax:           203.00 cm/s AV Vmean:          135.000 cm/s AV VTI:            0.371 m AV Peak Grad:      16.5 mmHg AV Mean Grad:      8.0 mmHg LVOT Vmax:         169.00 cm/s LVOT Vmean:        121.000 cm/s LVOT VTI:          0.310 m LVOT/AV VTI ratio: 0.84  AORTA Ao Root diam: 3.40 cm MITRAL VALVE MV Area (PHT): 2.83 cm     SHUNTS MV Decel Time: 268 msec      Systemic VTI:  0.31 m MV E velocity: 83.90 cm/s   Systemic Diam: 2.00 cm MV A velocity: 112.00 cm/s MV E/A ratio:  0.75 Eleonore Chiquito MD Electronically signed by Eleonore Chiquito MD Signature Date/Time: 08/01/2021/4:20:57 PM    Final    IR PICC PLACEMENT RIGHT >5 YRS INC IMG GUIDE  Result Date: 08/14/2021 INDICATION: Recent diagnosis of lymphoma. Request made for placement of a PICC line for the initiation of chemotherapy Note, initial plan was to proceed with port a catheter placement however the patient has recently been diagnosed with DVT and pulmonary embolism and referring oncologist has requested a PICC line initially as the patient is begun on anticoagulation. EXAM: ULTRASOUND AND FLUOROSCOPIC GUIDED PICC LINE INSERTION MEDICATIONS: None. CONTRAST:  None FLUOROSCOPY TIME:  18 seconds (2 mGy) COMPLICATIONS: None immediate. TECHNIQUE: The procedure, risks, benefits, and alternatives were explained to the patient's niece and informed written consent was obtained. A timeout was performed prior to the initiation of the procedure. The right upper extremity was  prepped with chlorhexidine in a sterile fashion, and a sterile drape was applied covering the operative field. Maximum barrier sterile technique with sterile gowns and gloves were used for the procedure. A timeout was performed prior to the initiation of the procedure. Local anesthesia was provided with 1% lidocaine. Under direct ultrasound guidance, the brachial vein was accessed with a micropuncture kit after the overlying soft tissues were anesthetized with 1% lidocaine. Real-time ultrasound guidance was utilized for vascular access including the acquisition of a permanent ultrasound image documenting patency of the accessed vessel. A guidewire was advanced to the level of the superior caval-atrial junction for measurement purposes and the PICC line was cut to length. A peel-away sheath was placed and a 40 cm, 5 [place], dual lumen was inserted to level  of the superior caval-atrial junction. A post procedure spot fluoroscopic was obtained. The catheter easily aspirated and flushed and was secured in place with stat lock device. A dressing was applied. The patient tolerated the procedure well without immediate post procedural complication. FINDINGS: After catheter placement, the tip lies within the superior cavoatrial junction. The catheter aspirates and flushes normally and is ready for immediate use. IMPRESSION: Successful ultrasound and fluoroscopic guided placement of a right brachial vein approach, 40 cm, 5 French, dual lumen PICC with tip at the superior caval-atrial junction. The PICC line is ready for immediate use. Electronically Signed   By: Sandi Mariscal M.D.   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 Based on limited sampling the patient's pathology reveals a grade 1 through 2 follicular lymphoma with a Ki-67 of 25%.  There was a lot of crush artifact in the sample and therefore a high-grade process could not be ruled out. However the predominant element of his mass has grown significantly over the last 1 month which is concerning for a high-grade follicular lymphoma or follicular lymphoma with transformation to large B-cell lymphoma.   CT neck imaging shows concern with compression of left internal jugular vein with mild mass-effect on the airway in the neck and the left carotid is inseparable from the mass.   High risk of syncopal episodes if there is compression of the common carotid baroreceptors.  -PET scan performed 08/10/2021- "1. Examination is positive for large FDG avid mass within the left neck extending from the level 2 nodal chain into the left side of the superior mediastinum compatible with metabolically active tumor. 2. There are 2 foci of increased uptake within the neck and body of pancreas lymphomatous involvement  of the pancreas. 3. Subcentimeter FDG avid right inguinal lymph node also concerning for lymphoma. 4. Several foci of increased uptake within the axial skeleton compatible with malignancy."   2) HTN   3) Obstructive sleep apnea not on CPAP machine.  This is causing sleep issues and possible cognitive issues.  4) pulmonary emboli-likely related to his lymphoma plus obesity as risk factor plus possible some venous compression.  5) recurrent syncope-likely from either carotid arterial compression or vagal nerve compression.  CTA did not show any overt carotid stenosis at this time however there can be positional compression of the carotid artery.  6) transaminitis  PLAN: -Status post cycle #1 of CHOP on 08/18/2021.  Overall tolerated well.  He feels as though his left neck mass has less pressure, it is slightly smaller, and is causing less pain.  We discussed proceeding with  rituximab today.  Again discussed the adverse effects of this medication with the patient including but not limited to possibility of infusion reaction.  He agrees to proceed.  -Prednisone 67m po daily D2-D6 post chemotherapy -cannot do Udenyca as inpatient so -- will need Granix 480 mcg daily x 5 days starting 24 hours post chemotherapy (will start today).  Orders placed to begin Granix 480 mcg subcu daily x5 days to begin this evening after rituximab completed. -daily labs x 3 -Continue allopurinol due to high risk for tumor lysis syndrome. -Would recommend some permissive hypertension up to systolics of [id number] avoid syncopal episodes with vagal compression.  -Patient was recommended to make sure he stays well-hydrated since hypovolemia will worsen potential risk for syncope.   Future Appointments  Date Time Provider [place] 08/27/2021 10:00 AM [facility] None  08/27/2021 10:40 AM KBrunetta Genera MD [facility] None     ADDENDUM  .Patient was Personally and independently  interviewed, examined and relevant elements of the history of present illness were reviewed in details and an assessment and plan was created. All elements of the patient's history of present illness , assessment and plan were discussed in details with Krisitin Curcio DNP. The above documentation reflects our combined findings assessment and plan. Patient tolerated Rituxan well without any acute toxicities.  Did have some flushing and hot sensation over his neck and face which required slowing down of the Rituxan.  Would recommend avoiding rapid Rituxan for subsequent cycle as well. He notes that his neck mass has already shrunk noticeably and is helping him swallow better. No further presyncopal or syncopal episodes. Would recommend PT/OT evaluation and discharge planning in the next couple of days.  GSullivan LoneMD MS

## 2021-08-20 NOTE — Progress Notes (Signed)
Chemo dosages and dilutions verified by 2 chemo RNs.  Consent obtained

## 2021-08-20 NOTE — Progress Notes (Signed)
PROGRESS NOTE    Joe [name]  SEG:[id number] DOB: [birth year] DOA: 08/15/2021 PCP: Celene Squibb, MD    Brief Narrative:  Joe [name] was admitted to the hospital with the working diagnosis of syncope, related to large left mass due to lymphoma.     65 year old male past medical history for hypertension, obesity and obstructive sleep apnea who presented with recurrent episodes of syncope.  He has been recently diagnosed with follicular lymphoma.  Recent diagnosis of pulmonary embolism about 2 weeks ago.  He reported losing his consciousness after experiencing severe left-sided neck pain. On his initial physical examination blood pressure 128/73, heart rate 74, respiratory rate 18, temperature 97.6, oxygen saturation 97%.  His lungs  were clear to auscultation bilaterally, he had a large soft tissue mass in the left side of his neck, running from the angle of mandible to the posterior cervical spinous processes.  Heart S1-S2, present, rhythmic, soft abdomen, no lower extremity edema.   Syncope likely due to large left neck lymphoma with mass affect on vascular structures, on airway and provoking thoracic outlet obstruction. Requiring urgent treatment.   Hematology oncology was consulted and patient was started on urgent chemotherapy.    Assessment & Plan:   Principal Problem:   Recurrent syncope Active Problems:   Pulmonary embolism (HCC)   Follicular lymphoma (HCC)   Essential hypertension   OSA (obstructive sleep apnea)   Obesity, Class III, BMI 40-49.9 (morbid obesity) ([place])   Syncope   Encounter for antineoplastic chemotherapy   Left neck follicular lymphoma with chest outlet obstruction and mass effect on vascular and airway structures, complicated with syncope. No more episodes of syncope. Patient feels much improved with sensation that mass has been shrinking and decreased in pain.  - oncology following, appreciate recs  - CHOP 10/29  - proceeding with rituximab  - 60mg   prednisone daily  - Granix - CBC am - BMP am - uric acid am - allopurinol   Pulmonary embolism.  Anticoagulation with enoxaparin    HTN  dyslipidemia, chronic, well controlled - continue home amlodipine - continue home simvastatin    OSA  Obesity class 3 - recommend outpatient sleep study  Depression- chronic, stable - continue home sertraline  Status is: Inpatient   DVT prophylaxis: Enoxaparin   Code Status:   full  Family Communication:  No family at the bedside     Nutrition Status: Nutrition Problem: Increased nutrient needs Etiology: cancer and cancer related treatments Signs/Symptoms: estimated needs Interventions: Ensure Enlive (each supplement provides 350kcal and 20 grams of protein)    Consultants:  Oncology    Subjective: Patient is feeling better, decrease mass sensation on his left neck, no dyspnea and improved dysphagia. No chest pain.   Objective: Vitals:   08/19/21 1413 08/19/21 1657 08/19/21 2043 08/20/21 0500  BP: (!) 147/77  124/83   Pulse: 94  94   Resp: 18  19   Temp:   (!) 97.5 F (36.4 C)   TempSrc:   Oral   SpO2: 95%  97%   Weight:  (!) 143.4 kg  (!) 144.6 kg  Height:        Intake/Output Summary (Last 24 hours) at 08/20/2021 0748 Last data filed at 08/20/2021 0436 Gross per 24 hour  Intake 1200 ml  Output --  Net 1200 ml   Filed Weights   08/19/21 0500 08/19/21 1657 08/20/21 0500  Weight: (!) 147.5 kg (!) 143.4 kg (!) 144.6 kg    Examination:  General: NAD, pleasant, able to participate in exam Cardiac: quick cap refill HEENT: positive left neck swelling. Non-tender to palpation. ROM limited by mass but painless. Normal speech. Respiratory: normal effort Extremities: no edema. WWP. Skin: warm and dry, no rashes noted Neuro: alert and oriented, no focal deficits Psych: Normal affect and mood  Data Reviewed: I have personally reviewed following labs and imaging studies  CBC: Recent Labs  Lab 08/16/21 0541  08/17/21 0453 08/18/21 0603 08/19/21 0540 08/20/21 0618  WBC 13.7* 10.9* 8.1 9.1 10.2  NEUTROABS 10.2* 8.0* 5.7 8.0* 8.5*  HGB 13.7 13.8 13.5 13.4 13.2  HCT 41.5 41.4 40.8 40.3 39.6  MCV 91.6 91.6 91.7 92.0 91.2  PLT 264 248 229 242 093    Basic Metabolic Panel: Recent Labs  Lab 08/15/21 1353 08/16/21 0541 08/19/21 0540 08/20/21 0618  NA 136 137 135 136  K 4.2 3.8 4.2 4.3  CL 98 102 100 101  CO2 31 28 28 31   GLUCOSE 129* 88 125* 113*  BUN 22 16 18 20   CREATININE 1.17 0.90 0.83 0.88  CALCIUM 8.8* 8.4* 8.5* 8.8*  MG  --  2.0  --   --     GFR: Estimated Creatinine Clearance: 121.9 mL/min (by C-G formula based on SCr of 0.88 mg/dL). Liver Function Tests: Recent Labs  Lab 08/15/21 1353 08/16/21 0541 08/19/21 0540 08/20/21 0618  AST 27 27 53* 58*  ALT 46* 47* 76* 108*  ALKPHOS 65 57 58 63  BILITOT 0.6 0.8 0.8 0.3  PROT 7.4 6.9 7.2 6.9  ALBUMIN 3.7 3.4* 3.4* 3.2*   Radiology Studies: I have reviewed all of the imaging during this hospital visit personally  Scheduled Meds:  allopurinol  300 mg Oral Daily   amLODipine  10 mg Oral Daily   Chlorhexidine Gluconate Cloth  6 each Topical Daily   enoxaparin (LOVENOX) injection  1 mg/kg Subcutaneous Q12H   feeding supplement  237 mL Oral BID BM   predniSONE  60 mg Oral Q breakfast   sertraline  50 mg Oral Daily   simvastatin  40 mg Oral Once per day on Mon Wed Fri   sodium chloride flush  10-40 mL Intracatheter Q12H   Continuous Infusions:   LOS: 4 days     Richarda Osmond, MD

## 2021-08-20 NOTE — Progress Notes (Signed)
Spoke with patient about nocturnal CPAP. He understood that a sleep study could be done while in-patient at this time. Education provided and he now understands that sleep study is completed as outpatient. He is, however willing to try CPAP but does not like the possibility of negative effects that may arise from the use of any hospital supplied apparatus. Equipment is covered and at the bedside at this time. Patient has declined use for now due to the compression that the straps will place against his neck and the effects that could occur from it. He has decided to continue wearing oxygen at night and sleep with an elevated HOB. RN at bedside and aware.

## 2021-08-21 ENCOUNTER — Encounter: Payer: Self-pay | Admitting: Hematology

## 2021-08-21 DIAGNOSIS — C8251 Diffuse follicle center lymphoma, lymph nodes of head, face, and neck: Secondary | ICD-10-CM

## 2021-08-21 DIAGNOSIS — R55 Syncope and collapse: Secondary | ICD-10-CM | POA: Diagnosis not present

## 2021-08-21 LAB — CBC WITH DIFFERENTIAL/PLATELET
Abs Immature Granulocytes: 0.25 10*3/uL — ABNORMAL HIGH (ref 0.00–0.07)
Basophils Absolute: 0 10*3/uL (ref 0.0–0.1)
Basophils Relative: 0 %
Eosinophils Absolute: 0 10*3/uL (ref 0.0–0.5)
Eosinophils Relative: 0 %
HCT: 40 % (ref 39.0–52.0)
Hemoglobin: 13 g/dL (ref 13.0–17.0)
Immature Granulocytes: 1 %
Lymphocytes Relative: 5 %
Lymphs Abs: 1 10*3/uL (ref 0.7–4.0)
MCH: 30.4 pg (ref 26.0–34.0)
MCHC: 32.5 g/dL (ref 30.0–36.0)
MCV: 93.7 fL (ref 80.0–100.0)
Monocytes Absolute: 0.1 10*3/uL (ref 0.1–1.0)
Monocytes Relative: 1 %
Neutro Abs: 19.2 10*3/uL — ABNORMAL HIGH (ref 1.7–7.7)
Neutrophils Relative %: 93 %
Platelets: 190 10*3/uL (ref 150–400)
RBC: 4.27 MIL/uL (ref 4.22–5.81)
RDW: 14.6 % (ref 11.5–15.5)
WBC: 20.6 10*3/uL — ABNORMAL HIGH (ref 4.0–10.5)
nRBC: 0 % (ref 0.0–0.2)

## 2021-08-21 LAB — COMPREHENSIVE METABOLIC PANEL
ALT: 107 U/L — ABNORMAL HIGH (ref 0–44)
AST: 48 U/L — ABNORMAL HIGH (ref 15–41)
Albumin: 3.2 g/dL — ABNORMAL LOW (ref 3.5–5.0)
Alkaline Phosphatase: 62 U/L (ref 38–126)
Anion gap: 6 (ref 5–15)
BUN: 19 mg/dL (ref 8–23)
CO2: 32 mmol/L (ref 22–32)
Calcium: 8.4 mg/dL — ABNORMAL LOW (ref 8.9–10.3)
Chloride: 97 mmol/L — ABNORMAL LOW (ref 98–111)
Creatinine, Ser: 0.79 mg/dL (ref 0.61–1.24)
GFR, Estimated: >60 mL/min (ref >60)
Glucose, Bld: 88 mg/dL (ref 70–99)
Potassium: 3.9 mmol/L (ref 3.5–5.1)
Sodium: 135 mmol/L (ref 135–145)
Total Bilirubin: 0.4 mg/dL (ref 0.3–1.2)
Total Protein: 6.5 g/dL (ref 6.5–8.1)

## 2021-08-21 LAB — URIC ACID: Uric Acid, Serum: 4.5 mg/dL (ref 3.7–8.6)

## 2021-08-21 NOTE — Progress Notes (Signed)
PROGRESS NOTE    Joe [name]  MEQ:[id number] DOB: [birth year] DOA: 08/15/2021 PCP: Celene Squibb, MD    Brief Narrative:  Joe [name] was admitted to the hospital with the working diagnosis of syncope, related to large left mass due to lymphoma.     65 year old male past medical history for hypertension, obesity and obstructive sleep apnea who presented with recurrent episodes of syncope.  He has been recently diagnosed with follicular lymphoma.  Recent diagnosis of pulmonary embolism about 2 weeks ago.  He reported losing his consciousness after experiencing severe left-sided neck pain. On his initial physical examination blood pressure 128/73, heart rate 74, respiratory rate 18, temperature 97.6, oxygen saturation 97%.  His lungs  were clear to auscultation bilaterally, he had a large soft tissue mass in the left side of his neck, running from the angle of mandible to the posterior cervical spinous processes.  Heart S1-S2, present, rhythmic, soft abdomen, no lower extremity edema.   Syncope likely due to large left neck lymphoma with mass affect on vascular structures, on airway and provoking thoracic outlet obstruction. Requiring urgent treatment.   Hematology oncology was consulted and patient was started on urgent chemotherapy.    Assessment & Plan:   Principal Problem:   Recurrent syncope Active Problems:   Pulmonary embolism (HCC)   Follicular lymphoma (HCC)   Essential hypertension   OSA (obstructive sleep apnea)   Obesity, Class III, BMI 40-49.9 (morbid obesity) ([place])   Syncope   Encounter for antineoplastic chemotherapy  Left neck follicular lymphoma with chest outlet obstruction and mass effect on vascular and airway structures, complicated with syncope. No more episodes of syncope since admission.  - oncology following, appreciate recs  - CHOP 10/29  - proceeding with rituximab  - 60mg  prednisone daily  - Granix  - appreciate recommendations on safe dispo planning -  CBC am - BMP am - uric acid am - continue allopurinol - PT/OT   Pulmonary embolism.  Anticoagulation with enoxaparin. Transitioning to home meds per heme/onc   HTN  dyslipidemia, chronic, well controlled - continue home amlodipine - continue home simvastatin    OSA  Obesity class 3- again observed very dysfunctional respiratory pattern while sleeping this am. - recommend outpatient sleep study - continue pm O2 as not agreeable to CPAP and consider am CBG - recommend positioning bed/pillows to limit airway obstruction while sleeping  Depression- chronic, stable - continue home sertraline  Status is: Inpatient  DVT prophylaxis: Enoxaparin   Code Status:   full  Family Communication:  No family at the bedside     Nutrition Status: Nutrition Problem: Increased nutrient needs Etiology: cancer and cancer related treatments Signs/Symptoms: estimated needs Interventions: Ensure Enlive (each supplement provides 350kcal and 20 grams of protein)    Consultants:  Oncology    Subjective: Patient sleeping on exam today.  Objective: Vitals:   08/20/21 1329 08/20/21 2131 08/21/21 0544 08/21/21 0613  BP: 134/69 138/84 (!) 151/86   Pulse: 87 100 93   Resp: 19 12 14    Temp: (!) 97.5 F (36.4 C) 97.9 F (36.6 C) 98.5 F (36.9 C)   TempSrc: Oral Oral Oral   SpO2: 95% 96% 92%   Weight:    (!) 144 kg  Height:        Intake/Output Summary (Last 24 hours) at 08/21/2021 0801 Last data filed at 08/20/2021 1741 Gross per 24 hour  Intake --  Output 700 ml  Net -700 ml    [employer]  08/19/21 1657 08/20/21 0500 08/21/21 0613  Weight: (!) 143.4 kg (!) 144.6 kg (!) 144 kg    Examination:   General: sleeping with dysfunction breathing and loud snoring- obvious airway obstruction. On 2L [facility] Cardiac: quick cap refill HEENT: positive left neck swelling improved from admission Respiratory: severe stertor and poorly regulated breathing pattern. Extremities: no edema.  WWP. Skin: warm and dry, no rashes noted Neuro: asleep  Data Reviewed: I have personally reviewed following labs and imaging studies  CBC: Recent Labs  Lab 08/17/21 0453 08/18/21 0603 08/19/21 0540 08/20/21 0618 08/21/21 0513  WBC 10.9* 8.1 9.1 10.2 20.6*  NEUTROABS 8.0* 5.7 8.0* 8.5* 19.2*  HGB 13.8 13.5 13.4 13.2 13.0  HCT 41.4 40.8 40.3 39.6 40.0  MCV 91.6 91.7 92.0 91.2 93.7  PLT 248 229 242 213 144    Basic Metabolic Panel: Recent Labs  Lab 08/15/21 1353 08/16/21 0541 08/19/21 0540 08/20/21 0618 08/21/21 0513  NA 136 137 135 136 135  K 4.2 3.8 4.2 4.3 3.9  CL 98 102 100 101 97*  CO2 31 28 28 31  32  GLUCOSE 129* 88 125* 113* 88  BUN 22 16 18 20 19   CREATININE 1.17 0.90 0.83 0.88 0.79  CALCIUM 8.8* 8.4* 8.5* 8.8* 8.4*  MG  --  2.0  --   --   --     GFR: Estimated Creatinine Clearance: 133.7 mL/min (by C-G formula based on SCr of 0.79 mg/dL). Liver Function Tests: Recent Labs  Lab 08/15/21 1353 08/16/21 0541 08/19/21 0540 08/20/21 0618 08/21/21 0513  AST 27 27 53* 58* 48*  ALT 46* 47* 76* 108* 107*  ALKPHOS 65 57 58 63 62  BILITOT 0.6 0.8 0.8 0.3 0.4  PROT 7.4 6.9 7.2 6.9 6.5  ALBUMIN 3.7 3.4* 3.4* 3.2* 3.2*   Radiology Studies: I have reviewed all of the imaging during this hospital visit personally  Scheduled Meds:  allopurinol  300 mg Oral Daily   amLODipine  10 mg Oral Daily   Chlorhexidine Gluconate Cloth  6 each Topical Daily   enoxaparin (LOVENOX) injection  120 mg Subcutaneous Q12H   feeding supplement  237 mL Oral BID BM   predniSONE  60 mg Oral Q breakfast   sertraline  50 mg Oral Daily   simvastatin  40 mg Oral Once per day on Mon Wed Fri   sodium chloride flush  10-40 mL Intracatheter Q12H   Tbo-filgastrim (GRANIX) SQ  480 mcg Subcutaneous q1800   Continuous Infusions:   LOS: 5 days     Richarda Osmond, MD

## 2021-08-21 NOTE — Progress Notes (Signed)
    OVERNIGHT PROGRESS REPORT  Notified of patient preference of having blood draw for ABG upon his usual waking time. Patient verified this is so.  Order modified to accommodate this.       Gershon Cull MSNA MSN ACNPC-AG Acute Care Nurse Practitioner [place]

## 2021-08-21 NOTE — Plan of Care (Signed)

## 2021-08-22 ENCOUNTER — Inpatient Hospital Stay (HOSPITAL_COMMUNITY): Payer: BC Managed Care – PPO

## 2021-08-22 LAB — BLOOD GAS, ARTERIAL
Acid-Base Excess: 7.6 mmol/L — ABNORMAL HIGH (ref 0.0–2.0)
Bicarbonate: 34.7 mmol/L — ABNORMAL HIGH (ref 20.0–28.0)
O2 Saturation: 97.5 %
Patient temperature: 98.6
pCO2 arterial: 61.4 mmHg — ABNORMAL HIGH (ref 32.0–48.0)
pH, Arterial: 7.371 (ref 7.350–7.450)
pO2, Arterial: 97.6 mmHg (ref 83.0–108.0)

## 2021-08-22 LAB — BASIC METABOLIC PANEL
Anion gap: 5 (ref 5–15)
BUN: 17 mg/dL (ref 8–23)
CO2: 35 mmol/L — ABNORMAL HIGH (ref 22–32)
Calcium: 8.4 mg/dL — ABNORMAL LOW (ref 8.9–10.3)
Chloride: 97 mmol/L — ABNORMAL LOW (ref 98–111)
Creatinine, Ser: 0.9 mg/dL (ref 0.61–1.24)
GFR, Estimated: >60 mL/min (ref >60)
Glucose, Bld: 87 mg/dL (ref 70–99)
Potassium: 3.9 mmol/L (ref 3.5–5.1)
Sodium: 137 mmol/L (ref 135–145)

## 2021-08-22 LAB — HEPATIC FUNCTION PANEL
ALT: 151 U/L — ABNORMAL HIGH (ref 0–44)
AST: 58 U/L — ABNORMAL HIGH (ref 15–41)
Albumin: 3.3 g/dL — ABNORMAL LOW (ref 3.5–5.0)
Alkaline Phosphatase: 72 U/L (ref 38–126)
Bilirubin, Direct: 0.1 mg/dL (ref 0.0–0.2)
Indirect Bilirubin: 0.6 mg/dL (ref 0.3–0.9)
Total Bilirubin: 0.7 mg/dL (ref 0.3–1.2)
Total Protein: 6.3 g/dL — ABNORMAL LOW (ref 6.5–8.1)

## 2021-08-22 LAB — CBC
HCT: 40.5 % (ref 39.0–52.0)
Hemoglobin: 13.1 g/dL (ref 13.0–17.0)
MCH: 30.4 pg (ref 26.0–34.0)
MCHC: 32.3 g/dL (ref 30.0–36.0)
MCV: 94 fL (ref 80.0–100.0)
Platelets: 155 10*3/uL (ref 150–400)
RBC: 4.31 MIL/uL (ref 4.22–5.81)
RDW: 14.3 % (ref 11.5–15.5)
WBC: 24.7 10*3/uL — ABNORMAL HIGH (ref 4.0–10.5)
nRBC: 0 % (ref 0.0–0.2)

## 2021-08-22 LAB — URIC ACID: Uric Acid, Serum: 4.6 mg/dL (ref 3.7–8.6)

## 2021-08-22 NOTE — Progress Notes (Signed)
    OVERNIGHT PROGRESS REPORT  Re-Ordered AM labs as noted in Attending notations.  Gershon Cull MSNA MSN ACNPC-AG Acute Care Nurse Practitioner [place]

## 2021-08-22 NOTE — Progress Notes (Signed)
PT Cancellation Note  Patient Details Name: Joe [name] MRN: [id number] DOB: 14-Sep-1956   Cancelled Treatment:    Reason Eval/Treat Not Completed: Patient at procedure or test/unavailable. Speech therapy in room, reports pt going down for swallow study. Will check back for PT eval tomorrow.    Talbot Grumbling PT, DPT 08/22/21, 2:42 PM

## 2021-08-22 NOTE — Progress Notes (Signed)
PROGRESS NOTE    Joe [name]  NAT:[id number] DOB: 08-21-1956 DOA: 08/15/2021 PCP: Celene Squibb, MD  Brief Narrative:  The patient is a 65 year old morbidly obese Caucasian male with a past medical history significant for but limited to hypertension, obstructive sleep apnea as well as other comorbidities who presented with recurrent episodes of syncope.  He is recently diagnosed with follicular lymphoma and has a PE that was also diagnosed 2 weeks ago.  He reported losing consciousness after experiencing left-sided neck pain.  On initial examination his blood pressure was normal at 120/73.  He was found to have a large soft tissue mass in the left side of the neck that radiated from the angle of the mandible to the posterior cervical spinous process.  His syncope was thought to be due to his left large lymphoma with mass-effect on vascular structures as well as airway provoking thoracic outlet obstruction and this required urgent treatment.  Hematology oncology was consulted and he started on urgent chemotherapy with CHOP on 07/29/2021.  He received a dose of rituximab on 08/20/2021.  Currently PT OT does to further evaluate and treat and patient is doing better and thinks his neck mass is not as swollen and requesting diet advancement so we will consult SLP for further evaluation.   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 Examination is positive for large FDG avid mass within the left neck extending from the level 2 nodal chain into the left side of the superior mediastinum compatible with metabolically active tumor. 2. There are 2 foci of increased uptake within the neck and body of pancreas lymphomatous involvement of the pancreas. 3. Subcentimeter FDG avid right inguinal lymph node also concerning for lymphoma. 4. Several foci of increased uptake within the axial skeleton compatible with malignancy" -CT of the neck showed concern with compression of the left internal jugular vein with mild mass-effect on the airway and the neck and left carotid which is inseparable from the mass -Medical oncology has been consulted for further evaluation recommendations; he is initiated on CHOP Chemotherapy 08/18/2021 and tolerated well -Patient also received rituximab on 08/20/21 and getting 60 mg of prednisone p.o. daily from Day 2-Day 6 s/p Chemotherapy -Per medical oncology patient cannot do Udenyca as Inpatient so is also continuing to get TBL-filgrastim 480 mcg subcu daily x5 days  -We will continue Allopurinol 300 mg po Daily due to High Risk for tumor lysis syndrome -We will obtain SLP evaluation for further diet advancement as he is on a dysphagia 1 diet and continue with sodium bicarbonate/sodium chloride mouthwashes  -Repeat CMP and CBC in a.m. -PT and OT to further evaluate and treat and this is pending  -Patient has a follow-up appointment with Dr. Irene Limbo on 08/27/2021  Leukocytosis  -Patient's WBC went from 8.1 -> 9.1 -> 10.2 -> 20.6 -> 24.7 -Increased in the setting of steroid  demargination with prednisone as well as TPO-filgrastim -Continue to monitor for signs and symptoms of infection -Repeat CBC in a.m.  PE in the setting of malignancy -He is anticoagulated with enoxaparin 120 mg subcu every 12  Essential HTN -C/w Amlodipine 10 mg po  Daily -Continue to Monitor blood pressures per protocol -Last blood pressure reading was  HLD/Dyslipidemia -C/w Simvastatin 40 mg po 3x weekly (MWF)  Depression and Anxiety -Chronic and stable -No evidence of any SI or HI -Continue with Sertraline 50 mg p.o. daily  Morbid Obesity/Class III Obesity -Complicates overall prognosis and care -Estimated body mass index is 46.88 kg/m as calculated from the following:   Height as of this encounter: 5\' 9"  (1.753 m).   Weight as of this encounter: 144 kg. -Weight Loss and Dietary Counseling given  -He will need an outpatient sleep study given concern for sleep apnea  -He is not agreeable to CPAP and will continue supplemental oxygen via nasal cannula as needed -Dr. Ouida Sills had recommended positioning in bed pillows to limit airway obstruction while sleeping  OSA -AM ABG done and showed     Component Value Date/Time   PHART 7.371 08/22/2021 0731   PCO2ART 61.4 (H) 08/22/2021 0731   PO2ART 97.6 08/22/2021 0731   HCO3 34.7 (H) 08/22/2021 0731   O2SAT 97.5 08/22/2021 0731  -We will need an outpatient sleep study -Patient does not want CPAP so we will continue with nocturnal supplemental oxygen as necessary  DVT prophylaxis: Enoxaparin 120 mg subcu every 12 Code Status: FULL CODE Family Communication: No family present at bedside  Disposition Plan: Pending further clinical improvement and clearance by specialists as he will need a safe discharge disposition to be determined  Status is: Inpatient  Remains inpatient appropriate because: He still needs PT OT to evaluate for safe discharge disposition and clearance by medical oncology   Consultants:  Medical Oncology  Procedures: Chemotherapy CHOP on 08/18/21  Antimicrobials:  Anti-infectives (From admission, onward)    None        Subjective: Seen and examined at bedside he is sitting up in the chair eating his breakfast and wants to see if he can advance his diet.  No chest  pain or shortness of breath and thinks his neck mass is not as swollen. Feels okay.  No other concerns or complaints at this time.  Objective: Vitals:   08/21/21 0613 08/21/21 1432 08/21/21 2146 08/22/21 0416  BP:  (!) 146/81 130/66 119/74  Pulse:  90 85 (!) 104  Resp:  19 18 18   Temp:  (!) 97.3 F (36.3 C) 98.2 F (36.8 C) 98.1 F (36.7 C)  TempSrc:  Oral Oral Oral  SpO2:  93% 95% 95%  Weight: (!) 144 kg     Height:        Intake/Output Summary (Last 24 hours) at 08/22/2021 1249 Last data filed at 08/22/2021 1109 Gross per 24 hour  Intake 480 ml  Output --  Net 480 ml   Filed Weights   08/19/21 1657 08/20/21 0500 08/21/21 9163  Weight: (!) 143.4 kg (!) 144.6 kg (!) 144 kg   Examination: Physical Exam:  Constitutional: WN/WD morbidly obese Caucasian male currently in no acute distress appears calm Eyes: Lids and conjunctivae normal, sclerae anicteric  ENMT: External Ears, Nose appear normal. Grossly normal hearing. Mucous membranes are moist.  Neck: Has a large neck mass on the left side Respiratory: Diminished to auscultation bilaterally, no wheezing, rales, rhonchi or crackles. Normal respiratory effort and patient  is not tachypenic. No accessory muscle use.  Unlabored breathing Cardiovascular: RRR, no murmurs / rubs / gallops. S1 and S2 auscultated.  Mild 1+ lower extremity edema Abdomen: Soft, non-tender, Distended 2/2 body habitus.  Bowel sounds positive x4.  GU: Deferred. Musculoskeletal: No clubbing / cyanosis of digits/nails. No joint deformity upper and lower extremities.  Skin: No rashes, lesions, ulcers on limited skin evaluation. No induration; Warm and dry.  Neurologic: CN 2-12 grossly intact with no focal deficits. Romberg sign and cerebellar reflexes not assessed.  Psychiatric: Normal judgment and insight. Alert and oriented x 3. Normal mood and appropriate affect.   Data Reviewed: I have personally reviewed following labs and imaging studies  CBC: Recent  Labs  Lab 08/17/21 0453 08/18/21 0603 08/19/21 0540 08/20/21 0618 08/21/21 0513 08/22/21 0623  WBC 10.9* 8.1 9.1 10.2 20.6* 24.7*  NEUTROABS 8.0* 5.7 8.0* 8.5* 19.2*  --   HGB 13.8 13.5 13.4 13.2 13.0 13.1  HCT 41.4 40.8 40.3 39.6 40.0 40.5  MCV 91.6 91.7 92.0 91.2 93.7 94.0  PLT 248 229 242 213 190 562   Basic Metabolic Panel: Recent Labs  Lab 08/16/21 0541 08/19/21 0540 08/20/21 0618 08/21/21 0513 08/22/21 0623  NA 137 135 136 135 137  K 3.8 4.2 4.3 3.9 3.9  CL 102 100 101 97* 97*  CO2 28 28 31  32 35*  GLUCOSE 88 125* 113* 88 87  BUN 16 18 20 19 17   CREATININE 0.90 0.83 0.88 0.79 0.90  CALCIUM 8.4* 8.5* 8.8* 8.4* 8.4*  MG 2.0  --   --   --   --    GFR: Estimated Creatinine Clearance: 118.8 mL/min (by C-G formula based on SCr of 0.9 mg/dL). Liver Function Tests: Recent Labs  Lab 08/15/21 1353 08/16/21 0541 08/19/21 0540 08/20/21 0618 08/21/21 0513  AST 27 27 53* 58* 48*  ALT 46* 47* 76* 108* 107*  ALKPHOS 65 57 58 63 62  BILITOT 0.6 0.8 0.8 0.3 0.4  PROT 7.4 6.9 7.2 6.9 6.5  ALBUMIN 3.7 3.4* 3.4* 3.2* 3.2*   No results for input(s): LIPASE, AMYLASE in the last 168 hours. No results for input(s): AMMONIA in the last 168 hours. Coagulation Profile: No results for input(s): INR, PROTIME in the last 168 hours. Cardiac Enzymes: No results for input(s): CKTOTAL, CKMB, CKMBINDEX, TROPONINI in the last 168 hours. BNP (last 3 results) No results for input(s): PROBNP in the last 8760 hours. HbA1C: No results for input(s): HGBA1C in the last 72 hours. CBG: No results for input(s): GLUCAP in the last 168 hours. Lipid Profile: No results for input(s): CHOL, HDL, LDLCALC, TRIG, CHOLHDL, LDLDIRECT in the last 72 hours. Thyroid Function Tests: No results for input(s): TSH, T4TOTAL, FREET4, T3FREE, THYROIDAB in the last 72 hours. Anemia Panel: No results for input(s): VITAMINB12, FOLATE, FERRITIN, TIBC, IRON, RETICCTPCT in the last 72 hours. Sepsis Labs: No  results for input(s): PROCALCITON, LATICACIDVEN in the last 168 hours.  Recent Results (from the past 240 hour(s))  Resp Panel by RT-PCR (Flu A&B, Covid) Nasopharyngeal Swab     Status: None   Collection Time: 08/15/21  8:40 PM   Specimen: Nasopharyngeal Swab; Nasopharyngeal(NP) swabs in vial transport medium  Result Value Ref Range Status   SARS Coronavirus 2 by RT PCR NEGATIVE NEGATIVE Final    Comment: (NOTE) SARS-CoV-2 target nucleic acids are NOT DETECTED.  The SARS-CoV-2 RNA is generally detectable in upper respiratory specimens during the acute phase of infection. The lowest concentration of SARS-CoV-2 viral  copies this assay can detect is 138 copies/mL. A negative result does not preclude SARS-Cov-2 infection and should not be used as the sole basis for treatment or other patient management decisions. A negative result may occur with  improper specimen collection/handling, submission of specimen other than nasopharyngeal swab, presence of viral mutation(s) within the areas targeted by this assay, and inadequate number of viral copies(<138 copies/mL). A negative result must be combined with clinical observations, patient history, and epidemiological information. The expected result is Negative.  Fact Sheet for Patients:  EntrepreneurPulse.com.au  Fact Sheet for Healthcare Providers:  IncredibleEmployment.be  This test is no t yet approved or cleared by the [place] FDA and  has been authorized for detection and/or diagnosis of SARS-CoV-2 by FDA under an Emergency Use Authorization (EUA). This EUA will remain  in effect (meaning this test can be used) for the duration of the COVID-19 declaration under Section 564(b)(1) of the Act, 21 U.S.C.section 360bbb-3(b)(1), unless the authorization is terminated  or revoked sooner.       Influenza A by PCR NEGATIVE NEGATIVE Final   Influenza B by PCR NEGATIVE NEGATIVE Final    Comment:  (NOTE) The Xpert Xpress SARS-CoV-2/FLU/RSV plus assay is intended as an aid in the diagnosis of influenza from Nasopharyngeal swab specimens and should not be used as a sole basis for treatment. Nasal washings and aspirates are unacceptable for Xpert Xpress SARS-CoV-2/FLU/RSV testing.  Fact Sheet for Patients: EntrepreneurPulse.com.au  Fact Sheet for Healthcare Providers: IncredibleEmployment.be  This test is not yet approved or cleared by the [place] FDA and has been authorized for detection and/or diagnosis of SARS-CoV-2 by FDA under an Emergency Use Authorization (EUA). This EUA will remain in effect (meaning this test can be used) for the duration of the COVID-19 declaration under Section 564(b)(1) of the Act, 21 U.S.C. section 360bbb-3(b)(1), unless the authorization is terminated or revoked.  Performed at [facility], [place] [address] [place],  [address]     RN Pressure Injury Documentation:     Estimated body mass index is 46.88 kg/m as calculated from the following:   Height as of this encounter: 5\' 9"  (1.753 m).   Weight as of this encounter: 144 kg.  Malnutrition Type:  Nutrition Problem: Increased nutrient needs Etiology: cancer and cancer related treatments  Malnutrition Characteristics:  Signs/Symptoms: estimated needs  Nutrition Interventions:  Interventions: Ensure Enlive (each supplement provides 350kcal and 20 grams of protein)   Radiology Studies: No results found.  Scheduled Meds:  allopurinol  300 mg Oral Daily   amLODipine  10 mg Oral Daily   Chlorhexidine Gluconate Cloth  6 each Topical Daily   enoxaparin (LOVENOX) injection  120 mg Subcutaneous Q12H   feeding supplement  237 mL Oral BID BM   predniSONE  60 mg Oral Q breakfast   sertraline  50 mg Oral Daily   simvastatin  40 mg Oral Once per day on Mon Wed Fri   sodium chloride flush  10-40 mL Intracatheter Q12H    Tbo-filgastrim (GRANIX) SQ  480 mcg Subcutaneous q1800   Continuous Infusions:   LOS: 6 days   Kerney Elbe, DO [facility] PAGER is on AMION  If 7PM-7AM, please contact night-coverage [email or web address]

## 2021-08-22 NOTE — Plan of Care (Signed)
  Problem: Education: Goal: Knowledge of General Education information will improve Description: Including pain rating scale, medication(s)/side effects and non-pharmacologic comfort measures Outcome: Progressing   Problem: Activity: Goal: Risk for activity intolerance will decrease Outcome: Progressing   

## 2021-08-22 NOTE — Progress Notes (Signed)
Modified Barium Swallow Progress Note  Patient Details  Name: Joe [name] MRN: [id number] Date of Birth: November 27, 1955  Today's Date: 08/22/2021  Modified Barium Swallow completed.  Full report located under Chart Review in the Imaging Section.  Brief recommendations include the following:  Clinical Impression  Pt presents with min oropharyngeal dysphagia mostly c/b decreased oral coordination with premature spillage of liquids into pharynx to pyriform sinus with thin.  Pharyngeal swallow is strong without retention with good stripping wave despite edema.  Minimally decreased laryngeal closure (suspect due to edema) allows minimal laryngeal penetration of thin liquids. No aspiration observed during the study despite pt being tested with sequential swallows.  Pt did cough x2 - once due to trace oral retention of liquids and secretions spilling into larynx post-swallow.  Pt did not transit tablet with thin liquids despite 2 trials, but easily transited with pudding.  Tablet appeared to transiently halt at LES - without pt awareness.  Additional liquid swallow faciliated tablet transiting into his stomach.  Recommend to advance diet to dys3/thin - medicine with puree.  SLP will follow up with pt for education/po tolerance/compensation strategy effectiveness.  Recommend follow aspiration and reflux precautions. Oral care important due to suspected secretion aspiration and pt's chemo treatment impact on immune system increasing risk of pna if he aspirate.   Swallow Evaluation Recommendations       SLP Diet Recommendations: Dysphagia 3 (Mech soft) solids;Thin liquid   Liquid Administration via: Cup;Straw   Medication Administration: Whole meds with puree (start and follow with liquids)       Compensations: Slow rate;Small sips/bites   Postural Changes: Remain semi-upright after after feeds/meals (Comment);Seated upright at 90 degrees   Oral Care Recommendations: Oral care BID      Kathleen Lime, MS [facility] SLP Acute Rehab Services Office [phone] Pager [phone]   Macario Golds 08/22/2021,5:03 PM

## 2021-08-22 NOTE — Evaluation (Signed)
Clinical/Bedside Swallow Evaluation Patient Details  Name: Joe [name] MRN: [id number] Date of Birth: 10-27-1955  Today's Date: 08/22/2021 Time: SLP Start Time (ACUTE ONLY): 4098 SLP Stop Time (ACUTE ONLY): 1520 SLP Time Calculation (min) (ACUTE ONLY): 35 min  Past Medical History:  Past Medical History:  Diagnosis Date   Anxiety    Arthritis    Cancer ([place])    COVID    x 2 (2020 and 2022)   Depression    Hypertension    Past Surgical History:  Past Surgical History:  Procedure Laterality Date   carpel tunnel     MASS BIOPSY Left 07/14/2021   Procedure: NECK MASS BIOPSY;  Surgeon: Melida Quitter, MD;  Location: [place];  Service: ENT;  Laterality: Left;   HPI:  Joe [name] is a 65 y.o. 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 Most of the history was obtained from niece at bedside, per report, patient has had about 4 episodes of syncope within last week.  First episode lasted several hours, he was taken to a hospital in [place], he was evaluated for syncope and noted to have elevated CO2 level per ED medical record (10/8).  Patient has since had 3 other episodes, lasted few seconds to a minute with return to baseline within few minutes and without any postictal state. EMS was activated, BP was checked and was hypotensive at 85 and this was associated with diaphoresis. In the ED, noted to be HD stable. Work-up in the ED showed elevated creatinine at 1.41 (baseline creatinine at 1.1-1.3), D-dimer 3.59.  Influenza A, B, SARS coronavirus 2 was negative. CTA chest showed PE. No findings of right heart strain or pulmonary infarction. Patient was started on heparin drip. Pt reports poor oral intake due to mass in neck and fear of choking.  Pt was eating chopped food  prior to admit.  He reports decreased edema due to chemotherapy with improvement. Pt on a puree/thin diet - hoping for diet advancement.    Assessment / Plan / Recommendation  Clinical Impression  Joe [name] noted to have dysphagia during prior evaluation 2 weeks ago. Reports edema decreased but continues with edema and hoarse voice. Cough noted during intake. Due to compressive factor with mass on oropharynx, ? cervical esophagus and trachea  - as well as indication of aspiration - dysphagia -  MBS indicated to allow instrumental assessment, establish baseline and determine effective compensation strategies.  Pt agreeable to plan. SLP Visit Diagnosis: Dysphagia, oropharyngeal phase (R13.12)    Aspiration Risk  Mild aspiration risk    Diet Recommendation     Medication Administration: Whole meds with puree Supervision: Patient able to self feed Postural Changes: Seated upright at 90 degrees;Remain upright for at least 30 minutes after po intake    Other  Recommendations   tbd   Recommendations for follow up therapy are one component of a multi-disciplinary discharge planning process, led by the attending physician.  Recommendations may be updated based on patient status, additional functional criteria and insurance authorization.  Follow up Recommendations   tbd     Frequency and Duration   tbd         Prognosis Prognosis for Safe Diet Advancement: Good      Swallow Study   General Date  of Onset: 08/22/21 Joe [name] is a 65 y.o. 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 Most of the history was obtained from niece at bedside, per report, patient has had about 4 episodes of syncope within last week.  First episode lasted several hours, he was taken to a  hospital in [place], he was evaluated for syncope and noted to have elevated CO2 level per ED medical record (10/8).  Patient has since had 3 other episodes, lasted few seconds to a minute with return to baseline within few minutes and without any postictal state. EMS was activated, BP was checked and was hypotensive at 85 and this was associated with diaphoresis. In the ED, noted to be HD stable. Work-up in the ED showed elevated creatinine at 1.41 (baseline creatinine at 1.1-1.3), D-dimer 3.59.  Influenza A, B, SARS coronavirus 2 was negative. CTA chest showed PE. No findings of right heart strain or pulmonary infarction. Patient was started on heparin drip. Pt reports poor oral intake due to mass in neck and fear of choking.  Pt was eating chopped food prior to admit.  He reports decreased edema due to chemotherapy with improvement. Pt on a puree/thin diet - hoping for diet advancement. Type of Study: MBS-Modified Barium Swallow Study Diet Prior to this Study: Dysphagia 1 (puree);Thin liquids Temperature Spikes Noted: No Respiratory Status: Room air History of Recent Intubation: No Behavior/Cognition: Alert;Cooperative;Pleasant mood Oral Cavity Assessment: Edema Oral Care Completed by SLP: No Oral Cavity - Dentition: Adequate natural dentition Vision: Functional for self-feeding Self-Feeding Abilities: Able to feed self Patient Positioning: Upright in bed Baseline Vocal Quality: Hoarse (pt reports depends on coughing) Volitional Cough: Strong Volitional Swallow: Able to elicit    Oral/Motor/Sensory Function Overall Oral Motor/Sensory Function: Moderate impairment (significant edema noted left neck, left submandiular region, left oral cavity) Facial ROM: Reduced left Facial Symmetry: Abnormal symmetry left Facial Strength: Reduced left Facial Sensation: Other (Comment) Lingual ROM: Reduced left Lingual Symmetry: Abnormal symmetry left Lingual Strength: Reduced Lingual Sensation:  Reduced Velum: Other (comment) (pt's velum deviates to left - suspect due to edema) Mandible: Impaired   Ice Chips Ice chips: Not tested   Thin Liquid Thin Liquid: Impaired Presentation: Straw Pharyngeal  Phase Impairments: Cough - Delayed    Nectar Thick Nectar Thick Liquid: Not tested   Honey Thick Honey Thick Liquid: Not tested   Puree Puree: Not tested   Solid     Solid: Not tested      Macario Golds 08/22/2021,4:14 PM   Kathleen Lime, MS [place] Office [phone] Pager [phone]

## 2021-08-22 NOTE — Plan of Care (Signed)
  Problem: Education: Goal: Knowledge of General Education information will improve Description Including pain rating scale, medication(s)/side effects and non-pharmacologic comfort measures Outcome: Progressing   Problem: Health Behavior/Discharge Planning: Goal: Ability to manage health-related needs will improve Outcome: Progressing   

## 2021-08-23 LAB — CBC WITH DIFFERENTIAL/PLATELET
Abs Immature Granulocytes: 0.4 10*3/uL — ABNORMAL HIGH (ref 0.00–0.07)
Band Neutrophils: 3 %
Basophils Absolute: 0.2 10*3/uL — ABNORMAL HIGH (ref 0.0–0.1)
Basophils Relative: 1 %
Eosinophils Absolute: 0 10*3/uL (ref 0.0–0.5)
Eosinophils Relative: 0 %
HCT: 39.7 % (ref 39.0–52.0)
Hemoglobin: 13 g/dL (ref 13.0–17.0)
Lymphocytes Relative: 8 %
Lymphs Abs: 1.5 10*3/uL (ref 0.7–4.0)
MCH: 30.5 pg (ref 26.0–34.0)
MCHC: 32.7 g/dL (ref 30.0–36.0)
MCV: 93.2 fL (ref 80.0–100.0)
Monocytes Absolute: 0.2 10*3/uL (ref 0.1–1.0)
Monocytes Relative: 1 %
Myelocytes: 2 %
Neutro Abs: 16.7 10*3/uL — ABNORMAL HIGH (ref 1.7–7.7)
Neutrophils Relative %: 85 %
Platelets: 155 10*3/uL (ref 150–400)
RBC: 4.26 MIL/uL (ref 4.22–5.81)
RDW: 14.3 % (ref 11.5–15.5)
WBC: 19 10*3/uL — ABNORMAL HIGH (ref 4.0–10.5)
nRBC: 0 % (ref 0.0–0.2)

## 2021-08-23 LAB — COMPREHENSIVE METABOLIC PANEL
ALT: 159 U/L — ABNORMAL HIGH (ref 0–44)
AST: 47 U/L — ABNORMAL HIGH (ref 15–41)
Albumin: 2.9 g/dL — ABNORMAL LOW (ref 3.5–5.0)
Alkaline Phosphatase: 82 U/L (ref 38–126)
Anion gap: 8 (ref 5–15)
BUN: 19 mg/dL (ref 8–23)
CO2: 34 mmol/L — ABNORMAL HIGH (ref 22–32)
Calcium: 8.6 mg/dL — ABNORMAL LOW (ref 8.9–10.3)
Chloride: 95 mmol/L — ABNORMAL LOW (ref 98–111)
Creatinine, Ser: 0.82 mg/dL (ref 0.61–1.24)
GFR, Estimated: >60 mL/min (ref >60)
Glucose, Bld: 86 mg/dL (ref 70–99)
Potassium: 3.7 mmol/L (ref 3.5–5.1)
Sodium: 137 mmol/L (ref 135–145)
Total Bilirubin: 0.7 mg/dL (ref 0.3–1.2)
Total Protein: 6.2 g/dL — ABNORMAL LOW (ref 6.5–8.1)

## 2021-08-23 LAB — PHOSPHORUS: Phosphorus: 4 mg/dL (ref 2.5–4.6)

## 2021-08-23 LAB — MAGNESIUM: Magnesium: 2 mg/dL (ref 1.7–2.4)

## 2021-08-23 LAB — HEPARIN ANTI-XA: Heparin LMW: 0.78 IU/mL

## 2021-08-23 NOTE — Evaluation (Signed)
Physical Therapy Evaluation Only Patient Details Name: Joe [name] MRN: [id number] DOB: 1956/05/01 Today's Date: 08/23/2021  History of Present Illness  Joe [name] is a 65 yo male who prsents with episodes of recurrent syncope. 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  CTA chest negative for PE. CTA neck negative for carotid stenosis. PMH: HTN   Clinical Impression  Pt ambulates around room with quad cane, able to open bathroom door, wash hands, navigate past furniture, complete turns, push rolling table into desired location, no unsteadiness or LOB noted. Pt with HR up to 141 without c/o dizziness, chest pain or other complaints. Pt reports feeling at baseline with mobility, states falls have been associated with strong pain sensation branching from mass in neck up to top of skull, but he hasn't experienced those pains since starting chemo. No acute PT needs identified, will sign off at this time.     Recommendations for follow up therapy are one component of a multi-disciplinary discharge planning process, led by the attending physician.  Recommendations may be updated based on patient status, additional functional criteria and insurance authorization.  Follow Up Recommendations No PT follow up    Assistance Recommended at Discharge PRN  Functional Status Assessment    Equipment Recommendations  None recommended by PT    Recommendations for Other Services       Precautions / Restrictions Precautions Precautions: Other (comment) Precaution Comments: monitor HR Restrictions Weight Bearing Restrictions: No      Mobility  Bed Mobility  General bed mobility comments: sitting EOB upon arrival    Transfers Overall transfer level: Modified independent Equipment used: Quad cane   Ambulation/Gait Ambulation/Gait assistance: Modified independent (Device/Increase time);Supervision Gait Distance (Feet): 50 Feet Assistive device: Quad  cane Gait Pattern/deviations: WFL(Within Functional Limits)  General Gait Details: pt ambulating around room with quad cane, step through pattern, clears past obstacles and changes directions without LOB, HR up to 141 without dizziness, chest pain or any complaints  Stairs            Wheelchair Mobility    Modified Rankin (Stroke Patients Only)       Balance Overall balance assessment: No apparent balance deficits (not formally assessed)        Pertinent Vitals/Pain Pain Assessment: No/denies pain    Home Living Family/patient expects to be discharged to:: Private residence Living Arrangements: Other relatives (lives with older sister) Available Help at Discharge: Family;Available 24 hours/day Type of Home: Mobile home Home Access: Stairs to enter Entrance Stairs-Rails: Right;Left Entrance Stairs-Number of Steps: 2   Home Layout: One level Home Equipment: Cane - quad;Shower Land (2 wheels)      Prior Function Prior Level of Function : Independent/Modified Independent  Mobility Comments: Pt reports ind with household ambulation, transfers. Pt reports "passing out" not falling. ADLs Comments: Pt reports ind with self care; sister performs household chores, grocery shopping     Hand Dominance        Extremity/Trunk Assessment   Upper Extremity Assessment Upper Extremity Assessment: Defer to OT evaluation    Lower Extremity Assessment Lower Extremity Assessment: Overall WFL for tasks assessed (AROM WNL, strength 4+/5 throughout, denies numbness/tingling, symmetrical)    Cervical / Trunk Assessment Cervical / Trunk Assessment: Normal (mass noted on L side of neck)  Communication   Communication: No difficulties  Cognition Arousal/Alertness: Awake/alert Behavior During Therapy: WFL for tasks assessed/performed Overall Cognitive Status: Within Functional Limits for tasks assessed  General Comments      Exercises     Assessment/Plan     PT Assessment Patient does not need any further PT services  PT Problem List         PT Treatment Interventions      PT Goals (Current goals can be found in the Care Plan section)  Acute Rehab PT Goals Patient Stated Goal: return to sister's house PT Goal Formulation: All assessment and education complete, DC therapy    Frequency     Barriers to discharge        Co-evaluation               AM-PAC PT "6 Clicks" Mobility  Outcome Measure Help needed turning from your back to your side while in a flat bed without using bedrails?: None Help needed moving from lying on your back to sitting on the side of a flat bed without using bedrails?: None Help needed moving to and from a bed to a chair (including a wheelchair)?: None Help needed standing up from a chair using your arms (e.g., wheelchair or bedside chair)?: None Help needed to walk in hospital room?: None Help needed climbing 3-5 steps with a railing? : A Little 6 Click Score: 23    End of Session   Activity Tolerance: Patient tolerated treatment well Patient left: in bed;with call bell/phone within reach Nurse Communication: Mobility status PT Visit Diagnosis: Other abnormalities of gait and mobility (R26.89)    Time: 7654-6503 PT Time Calculation (min) (ACUTE ONLY): 15 min   Charges:   PT Evaluation $PT Eval Low Complexity: 1 Low           Tori Anginette Espejo PT, DPT 08/23/21, 1:19 PM

## 2021-08-23 NOTE — Progress Notes (Addendum)
HEMATOLOGY-ONCOLOGY PROGRESS NOTE  DOS 08/23/2021  SUBJECTIVE:   Joe [name] seen this morning for follow-up.  Denies recurrent syncopal episodes.  He is not having any nausea or vomiting.  States that left neck mass feels softer today.  He has less pain to his left neck.  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  PHYSICAL EXAMINATION: ECOG PERFORMANCE STATUS: 1 - Symptomatic but completely ambulatory  Vitals:   08/22/21 2103 08/23/21 0519  BP: 138/81 139/83  Pulse: 99 90  Resp: 14 12  Temp: 99 F (37.2 C) 97.8 F (36.6 C)  SpO2: 93% 98%   Filed Weights   08/20/21 0500 08/21/21 0613 08/23/21 0558  Weight: (!) 144.6 kg (!) 144 kg (!) 144 kg    Intake/Output from previous day: 11/02 0701 - 11/03 0700 In: 360 [P.O.:360] Out: -  . GENERAL:alert, in no acute distress and comfortable SKIN: no acute rashes, no significant lesions OROPHARYNX: MMM, no thrush no ulcers NECK: Large left neck mass extending into the superior mediastinum obliterating the supra sternal notch, mass is softer LUNGS: clear to auscultation b/l with normal respiratory effort HEART: regular rate & rhythm ABDOMEN:  normoactive bowel sounds , non tender, not distended. Extremity: trace pedal edema PSYCH: alert & oriented x 3 with fluent speech NEURO: no focal motor/sensory deficits   LABORATORY DATA:  I have reviewed the data as listed CMP Latest Ref Rng & Units 08/23/2021 08/22/2021 08/21/2021  Glucose 70 - 99 mg/dL 86 87 88  BUN 8 - 23 mg/dL '19 17 19  ' Creatinine 0.61 - 1.24 mg/dL 0.82 0.90 0.79  Sodium 135 - 145 mmol/L 137 137 135  Potassium 3.5 - 5.1 mmol/L 3.7 3.9 3.9  Chloride 98 - 111 mmol/L 95(L) 97(L) 97(L)  CO2 22 - 32 mmol/L 34(H) 35(H) 32   Calcium 8.9 - 10.3 mg/dL 8.6(L) 8.4(L) 8.4(L)  Total Protein 6.5 - 8.1 g/dL 6.2(L) 6.3(L) 6.5  Total Bilirubin 0.3 - 1.2 mg/dL 0.7 0.7 0.4  Alkaline Phos 38 - 126 U/L 82 72 62  AST 15 - 41 U/L 47(H) 58(H) 48(H)  ALT 0 - 44 U/L 159(H) 151(H) 107(H)    Lab Results  Component Value Date   WBC 19.0 (H) 08/23/2021   HGB 13.0 08/23/2021   HCT 39.7 08/23/2021   MCV 93.2 08/23/2021   PLT 155 08/23/2021   NEUTROABS 16.7 (H) 08/23/2021    CT Angio Head W or Wo Contrast  Result Date: 08/15/2021 CLINICAL DATA:  Syncope and hypoxia, large neck mass due to non-Hodgkin's lymphoma EXAM: CT ANGIOGRAPHY HEAD AND NECK TECHNIQUE: Multidetector CT imaging of the head and neck was performed using the standard protocol during bolus administration of intravenous contrast. Multiplanar CT image reconstructions and MIPs were obtained to evaluate the vascular anatomy. Carotid stenosis measurements (when applicable) are obtained utilizing NASCET criteria, using the distal internal carotid diameter as the denominator. CONTRAST:  178m OMNIPAQUE IOHEXOL 350 MG/ML SOLN COMPARISON:  CT neck 07/09/2021 FINDINGS: CT HEAD FINDINGS Brain: There is no acute intracranial hemorrhage, extra-axial fluid collection, or acute infarct. The ventricles are normal in size. There is no mass lesion. There is no midline shift. Vascular: No hyperdense vessel or unexpected calcification. The vasculature is  assessed info below Skull: Normal. Negative for fracture or focal lesion. Sinuses: There is moderate mucosal thickening with layering fluid in the right maxillary sinus. There is surrounding hyperostosis consistent with chronic maxillary sinusitis. Orbits: The globes and orbits are unremarkable. Review of the MIP images confirms the above findings CTA NECK FINDINGS Aortic arch: The aortic arch is unremarkable. Right carotid system: The right common, internal, and external carotid arteries are patent with no significant stenosis, dissection, or  aneurysm. Left carotid system: The proximal left common carotid artery is unremarkable. There is complete encasement of the common carotid artery from the level of the clavicular head through the bifurcation with complete encasement of the internal and external carotid arteries. The internal carotid artery is encased from the bifurcation through the petrous segment. The distal branches of the external carotid artery are not encased. There is no significant narrowing of the left carotid system. There is no evidence of dissection or aneurysm. There is minimal calcified atherosclerotic plaque in the carotid bulb. Vertebral arteries: The vertebral arteries are patent, with no significant stenosis, dissection, or aneurysm. Skeleton: There is multilevel degenerative change of the cervical spine. There is no acute osseous abnormality or aggressive osseous lesion. Other neck: There is a large infiltrative soft tissue mass throughout the soft tissues of the left neck. The mass measures up to approximately 8.9 cm AP by 9.3 cm TV by 13.8 cm cc, increased in size since 07/09/2021 when it measured approximately 6.6 cm TV by 6.0 cm AP by 10.0 cm cc, measured again using similar technique. Inferiorly, the mass extends into the upper mediastinum just posterior to the clavicle. The mass is inseparable from the sternocleidomastoid. As above, there is complete encasement of the carotid vasculature without narrowing of the arteries. The airway is shifted to the right and moderately narrowed at the level of the oropharynx. Upper chest: The lungs are assessed on the separately dictated CT chest. Review of the MIP images confirms the above findings CTA HEAD FINDINGS Anterior circulation: The bilateral intracranial ICAs are patent. The bilateral MCAs are patent The bilateral ACAs are patent. There is no aneurysm. Posterior circulation: The bilateral V4 segments are patent. The basilar artery is patent. The bilateral PCAs are patent. The  posterior communicating arteries are not definitely identified. Venous sinuses: There is a hypodense filling defect in the right sigmoid sinus. Anatomic variants: As above. Review of the MIP images confirms the above findings IMPRESSION: Since 07/09/2021: 1. Significant interval increase in size of the large mass in the left neck which extends from the skull base superiorly into the upper mediastinum inferiorly. The mass completely encases most of the left common carotid artery, the entire left internal carotid artery from the bifurcation to the skull base, and much of the left external carotid artery and branches. Despite the encasement, there is no significant narrowing of the left carotid system. The mass results in significant mass effect on surrounding structures and moderate narrowing of the oropharyngeal airway. 2. Otherwise, unremarkable vasculature of the head and neck. 3. Hypodense filling defect in the right sigmoid sinus concerning for nonocclusive thrombus. 4. Chronic right maxillary sinusitis. Electronically Signed   By: Valetta Mole M.D.   On: 08/15/2021 19:24   DG Chest 2 View  Result Date: 08/15/2021 CLINICAL DATA:  Hypoxia.  Lymphoma. EXAM: CHEST - 2 VIEW COMPARISON:  07/31/2021 FINDINGS: Right arm PICC tip in the SVC 4 cm above the right atrium. Heart size is normal. The lungs are clear. No effusions. Left  neck mass demonstrated with displacement of the trachea towards the right. IMPRESSION: No active cardiopulmonary disease. PICC tip in the SVC 4 cm above the right atrium. Known left neck mass. Electronically Signed   By: Nelson Chimes M.D.   On: 08/15/2021 14:43   DG Chest 2 View  Result Date: 07/28/2021 CLINICAL DATA:  Seizure-like activity. EXAM: CHEST - 2 VIEW COMPARISON:  June 08, 2019 FINDINGS: The heart size and mediastinal contours are within normal limits. Both lungs are clear. The visualized skeletal structures are unremarkable. IMPRESSION: No active cardiopulmonary disease.  Electronically Signed   By: Fidela Salisbury M.D.   On: 07/28/2021 14:51   CT Head Wo Contrast  Result Date: 07/28/2021 CLINICAL DATA:  Seizure activity, frontal scalp abrasion EXAM: CT HEAD WITHOUT CONTRAST TECHNIQUE: Contiguous axial images were obtained from the base of the skull through the vertex without intravenous contrast. COMPARISON:  None. FINDINGS: Brain: No acute infarct or hemorrhage. Lateral ventricles and midline structures are unremarkable. No acute extra-axial fluid collections. No mass effect. Vascular: No hyperdense vessel or unexpected calcification. Skull: Normal. Negative for fracture or focal lesion. Sinuses/Orbits: Mild mucosal thickening within the maxillary sinuses. Remaining paranasal sinuses are clear. Other: None. IMPRESSION: 1. No acute intracranial process. Electronically Signed   By: Randa Ngo M.D.   On: 07/28/2021 18:39   CT Angio Neck W and/or Wo Contrast  Result Date: 08/15/2021 CLINICAL DATA:  Syncope and hypoxia, large neck mass due to non-Hodgkin's lymphoma EXAM: CT ANGIOGRAPHY HEAD AND NECK TECHNIQUE: Multidetector CT imaging of the head and neck was performed using the standard protocol during bolus administration of intravenous contrast. Multiplanar CT image reconstructions and MIPs were obtained to evaluate the vascular anatomy. Carotid stenosis measurements (when applicable) are obtained utilizing NASCET criteria, using the distal internal carotid diameter as the denominator. CONTRAST:  173m OMNIPAQUE IOHEXOL 350 MG/ML SOLN COMPARISON:  CT neck 07/09/2021 FINDINGS: CT HEAD FINDINGS Brain: There is no acute intracranial hemorrhage, extra-axial fluid collection, or acute infarct. The ventricles are normal in size. There is no mass lesion. There is no midline shift. Vascular: No hyperdense vessel or unexpected calcification. The vasculature is assessed info below Skull: Normal. Negative for fracture or focal lesion. Sinuses: There is moderate mucosal  thickening with layering fluid in the right maxillary sinus. There is surrounding hyperostosis consistent with chronic maxillary sinusitis. Orbits: The globes and orbits are unremarkable. Review of the MIP images confirms the above findings CTA NECK FINDINGS Aortic arch: The aortic arch is unremarkable. Right carotid system: The right common, internal, and external carotid arteries are patent with no significant stenosis, dissection, or aneurysm. Left carotid system: The proximal left common carotid artery is unremarkable. There is complete encasement of the common carotid artery from the level of the clavicular head through the bifurcation with complete encasement of the internal and external carotid arteries. The internal carotid artery is encased from the bifurcation through the petrous segment. The distal branches of the external carotid artery are not encased. There is no significant narrowing of the left carotid system. There is no evidence of dissection or aneurysm. There is minimal calcified atherosclerotic plaque in the carotid bulb. Vertebral arteries: The vertebral arteries are patent, with no significant stenosis, dissection, or aneurysm. Skeleton: There is multilevel degenerative change of the cervical spine. There is no acute osseous abnormality or aggressive osseous lesion. Other neck: There is a large infiltrative soft tissue mass throughout the soft tissues of the left neck. The mass measures up to approximately 8.9  cm AP by 9.3 cm TV by 13.8 cm cc, increased in size since 07/09/2021 when it measured approximately 6.6 cm TV by 6.0 cm AP by 10.0 cm cc, measured again using similar technique. Inferiorly, the mass extends into the upper mediastinum just posterior to the clavicle. The mass is inseparable from the sternocleidomastoid. As above, there is complete encasement of the carotid vasculature without narrowing of the arteries. The airway is shifted to the right and moderately narrowed at the level  of the oropharynx. Upper chest: The lungs are assessed on the separately dictated CT chest. Review of the MIP images confirms the above findings CTA HEAD FINDINGS Anterior circulation: The bilateral intracranial ICAs are patent. The bilateral MCAs are patent The bilateral ACAs are patent. There is no aneurysm. Posterior circulation: The bilateral V4 segments are patent. The basilar artery is patent. The bilateral PCAs are patent. The posterior communicating arteries are not definitely identified. Venous sinuses: There is a hypodense filling defect in the right sigmoid sinus. Anatomic variants: As above. Review of the MIP images confirms the above findings IMPRESSION: Since 07/09/2021: 1. Significant interval increase in size of the large mass in the left neck which extends from the skull base superiorly into the upper mediastinum inferiorly. The mass completely encases most of the left common carotid artery, the entire left internal carotid artery from the bifurcation to the skull base, and much of the left external carotid artery and branches. Despite the encasement, there is no significant narrowing of the left carotid system. The mass results in significant mass effect on surrounding structures and moderate narrowing of the oropharyngeal airway. 2. Otherwise, unremarkable vasculature of the head and neck. 3. Hypodense filling defect in the right sigmoid sinus concerning for nonocclusive thrombus. 4. Chronic right maxillary sinusitis. Electronically Signed   By: Valetta Mole M.D.   On: 08/15/2021 19:24   CT Angio Chest PE W and/or Wo Contrast  Result Date: 08/15/2021 CLINICAL DATA:  Concern for pulmonary embolism. EXAM: CT ANGIOGRAPHY CHEST WITH CONTRAST TECHNIQUE: Multidetector CT imaging of the chest was performed using the standard protocol during bolus administration of intravenous contrast. Multiplanar CT image reconstructions and MIPs were obtained to evaluate the vascular anatomy. CONTRAST:  133m  OMNIPAQUE IOHEXOL 350 MG/ML SOLN COMPARISON:  Chest CT dated 07/31/2021. FINDINGS: Cardiovascular: There is no cardiomegaly or pericardial effusion. The thoracic aorta is unremarkable. Evaluation of the pulmonary arteries is limited due to respiratory motion artifact and suboptimal visualization of the peripheral branches. No large or central pulmonary artery embolus identified. Right sided PICC now extends superiorly into the right IJ. The PICC appears to bend in the right IJ and turns downward. The tip of the PICC appears to be in the right IJ at the level of the subclavian vein (33/6 and coronal 91/7). Evaluation however is limited due to presence of intravenous contrast. Mediastinum/Nodes: No hilar or mediastinal adenopathy. The esophagus is grossly unremarkable. Partially visualized mass in the left side of the neck measures 9.1 x 8.4 cm and increase in size since the study of 07/09/2021 (previously measuring approximately 7.1 x 6.0 cm). There is associated mass effect and deviation of the trachea to the right. No mediastinal fluid collection. Lungs/Pleura: No focal consolidation, pleural effusion, or pneumothorax. The central airways are patent. Upper Abdomen: No acute abnormality. Musculoskeletal: Degenerative changes of the spine. No acute osseous pathology. Review of the MIP images confirms the above findings. IMPRESSION: 1. No CT evidence of central pulmonary artery embolus. 2. Right sided PICC now  extends superiorly into the right IJ. The tip of the PICC appears to be in the right IJ at the level of the subclavian vein. Evaluation however is limited due to presence of intravenous contrast. 3. Partially visualized mass in the left side of the neck has increased in size since the study of 07/09/2021 and is concerning for malignancy. These results were called by telephone at the time of interpretation on 08/15/2021 at 7:39 pm to Dr. Gilford Raid, who verbally acknowledged these results. Electronically Signed    By: Anner Crete M.D.   On: 08/15/2021 19:49   CT Angio Chest PE W and/or Wo Contrast  Addendum Date: 07/31/2021   ADDENDUM REPORT: 07/31/2021 19:50 ADDENDUM: These results were called by telephone at the time of interpretation on 07/31/2021 at 7:48 pm to provider JOSEPH ZAMMIT , who verbally acknowledged these results. Electronically Signed   By: Iven Finn M.D.   On: 07/31/2021 19:50   Result Date: 07/31/2021 CLINICAL DATA:  PE suspected, low/intermediate prob, neg D-dimer EXAM: CT ANGIOGRAPHY CHEST WITH CONTRAST TECHNIQUE: Multidetector CT imaging of the chest was performed using the standard protocol during bolus administration of intravenous contrast. Multiplanar CT image reconstructions and MIPs were obtained to evaluate the vascular anatomy. CONTRAST:  152m OMNIPAQUE IOHEXOL 350 MG/ML SOLN COMPARISON:  None. FINDINGS: Cardiovascular: Satisfactory opacification of the pulmonary arteries to the segmental level. Segmental and subsegmental right lower lobe pulmonary embolus. Segmental and subsegmental right middle lobe pulmonary embolus. Limited evaluation of left lower lobe with question subsegmental pulmonary emboli. Normal heart size. No pericardial effusion. Mediastinum/Nodes: No enlarged mediastinal, hilar, or axillary lymph nodes. Thyroid gland, trachea, and esophagus demonstrate no significant findings. Lungs/Pleura: Limited evaluation due to motion artifact. Expiratory phase of respiration. Mosaic attenuation of the lungs. No focal consolidation. No pulmonary nodule. No pulmonary mass. No pleural effusion. No pneumothorax. Upper Abdomen: Possible mild hepatic steatosis. Musculoskeletal: No chest wall abnormality. No suspicious lytic or blastic osseous lesions. No acute displaced fracture. Multilevel degenerative changes of the spine. Review of the MIP images confirms the above findings. IMPRESSION: Segmental and subsegmental right lower and middle lobe pulmonary emboli. Question left  lower lobe subsegmental pulmonary emboli. No findings of right heart strain or pulmonary infarction. Electronically Signed: By: MIven FinnM.D. 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 EXAM: NUCLEAR MEDICINE PET SKULL BASE TO THIGH TECHNIQUE: 16.9 mCi F-18 FDG was injected intravenously. Full-ring PET imaging was performed from the skull base to thigh after the radiotracer. CT data was obtained and used for attenuation correction and anatomic localization. Fasting blood glucose: 121 mg/dl COMPARISON:  None FINDINGS: Mediastinal blood pool activity: SUV max 3.78 Liver activity: SUV max 4.33 NECK: There is a large nodal mass within the left cervical chain extending from the level 2 region through level 4 and 5. This measures the 9.6 x 8.5 by 13.9 cm and has an SUV max 26.47, image 43/4. The tumor extends into the superior mediastinum along with the left paratracheal region, image 56/4. There is also tumor extension into the prevertebral soft tissue space at the level of the oropharynx with involvement of the right tonsillar fossa. Tumor also extends into the prevertebral soft tissues at the level of the hypopharynx with mass effect upon the hypopharynx with deviation towards the right. Incidental CT findings: none CHEST: Mild FDG uptake associated with 9 mm subcarinal lymph node within SUV  max of 3.68, image 83/4. No FDG avid pulmonary nodule. Incidental CT findings: Mild aortic atherosclerosis and coronary artery calcifications. No pleural effusion. ABDOMEN/PELVIS: No abnormal FDG uptake identified within the liver. Within the neck of pancreas there is a approximately 1.4 cm focus of increased uptake with SUV max of 7.88. Within the body of the pancreas there is a focus of increased uptake measuring 2 cm with SUV max of 14.0. Normal size spleen without focal areas of increased  uptake. Adrenal glands appear normal. No FDG avid lymph nodes within the abdomen or pelvis. FDG avid right inguinal node measures 7 mm within SUV max of 6.96, image 199/4. Incidental CT findings: Large fat containing umbilical hernia measures 8.0 x 6.3 cm. Small bilateral fat containing inguinal hernias. SKELETON: There are a few scattered FDG avid bone lesions, without corresponding CT changes. For example, within the left humeral head there is a moderate to intense focus of increased uptake within SUV max of 9.87. Within the transverse process the T10 vertebra on the left there is a focus of increased uptake within SUV max of 8.85. Within the inferior endplate of the L2 vertebral body there is a small focus of increased uptake with SUV max of 8.27. Incidental CT findings: none IMPRESSION: 1. Examination is positive for large FDG avid mass within the left neck extending from the level 2 nodal chain into the left side of the superior mediastinum compatible with metabolically active tumor. 2. There are 2 foci of increased uptake within the neck and body of pancreas lymphomatous involvement of the pancreas. 3. Subcentimeter FDG avid right inguinal lymph node also concerning for lymphoma. 4. Several foci of increased uptake within the axial skeleton compatible with malignancy. Electronically Signed   By: Kerby Moors M.D.   On: 08/14/2021 06:22   US Venous Img Lower Bilateral (DVT)  Result Date: 08/01/2021 CLINICAL DATA:  Pulmonary emboli on CTA chest 07/31/2021. Lower extremity edema. EXAM: Bilateral LOWER EXTREMITY VENOUS DOPPLER ULTRASOUND TECHNIQUE: Gray-scale sonography with compression, as well as color and duplex ultrasound, were performed to evaluate the deep venous system(s) from the level of the common femoral vein through the popliteal and proximal calf veins. COMPARISON:  None. FINDINGS: VENOUS Left side: Substantial thrombus in the superficial femoral vein and profundus femoral vein on the left,  with no significant compressibility, partially occlusive but not completely occlusive. This extends into the posterior tibial veins and peroneal veins as well as the popliteal vein. The common femoral vein appears patent. Right side: Normal appearance of the common femoral vein, superficial femoral vein, popliteal vein, and calf veins on the right. Expected compressibility and augmentation. OTHER None. Limitations: none IMPRESSION: 1. Positive for left-sided DVT with left superficial femoral vein, profundus femoral vein, popliteal vein, and calf vein thrombosis on the left side, in this patient with recent diagnosis of acute pulmonary embolus. No findings of DVT on the right side. Electronically Signed   By: Van Clines M.D.   On: 08/01/2021 15:50   DG Chest Port 1 View  Result Date: 07/31/2021 CLINICAL DATA:  Wheezing, history of COVID positive. EXAM: PORTABLE CHEST 1 VIEW COMPARISON:  July 28, 2021 FINDINGS: The heart size and mediastinal contours are within normal limits. Both lungs are clear. The visualized skeletal structures are unremarkable. IMPRESSION: No active disease. Electronically Signed   By: Virgina Norfolk M.D.   On: 07/31/2021 16:22   DG Swallowing Func-Speech Pathology  Result Date: 08/22/2021 Table formatting from the original result was not included. Objective  Swallowing Evaluation: Type of Study: MBS-Modified Barium Swallow Study  Patient Details Name: MONIQUE GIFT MRN: [id number] Date of Birth: Apr 30, 1956 Today's Date: 08/22/2021 Time: SLP Start Time (ACUTE ONLY): 1510 -SLP Stop Time (ACUTE ONLY): 1527 SLP Time Calculation (min) (ACUTE ONLY): 17 min Past Medical History: Past Medical History: Diagnosis Date  Anxiety   Arthritis   Cancer ([place])   COVID   x 2 (2020 and 2022)  Depression   Hypertension  Past Surgical History: Past Surgical History: Procedure Laterality Date  carpel tunnel    MASS BIOPSY Left 07/14/2021  Procedure: NECK MASS BIOPSY;  Surgeon: Melida Quitter, MD;   Location: [place] [facility];  Service: ENT;  Laterality: Left; HPI: HURBERT DURAN is a 65 y.o. 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 Most of the history was obtained from niece at bedside, per report, patient has had about 4 episodes of syncope within last week.  First episode lasted several hours, he was taken to a hospital in [place], he was evaluated for syncope and noted to have elevated CO2 level per ED medical record (10/8).  Patient has since had 3 other episodes, lasted few seconds to a minute with return to baseline within few minutes and without any postictal state. EMS was activated, BP was checked and was hypotensive at 85 and this was associated with diaphoresis. In the ED, noted to be HD stable. Work-up in the ED showed elevated creatinine at 1.41 (baseline creatinine at 1.1-1.3), D-dimer 3.59.  Influenza A, B, SARS coronavirus 2 was negative. CTA chest showed PE. No findings of right heart strain or pulmonary infarction. Patient was started on heparin drip. Pt reports poor oral intake due to mass in neck and fear of choking.  Pt was eating chopped food prior to admit.  He reports decreased edema due to chemotherapy with improvement. Pt on a puree/thin diet - hoping for diet advancement.  Subjective: pt awake in wheelchair, ready for swallow evaluation Assessment / Plan / Recommendation CHL IP CLINICAL IMPRESSIONS 08/22/2021 Clinical Impression Pt presents with min oropharyngeal dysphagia mostly c/b decreased oral coordination with premature spillage of liquids into pharynx to pyriform sinus with thin.  Pharyngeal swallow is strong without retention with good stripping wave despite edema.  Minimally decreased laryngeal closure (suspect due to edema) allows minimal laryngeal penetration  of thin liquids. No aspiration observed during the study despite pt being tested with sequential swallows.  Pt did cough x2 - once due to trace oral retention of liquids and secretions spilling into larynx post-swallow.  Pt did not transit tablet with thin liquids despite 2 trials, but easily transited with pudding.  Tablet appeared to transiently halt at LES - without pt awareness.  Additional liquid swallow faciliated tablet transiting into his stomach.  Recommend to advance diet to dys3/thin - medicine with puree.  SLP will follow up with pt for education/po tolerance/compensation strategy effectiveness.  Recommend follow aspiration and reflux precautions. Oral care important due to suspected secretion aspiration and pt's chemo treatment impact on immune system increasing risk of pna if he aspirate. SLP Visit Diagnosis Dysphagia, oropharyngeal phase (R13.12) Attention and concentration deficit following -- Frontal lobe and executive function deficit following -- Impact on safety and function Mild aspiration risk   CHL IP TREATMENT RECOMMENDATION 08/22/2021 Treatment  Recommendations Therapy as outlined in treatment plan below   Prognosis 08/22/2021 Prognosis for Safe Diet Advancement Good Barriers to Reach Goals -- Barriers/Prognosis Comment -- CHL IP DIET RECOMMENDATION 08/22/2021 SLP Diet Recommendations Dysphagia 3 (Mech soft) solids;Thin liquid Liquid Administration via Cup;Straw Medication Administration Whole meds with puree Compensations Slow rate;Small sips/bites Postural Changes Remain semi-upright after after feeds/meals (Comment);Seated upright at 90 degrees   CHL IP OTHER RECOMMENDATIONS 08/22/2021 Recommended Consults -- Oral Care Recommendations Oral care BID Other Recommendations --   CHL IP FOLLOW UP RECOMMENDATIONS 08/22/2021 Follow up Recommendations (No Data)   CHL IP FREQUENCY AND DURATION 08/22/2021 Speech Therapy Frequency (ACUTE ONLY) min 1 x/week Treatment Duration 1 week      CHL IP ORAL PHASE  08/22/2021 Oral Phase Impaired Oral - Pudding Teaspoon -- Oral - Pudding Cup -- Oral - Honey Teaspoon -- Oral - Honey Cup -- Oral - Nectar Teaspoon -- Oral - Nectar Cup -- Oral - Nectar Straw Premature spillage Oral - Thin Teaspoon -- Oral - Thin Cup Premature spillage Oral - Thin Straw Premature spillage Oral - Puree WFL Oral - Mech Soft WFL Oral - Regular -- Oral - Multi-Consistency -- Oral - Pill Reduced posterior propulsion Oral Phase - Comment --  CHL IP PHARYNGEAL PHASE 08/22/2021 Pharyngeal Phase Impaired Pharyngeal- Pudding Teaspoon -- Pharyngeal -- Pharyngeal- Pudding Cup -- Pharyngeal -- Pharyngeal- Honey Teaspoon -- Pharyngeal -- Pharyngeal- Honey Cup -- Pharyngeal -- Pharyngeal- Nectar Teaspoon -- Pharyngeal -- Pharyngeal- Nectar Cup -- Pharyngeal -- Pharyngeal- Nectar Straw Delayed swallow initiation-vallecula Pharyngeal Material does not enter airway Pharyngeal- Thin Teaspoon -- Pharyngeal -- Pharyngeal- Thin Cup Delayed swallow initiation-pyriform sinuses Pharyngeal Material enters airway, CONTACTS cords and then ejected out Pharyngeal- Thin Straw Delayed swallow initiation-pyriform sinuses Pharyngeal Material enters airway, CONTACTS cords and then ejected out Pharyngeal- Puree WFL Pharyngeal Material does not enter airway Pharyngeal- Mechanical Soft Delayed swallow initiation-vallecula Pharyngeal Material does not enter airway Pharyngeal- Regular -- Pharyngeal -- Pharyngeal- Multi-consistency -- Pharyngeal -- Pharyngeal- Pill Delayed swallow initiation-vallecula Pharyngeal Material does not enter airway Pharyngeal Comment Pt with minimal laryngeal penetration of thin liquids due to minimally decreased laryngeal closure - no aspiration noted; cough noted x2 during testing - suspect some secretion aspiration.  Swallow triggers with liquids at pyriform sinus as boluses spill to pyriforms prior to swallow trigger. Pharyngeal swallow is strong without retention - with good stripping wave despite edema.   Barium tablet with pudding transited through pharynx into esophagus - but appeared to stall at LES - without pt awareness.  Additional liquid swallow appeared to push tablet into stomach.  Recommend follow aspiration and reflux precautions.  CHL IP CERVICAL ESOPHAGEAL PHASE 08/22/2021 Cervical Esophageal Phase Impaired Pudding Teaspoon -- Pudding Cup -- Honey Teaspoon -- Honey Cup -- Nectar Teaspoon -- Nectar Cup -- Nectar Straw -- Thin Teaspoon -- Thin Cup -- Thin Straw -- Puree -- Mechanical Soft -- Regular -- Multi-consistency -- Pill -- Cervical Esophageal Comment -- Joe Lime, MS [facility] SLP Acute Rehab Services Office [phone] Pager [phone] Macario Golds 08/22/2021, 5:03 PM              ECHOCARDIOGRAM COMPLETE  Result Date: 08/01/2021    ECHOCARDIOGRAM REPORT   Patient Name:   KEATIN BENHAM Date of Exam: 08/01/2021 Medical Rec #:  [id number]       Height:       69.0 in Accession #:    [id number]      Weight:  323.4 lb Date of Birth:  [birth year]       BSA:          2.534 m Patient Age:    79 years        BP:           149/82 mmHg Patient Gender: M               HR:           95 bpm. Exam Location:  [facility] Procedure: 2D Echo, Cardiac Doppler and Color Doppler Indications:    Pulmonary Embolus I26.09  History:        Patient has no prior history of Echocardiogram examinations.                 Risk Factors:Hypertension and Dyslipidemia. Obesity. OSA                 (obstructive sleep apnea). Covid-19 infection. Cancer ([place])                 (From Hx).  Sonographer:    Alvino Chapel RCS Referring Phys: [id number] [place]  1. Left ventricular ejection fraction, by estimation, is 60 to 65%. The left ventricle has normal function. The left ventricle has no regional wall motion abnormalities. Left ventricular diastolic parameters were normal.  2. Right ventricular systolic function is normal. The right ventricular size is normal. Tricuspid regurgitation signal is inadequate for  assessing PA pressure.  3. The mitral valve is grossly normal. No evidence of mitral valve regurgitation. No evidence of mitral stenosis.  4. The aortic valve is tricuspid. Aortic valve regurgitation is not visualized. No aortic stenosis is present.  5. The inferior vena cava is normal in size with greater than 50% respiratory variability, suggesting right atrial pressure of 3 mmHg. Conclusion(s)/Recommendation(s): Normal biventricular function without evidence of hemodynamically significant valvular heart disease. FINDINGS  Left Ventricle: Left ventricular ejection fraction, by estimation, is 60 to 65%. The left ventricle has normal function. The left ventricle has no regional wall motion abnormalities. The left ventricular internal cavity size was normal in size. There is  no left ventricular hypertrophy. Left ventricular diastolic parameters were normal. Right Ventricle: The right ventricular size is normal. No increase in right ventricular wall thickness. Right ventricular systolic function is normal. Tricuspid regurgitation signal is inadequate for assessing PA pressure. Left Atrium: Left atrial size was normal in size. Right Atrium: Right atrial size was normal in size. Pericardium: Trivial pericardial effusion is present. Presence of pericardial fat pad. Mitral Valve: The mitral valve is grossly normal. No evidence of mitral valve regurgitation. No evidence of mitral valve stenosis. Tricuspid Valve: The tricuspid valve is grossly normal. Tricuspid valve regurgitation is trivial. No evidence of tricuspid stenosis. Aortic Valve: The aortic valve is tricuspid. Aortic valve regurgitation is not visualized. No aortic stenosis is present. Aortic valve mean gradient measures 8.0 mmHg. Aortic valve peak gradient measures 16.5 mmHg. Aortic valve area, by VTI measures 2.63  cm. Pulmonic Valve: The pulmonic valve was grossly normal. Pulmonic valve regurgitation is not visualized. No evidence of pulmonic stenosis.  Aorta: The aortic root is normal in size and structure. Venous: The right lower pulmonary vein is normal. The inferior vena cava is normal in size with greater than 50% respiratory variability, suggesting right atrial pressure of 3 mmHg. IAS/Shunts: The atrial septum is grossly normal.  LEFT VENTRICLE PLAX 2D LVIDd:         4.50 cm  Diastology LVIDs:         2.70 cm   LV e' medial:    11.10 cm/s LV PW:         1.10 cm   LV E/e' medial:  7.6 LV IVS:        1.10 cm   LV e' lateral:   12.30 cm/s LVOT diam:     2.00 cm   LV E/e' lateral: 6.8 LV SV:         97 LV SV Index:   38 LVOT Area:     3.14 cm  RIGHT VENTRICLE RV S prime:     21.10 cm/s TAPSE (M-mode): 3.2 cm LEFT ATRIUM             Index        RIGHT ATRIUM           Index LA diam:        3.60 cm 1.42 cm/m   RA Area:     11.70 cm LA Vol (A2C):   33.8 ml 13.34 ml/m  RA Volume:   26.20 ml  10.34 ml/m LA Vol (A4C):   48.7 ml 19.22 ml/m LA Biplane Vol: 42.7 ml 16.85 ml/m  AORTIC VALVE AV Area (Vmax):    2.62 cm AV Area (Vmean):   2.82 cm AV Area (VTI):     2.63 cm AV Vmax:           203.00 cm/s AV Vmean:          135.000 cm/s AV VTI:            0.371 m AV Peak Grad:      16.5 mmHg AV Mean Grad:      8.0 mmHg LVOT Vmax:         169.00 cm/s LVOT Vmean:        121.000 cm/s LVOT VTI:          0.310 m LVOT/AV VTI ratio: 0.84  AORTA Ao Root diam: 3.40 cm MITRAL VALVE MV Area (PHT): 2.83 cm     SHUNTS MV Decel Time: 268 msec     Systemic VTI:  0.31 m MV E velocity: 83.90 cm/s   Systemic Diam: 2.00 cm MV A velocity: 112.00 cm/s MV E/A ratio:  0.75 Eleonore Chiquito MD Electronically signed by Eleonore Chiquito MD Signature Date/Time: 08/01/2021/4:20:57 PM    Final    IR PICC PLACEMENT RIGHT >5 YRS INC IMG GUIDE  Result Date: 08/14/2021 INDICATION: Recent diagnosis of lymphoma. Request made for placement of a PICC line for the initiation of chemotherapy Note, initial plan was to proceed with port a catheter placement however the patient has recently been diagnosed  with DVT and pulmonary embolism and referring oncologist has requested a PICC line initially as the patient is begun on anticoagulation. EXAM: ULTRASOUND AND FLUOROSCOPIC GUIDED PICC LINE INSERTION MEDICATIONS: None. CONTRAST:  None FLUOROSCOPY TIME:  18 seconds (2 mGy) COMPLICATIONS: None immediate. TECHNIQUE: The procedure, risks, benefits, and alternatives were explained to the patient's niece and informed written consent was obtained. A timeout was performed prior to the initiation of the procedure. The right upper extremity was prepped with chlorhexidine in a sterile fashion, and a sterile drape was applied covering the operative field. Maximum barrier sterile technique with sterile gowns and gloves were used for the procedure. A timeout was performed prior to the initiation of the procedure. Local anesthesia was provided with 1% lidocaine. Under direct ultrasound guidance, the brachial vein was accessed with  a micropuncture kit after the overlying soft tissues were anesthetized with 1% lidocaine. Real-time ultrasound guidance was utilized for vascular access including the acquisition of a permanent ultrasound image documenting patency of the accessed vessel. A guidewire was advanced to the level of the superior caval-atrial junction for measurement purposes and the PICC line was cut to length. A peel-away sheath was placed and a 40 cm, 5 [place], dual lumen was inserted to level of the superior caval-atrial junction. A post procedure spot fluoroscopic was obtained. The catheter easily aspirated and flushed and was secured in place with stat lock device. A dressing was applied. The patient tolerated the procedure well without immediate post procedural complication. FINDINGS: After catheter placement, the tip lies within the superior cavoatrial junction. The catheter aspirates and flushes normally and is ready for immediate use. IMPRESSION: Successful ultrasound and fluoroscopic guided placement of a right  brachial vein approach, 40 cm, 5 French, dual lumen PICC with tip at the superior caval-atrial junction. The PICC line is ready for immediate use. Electronically Signed   By: Sandi Mariscal M.D.   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 Based on limited sampling the patient's pathology reveals a grade 1 through 2 follicular lymphoma with a Ki-67 of 25%.  There was a lot of crush artifact in the sample and therefore a high-grade process could not be ruled out. However the predominant element of his mass has grown significantly over the last 1 month which is concerning for a high-grade follicular lymphoma or follicular lymphoma with transformation to large B-cell lymphoma.   CT neck imaging shows concern with compression of left internal jugular vein with mild mass-effect on the airway in the neck and the left carotid is inseparable from the mass.   High risk of syncopal episodes if there is compression of the common carotid baroreceptors.  -PET scan performed 08/10/2021- "1. Examination is positive for large FDG avid mass within the left neck extending from the level 2 nodal chain into the left side of the superior mediastinum compatible with metabolically active tumor. 2. There are 2 foci of increased uptake within the neck and body of pancreas lymphomatous involvement of the pancreas. 3. Subcentimeter FDG avid right inguinal lymph node also concerning for lymphoma. 4. Several foci of increased uptake within the axial skeleton compatible with malignancy."   2) HTN   3) Obstructive sleep apnea not on CPAP machine.  This is causing sleep issues and possible cognitive issues.  4) pulmonary emboli-likely related to his lymphoma plus obesity as risk factor plus possible some venous compression.  5) recurrent syncope-likely from either carotid arterial compression or vagal nerve compression.   CTA did not show any overt carotid stenosis at this time however there can be positional compression of the carotid artery.  6) transaminitis  PLAN: -Status post cycle #1 of CHOP on 08/18/2021.  Overall tolerated well with the exception of some mild flushing and hot sensation over his neck and face with Rituxan.  He notes that the mass is smaller and less painful.  He also notices that the mass is softer. -Prednisone 10m po daily D2-D6 post chemotherapy -cannot do Udenyca as inpatient so -- will need Granix 480 mcg daily x 5 days starting 24 hours post chemotherapy (will start today).  Due to complete Granix on 11/4. -Continue allopurinol due to high risk for tumor lysis syndrome. -  Would recommend some permissive hypertension up to systolics of 597-331 to avoid syncopal episodes with vagal compression.  -Patient was recommended to make sure he stays well-hydrated since hypovolemia will worsen potential risk for syncope. -PICC line will need to remain in place upon discharge.  Discussed with patient and he indicates that he has a family member that can flush his PICC line for him.  He will need supplies to do this.  I have placed a TOC referral to make these arrangements.  If family member unable to flush his PICC line, will need home health or will need to come to the office for PICC line flush. -PT evaluation not yet completed.  The patient may benefit from home health PT -He has outpatient follow-up already scheduled on 11/7.  I have sent a scheduling message to arrange for cycle 2 of his chemotherapy on 11/18.  Future Appointments  Date Time Provider [place]  08/27/2021 10:00 AM [facility] None  08/27/2021 10:40 AM Brunetta Genera, MD [facility] None     ADDENDUM .Patient was Personally and independently interviewed, examined and relevant elements of the history of present illness were reviewed in details and an assessment and plan was created. All elements  of the patient's history of present illness , assessment and plan were discussed in details with Joe Bussing DNP. The above documentation reflects our combined findings assessment and plan.   Sullivan Lone MD MS

## 2021-08-23 NOTE — TOC Initial Note (Addendum)
Transition of Care [facility]) - Initial/Assessment Note    Patient Details  Name: Joe [name] MRN: [id number] Date of Birth: 16-Dec-1955  [facility] CM/SW Contact:    Keene Gilkey, Marjie Skiff, RN Phone Number: 08/23/2021, 2:22 PM  Clinical Narrative:                 Per TOC consult pt will need 3 x weekly picc flushes by [facility] and also will need HHPT. TOC has been trying to secure home health services with little success. The barriers at this time are that he has [employer] insurance and he is going to stay with his sister in [place] at discharge ([address]). The follow [place] companies have been contacted and declined pt: [place]  Expected Discharge Plan: [place] Barriers to Discharge: Continued Medical Work up   Patient Goals and CMS Choice Patient states their goals for this hospitalization and ongoing recovery are:: To get home      Expected Discharge Plan and Services Expected Discharge Plan: [place]   Discharge Planning Services: CM Consult   Living arrangements for the past 2 months: Mobile Home                  Prior Living Arrangements/Services Living arrangements for the past 2 months: Mobile Home Lives with:: Siblings          Need for Family Participation in Patient Care: Yes (Comment) Care giver support system in place?: Yes (comment)   Criminal Activity/Legal Involvement Pertinent to Current Situation/Hospitalization: No - Comment as needed  Activities of Daily Living Home Assistive Devices/Equipment: Eyeglasses, Gilford Rile (specify type) ADL Screening (condition at time of admission) Patient's cognitive ability adequate to safely complete daily activities?: Yes Is the patient deaf or have difficulty hearing?: No Does the patient have difficulty seeing, even when wearing glasses/contacts?: No Does the patient have difficulty concentrating, remembering, or  making decisions?: No Patient able to express need for assistance with ADLs?: Yes Does the patient have difficulty dressing or bathing?: No Independently performs ADLs?: Yes (appropriate for developmental age) Does the patient have difficulty walking or climbing stairs?: Yes Weakness of Legs: Both Weakness of Arms/Hands: None  Permission Sought/Granted Permission sought to share information with : Facility Art therapist granted to share information with : Yes, Verbal Permission Granted              Emotional Assessment   Attitude/Demeanor/Rapport: Gracious Affect (typically observed): Calm Orientation: : Oriented to Self, Oriented to Place, Oriented to  Time, Oriented to Situation Alcohol / Substance Use: Not Applicable Psych Involvement: No (comment)  Admission diagnosis:  Syncope [R55] Neck mass [R22.1] Syncope, unspecified syncope type [K93] Grade 3 follicular lymphoma of lymph nodes of neck (HCC) [C82.21] Lymphoma of lymph nodes of neck, unspecified lymphoma type (HCC) [C85.91] Recurrent syncope [R55] Counseling regarding advance care planning and goals of care [Z71.89] Patient Active Problem List   Diagnosis Date Noted   Encounter for antineoplastic chemotherapy    Syncope 08/16/2021   Neck mass    Lymphoma of lymph nodes of neck ([place])    Recurrent syncope 08/15/2021   Obesity, Class III, BMI 40-49.9 (morbid obesity) ([place]) 08/15/2021   Counseling regarding advance care planning and goals of care 26/71/2458   Follicular lymphoma ([place]) 08/01/2021   Essential hypertension 08/01/2021   Hyperlipidemia 08/01/2021   OSA (obstructive sleep apnea) 08/01/2021   Depression 08/01/2021  Pulmonary embolism ([place]) 07/31/2021   PCP:  Celene Squibb, MD Pharmacy:   [facility], [place] [address] [place] [address] Phone: [phone] Fax: [phone]     Social Determinants of Health (SDOH) Interventions     Readmission Risk Interventions Readmission Risk Prevention Plan 08/23/2021  Transportation Screening Complete  PCP or Specialist Appt within 3-5 Days Complete  HRI or [place] Complete  Social Work Consult for [place] Planning/Counseling Complete  Palliative Care Screening Not Applicable  Medication Review Press photographer) Complete  Some recent data might be hidden

## 2021-08-23 NOTE — Progress Notes (Signed)
PROGRESS NOTE    Joe [name]  BWL:[id number] DOB: 05-21-1956 DOA: 08/15/2021 PCP: Celene Squibb, MD  Brief Narrative:  The patient is a 65 year old morbidly obese Caucasian male with a past medical history significant for but limited to hypertension, obstructive sleep apnea as well as other comorbidities who presented with recurrent episodes of syncope.  He is recently diagnosed with follicular lymphoma and has a PE that was also diagnosed 2 weeks ago.  He reported losing consciousness after experiencing left-sided neck pain.  On initial examination his blood pressure was normal at 120/73.  He was found to have a large soft tissue mass in the left side of the neck that radiated from the angle of the mandible to the posterior cervical spinous process.  His syncope was thought to be due to his left large lymphoma with mass-effect on vascular structures as well as airway provoking thoracic outlet obstruction and this required urgent treatment.  Hematology oncology was consulted and he started on urgent chemotherapy with CHOP on 07/29/2021.  He received a dose of rituximab on 08/20/2021.  Currently PT OT tto further evaluate and treat and patient is doing better and thinks his neck mass is not as swollen and requesting diet advancement so we will consult SLP for further evaluation.   SLP placed the patient on a dysphagia 3 diet.  Patient has 1 more day of Granix and medical oncology recommending PICC line at discharge.  They are recommending a TOC consult for PICC line supplies and PICC flushes by home health RN as well as home health PT.  They are arranging follow-up with for 08/27/2021.  Patient can likely be discharged home tomorrow and he will start his second cycle of chemotherapy on 09/07/2021.  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 Examination is positive for large FDG avid mass within the left neck extending from the level 2 nodal chain into the left side of the superior mediastinum compatible with metabolically active tumor. 2. There are 2 foci of increased uptake within the neck and body of pancreas lymphomatous involvement of the pancreas. 3. Subcentimeter FDG avid right inguinal lymph node also concerning for lymphoma. 4. Several foci of increased uptake within the axial skeleton compatible with malignancy" -CT of the neck showed concern with compression of the left internal jugular vein with mild mass-effect on the airway and the neck and left carotid which is inseparable from the mass -Medical oncology has been consulted for further evaluation recommendations; he is initiated on CHOP Chemotherapy 08/18/2021 and tolerated well -Patient also received rituximab on 08/20/21 and getting 60 mg of prednisone p.o. daily from Day 2-Day 6 s/p Chemotherapy -Per medical oncology patient cannot do Udenyca as Inpatient so is also continuing to get TBL-filgrastim 480 mcg subcu daily x5 days  -We will  continue Allopurinol 300 mg po Daily due to High Risk for tumor lysis syndrome -We will obtain SLP evaluation for further diet advancement as he is on a dysphagia 1 diet and continue with sodium bicarbonate/sodium chloride mouthwashes; diet has now been advanced to dysphagia 3 diet -Repeat  CMP and CBC in a.m. -PT and OT to further evaluate and treat and they are recommending no follow-up -Patient has a follow-up appointment with Dr. Irene Limbo on 08/27/2021  Leukocytosis  -Patient's WBC went from 8.1 -> 9.1 -> 10.2 -> 20.6 -> 24.7 and is now 19.0 -Increased in the setting of steroid demargination with prednisone as well as TPO-filgrastim -Continue to monitor for signs and symptoms of infection -Repeat CBC in a.m.  PE in the setting of malignancy -He is anticoagulated with enoxaparin 120 mg subcu every 12  Essential HTN -C/w Amlodipine 10 mg po Daily -Continue to Monitor blood pressures per protocol -Last blood pressure reading was  HLD/Dyslipidemia -C/w Simvastatin 40 mg po 3x weekly (MWF)  Depression and Anxiety -Chronic and stable -No evidence of any SI or HI -Continue with Sertraline 50 mg p.o. daily  Morbid Obesity/Class III Obesity -Complicates overall prognosis and care -Estimated body mass index is 46.88 kg/m as calculated from the following:   Height as of this encounter: 5\' 9"  (1.753 m).   Weight as of this encounter: 144 kg. -Weight Loss and Dietary Counseling given  -He will need an outpatient sleep study given concern for sleep apnea  -He is not agreeable to CPAP and will continue supplemental oxygen via nasal cannula as needed -Dr. Ouida Sills had recommended positioning in bed pillows to limit airway obstruction while sleeping  OSA -AM ABG done and showed     Component Value Date/Time   PHART 7.371 08/22/2021 0731   PCO2ART 61.4 (H) 08/22/2021 0731   PO2ART 97.6 08/22/2021 0731   HCO3 34.7 (H) 08/22/2021 0731   O2SAT 97.5 08/22/2021 0731  -We will need an outpatient sleep study -Patient does not want CPAP so we will continue with nocturnal supplemental oxygen as necessary  DVT prophylaxis: Enoxaparin 120 mg subcu every 12 Code Status: FULL CODE Family Communication: No family present at bedside  Disposition Plan: Pending further clinical  improvement and clearance by specialists as he will need a safe discharge disposition to be determined.  Likely can go home in the morning after he completes his Granix  Status is: Inpatient  Remains inpatient appropriate because: He still needs PT OT to evaluate for safe discharge disposition and clearance by medical oncology   Consultants:  Medical Oncology  Procedures: Chemotherapy CHOP on 08/18/21  Antimicrobials:  Anti-infectives (From admission, onward)    None        Subjective: Seen and examined at bedside he is feels better and is ambulatory.  No chest pain or shortness of breath.  No nausea or vomiting.  Denies any lightheadedness or dizziness and thinks the neck mass is not as swollen.  Happy that his diet was advanced.  No other concerns or plans at this time.  Objective: Vitals:   08/23/21 0558 08/23/21 1319 08/23/21 1326 08/23/21 1333  BP:  (!) 141/83 132/85 128/85  Pulse:  (!) 109 100 (!) 107  Resp:  17 18 19   Temp:  98.4 F (36.9 C)    TempSrc:  Oral    SpO2:  93% 93% 95%  Weight: (!) 144 kg     Height:        Intake/Output Summary (Last 24 hours) at  08/23/2021 1737 Last data filed at 08/23/2021 1300 Gross per 24 hour  Intake 828 ml  Output --  Net 828 ml    Filed Weights   08/20/21 0500 08/21/21 0613 08/23/21 0558  Weight: (!) 144.6 kg (!) 144 kg (!) 144 kg   Examination: Physical Exam:  Constitutional: WN/WD morbidly obese Caucasian male currently in no acute distress appears calm  Eyes: Lids and conjunctivae normal, sclerae anicteric  ENMT: External Ears, Nose appear normal. Grossly normal hearing. Mucous membranes are moist. .  Neck: Has a large neck mass on the left side Respiratory: Diminished to auscultation bilaterally, no wheezing, rales, rhonchi or crackles. Normal respiratory effort and patient is not tachypenic. No accessory muscle use.  Unlabored breathing Cardiovascular: RRR, no murmurs / rubs / gallops. S1 and S2 auscultated.  Mild  1+ lower extremity edema Abdomen: Soft, non-tender, distended secondary to body habitus. Bowel sounds positive.  GU: Deferred. Musculoskeletal: No clubbing / cyanosis of digits/nails. No joint deformity upper and lower extremities.  Skin: No rashes, lesions, ulcers on limited skin evaluation. No induration; Warm and dry.  Neurologic: CN 2-12 grossly intact with no focal deficits. Romberg sign and cerebellar reflexes not assessed.  Psychiatric: Normal judgment and insight. Alert and oriented x 3. Normal mood and appropriate affect.   Data Reviewed: I have personally reviewed following labs and imaging studies  CBC: Recent Labs  Lab 08/18/21 0603 08/19/21 0540 08/20/21 0618 08/21/21 0513 08/22/21 0623 08/23/21 0500  WBC 8.1 9.1 10.2 20.6* 24.7* 19.0*  NEUTROABS 5.7 8.0* 8.5* 19.2*  --  16.7*  HGB 13.5 13.4 13.2 13.0 13.1 13.0  HCT 40.8 40.3 39.6 40.0 40.5 39.7  MCV 91.7 92.0 91.2 93.7 94.0 93.2  PLT 229 242 213 190 155 503    Basic Metabolic Panel: Recent Labs  Lab 08/19/21 0540 08/20/21 0618 08/21/21 0513 08/22/21 0623 08/23/21 0500  NA 135 136 135 137 137  K 4.2 4.3 3.9 3.9 3.7  CL 100 101 97* 97* 95*  CO2 28 31 32 35* 34*  GLUCOSE 125* 113* 88 87 86  BUN 18 20 19 17 19   CREATININE 0.83 0.88 0.79 0.90 0.82  CALCIUM 8.5* 8.8* 8.4* 8.4* 8.6*  MG  --   --   --   --  2.0  PHOS  --   --   --   --  4.0    GFR: Estimated Creatinine Clearance: 130.4 mL/min (by C-G formula based on SCr of 0.82 mg/dL). Liver Function Tests: Recent Labs  Lab 08/19/21 0540 08/20/21 0618 08/21/21 0513 08/22/21 1000 08/23/21 0500  AST 53* 58* 48* 58* 47*  ALT 76* 108* 107* 151* 159*  ALKPHOS 58 63 62 72 82  BILITOT 0.8 0.3 0.4 0.7 0.7  PROT 7.2 6.9 6.5 6.3* 6.2*  ALBUMIN 3.4* 3.2* 3.2* 3.3* 2.9*    No results for input(s): LIPASE, AMYLASE in the last 168 hours. No results for input(s): AMMONIA in the last 168 hours. Coagulation Profile: No results for input(s): INR, PROTIME in  the last 168 hours. Cardiac Enzymes: No results for input(s): CKTOTAL, CKMB, CKMBINDEX, TROPONINI in the last 168 hours. BNP (last 3 results) No results for input(s): PROBNP in the last 8760 hours. HbA1C: No results for input(s): HGBA1C in the last 72 hours. CBG: No results for input(s): GLUCAP in the last 168 hours. Lipid Profile: No results for input(s): CHOL, HDL, LDLCALC, TRIG, CHOLHDL, LDLDIRECT in the last 72 hours. Thyroid Function Tests: No results for input(s): TSH, T4TOTAL,  FREET4, T3FREE, THYROIDAB in the last 72 hours. Anemia Panel: No results for input(s): VITAMINB12, FOLATE, FERRITIN, TIBC, IRON, RETICCTPCT in the last 72 hours. Sepsis Labs: No results for input(s): PROCALCITON, LATICACIDVEN in the last 168 hours.  Recent Results (from the past 240 hour(s))  Resp Panel by RT-PCR (Flu A&B, Covid) Nasopharyngeal Swab     Status: None   Collection Time: 08/15/21  8:40 PM   Specimen: Nasopharyngeal Swab; Nasopharyngeal(NP) swabs in vial transport medium  Result Value Ref Range Status   SARS Coronavirus 2 by RT PCR NEGATIVE NEGATIVE Final    Comment: (NOTE) SARS-CoV-2 target nucleic acids are NOT DETECTED.  The SARS-CoV-2 RNA is generally detectable in upper respiratory specimens during the acute phase of infection. The lowest concentration of SARS-CoV-2 viral copies this assay can detect is 138 copies/mL. A negative result does not preclude SARS-Cov-2 infection and should not be used as the sole basis for treatment or other patient management decisions. A negative result may occur with  improper specimen collection/handling, submission of specimen other than nasopharyngeal swab, presence of viral mutation(s) within the areas targeted by this assay, and inadequate number of viral copies(<138 copies/mL). A negative result must be combined with clinical observations, patient history, and epidemiological information. The expected result is Negative.  Fact Sheet for  Patients:  EntrepreneurPulse.com.au  Fact Sheet for Healthcare Providers:  IncredibleEmployment.be  This test is no t yet approved or cleared by the [place] FDA and  has been authorized for detection and/or diagnosis of SARS-CoV-2 by FDA under an Emergency Use Authorization (EUA). This EUA will remain  in effect (meaning this test can be used) for the duration of the COVID-19 declaration under Section 564(b)(1) of the Act, 21 U.S.C.section 360bbb-3(b)(1), unless the authorization is terminated  or revoked sooner.       Influenza A by PCR NEGATIVE NEGATIVE Final   Influenza B by PCR NEGATIVE NEGATIVE Final    Comment: (NOTE) The Xpert Xpress SARS-CoV-2/FLU/RSV plus assay is intended as an aid in the diagnosis of influenza from Nasopharyngeal swab specimens and should not be used as a sole basis for treatment. Nasal washings and aspirates are unacceptable for Xpert Xpress SARS-CoV-2/FLU/RSV testing.  Fact Sheet for Patients: EntrepreneurPulse.com.au  Fact Sheet for Healthcare Providers: IncredibleEmployment.be  This test is not yet approved or cleared by the [place] FDA and has been authorized for detection and/or diagnosis of SARS-CoV-2 by FDA under an Emergency Use Authorization (EUA). This EUA will remain in effect (meaning this test can be used) for the duration of the COVID-19 declaration under Section 564(b)(1) of the Act, 21 U.S.C. section 360bbb-3(b)(1), unless the authorization is terminated or revoked.  Performed at [facility], [place] [address], [place], [place] [address]      RN Pressure Injury Documentation:     Estimated body mass index is 46.88 kg/m as calculated from the following:   Height as of this encounter: 5\' 9"  (1.753 m).   Weight as of this encounter: 144 kg.  Malnutrition Type:  Nutrition Problem: Increased nutrient needs Etiology: cancer  and cancer related treatments  Malnutrition Characteristics:  Signs/Symptoms: estimated needs  Nutrition Interventions:  Interventions: Ensure Enlive (each supplement provides 350kcal and 20 grams of protein)   Radiology Studies: DG Swallowing Func-Speech Pathology  Result Date: 08/22/2021 Table formatting from the original result was not included. Objective Swallowing Evaluation: Type of Study: MBS-Modified Barium Swallow Study  Patient Details Name: Joe [name] MRN: [id number] Date of Birth: December 08, 1955 Today's  Date: 08/22/2021 Time: SLP Start Time (ACUTE ONLY): 1510 -SLP Stop Time (ACUTE ONLY): 1527 SLP Time Calculation (min) (ACUTE ONLY): 17 min Past Medical History: Past Medical History: Diagnosis Date  Anxiety   Arthritis   Cancer ([place])   COVID   x 2 (2020 and 2022)  Depression   Hypertension  Past Surgical History: Past Surgical History: Procedure Laterality Date  carpel tunnel    MASS BIOPSY Left 07/14/2021  Procedure: NECK MASS BIOPSY;  Surgeon: Melida Quitter, MD;  Location: [place];  Service: ENT;  Laterality: Left; HPI: DHANUSH JOKERST is a 65 y.o. 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 Most of the history was obtained from niece at bedside, per report, patient has had about 4 episodes of syncope within last week.  First episode lasted several hours, he was taken to a hospital in [place], he was evaluated for syncope and noted to have elevated CO2 level per ED medical record (10/8).  Patient has since had 3 other episodes, lasted few seconds to a minute with return to baseline within few minutes and without any postictal state. EMS was activated, BP was checked and was hypotensive at 85 and this was associated with diaphoresis. In the ED, noted to be HD stable. Work-up  in the ED showed elevated creatinine at 1.41 (baseline creatinine at 1.1-1.3), D-dimer 3.59.  Influenza A, B, SARS coronavirus 2 was negative. CTA chest showed PE. No findings of right heart strain or pulmonary infarction. Patient was started on heparin drip. Pt reports poor oral intake due to mass in neck and fear of choking.  Pt was eating chopped food prior to admit.  He reports decreased edema due to chemotherapy with improvement. Pt on a puree/thin diet - hoping for diet advancement.  Subjective: pt awake in wheelchair, ready for swallow evaluation Assessment / Plan / Recommendation CHL IP CLINICAL IMPRESSIONS 08/22/2021 Clinical Impression Pt presents with min oropharyngeal dysphagia mostly c/b decreased oral coordination with premature spillage of liquids into pharynx to pyriform sinus with thin.  Pharyngeal swallow is strong without retention with good stripping wave despite edema.  Minimally decreased laryngeal closure (suspect due to edema) allows minimal laryngeal penetration of thin liquids. No aspiration observed during the study despite pt being tested with sequential swallows.  Pt did cough x2 - once due to trace oral retention of liquids and secretions spilling into larynx post-swallow.  Pt did not transit tablet with thin liquids despite 2 trials, but easily transited with pudding.  Tablet appeared to transiently halt at LES - without pt awareness.  Additional liquid swallow faciliated tablet transiting into his stomach.  Recommend to advance diet to dys3/thin - medicine with puree.  SLP will follow up with pt for education/po tolerance/compensation strategy effectiveness.  Recommend follow aspiration and reflux precautions. Oral care important due to suspected secretion aspiration and pt's chemo treatment impact on immune system increasing risk of pna if he aspirate. SLP Visit Diagnosis Dysphagia, oropharyngeal phase (R13.12) Attention and concentration deficit following -- Frontal lobe and  executive function deficit following -- Impact on safety and function Mild aspiration risk   CHL IP TREATMENT RECOMMENDATION 08/22/2021 Treatment Recommendations Therapy as outlined in treatment plan below   Prognosis 08/22/2021 Prognosis for Safe Diet Advancement Good Barriers to Reach Goals --  Barriers/Prognosis Comment -- CHL IP DIET RECOMMENDATION 08/22/2021 SLP Diet Recommendations Dysphagia 3 (Mech soft) solids;Thin liquid Liquid Administration via Cup;Straw Medication Administration Whole meds with puree Compensations Slow rate;Small sips/bites Postural Changes Remain semi-upright after after feeds/meals (Comment);Seated upright at 90 degrees   CHL IP OTHER RECOMMENDATIONS 08/22/2021 Recommended Consults -- Oral Care Recommendations Oral care BID Other Recommendations --   CHL IP FOLLOW UP RECOMMENDATIONS 08/22/2021 Follow up Recommendations (No Data)   CHL IP FREQUENCY AND DURATION 08/22/2021 Speech Therapy Frequency (ACUTE ONLY) min 1 x/week Treatment Duration 1 week      CHL IP ORAL PHASE 08/22/2021 Oral Phase Impaired Oral - Pudding Teaspoon -- Oral - Pudding Cup -- Oral - Honey Teaspoon -- Oral - Honey Cup -- Oral - Nectar Teaspoon -- Oral - Nectar Cup -- Oral - Nectar Straw Premature spillage Oral - Thin Teaspoon -- Oral - Thin Cup Premature spillage Oral - Thin Straw Premature spillage Oral - Puree WFL Oral - Mech Soft WFL Oral - Regular -- Oral - Multi-Consistency -- Oral - Pill Reduced posterior propulsion Oral Phase - Comment --  CHL IP PHARYNGEAL PHASE 08/22/2021 Pharyngeal Phase Impaired Pharyngeal- Pudding Teaspoon -- Pharyngeal -- Pharyngeal- Pudding Cup -- Pharyngeal -- Pharyngeal- Honey Teaspoon -- Pharyngeal -- Pharyngeal- Honey Cup -- Pharyngeal -- Pharyngeal- Nectar Teaspoon -- Pharyngeal -- Pharyngeal- Nectar Cup -- Pharyngeal -- Pharyngeal- Nectar Straw Delayed swallow initiation-vallecula Pharyngeal Material does not enter airway Pharyngeal- Thin Teaspoon -- Pharyngeal -- Pharyngeal- Thin Cup  Delayed swallow initiation-pyriform sinuses Pharyngeal Material enters airway, CONTACTS cords and then ejected out Pharyngeal- Thin Straw Delayed swallow initiation-pyriform sinuses Pharyngeal Material enters airway, CONTACTS cords and then ejected out Pharyngeal- Puree WFL Pharyngeal Material does not enter airway Pharyngeal- Mechanical Soft Delayed swallow initiation-vallecula Pharyngeal Material does not enter airway Pharyngeal- Regular -- Pharyngeal -- Pharyngeal- Multi-consistency -- Pharyngeal -- Pharyngeal- Pill Delayed swallow initiation-vallecula Pharyngeal Material does not enter airway Pharyngeal Comment Pt with minimal laryngeal penetration of thin liquids due to minimally decreased laryngeal closure - no aspiration noted; cough noted x2 during testing - suspect some secretion aspiration.  Swallow triggers with liquids at pyriform sinus as boluses spill to pyriforms prior to swallow trigger. Pharyngeal swallow is strong without retention - with good stripping wave despite edema.  Barium tablet with pudding transited through pharynx into esophagus - but appeared to stall at LES - without pt awareness.  Additional liquid swallow appeared to push tablet into stomach.  Recommend follow aspiration and reflux precautions.  CHL IP CERVICAL ESOPHAGEAL PHASE 08/22/2021 Cervical Esophageal Phase Impaired Pudding Teaspoon -- Pudding Cup -- Honey Teaspoon -- Honey Cup -- Nectar Teaspoon -- Nectar Cup -- Nectar Straw -- Thin Teaspoon -- Thin Cup -- Thin Straw -- Puree -- Mechanical Soft -- Regular -- Multi-consistency -- Pill -- Cervical Esophageal Comment -- Kathleen Lime, MS [facility] SLP Acute Rehab Services Office [phone] Pager [phone] Macario Golds 08/22/2021, 5:03 PM               Scheduled Meds:  allopurinol  300 mg Oral Daily   amLODipine  10 mg Oral Daily   Chlorhexidine Gluconate Cloth  6 each Topical Daily   enoxaparin (LOVENOX) injection  120 mg Subcutaneous Q12H   feeding supplement  237 mL Oral  BID BM   sertraline  50 mg Oral Daily   simvastatin  40 mg Oral Once per day on Mon Wed Fri   sodium chloride flush  10-40 mL Intracatheter Q12H   Tbo-filgastrim (GRANIX) SQ  480 mcg Subcutaneous q1800   Continuous Infusions:   LOS: 7 days   Kerney Elbe, DO [facility] PAGER is on AMION  If 7PM-7AM, please contact night-coverage [email or web address]

## 2021-08-23 NOTE — Evaluation (Signed)
Occupational Therapy Evaluation Patient Details Name: Joe [name] MRN: [id number] DOB: [birth year] Today's Date: 08/23/2021   History of Present Illness Joe [name] is a 65 yo male who prsents with episodes of recurrent syncope. 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  CTA chest negative for PE. CTA neck negative for carotid stenosis. PMH: HTN   Clinical Impression   PTA, pt was living at home with sister who performed heavy IADLs and pt independent in ADLs. Upon evaluation, pt at reports that he is at baseline function with ADL tasks and feels "much stronger" than he was before coming to the hospital.Pt performing all ADLs at the Mod I level and with no OT needs. Recommending home with intermittent supervision and no OT follow up. Signing off.     Recommendations for follow up therapy are one component of a multi-disciplinary discharge planning process, led by the attending physician.  Recommendations may be updated based on patient status, additional functional criteria and insurance authorization.   Follow Up Recommendations  No OT follow up    Assistance Recommended at Discharge Intermittent Supervision/Assistance  Functional Status Assessment  Patient has had a recent decline in their functional status and demonstrates the ability to make significant improvements in function in a reasonable and predictable amount of time.  Equipment Recommendations  None recommended by OT    Recommendations for Other Services       Precautions / Restrictions Precautions Precautions: Other (comment) Restrictions Weight Bearing Restrictions: No      Mobility Bed Mobility Overal bed mobility: Modified Independent                  Transfers Overall transfer level: Modified independent Equipment used: None                      Balance Overall balance assessment: No apparent balance deficits (not formally assessed)                                          ADL either performed or assessed with clinical judgement   ADL Overall ADL's : Modified independent                                             Vision Patient Visual Report: No change from baseline       Perception     Praxis      Pertinent Vitals/Pain Pain Assessment: No/denies pain     Hand Dominance     Extremity/Trunk Assessment Upper Extremity Assessment Upper Extremity Assessment: Overall WFL for tasks assessed       Cervical / Trunk Assessment Cervical / Trunk Assessment: Normal   Communication Communication Communication: No difficulties   Cognition Arousal/Alertness: Awake/alert Behavior During Therapy: WFL for tasks assessed/performed Overall Cognitive Status: Within Functional Limits for tasks assessed                                       General Comments       Exercises     Shoulder Instructions      Home Living Family/patient expects to be discharged to:: Private residence Living Arrangements:  Other relatives Available Help at Discharge: Family;Available 24 hours/day Type of Home: Mobile home Home Access: Stairs to enter Entrance Stairs-Number of Steps: 2 Entrance Stairs-Rails: Right;Left Home Layout: One level               Home Equipment: Cane - quad;Shower Land (2 wheels)          Prior Functioning/Environment Prior Level of Function : Independent/Modified Independent             Mobility Comments: Pt reports ind with household ambulation, transfers. Pt reports "passing out" not falling. ADLs Comments: Pt reports ind with self care; sister performs household chores, grocery shopping        OT Problem List: Impaired balance (sitting and/or standing);Decreased safety awareness      OT Treatment/Interventions:      OT Goals(Current goals can be found in the care plan section)    OT Frequency:     Barriers to D/C:             Co-evaluation              AM-PAC OT "6 Clicks" Daily Activity     Outcome Measure Help from another person eating meals?: None Help from another person taking care of personal grooming?: None Help from another person toileting, which includes using toliet, bedpan, or urinal?: None Help from another person bathing (including washing, rinsing, drying)?: None Help from another person to put on and taking off regular upper body clothing?: None Help from another person to put on and taking off regular lower body clothing?: None 6 Click Score: 24   End of Session Nurse Communication: Mobility status  Activity Tolerance: Patient tolerated treatment well Patient left: in chair;with call bell/phone within reach  OT Visit Diagnosis: Unsteadiness on feet (R26.81)                Time: 1520-1530 OT Time Calculation (min): 10 min Charges:  OT General Charges $OT Visit: 1 Visit OT Evaluation $OT Eval Low Complexity: 1 Low  Jackquline Denmark, OTS Acute Rehab Office: [phone]   [name] 08/23/2021, 5:42 PM

## 2021-08-23 NOTE — Progress Notes (Signed)
ANTICOAGULATION CONSULT NOTE  Pharmacy Consult for enoxaparin Indication: pulmonary embolus  No Known Allergies  Patient Measurements: Height: 5\' 9"  (175.3 cm) Weight: (!) 144 kg (317 lb 7.4 oz) IBW/kg (Calculated) : 70.7  Labs: Recent Labs    08/21/21 0513 08/22/21 0623 08/23/21 0500 08/23/21 1158  HGB 13.0 13.1 13.0  --   HCT 40.0 40.5 39.7  --   PLT 190 155 155  --   HEPRLOWMOCWT  --   --   --  0.78  CREATININE 0.79 0.90 0.82  --      Estimated Creatinine Clearance: 130.4 mL/min (by C-G formula based on SCr of 0.82 mg/dL).  Assessment: 65 yo M on Eliquis PTA for recent PE. Now transitioned to LMWH treatment per onc. LMWH level is therapeutic today at 0.78. CBC is stable  Goal of Therapy:  Anti-Xa level 0.6-1 units/ml 4hrs after LMWH dose given Monitor platelets by anticoagulation protocol: Yes   Plan:  Continue enoxaparin dosing at 120 mg Avalon Q12h Monitor CBC, s/s of bleed  Ulice Dash D  08/23/2021 2:06 PM

## 2021-08-24 ENCOUNTER — Telehealth: Payer: Self-pay | Admitting: Student

## 2021-08-24 DIAGNOSIS — R0600 Dyspnea, unspecified: Secondary | ICD-10-CM

## 2021-08-24 DIAGNOSIS — G4733 Obstructive sleep apnea (adult) (pediatric): Secondary | ICD-10-CM

## 2021-08-24 DIAGNOSIS — F17200 Nicotine dependence, unspecified, uncomplicated: Secondary | ICD-10-CM

## 2021-08-24 DIAGNOSIS — G4734 Idiopathic sleep related nonobstructive alveolar hypoventilation: Secondary | ICD-10-CM

## 2021-08-24 LAB — CBC WITH DIFFERENTIAL/PLATELET
Abs Immature Granulocytes: 1.13 10*3/uL — ABNORMAL HIGH (ref 0.00–0.07)
Basophils Absolute: 0.1 10*3/uL (ref 0.0–0.1)
Basophils Relative: 1 %
Eosinophils Absolute: 0.2 10*3/uL (ref 0.0–0.5)
Eosinophils Relative: 2 %
HCT: 38.4 % — ABNORMAL LOW (ref 39.0–52.0)
Hemoglobin: 12.6 g/dL — ABNORMAL LOW (ref 13.0–17.0)
Immature Granulocytes: 12 %
Lymphocytes Relative: 17 %
Lymphs Abs: 1.6 10*3/uL (ref 0.7–4.0)
MCH: 30.2 pg (ref 26.0–34.0)
MCHC: 32.8 g/dL (ref 30.0–36.0)
MCV: 92.1 fL (ref 80.0–100.0)
Monocytes Absolute: 0.1 10*3/uL (ref 0.1–1.0)
Monocytes Relative: 1 %
Neutro Abs: 6.4 10*3/uL (ref 1.7–7.7)
Neutrophils Relative %: 67 %
Platelets: 148 10*3/uL — ABNORMAL LOW (ref 150–400)
RBC: 4.17 MIL/uL — ABNORMAL LOW (ref 4.22–5.81)
RDW: 14.1 % (ref 11.5–15.5)
WBC: 9.4 10*3/uL (ref 4.0–10.5)
nRBC: 0 % (ref 0.0–0.2)

## 2021-08-24 LAB — MAGNESIUM: Magnesium: 1.9 mg/dL (ref 1.7–2.4)

## 2021-08-24 LAB — COMPREHENSIVE METABOLIC PANEL
ALT: 161 U/L — ABNORMAL HIGH (ref 0–44)
AST: 47 U/L — ABNORMAL HIGH (ref 15–41)
Albumin: 3.1 g/dL — ABNORMAL LOW (ref 3.5–5.0)
Alkaline Phosphatase: 88 U/L (ref 38–126)
Anion gap: 6 (ref 5–15)
BUN: 21 mg/dL (ref 8–23)
CO2: 35 mmol/L — ABNORMAL HIGH (ref 22–32)
Calcium: 8.4 mg/dL — ABNORMAL LOW (ref 8.9–10.3)
Chloride: 95 mmol/L — ABNORMAL LOW (ref 98–111)
Creatinine, Ser: 0.93 mg/dL (ref 0.61–1.24)
GFR, Estimated: >60 mL/min (ref >60)
Glucose, Bld: 86 mg/dL (ref 70–99)
Potassium: 4 mmol/L (ref 3.5–5.1)
Sodium: 136 mmol/L (ref 135–145)
Total Bilirubin: 0.9 mg/dL (ref 0.3–1.2)
Total Protein: 6.2 g/dL — ABNORMAL LOW (ref 6.5–8.1)

## 2021-08-24 LAB — PHOSPHORUS: Phosphorus: 4 mg/dL (ref 2.5–4.6)

## 2021-08-24 MED ORDER — TBO-FILGRASTIM 480 MCG/0.8ML ~~LOC~~ SOSY
480.0000 ug | PREFILLED_SYRINGE | Freq: Once | SUBCUTANEOUS | Status: AC
  Administered 2021-08-24: 480 ug via SUBCUTANEOUS
  Filled 2021-08-24: qty 0.8

## 2021-08-24 NOTE — Progress Notes (Signed)
Nutrition Follow-up  DOCUMENTATION CODES:   Morbid obesity  INTERVENTION:   -Ensure Enlive po BID, each supplement provides 350 kcal and 20 grams of protein  NUTRITION DIAGNOSIS:   Increased nutrient needs related to cancer and cancer related treatments as evidenced by estimated needs.  Ongoing.  GOAL:   Patient will meet greater than or equal to 90% of their needs  Progressing.  MONITOR:   PO intake, Supplement acceptance, Diet advancement, Skin, Weight trends, Labs, I & O's  ASSESSMENT:   65 year old male past medical history for hypertension and obstructive sleep apnea who presented with recurrent episodes of syncope. Recently diagnosed with follicular lymphoma. Pt with large soft tissue mass in the left side of his neck, running from the angle of mandible to the posterior cervical spinous processes.  10/26: admitted, dysphagia 1 diet  Patient now consuming 100% of meals. Accepting of Ensure supplements as well. SLP recommended dysphagia 3 diet on 11/2.  Admission weight: 319 lbs Current weight :315 lbs.  Medications reviewed.  Labs reviewed.  Diet Order:   Diet Order             DIET DYS 3 Room service appropriate? Yes; Fluid consistency: Thin  Diet effective now                   EDUCATION NEEDS:   Not appropriate for education at this time  Skin:  Skin Assessment: Reviewed RN Assessment  Last BM:  11/3  Height:   Ht Readings from Last 1 Encounters:  08/17/21 5\' 9"  (1.753 m)    Weight:   Wt Readings from Last 1 Encounters:  08/24/21 (!) 143.3 kg    BMI:  Body mass index is 46.65 kg/m.  Estimated Nutritional Needs:   Kcal:  2100-2300  Protein:  120-140 grams  Fluid:  >/= 2 L/day  Clayton Bibles, MS, RD, LDN Inpatient Clinical Dietitian Contact information available via Amion

## 2021-08-24 NOTE — Progress Notes (Addendum)
HEMATOLOGY-ONCOLOGY PROGRESS NOTE  DOS 08/24/2021  SUBJECTIVE:   Mr. Stene seen this morning for follow-up.  When I entered his room, his O2 sat monitor was going off.  The patient was asleep without his oxygen on.  His O2 sat at that time was in the mid 12s.  The patient was awakened and O2 was reapplied.  O2 sat quickly came up to the low 90s.  The patient has an underlying diagnosis of sleep apnea.  Has been refusing CPAP machine.  Initially told me that he did not have oxygen at home, but then told me he did.  However, it sounds like he does not wear it very often because it gives him a headache.  He has not had any recurrent syncopal episodes.  He denies nausea or vomiting.  Left neck mass continues to feel softer and smaller.  Less pain to his left neck.  He is not having any fevers or chills.  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  PHYSICAL EXAMINATION: ECOG PERFORMANCE STATUS: 1 - Symptomatic but completely ambulatory  Vitals:   08/23/21 2048 08/24/21 0620  BP: 132/71 139/88  Pulse: (!) 102 86  Resp: 16 18  Temp: 98 F (36.7 C) 97.9 F (36.6 C)  SpO2: 93% 94%   Filed Weights   08/21/21 0613 08/23/21 0558 08/24/21 0500  Weight: (!) 144 kg (!) 144 kg (!) 143.3 kg    Intake/Output from previous day: 11/03 0701 - 11/04 0700 In: 828 [P.O.:828] Out: -  . GENERAL:alert, in no acute distress and comfortable SKIN: no acute rashes, no significant lesions OROPHARYNX: MMM, no thrush no ulcers NECK: Large left neck mass extending into the superior mediastinum obliterating the supra sternal notch, mass is softer LUNGS: clear to auscultation b/l with normal respiratory effort HEART: regular rate & rhythm ABDOMEN:  normoactive  bowel sounds , non tender, not distended. Extremity: trace pedal edema PSYCH: alert & oriented x 3 with fluent speech NEURO: no focal motor/sensory deficits   LABORATORY DATA:  I have reviewed the data as listed CMP Latest Ref Rng & Units 08/24/2021 08/23/2021 08/22/2021  Glucose 70 - 99 mg/dL 86 86 87  BUN 8 - 23 mg/dL '21 19 17  ' Creatinine 0.61 - 1.24 mg/dL 0.93 0.82 0.90  Sodium 135 - 145 mmol/L 136 137 137  Potassium 3.5 - 5.1 mmol/L 4.0 3.7 3.9  Chloride 98 - 111 mmol/L 95(L) 95(L) 97(L)  CO2 22 - 32 mmol/L 35(H) 34(H) 35(H)  Calcium 8.9 - 10.3 mg/dL 8.4(L) 8.6(L) 8.4(L)  Total Protein 6.5 - 8.1 g/dL 6.2(L) 6.2(L) 6.3(L)  Total Bilirubin 0.3 - 1.2 mg/dL 0.9 0.7 0.7  Alkaline Phos 38 - 126 U/L 88 82 72  AST 15 - 41 U/L 47(H) 47(H) 58(H)  ALT 0 - 44 U/L 161(H) 159(H) 151(H)    Lab Results  Component Value Date   WBC 9.4 08/24/2021   HGB 12.6 (L) 08/24/2021   HCT 38.4 (L) 08/24/2021   MCV 92.1 08/24/2021   PLT 148 (L) 08/24/2021   NEUTROABS 6.4 08/24/2021    CT Angio Head W or Wo Contrast  Result Date: 08/15/2021 CLINICAL DATA:  Syncope and hypoxia, large neck mass due to non-Hodgkin's  lymphoma EXAM: CT ANGIOGRAPHY HEAD AND NECK TECHNIQUE: Multidetector CT imaging of the head and neck was performed using the standard protocol during bolus administration of intravenous contrast. Multiplanar CT image reconstructions and MIPs were obtained to evaluate the vascular anatomy. Carotid stenosis measurements (when applicable) are obtained utilizing NASCET criteria, using the distal internal carotid diameter as the denominator. CONTRAST:  121m OMNIPAQUE IOHEXOL 350 MG/ML SOLN COMPARISON:  CT neck 07/09/2021 FINDINGS: CT HEAD FINDINGS Brain: There is no acute intracranial hemorrhage, extra-axial fluid collection, or acute infarct. The ventricles are normal in size. There is no mass lesion. There is no midline shift. Vascular: No hyperdense vessel or unexpected calcification. The vasculature  is assessed info below Skull: Normal. Negative for fracture or focal lesion. Sinuses: There is moderate mucosal thickening with layering fluid in the right maxillary sinus. There is surrounding hyperostosis consistent with chronic maxillary sinusitis. Orbits: The globes and orbits are unremarkable. Review of the MIP images confirms the above findings CTA NECK FINDINGS Aortic arch: The aortic arch is unremarkable. Right carotid system: The right common, internal, and external carotid arteries are patent with no significant stenosis, dissection, or aneurysm. Left carotid system: The proximal left common carotid artery is unremarkable. There is complete encasement of the common carotid artery from the level of the clavicular head through the bifurcation with complete encasement of the internal and external carotid arteries. The internal carotid artery is encased from the bifurcation through the petrous segment. The distal branches of the external carotid artery are not encased. There is no significant narrowing of the left carotid system. There is no evidence of dissection or aneurysm. There is minimal calcified atherosclerotic plaque in the carotid bulb. Vertebral arteries: The vertebral arteries are patent, with no significant stenosis, dissection, or aneurysm. Skeleton: There is multilevel degenerative change of the cervical spine. There is no acute osseous abnormality or aggressive osseous lesion. Other neck: There is a large infiltrative soft tissue mass throughout the soft tissues of the left neck. The mass measures up to approximately 8.9 cm AP by 9.3 cm TV by 13.8 cm cc, increased in size since 07/09/2021 when it measured approximately 6.6 cm TV by 6.0 cm AP by 10.0 cm cc, measured again using similar technique. Inferiorly, the mass extends into the upper mediastinum just posterior to the clavicle. The mass is inseparable from the sternocleidomastoid. As above, there is complete encasement of the carotid  vasculature without narrowing of the arteries. The airway is shifted to the right and moderately narrowed at the level of the oropharynx. Upper chest: The lungs are assessed on the separately dictated CT chest. Review of the MIP images confirms the above findings CTA HEAD FINDINGS Anterior circulation: The bilateral intracranial ICAs are patent. The bilateral MCAs are patent The bilateral ACAs are patent. There is no aneurysm. Posterior circulation: The bilateral V4 segments are patent. The basilar artery is patent. The bilateral PCAs are patent. The posterior communicating arteries are not definitely identified. Venous sinuses: There is a hypodense filling defect in the right sigmoid sinus. Anatomic variants: As above. Review of the MIP images confirms the above findings IMPRESSION: Since 07/09/2021: 1. Significant interval increase in size of the large mass in the left neck which extends from the skull base superiorly into the upper mediastinum inferiorly. The mass completely encases most of the left common carotid artery, the entire left internal carotid artery from the bifurcation to the skull base, and much of the left external carotid artery and branches. Despite the encasement, there  is no significant narrowing of the left carotid system. The mass results in significant mass effect on surrounding structures and moderate narrowing of the oropharyngeal airway. 2. Otherwise, unremarkable vasculature of the head and neck. 3. Hypodense filling defect in the right sigmoid sinus concerning for nonocclusive thrombus. 4. Chronic right maxillary sinusitis. Electronically Signed   By: Valetta Mole M.D.   On: 08/15/2021 19:24   DG Chest 2 View  Result Date: 08/15/2021 CLINICAL DATA:  Hypoxia.  Lymphoma. EXAM: CHEST - 2 VIEW COMPARISON:  07/31/2021 FINDINGS: Right arm PICC tip in the SVC 4 cm above the right atrium. Heart size is normal. The lungs are clear. No effusions. Left neck mass demonstrated with displacement  of the trachea towards the right. IMPRESSION: No active cardiopulmonary disease. PICC tip in the SVC 4 cm above the right atrium. Known left neck mass. Electronically Signed   By: Nelson Chimes M.D.   On: 08/15/2021 14:43   DG Chest 2 View  Result Date: 07/28/2021 CLINICAL DATA:  Seizure-like activity. EXAM: CHEST - 2 VIEW COMPARISON:  June 08, 2019 FINDINGS: The heart size and mediastinal contours are within normal limits. Both lungs are clear. The visualized skeletal structures are unremarkable. IMPRESSION: No active cardiopulmonary disease. Electronically Signed   By: Fidela Salisbury M.D.   On: 07/28/2021 14:51   CT Head Wo Contrast  Result Date: 07/28/2021 CLINICAL DATA:  Seizure activity, frontal scalp abrasion EXAM: CT HEAD WITHOUT CONTRAST TECHNIQUE: Contiguous axial images were obtained from the base of the skull through the vertex without intravenous contrast. COMPARISON:  None. FINDINGS: Brain: No acute infarct or hemorrhage. Lateral ventricles and midline structures are unremarkable. No acute extra-axial fluid collections. No mass effect. Vascular: No hyperdense vessel or unexpected calcification. Skull: Normal. Negative for fracture or focal lesion. Sinuses/Orbits: Mild mucosal thickening within the maxillary sinuses. Remaining paranasal sinuses are clear. Other: None. IMPRESSION: 1. No acute intracranial process. Electronically Signed   By: Randa Ngo M.D.   On: 07/28/2021 18:39   CT Angio Neck W and/or Wo Contrast  Result Date: 08/15/2021 CLINICAL DATA:  Syncope and hypoxia, large neck mass due to non-Hodgkin's lymphoma EXAM: CT ANGIOGRAPHY HEAD AND NECK TECHNIQUE: Multidetector CT imaging of the head and neck was performed using the standard protocol during bolus administration of intravenous contrast. Multiplanar CT image reconstructions and MIPs were obtained to evaluate the vascular anatomy. Carotid stenosis measurements (when applicable) are obtained utilizing NASCET  criteria, using the distal internal carotid diameter as the denominator. CONTRAST:  135m OMNIPAQUE IOHEXOL 350 MG/ML SOLN COMPARISON:  CT neck 07/09/2021 FINDINGS: CT HEAD FINDINGS Brain: There is no acute intracranial hemorrhage, extra-axial fluid collection, or acute infarct. The ventricles are normal in size. There is no mass lesion. There is no midline shift. Vascular: No hyperdense vessel or unexpected calcification. The vasculature is assessed info below Skull: Normal. Negative for fracture or focal lesion. Sinuses: There is moderate mucosal thickening with layering fluid in the right maxillary sinus. There is surrounding hyperostosis consistent with chronic maxillary sinusitis. Orbits: The globes and orbits are unremarkable. Review of the MIP images confirms the above findings CTA NECK FINDINGS Aortic arch: The aortic arch is unremarkable. Right carotid system: The right common, internal, and external carotid arteries are patent with no significant stenosis, dissection, or aneurysm. Left carotid system: The proximal left common carotid artery is unremarkable. There is complete encasement of the common carotid artery from the level of the clavicular head through the bifurcation with complete encasement of the  internal and external carotid arteries. The internal carotid artery is encased from the bifurcation through the petrous segment. The distal branches of the external carotid artery are not encased. There is no significant narrowing of the left carotid system. There is no evidence of dissection or aneurysm. There is minimal calcified atherosclerotic plaque in the carotid bulb. Vertebral arteries: The vertebral arteries are patent, with no significant stenosis, dissection, or aneurysm. Skeleton: There is multilevel degenerative change of the cervical spine. There is no acute osseous abnormality or aggressive osseous lesion. Other neck: There is a large infiltrative soft tissue mass throughout the soft  tissues of the left neck. The mass measures up to approximately 8.9 cm AP by 9.3 cm TV by 13.8 cm cc, increased in size since 07/09/2021 when it measured approximately 6.6 cm TV by 6.0 cm AP by 10.0 cm cc, measured again using similar technique. Inferiorly, the mass extends into the upper mediastinum just posterior to the clavicle. The mass is inseparable from the sternocleidomastoid. As above, there is complete encasement of the carotid vasculature without narrowing of the arteries. The airway is shifted to the right and moderately narrowed at the level of the oropharynx. Upper chest: The lungs are assessed on the separately dictated CT chest. Review of the MIP images confirms the above findings CTA HEAD FINDINGS Anterior circulation: The bilateral intracranial ICAs are patent. The bilateral MCAs are patent The bilateral ACAs are patent. There is no aneurysm. Posterior circulation: The bilateral V4 segments are patent. The basilar artery is patent. The bilateral PCAs are patent. The posterior communicating arteries are not definitely identified. Venous sinuses: There is a hypodense filling defect in the right sigmoid sinus. Anatomic variants: As above. Review of the MIP images confirms the above findings IMPRESSION: Since 07/09/2021: 1. Significant interval increase in size of the large mass in the left neck which extends from the skull base superiorly into the upper mediastinum inferiorly. The mass completely encases most of the left common carotid artery, the entire left internal carotid artery from the bifurcation to the skull base, and much of the left external carotid artery and branches. Despite the encasement, there is no significant narrowing of the left carotid system. The mass results in significant mass effect on surrounding structures and moderate narrowing of the oropharyngeal airway. 2. Otherwise, unremarkable vasculature of the head and neck. 3. Hypodense filling defect in the right sigmoid sinus  concerning for nonocclusive thrombus. 4. Chronic right maxillary sinusitis. Electronically Signed   By: Valetta Mole M.D.   On: 08/15/2021 19:24   CT Angio Chest PE W and/or Wo Contrast  Result Date: 08/15/2021 CLINICAL DATA:  Concern for pulmonary embolism. EXAM: CT ANGIOGRAPHY CHEST WITH CONTRAST TECHNIQUE: Multidetector CT imaging of the chest was performed using the standard protocol during bolus administration of intravenous contrast. Multiplanar CT image reconstructions and MIPs were obtained to evaluate the vascular anatomy. CONTRAST:  118m OMNIPAQUE IOHEXOL 350 MG/ML SOLN COMPARISON:  Chest CT dated 07/31/2021. FINDINGS: Cardiovascular: There is no cardiomegaly or pericardial effusion. The thoracic aorta is unremarkable. Evaluation of the pulmonary arteries is limited due to respiratory motion artifact and suboptimal visualization of the peripheral branches. No large or central pulmonary artery embolus identified. Right sided PICC now extends superiorly into the right IJ. The PICC appears to bend in the right IJ and turns downward. The tip of the PICC appears to be in the right IJ at the level of the subclavian vein (33/6 and coronal 91/7). Evaluation however is limited due  to presence of intravenous contrast. Mediastinum/Nodes: No hilar or mediastinal adenopathy. The esophagus is grossly unremarkable. Partially visualized mass in the left side of the neck measures 9.1 x 8.4 cm and increase in size since the study of 07/09/2021 (previously measuring approximately 7.1 x 6.0 cm). There is associated mass effect and deviation of the trachea to the right. No mediastinal fluid collection. Lungs/Pleura: No focal consolidation, pleural effusion, or pneumothorax. The central airways are patent. Upper Abdomen: No acute abnormality. Musculoskeletal: Degenerative changes of the spine. No acute osseous pathology. Review of the MIP images confirms the above findings. IMPRESSION: 1. No CT evidence of central  pulmonary artery embolus. 2. Right sided PICC now extends superiorly into the right IJ. The tip of the PICC appears to be in the right IJ at the level of the subclavian vein. Evaluation however is limited due to presence of intravenous contrast. 3. Partially visualized mass in the left side of the neck has increased in size since the study of 07/09/2021 and is concerning for malignancy. These results were called by telephone at the time of interpretation on 08/15/2021 at 7:39 pm to Dr. Gilford Raid, who verbally acknowledged these results. Electronically Signed   By: Anner Crete M.D.   On: 08/15/2021 19:49   CT Angio Chest PE W and/or Wo Contrast  Addendum Date: 07/31/2021   ADDENDUM REPORT: 07/31/2021 19:50 ADDENDUM: These results were called by telephone at the time of interpretation on 07/31/2021 at 7:48 pm to provider JOSEPH ZAMMIT , who verbally acknowledged these results. Electronically Signed   By: Iven Finn M.D.   On: 07/31/2021 19:50   Result Date: 07/31/2021 CLINICAL DATA:  PE suspected, low/intermediate prob, neg D-dimer EXAM: CT ANGIOGRAPHY CHEST WITH CONTRAST TECHNIQUE: Multidetector CT imaging of the chest was performed using the standard protocol during bolus administration of intravenous contrast. Multiplanar CT image reconstructions and MIPs were obtained to evaluate the vascular anatomy. CONTRAST:  113m OMNIPAQUE IOHEXOL 350 MG/ML SOLN COMPARISON:  None. FINDINGS: Cardiovascular: Satisfactory opacification of the pulmonary arteries to the segmental level. Segmental and subsegmental right lower lobe pulmonary embolus. Segmental and subsegmental right middle lobe pulmonary embolus. Limited evaluation of left lower lobe with question subsegmental pulmonary emboli. Normal heart size. No pericardial effusion. Mediastinum/Nodes: No enlarged mediastinal, hilar, or axillary lymph nodes. Thyroid gland, trachea, and esophagus demonstrate no significant findings. Lungs/Pleura: Limited  evaluation due to motion artifact. Expiratory phase of respiration. Mosaic attenuation of the lungs. No focal consolidation. No pulmonary nodule. No pulmonary mass. No pleural effusion. No pneumothorax. Upper Abdomen: Possible mild hepatic steatosis. Musculoskeletal: No chest wall abnormality. No suspicious lytic or blastic osseous lesions. No acute displaced fracture. Multilevel degenerative changes of the spine. Review of the MIP images confirms the above findings. IMPRESSION: Segmental and subsegmental right lower and middle lobe pulmonary emboli. Question left lower lobe subsegmental pulmonary emboli. No findings of right heart strain or pulmonary infarction. Electronically Signed: By: MIven FinnM.D. 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 EXAM: NUCLEAR MEDICINE PET SKULL BASE TO THIGH TECHNIQUE: 16.9 mCi F-18 FDG was injected intravenously. Full-ring PET imaging was performed from the skull base to thigh after the radiotracer. CT data was obtained and used for attenuation correction and anatomic localization. Fasting blood glucose: 121 mg/dl COMPARISON:  None FINDINGS: Mediastinal blood pool activity: SUV max 3.78 Liver activity: SUV max 4.33 NECK: There is a large  nodal mass within the left cervical chain extending from the level 2 region through level 4 and 5. This measures the 9.6 x 8.5 by 13.9 cm and has an SUV max 26.47, image 43/4. The tumor extends into the superior mediastinum along with the left paratracheal region, image 56/4. There is also tumor extension into the prevertebral soft tissue space at the level of the oropharynx with involvement of the right tonsillar fossa. Tumor also extends into the prevertebral soft tissues at the level of the hypopharynx with mass effect upon the hypopharynx with deviation towards the right. Incidental CT findings: none  CHEST: Mild FDG uptake associated with 9 mm subcarinal lymph node within SUV max of 3.68, image 83/4. No FDG avid pulmonary nodule. Incidental CT findings: Mild aortic atherosclerosis and coronary artery calcifications. No pleural effusion. ABDOMEN/PELVIS: No abnormal FDG uptake identified within the liver. Within the neck of pancreas there is a approximately 1.4 cm focus of increased uptake with SUV max of 7.88. Within the body of the pancreas there is a focus of increased uptake measuring 2 cm with SUV max of 14.0. Normal size spleen without focal areas of increased uptake. Adrenal glands appear normal. No FDG avid lymph nodes within the abdomen or pelvis. FDG avid right inguinal node measures 7 mm within SUV max of 6.96, image 199/4. Incidental CT findings: Large fat containing umbilical hernia measures 8.0 x 6.3 cm. Small bilateral fat containing inguinal hernias. SKELETON: There are a few scattered FDG avid bone lesions, without corresponding CT changes. For example, within the left humeral head there is a moderate to intense focus of increased uptake within SUV max of 9.87. Within the transverse process the T10 vertebra on the left there is a focus of increased uptake within SUV max of 8.85. Within the inferior endplate of the L2 vertebral body there is a small focus of increased uptake with SUV max of 8.27. Incidental CT findings: none IMPRESSION: 1. Examination is positive for large FDG avid mass within the left neck extending from the level 2 nodal chain into the left side of the superior mediastinum compatible with metabolically active tumor. 2. There are 2 foci of increased uptake within the neck and body of pancreas lymphomatous involvement of the pancreas. 3. Subcentimeter FDG avid right inguinal lymph node also concerning for lymphoma. 4. Several foci of increased uptake within the axial skeleton compatible with malignancy. Electronically Signed   By: Kerby Moors M.D.   On: 08/14/2021 06:22   US  Venous Img Lower Bilateral (DVT)  Result Date: 08/01/2021 CLINICAL DATA:  Pulmonary emboli on CTA chest 07/31/2021. Lower extremity edema. EXAM: Bilateral LOWER EXTREMITY VENOUS DOPPLER ULTRASOUND TECHNIQUE: Gray-scale sonography with compression, as well as color and duplex ultrasound, were performed to evaluate the deep venous system(s) from the level of the common femoral vein through the popliteal and proximal calf veins. COMPARISON:  None. FINDINGS: VENOUS Left side: Substantial thrombus in the superficial femoral vein and profundus femoral vein on the left, with no significant compressibility, partially occlusive but not completely occlusive. This extends into the posterior tibial veins and peroneal veins as well as the popliteal vein. The common femoral vein appears patent. Right side: Normal appearance of the common femoral vein, superficial femoral vein, popliteal vein, and calf veins on the right. Expected compressibility and augmentation. OTHER None. Limitations: none IMPRESSION: 1. Positive for left-sided DVT with left superficial femoral vein, profundus femoral vein, popliteal vein, and calf vein thrombosis on the left side, in this patient with  recent diagnosis of acute pulmonary embolus. No findings of DVT on the right side. Electronically Signed   By: Van Clines M.D.   On: 08/01/2021 15:50   DG Chest Port 1 View  Result Date: 07/31/2021 CLINICAL DATA:  Wheezing, history of COVID positive. EXAM: PORTABLE CHEST 1 VIEW COMPARISON:  July 28, 2021 FINDINGS: The heart size and mediastinal contours are within normal limits. Both lungs are clear. The visualized skeletal structures are unremarkable. IMPRESSION: No active disease. Electronically Signed   By: Virgina Norfolk M.D.   On: 07/31/2021 16:22   DG Swallowing Func-Speech Pathology  Result Date: 08/22/2021 Table formatting from the original result was not included. Objective Swallowing Evaluation: Type of Study: MBS-Modified  Barium Swallow Study  Patient Details Name: BRANDONN CAPELLI MRN: [id number] Date of Birth: 1956-05-29 Today's Date: 08/22/2021 Time: SLP Start Time (ACUTE ONLY): 1510 -SLP Stop Time (ACUTE ONLY): 1527 SLP Time Calculation (min) (ACUTE ONLY): 17 min Past Medical History: Past Medical History: Diagnosis Date  Anxiety   Arthritis   Cancer ([place])   COVID   x 2 (2020 and 2022)  Depression   Hypertension  Past Surgical History: Past Surgical History: Procedure Laterality Date  carpel tunnel    MASS BIOPSY Left 07/14/2021  Procedure: NECK MASS BIOPSY;  Surgeon: Melida Quitter, MD;  Location: [facility];  Service: ENT;  Laterality: Left; HPI: Joe [name] is a 65 y.o. 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 Most of the history was obtained from niece at bedside, per report, patient has had about 4 episodes of syncope within last week.  First episode lasted several hours, he was taken to a hospital in [place], he was evaluated for syncope and noted to have elevated CO2 level per ED medical record (10/8).  Patient has since had 3 other episodes, lasted few seconds to a minute with return to baseline within few minutes and without any postictal state. EMS was activated, BP was checked and was hypotensive at 85 and this was associated with diaphoresis. In the ED, noted to be HD stable. Work-up in the ED showed elevated creatinine at 1.41 (baseline creatinine at 1.1-1.3), D-dimer 3.59.  Influenza A, B, SARS coronavirus 2 was negative. CTA chest showed PE. No findings of right heart strain or pulmonary infarction. Patient was started on heparin drip. Pt reports poor oral intake due to mass in neck and fear of choking.  Pt was eating chopped food prior to admit.  He reports decreased edema due to chemotherapy with  improvement. Pt on a puree/thin diet - hoping for diet advancement.  Subjective: pt awake in wheelchair, ready for swallow evaluation Assessment / Plan / Recommendation [facility] IP CLINICAL IMPRESSIONS 08/22/2021 Clinical Impression Pt presents with min oropharyngeal dysphagia mostly c/b decreased oral coordination with premature spillage of liquids into pharynx to pyriform sinus with thin.  Pharyngeal swallow is strong without retention with good stripping wave despite edema.  Minimally decreased laryngeal closure (suspect due to edema) allows minimal laryngeal penetration of thin liquids. No aspiration observed during the study despite pt being tested with sequential swallows.  Pt did cough x2 - once due to trace oral retention of liquids and secretions spilling into larynx post-swallow.  Pt did not transit tablet with thin liquids despite 2 trials, but  easily transited with pudding.  Tablet appeared to transiently halt at LES - without pt awareness.  Additional liquid swallow faciliated tablet transiting into his stomach.  Recommend to advance diet to dys3/thin - medicine with puree.  SLP will follow up with pt for education/po tolerance/compensation strategy effectiveness.  Recommend follow aspiration and reflux precautions. Oral care important due to suspected secretion aspiration and pt's chemo treatment impact on immune system increasing risk of pna if he aspirate. SLP Visit Diagnosis Dysphagia, oropharyngeal phase (R13.12) Attention and concentration deficit following -- Frontal lobe and executive function deficit following -- Impact on safety and function Mild aspiration risk   [facility] IP TREATMENT RECOMMENDATION 08/22/2021 Treatment Recommendations Therapy as outlined in treatment plan below   Prognosis 08/22/2021 Prognosis for Safe Diet Advancement Good Barriers to Reach Goals -- Barriers/Prognosis Comment -- [facility] IP DIET RECOMMENDATION 08/22/2021 SLP Diet Recommendations Dysphagia 3 (Mech soft) solids;Thin liquid  Liquid Administration via Cup;Straw Medication Administration Whole meds with puree Compensations Slow rate;Small sips/bites Postural Changes Remain semi-upright after after feeds/meals (Comment);Seated upright at 90 degrees   [facility] IP OTHER RECOMMENDATIONS 08/22/2021 Recommended Consults -- Oral Care Recommendations Oral care BID Other Recommendations --   [facility] IP FOLLOW UP RECOMMENDATIONS 08/22/2021 Follow up Recommendations (No Data)   [facility] IP FREQUENCY AND DURATION 08/22/2021 Speech Therapy Frequency (ACUTE ONLY) min 1 x/week Treatment Duration 1 week      [facility] IP ORAL PHASE 08/22/2021 Oral Phase Impaired Oral - Pudding Teaspoon -- Oral - Pudding Cup -- Oral - Honey Teaspoon -- Oral - Honey Cup -- Oral - Nectar Teaspoon -- Oral - Nectar Cup -- Oral - Nectar Straw Premature spillage Oral - Thin Teaspoon -- Oral - Thin Cup Premature spillage Oral - Thin Straw Premature spillage Oral - Puree WFL Oral - Mech Soft WFL Oral - Regular -- Oral - Multi-Consistency -- Oral - Pill Reduced posterior propulsion Oral Phase - Comment --  [facility] IP PHARYNGEAL PHASE 08/22/2021 Pharyngeal Phase Impaired Pharyngeal- Pudding Teaspoon -- Pharyngeal -- Pharyngeal- Pudding Cup -- Pharyngeal -- Pharyngeal- Honey Teaspoon -- Pharyngeal -- Pharyngeal- Honey Cup -- Pharyngeal -- Pharyngeal- Nectar Teaspoon -- Pharyngeal -- Pharyngeal- Nectar Cup -- Pharyngeal -- Pharyngeal- Nectar Straw Delayed swallow initiation-vallecula Pharyngeal Material does not enter airway Pharyngeal- Thin Teaspoon -- Pharyngeal -- Pharyngeal- Thin Cup Delayed swallow initiation-pyriform sinuses Pharyngeal Material enters airway, CONTACTS cords and then ejected out Pharyngeal- Thin Straw Delayed swallow initiation-pyriform sinuses Pharyngeal Material enters airway, CONTACTS cords and then ejected out Pharyngeal- Puree WFL Pharyngeal Material does not enter airway Pharyngeal- Mechanical Soft Delayed swallow initiation-vallecula Pharyngeal Material does not enter airway  Pharyngeal- Regular -- Pharyngeal -- Pharyngeal- Multi-consistency -- Pharyngeal -- Pharyngeal- Pill Delayed swallow initiation-vallecula Pharyngeal Material does not enter airway Pharyngeal Comment Pt with minimal laryngeal penetration of thin liquids due to minimally decreased laryngeal closure - no aspiration noted; cough noted x2 during testing - suspect some secretion aspiration.  Swallow triggers with liquids at pyriform sinus as boluses spill to pyriforms prior to swallow trigger. Pharyngeal swallow is strong without retention - with good stripping wave despite edema.  Barium tablet with pudding transited through pharynx into esophagus - but appeared to stall at LES - without pt awareness.  Additional liquid swallow appeared to push tablet into stomach.  Recommend follow aspiration and reflux precautions.  [facility] IP CERVICAL ESOPHAGEAL PHASE 08/22/2021 Cervical Esophageal Phase Impaired Pudding Teaspoon -- Pudding Cup -- Honey Teaspoon -- Honey Cup -- Nectar Teaspoon -- Nectar Cup -- Nectar Straw -- Thin  Teaspoon -- Thin Cup -- Thin Straw -- Puree -- Mechanical Soft -- Regular -- Multi-consistency -- Pill -- Cervical Esophageal Comment -- Kathleen Lime, MS [facility] SLP Acute Rehab Services Office [phone] Pager [phone] Macario Golds 08/22/2021, 5:03 PM              ECHOCARDIOGRAM COMPLETE  Result Date: 08/01/2021    ECHOCARDIOGRAM REPORT   Patient Name:   JAEQUAN PROPES Date of Exam: 08/01/2021 Medical Rec #:  [id number]       Height:       69.0 in Accession #:    [id number]      Weight:       323.4 lb Date of Birth:  1956/06/24       BSA:          2.534 m Patient Age:    59 years        BP:           149/82 mmHg Patient Gender: M               HR:           95 bpm. Exam Location:  [facility] Procedure: 2D Echo, Cardiac Doppler and Color Doppler Indications:    Pulmonary Embolus I26.09  History:        Patient has no prior history of Echocardiogram examinations.                 Risk Factors:Hypertension  and Dyslipidemia. Obesity. OSA                 (obstructive sleep apnea). Covid-19 infection. Cancer ([place])                 (From Hx).  Sonographer:    Alvino Chapel RCS Referring Phys: [id number] [place]  1. Left ventricular ejection fraction, by estimation, is 60 to 65%. The left ventricle has normal function. The left ventricle has no regional wall motion abnormalities. Left ventricular diastolic parameters were normal.  2. Right ventricular systolic function is normal. The right ventricular size is normal. Tricuspid regurgitation signal is inadequate for assessing PA pressure.  3. The mitral valve is grossly normal. No evidence of mitral valve regurgitation. No evidence of mitral stenosis.  4. The aortic valve is tricuspid. Aortic valve regurgitation is not visualized. No aortic stenosis is present.  5. The inferior vena cava is normal in size with greater than 50% respiratory variability, suggesting right atrial pressure of 3 mmHg. Conclusion(s)/Recommendation(s): Normal biventricular function without evidence of hemodynamically significant valvular heart disease. FINDINGS  Left Ventricle: Left ventricular ejection fraction, by estimation, is 60 to 65%. The left ventricle has normal function. The left ventricle has no regional wall motion abnormalities. The left ventricular internal cavity size was normal in size. There is  no left ventricular hypertrophy. Left ventricular diastolic parameters were normal. Right Ventricle: The right ventricular size is normal. No increase in right ventricular wall thickness. Right ventricular systolic function is normal. Tricuspid regurgitation signal is inadequate for assessing PA pressure. Left Atrium: Left atrial size was normal in size. Right Atrium: Right atrial size was normal in size. Pericardium: Trivial pericardial effusion is present. Presence of pericardial fat pad. Mitral Valve: The mitral valve is grossly normal. No evidence of mitral valve  regurgitation. No evidence of mitral valve stenosis. Tricuspid Valve: The tricuspid valve is grossly normal. Tricuspid valve regurgitation is trivial. No evidence of tricuspid stenosis. Aortic Valve: The aortic valve is tricuspid. Aortic  valve regurgitation is not visualized. No aortic stenosis is present. Aortic valve mean gradient measures 8.0 mmHg. Aortic valve peak gradient measures 16.5 mmHg. Aortic valve area, by VTI measures 2.63  cm. Pulmonic Valve: The pulmonic valve was grossly normal. Pulmonic valve regurgitation is not visualized. No evidence of pulmonic stenosis. Aorta: The aortic root is normal in size and structure. Venous: The right lower pulmonary vein is normal. The inferior vena cava is normal in size with greater than 50% respiratory variability, suggesting right atrial pressure of 3 mmHg. IAS/Shunts: The atrial septum is grossly normal.  LEFT VENTRICLE PLAX 2D LVIDd:         4.50 cm   Diastology LVIDs:         2.70 cm   LV e' medial:    11.10 cm/s LV PW:         1.10 cm   LV E/e' medial:  7.6 LV IVS:        1.10 cm   LV e' lateral:   12.30 cm/s LVOT diam:     2.00 cm   LV E/e' lateral: 6.8 LV SV:         97 LV SV Index:   38 LVOT Area:     3.14 cm  RIGHT VENTRICLE RV S prime:     21.10 cm/s TAPSE (M-mode): 3.2 cm LEFT ATRIUM             Index        RIGHT ATRIUM           Index LA diam:        3.60 cm 1.42 cm/m   RA Area:     11.70 cm LA Vol (A2C):   33.8 ml 13.34 ml/m  RA Volume:   26.20 ml  10.34 ml/m LA Vol (A4C):   48.7 ml 19.22 ml/m LA Biplane Vol: 42.7 ml 16.85 ml/m  AORTIC VALVE AV Area (Vmax):    2.62 cm AV Area (Vmean):   2.82 cm AV Area (VTI):     2.63 cm AV Vmax:           203.00 cm/s AV Vmean:          135.000 cm/s AV VTI:            0.371 m AV Peak Grad:      16.5 mmHg AV Mean Grad:      8.0 mmHg LVOT Vmax:         169.00 cm/s LVOT Vmean:        121.000 cm/s LVOT VTI:          0.310 m LVOT/AV VTI ratio: 0.84  AORTA Ao Root diam: 3.40 cm MITRAL VALVE MV Area (PHT): 2.83  cm     SHUNTS MV Decel Time: 268 msec     Systemic VTI:  0.31 m MV E velocity: 83.90 cm/s   Systemic Diam: 2.00 cm MV A velocity: 112.00 cm/s MV E/A ratio:  0.75 Eleonore Chiquito MD Electronically signed by Eleonore Chiquito MD Signature Date/Time: 08/01/2021/4:20:57 PM    Final    IR PICC PLACEMENT RIGHT >5 YRS INC IMG GUIDE  Result Date: 08/14/2021 INDICATION: Recent diagnosis of lymphoma. Request made for placement of a PICC line for the initiation of chemotherapy Note, initial plan was to proceed with port a catheter placement however the patient has recently been diagnosed with DVT and pulmonary embolism and referring oncologist has requested a PICC line initially as the patient is begun on anticoagulation. EXAM:  ULTRASOUND AND FLUOROSCOPIC GUIDED PICC LINE INSERTION MEDICATIONS: None. CONTRAST:  None FLUOROSCOPY TIME:  18 seconds (2 mGy) COMPLICATIONS: None immediate. TECHNIQUE: The procedure, risks, benefits, and alternatives were explained to the patient's niece and informed written consent was obtained. A timeout was performed prior to the initiation of the procedure. The right upper extremity was prepped with chlorhexidine in a sterile fashion, and a sterile drape was applied covering the operative field. Maximum barrier sterile technique with sterile gowns and gloves were used for the procedure. A timeout was performed prior to the initiation of the procedure. Local anesthesia was provided with 1% lidocaine. Under direct ultrasound guidance, the brachial vein was accessed with a micropuncture kit after the overlying soft tissues were anesthetized with 1% lidocaine. Real-time ultrasound guidance was utilized for vascular access including the acquisition of a permanent ultrasound image documenting patency of the accessed vessel. A guidewire was advanced to the level of the superior caval-atrial junction for measurement purposes and the PICC line was cut to length. A peel-away sheath was placed and a 40 cm, 5  [place], dual lumen was inserted to level of the superior caval-atrial junction. A post procedure spot fluoroscopic was obtained. The catheter easily aspirated and flushed and was secured in place with stat lock device. A dressing was applied. The patient tolerated the procedure well without immediate post procedural complication. FINDINGS: After catheter placement, the tip lies within the superior cavoatrial junction. The catheter aspirates and flushes normally and is ready for immediate use. IMPRESSION: Successful ultrasound and fluoroscopic guided placement of a right brachial vein approach, 40 cm, 5 French, dual lumen PICC with tip at the superior caval-atrial junction. The PICC line is ready for immediate use. Electronically Signed   By: Sandi Mariscal M.D.   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 Based on limited sampling the patient's pathology reveals a grade 1 through 2 follicular lymphoma with a Ki-67 of 25%.  There was a lot of crush artifact in the sample and therefore a high-grade process could not be ruled out. However the predominant element of his mass has grown significantly over the last 1 month which is concerning for a high-grade follicular lymphoma or follicular lymphoma with transformation to large B-cell lymphoma.   CT neck imaging shows concern with compression of left internal jugular vein with mild mass-effect on the airway in the neck and the left carotid is inseparable from the mass.   High risk of syncopal episodes if there is compression of the common carotid baroreceptors.  -PET scan performed 08/10/2021- "1. Examination is positive for large FDG avid mass within the left neck extending from the level 2 nodal chain into the left side of the superior mediastinum compatible with metabolically active tumor. 2. There are 2 foci of increased uptake within the neck and  body of pancreas lymphomatous involvement of the pancreas. 3. Subcentimeter FDG avid right inguinal lymph node also concerning for lymphoma. 4. Several foci of increased uptake within the axial skeleton compatible with malignancy."   2) HTN   3) Obstructive sleep apnea not on CPAP machine.  This is causing sleep issues and possible cognitive issues.  4) pulmonary emboli-likely related to his lymphoma plus obesity as risk factor plus possible some venous compression.  5) recurrent syncope-likely from either carotid arterial compression or vagal nerve compression.  CTA did not show any overt carotid  stenosis at this time however there can be positional compression of the carotid artery.  6) transaminitis  PLAN: -Status post cycle #1 of CHOP on 08/18/2021.  Overall tolerated well with the exception of some mild flushing and hot sensation over his neck and face with Rituxan.  He notes that the mass is smaller and less painful.  He also notices that the mass is softer. -Prednisone 68m po daily D2-D6 post chemotherapy.  Completed. -cannot do Udenyca as inpatient so -- will need Granix 480 mcg daily x 5 days starting 24 hours post chemotherapy (will start today).  Due for his last dose of Granix today.  Currently scheduled for 6 PM this evening but will request that this be moved up to this morning. -Continue allopurinol due to high risk for tumor lysis syndrome. -Would recommend some permissive hypertension up to systolics of 1830-940to avoid syncopal episodes with vagal compression.  -Patient was recommended to make sure he stays well-hydrated since hypovolemia will worsen potential risk for syncope. -PICC line will need to remain in place upon discharge.  May need home health to flush PICC line 3 times a week and change dressing once a week.  If cannot arrange this, recommend that he come to our office to have this done.   -PT evaluation completed.  No further follow-up recommended. -He has  outpatient follow-up already scheduled on 11/7.  Second cycle of chemotherapy scheduled for 11/18. -I have reached out to the hospitalist regarding his low O2 sats this morning.  Recommend that we confirm that he has home oxygen at night and CPAP machine.  Future Appointments  Date Time Provider [place] 08/27/2021 10:00 AM [facility] None  08/27/2021 10:40 AM KBrunetta Genera MD [facility] None  09/07/2021  9:30 AM [facility] None  09/07/2021 10:00 AM KBrunetta Genera MD [facility] None  09/07/2021 11:00 AM [facility] INFUSION [facility] None  09/07/2021 12:00 PM CMorrell Riddle RD [facility] None  09/10/2021 11:15 AM [facility] None     ADDENDUM  Patient was Personally and independently interviewed, examined and relevant elements of the history of present illness were reviewed in details and an assessment and plan was created. All elements of the patient's history of present illness , assessment and plan were discussed in details with KMikey BussingDNP. The above documentation reflects our combined findings assessment and plan. Concern for severe untreated sleep apnea with nocturnal severe desaturations. Would recommend inpatient pulmonary consultation for recommendations regarding home oxygen and for expedited sleep study and management of sleep apnea.  GSullivan LoneMD MS

## 2021-08-24 NOTE — Progress Notes (Signed)
SATURATION QUALIFICATIONS: (This note is used to comply with regulatory documentation for home oxygen)  Patient Saturations on Room Air at Rest = 93%  Patient Saturations on Room Air while Ambulating = 92%  Patient Saturations on 2 Liters of oxygen while Ambulating = 97%  Please briefly explain why patient needs home oxygen: Pt has Obstructive sleep apnea and gets very SOB with his left mass on the neck while ambulating during the day.

## 2021-08-24 NOTE — Telephone Encounter (Signed)
I LM for RT to schedule PFT at [facility] as per guidelines. Pt's HST will be scheduled as soon as possible per guidelines.

## 2021-08-24 NOTE — TOC Progression Note (Signed)
Transition of Care [facility]) - Progression Note    Patient Details  Name: Joe [name] MRN: [id number] Date of Birth: 02/18/1956  Transition of Care [facility]) CM/SW Contact  Anmol Paschen, Juliann Pulse, RN Phone Number: 08/24/2021, 1:34 PM  Clinical Narrative: Refer to prior note in addition. [facility]no HHRN to available for services.      Expected Discharge Plan: [place] Barriers to Discharge: Continued Medical Work up  Expected Discharge Plan and Services Expected Discharge Plan: [place]   Discharge Planning Services: CM Consult   Living arrangements for the past 2 months: Mobile Home                                       Social Determinants of Health (SDOH) Interventions    Readmission Risk Interventions Readmission Risk Prevention Plan 08/23/2021  Transportation Screening Complete  PCP or Specialist Appt within 3-5 Days Complete  HRI or [place] Complete  Social Work Consult for [place] Planning/Counseling Complete  Palliative Care Screening Not Applicable  Medication Review [employer]) Complete  Some recent data might be hidden

## 2021-08-24 NOTE — Telephone Encounter (Signed)
Dr. Verlee Monte -   I scheduled this patient with the next available seep doc appt - Dr. Halford Chessman on 10/24/2021 @ 9:30 AM.   The HST has been scheduled with the [facility] for 09/07/21 at 7:30 PM for pt to pick up the device at [facility].  They check in at the ED for that appointment.   RT will contact the patient to schedule the PFT at [facility].

## 2021-08-24 NOTE — Progress Notes (Signed)
Pt refused cpap tonight and said he was asked earlier too and he does not want it. Pt is placed on overnight pulse ox at this time.

## 2021-08-24 NOTE — Progress Notes (Signed)
PROGRESS NOTE    Joe [name]  ZOX:[id number] DOB: Oct 11, 1956 DOA: 08/15/2021 PCP: Celene Squibb, MD  Brief Narrative:  The patient is a 65 year old morbidly obese Caucasian male with a past medical history significant for but limited to hypertension, obstructive sleep apnea as well as other comorbidities who presented with recurrent episodes of syncope.  He is recently diagnosed with follicular lymphoma and has a PE that was also diagnosed 2 weeks ago.  He reported losing consciousness after experiencing left-sided neck pain.  On initial examination his blood pressure was normal at 120/73.  He was found to have a large soft tissue mass in the left side of the neck that radiated from the angle of the mandible to the posterior cervical spinous process.  His syncope was thought to be due to his left large lymphoma with mass-effect on vascular structures as well as airway provoking thoracic outlet obstruction and this required urgent treatment.  Hematology oncology was consulted and he started on urgent chemotherapy with CHOP on 07/29/2021.  He received a dose of rituximab on 08/20/2021.  Currently PT OT tto further evaluate and treat and patient is doing better and thinks his neck mass is not as swollen and requesting diet advancement so we will consult SLP for further evaluation.   SLP placed the patient on a dysphagia 3 diet.  Patient has 1 more day of [facility] and medical oncology recommending PICC line at discharge.  They are recommending a TOC consult for PICC line supplies and PICC flushes by home health RN as well as home health PT.  They are arranging follow-up with for 08/27/2021.  Patient can likely be discharged home tomorrow and he will start his second cycle of chemotherapy on 09/07/2021.  TOC is having a hard time obtaining home health services for the patient and medical oncology recommends that they try to arrange for home health nurse to flush the PICC 3 times a week and then change his  dressing once a week but this cannot be arranged at they are recommending the patient come to the oncology office to have this done.   Given his nocturnal desaturations and extremely low O2 saturation pulmonary was consulted and they recommended obtaining an ambulatory home O2 screen and doing an overnight pulse oximetry study.  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 Examination is positive for large FDG avid mass within the left neck extending from the level 2 nodal chain into the left side of the superior mediastinum compatible with metabolically active tumor. 2. There are 2 foci of increased uptake within the neck and body of pancreas lymphomatous involvement of the pancreas. 3. Subcentimeter FDG avid right inguinal lymph node also concerning for lymphoma. 4. Several foci of  increased uptake within the axial skeleton compatible with malignancy" -CT of the neck showed concern with compression of the left internal jugular vein with mild mass-effect on the airway and the neck and left carotid which is inseparable from the mass -Medical oncology has been consulted for further evaluation recommendations; he is initiated on CHOP  Chemotherapy 08/18/2021 and tolerated well -Patient also received rituximab on 08/20/21 and getting 60 mg of prednisone p.o. daily from Day 2-Day 6 s/p Chemotherapy -Per medical oncology patient cannot do Udenyca as Inpatient so is also continuing to get TBL-filgrastim 480 mcg subcu daily x5 days  -We will continue Allopurinol 300 mg po Daily due to High Risk for tumor lysis syndrome -We will obtain SLP evaluation for further diet advancement as he is on a dysphagia 1 diet and continue with sodium bicarbonate/sodium chloride mouthwashes; diet has now been advanced to dysphagia 3 diet -Repeat CMP and CBC in a.m. -PT and OT to further evaluate and treat and they are recommending no follow-up -Patient has a follow-up appointment with Dr. Irene Limbo on 08/27/2021  Leukocytosis  -Patient's WBC went from 8.1 -> 9.1 -> 10.2 -> 20.6 -> 24.7 and is now 19.0 yesterday and today it is 9.4 normalized -Increased in the setting of steroid demargination with prednisone as well as TPO-filgrastim -Continue to monitor for signs and symptoms of infection -Repeat CBC in a.m.  PE in the setting of malignancy -He is anticoagulated with enoxaparin 120 mg subcu every 12  Essential HTN -C/w Amlodipine 10 mg po Daily -Continue to Monitor blood pressures per protocol -Last blood pressure reading was 119/70  HLD/Dyslipidemia -C/w Simvastatin 40 mg po 3x weekly (MWF)  Depression and Anxiety -Chronic and stable -No evidence of any SI or HI -Continue with Sertraline 50 mg p.o. daily  Morbid Obesity/Class III Obesity -Complicates overall prognosis and care -Estimated body mass index is 46.65 kg/m as calculated from the following:   Height as of this encounter: 5\' 9"  (1.753 m).   Weight as of this encounter: 143.3 kg. -Weight Loss and Dietary Counseling given  -He will need an outpatient sleep study given concern for sleep apnea  -He is not agreeable to CPAP and will continue supplemental oxygen via nasal cannula as  needed -Dr. Ouida Sills had recommended positioning in bed pillows to limit airway obstruction while sleeping  Suspected OSA due to obesity hypoventilation syndrome and likely chronic respiratory hypercapnic respiratory failure Acute respiratory failure with hypoxia in the setting of nocturnal dyspnea and desaturations -AM ABG done and showed     Component Value Date/Time   PHART 7.371 08/22/2021 0731   PCO2ART 61.4 (H) 08/22/2021 0731   PO2ART 97.6 08/22/2021 0731   HCO3 34.7 (H) 08/22/2021 0731   O2SAT 97.5 08/22/2021 0731  -We will need an outpatient sleep study and pulmonary has been consulted for expedited evaluation of his OSA and they think that the home sleep study will be discussed with sharp to PAP which has been ordered -Patient will need PFTs -Patient desaturated significantly to the 50s yesterday overnight as his nocturnal oxygen came off -Ambulatory home O2 screen done and did not desaturate while ambulating but will order an overnight oximetry study to evaluate how much the patient needs for discharge home  DVT prophylaxis: Enoxaparin 120 mg subcu every 12 Code Status: FULL CODE Family Communication: No family present at bedside  Disposition Plan: Pending further clinical improvement and clearance by specialists as he will need a safe discharge disposition to be determined.  Anticipating discharging  home in the a.m. after his overnight oximetry study  Status is: Inpatient  Remains inpatient appropriate because: He still needs PT OT to evaluate for safe discharge disposition and clearance by medical oncology  Consultants:  Medical Oncology  Procedures: Chemotherapy CHOP on 08/18/21  Antimicrobials:  Anti-infectives (From admission, onward)    None        Subjective: Seen and examined at bedside he states that he felt very fatigued and winded after getting out of the bathroom and exerting himself.  Denies any chest pain or shortness breath.  States that his oxygen  fell off last night and when the medical oncology PA found him he was saturating in the 50s.  Pulmonary consulted and they recommend that he has a high probability of having OSA given his neck compression from his massive lymphadenopathy and the we will be arranging outpatient sleep study but they are recommending inpatient oximetry study overnight to try and qualify the patient for oxygen.  No other concerns or complaints at this time and will anticipate discharging home in the morning.  Objective: Vitals:   08/23/21 2048 08/24/21 0500 08/24/21 0620 08/24/21 1429  BP: 132/71  139/88 119/70  Pulse: (!) 102  86 100  Resp: 16  18 17   Temp: 98 F (36.7 C)  97.9 F (36.6 C) 98.1 F (36.7 C)  TempSrc: Oral  Oral Oral  SpO2: 93%  94% 95%  Weight:  (!) 143.3 kg    Height:        Intake/Output Summary (Last 24 hours) at 08/24/2021 1633 Last data filed at 08/24/2021 1300 Gross per 24 hour  Intake 840 ml  Output --  Net 840 ml    Filed Weights   08/21/21 0613 08/23/21 0558 08/24/21 0500  Weight: (!) 144 kg (!) 144 kg (!) 143.3 kg   Examination: Physical Exam:  Constitutional: WN/WD morbidly obese Caucasian male currently no acute distress appears calm Eyes: Lids and conjunctivae normal, sclerae anicteric  ENMT: External Ears, Nose appear normal. Grossly normal hearing. Mucous membranes are moist.  Neck: Has a large mass in the left side of his neck Respiratory: Diminished to auscultation bilaterally, no wheezing, rales, rhonchi or crackles. Normal respiratory effort and patient is not tachypenic. No accessory muscle use.  Unlabored breathing and not wearing supplemental oxygen nasal cannula Cardiovascular: RRR, no murmurs / rubs / gallops. S1 and S2 auscultated.  Slight 1+ lower extremity edema Abdomen: Soft, non-tender, distended secondary body habitus. Bowel sounds positive.  GU: Deferred. Musculoskeletal: No clubbing / cyanosis of digits/nails. No joint deformity upper and lower  extremities.  Skin: No rashes, lesions, ulcers on limited skin evaluation. No induration; Warm and dry.  Neurologic: CN 2-12 grossly intact with no focal deficits. Romberg sign and cerebellar reflexes not assessed.  Psychiatric: Normal judgment and insight. Alert and oriented x 3. Normal mood and appropriate affect.   Data Reviewed: I have personally reviewed following labs and imaging studies  CBC: Recent Labs  Lab 08/19/21 0540 08/20/21 0618 08/21/21 0513 08/22/21 0623 08/23/21 0500 08/24/21 0515  WBC 9.1 10.2 20.6* 24.7* 19.0* 9.4  NEUTROABS 8.0* 8.5* 19.2*  --  16.7* 6.4  HGB 13.4 13.2 13.0 13.1 13.0 12.6*  HCT 40.3 39.6 40.0 40.5 39.7 38.4*  MCV 92.0 91.2 93.7 94.0 93.2 92.1  PLT 242 213 190 155 155 148*    Basic Metabolic Panel: Recent Labs  Lab 08/20/21 0618 08/21/21 0513 08/22/21 0623 08/23/21 0500 08/24/21 0515  NA 136 135 137 137 136  K 4.3 3.9 3.9 3.7 4.0  CL 101 97* 97* 95* 95*  CO2 31 32 35* 34* 35*  GLUCOSE 113* 88 87 86 86  BUN 20 19 17 19 21   CREATININE 0.88 0.79 0.90 0.82 0.93  CALCIUM 8.8* 8.4* 8.4* 8.6* 8.4*  MG  --   --   --  2.0 1.9  PHOS  --   --   --  4.0 4.0    GFR: Estimated Creatinine Clearance: 114.6 mL/min (by C-G formula based on SCr of 0.93 mg/dL). Liver Function Tests: Recent Labs  Lab 08/20/21 0618 08/21/21 0513 08/22/21 1000 08/23/21 0500 08/24/21 0515  AST 58* 48* 58* 47* 47*  ALT 108* 107* 151* 159* 161*  ALKPHOS 63 62 72 82 88  BILITOT 0.3 0.4 0.7 0.7 0.9  PROT 6.9 6.5 6.3* 6.2* 6.2*  ALBUMIN 3.2* 3.2* 3.3* 2.9* 3.1*    No results for input(s): LIPASE, AMYLASE in the last 168 hours. No results for input(s): AMMONIA in the last 168 hours. Coagulation Profile: No results for input(s): INR, PROTIME in the last 168 hours. Cardiac Enzymes: No results for input(s): CKTOTAL, CKMB, CKMBINDEX, TROPONINI in the last 168 hours. BNP (last 3 results) No results for input(s): PROBNP in the last 8760 hours. HbA1C: No results  for input(s): HGBA1C in the last 72 hours. CBG: No results for input(s): GLUCAP in the last 168 hours. Lipid Profile: No results for input(s): CHOL, HDL, LDLCALC, TRIG, CHOLHDL, LDLDIRECT in the last 72 hours. Thyroid Function Tests: No results for input(s): TSH, T4TOTAL, FREET4, T3FREE, THYROIDAB in the last 72 hours. Anemia Panel: No results for input(s): VITAMINB12, FOLATE, FERRITIN, TIBC, IRON, RETICCTPCT in the last 72 hours. Sepsis Labs: No results for input(s): PROCALCITON, LATICACIDVEN in the last 168 hours.  Recent Results (from the past 240 hour(s))  Resp Panel by RT-PCR (Flu A&B, Covid) Nasopharyngeal Swab     Status: None   Collection Time: 08/15/21  8:40 PM   Specimen: Nasopharyngeal Swab; Nasopharyngeal(NP) swabs in vial transport medium  Result Value Ref Range Status   SARS Coronavirus 2 by RT PCR NEGATIVE NEGATIVE Final    Comment: (NOTE) SARS-CoV-2 target nucleic acids are NOT DETECTED.  The SARS-CoV-2 RNA is generally detectable in upper respiratory specimens during the acute phase of infection. The lowest concentration of SARS-CoV-2 viral copies this assay can detect is 138 copies/mL. A negative result does not preclude SARS-Cov-2 infection and should not be used as the sole basis for treatment or other patient management decisions. A negative result may occur with  improper specimen collection/handling, submission of specimen other than nasopharyngeal swab, presence of viral mutation(s) within the areas targeted by this assay, and inadequate number of viral copies(<138 copies/mL). A negative result must be combined with clinical observations, patient history, and epidemiological information. The expected result is Negative.  Fact Sheet for Patients:  EntrepreneurPulse.com.au  Fact Sheet for Healthcare Providers:  IncredibleEmployment.be  This test is no t yet approved or cleared by the [place] FDA and  has been  authorized for detection and/or diagnosis of SARS-CoV-2 by FDA under an Emergency Use Authorization (EUA). This EUA will remain  in effect (meaning this test can be used) for the duration of the COVID-19 declaration under Section 564(b)(1) of the Act, 21 U.S.C.section 360bbb-3(b)(1), unless the authorization is terminated  or revoked sooner.       Influenza A by PCR NEGATIVE NEGATIVE Final   Influenza B by PCR NEGATIVE NEGATIVE Final    Comment: (NOTE)  The Xpert Xpress SARS-CoV-2/FLU/RSV plus assay is intended as an aid in the diagnosis of influenza from Nasopharyngeal swab specimens and should not be used as a sole basis for treatment. Nasal washings and aspirates are unacceptable for Xpert Xpress SARS-CoV-2/FLU/RSV testing.  Fact Sheet for Patients: EntrepreneurPulse.com.au  Fact Sheet for Healthcare Providers: IncredibleEmployment.be  This test is not yet approved or cleared by the [place] FDA and has been authorized for detection and/or diagnosis of SARS-CoV-2 by FDA under an Emergency Use Authorization (EUA). This EUA will remain in effect (meaning this test can be used) for the duration of the COVID-19 declaration under Section 564(b)(1) of the Act, 21 U.S.C. section 360bbb-3(b)(1), unless the authorization is terminated or revoked.  Performed at [facility], [place] [address], [place],  [address]      RN Pressure Injury Documentation:     Estimated body mass index is 46.65 kg/m as calculated from the following:   Height as of this encounter: 5\' 9"  (1.753 m).   Weight as of this encounter: 143.3 kg.  Malnutrition Type:  Nutrition Problem: Increased nutrient needs Etiology: cancer and cancer related treatments  Malnutrition Characteristics:  Signs/Symptoms: estimated needs  Nutrition Interventions:  Interventions: Ensure Enlive (each supplement provides 350kcal and 20 grams of protein)    Radiology Studies: No results found.  Scheduled Meds:  allopurinol  300 mg Oral Daily   amLODipine  10 mg Oral Daily   Chlorhexidine Gluconate Cloth  6 each Topical Daily   enoxaparin (LOVENOX) injection  120 mg Subcutaneous Q12H   feeding supplement  237 mL Oral BID BM   sertraline  50 mg Oral Daily   simvastatin  40 mg Oral Once per day on Mon Wed Fri   sodium chloride flush  10-40 mL Intracatheter Q12H   Continuous Infusions:   LOS: 8 days   Kerney Elbe, DO [facility] PAGER is on [place]  If 7PM-7AM, please contact night-coverage [email or web address]

## 2021-08-24 NOTE — Consult Note (Signed)
NAME:  Joe [name], MRN:  [id number], DOB:  [birth year], LOS: 8 ADMISSION DATE:  08/15/2021, CONSULTATION DATE:  08/24/21 REFERRING MD:  Alfredia Ferguson, CHIEF COMPLAINT:  syncope   History of Present Illness:  65yM with history as noted below who presented to [facility] ED 10/26 due to recurrent syncope, encouraged to go to ED by oncologist. 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 2 weeks PTA found to have PE and started on eliquis. Reported to primary team that on day of admission had sharp left neck pain and passed out from pain.   Here he underwent first cycle of R-CHOP, has been monitored closely for TLS continuing allopurinol. Second cycle planned for 11/8. Getting everything together for discharge however on evaluation this morning without O2 he was found by primary team to have O2 saturation in 50s. He equivocated when asked if he has home O2 already set up. We are asked to see him to facilitate evaluation of OSA.   He says he does have significant daytime sleepiness, loud snoring, PND. No particularly concerning peri-sleep leg movements or sleep behavioral concerns.   Pertinent  Medical History  Stage IV High Grade Follicular lymphoma ?Chronic hypoxemic respiratory failure Presumed OSA Obesity PE  Covid-19 infection in 2020, 2022 HTN Remote smoking  Significant Hospital Events: Including procedures, antibiotic start and stop dates in addition to other pertinent events   R-CHOP started 10/29  Interim History / Subjective:  N/a  Objective   Blood pressure 139/88, pulse 86, temperature 97.9 F (36.6 C), temperature source Oral, resp. rate 18, height 5\' 9"  (1.753 m), weight (!) 143.3 kg, SpO2 94 %.        Intake/Output Summary (Last 24 hours) at 08/24/2021 1024 Last data filed at 08/23/2021 1300 Gross per 24 hour  Intake 358 ml  Output --  Net 358 ml    Filed Weights   08/21/21 0613 08/23/21 0558 08/24/21 0500  Weight: (!) 144 kg (!) 144 kg (!) 143.3 kg    Examination: General appearance: 65 y.o., male, NAD, conversant, obese   Eyes: anicteric sclerae, moist conjunctivae; no lid-lag; PERRL, tracking appropriately HENT: NCAT; mallapati 4 with large beard Neck: large left neck mass Lungs: faint rhonchi, no crackles, no wheeze, with normal respiratory effort CV: RRR, no MRGs  Abdomen: Soft, non-tender; non-distended, BS present  Extremities: 1+ nonpitting peripheral edema, radial and DP pulses present bilaterally  Skin: Normal temperature, turgor and texture; no rash Psych: Appropriate affect Neuro: Alert and oriented to person and place, no focal deficit    TTE 10/21 with normal RV, EF, no diastolic dysfunction CTA Chest 10/26 reviewed by me and remarkable for tracheal deviation with left neck mass. Hard to say if there is truly any superimposed burden of ggo as this is an expiratory scan.  ABG 08/22/21 7.37/61/97 on ?FiO2  Resolved Hospital Problem list     Assessment & Plan:  # Acute vs acute on chronic hypoxic respiratory failure # Suspected OSA and possible OHS # Chronic hypercapnic respiratory failure Neck compression from massive LAD not doing him any favors from a sleep disordered breathing standpoint. He is high probability anyway for OSA. He does have compensated hypercapnia however it's relatively mild and to some extent may be attributable to alveolar hypoventilation from untreated OSA rather than truly being due to OHS. - for expedited evaluation of OSA I think home sleep study will be fastest route  to PAP which I will order. I will also request follow up with one of our sleep providers - PAP requirements may evolve with treatment of his lymphoma - will need PFTs as outpatient which I will order - he does have remote smoking history - he foraged for his home oxygen concentrator. He will need to be walked for O2  testing here and then set up with a prescription for oxygen and needs a DME supplier. If he doesn't have O2 need with exertion then overnight oximetry can be done here in hospital  # Recurrent syncope: possibly due to left neck mass/encasement of left carotid. He has had no syncope since being here in hospital.   # High grade Stage IV follicular lymphoma - mgmt per onc   # PE - lovenox here  Will sign off but glad to be reinvolved as condition changes  Best Practice (right click and "Reselect all SmartList Selections" daily)   Per onc  Labs   CBC: Recent Labs  Lab 08/19/21 0540 08/20/21 0618 08/21/21 0513 08/22/21 0623 08/23/21 0500 08/24/21 0515  WBC 9.1 10.2 20.6* 24.7* 19.0* 9.4  NEUTROABS 8.0* 8.5* 19.2*  --  16.7* 6.4  HGB 13.4 13.2 13.0 13.1 13.0 12.6*  HCT 40.3 39.6 40.0 40.5 39.7 38.4*  MCV 92.0 91.2 93.7 94.0 93.2 92.1  PLT 242 213 190 155 155 148*    Basic Metabolic Panel: Recent Labs  Lab 08/20/21 0618 08/21/21 0513 08/22/21 0623 08/23/21 0500 08/24/21 0515  NA 136 135 137 137 136  K 4.3 3.9 3.9 3.7 4.0  CL 101 97* 97* 95* 95*  CO2 31 32 35* 34* 35*  GLUCOSE 113* 88 87 86 86  BUN 20 19 17 19 21   CREATININE 0.88 0.79 0.90 0.82 0.93  CALCIUM 8.8* 8.4* 8.4* 8.6* 8.4*  MG  --   --   --  2.0 1.9  PHOS  --   --   --  4.0 4.0   GFR: Estimated Creatinine Clearance: 114.6 mL/min (by C-G formula based on SCr of 0.93 mg/dL). Recent Labs  Lab 08/21/21 0513 08/22/21 0623 08/23/21 0500 08/24/21 0515  WBC 20.6* 24.7* 19.0* 9.4    Liver Function Tests: Recent Labs  Lab 08/20/21 0618 08/21/21 0513 08/22/21 1000 08/23/21 0500 08/24/21 0515  AST 58* 48* 58* 47* 47*  ALT 108* 107* 151* 159* 161*  ALKPHOS 63 62 72 82 88  BILITOT 0.3 0.4 0.7 0.7 0.9  PROT 6.9 6.5 6.3* 6.2* 6.2*  ALBUMIN 3.2* 3.2* 3.3* 2.9* 3.1*   No results for input(s): LIPASE, AMYLASE in the last 168 hours. No results for input(s): AMMONIA in the last 168 hours.  ABG     Component Value Date/Time   PHART 7.371 08/22/2021 0731   PCO2ART 61.4 (H) 08/22/2021 0731   PO2ART 97.6 08/22/2021 0731   HCO3 34.7 (H) 08/22/2021 0731   O2SAT 97.5 08/22/2021 0731     Coagulation Profile: No results for input(s): INR, PROTIME in the last 168 hours.  Cardiac Enzymes: No results for input(s): CKTOTAL, CKMB, CKMBINDEX, TROPONINI in the last 168 hours.  HbA1C: No results found for: HGBA1C  CBG: No results for input(s): GLUCAP in the last 168 hours.  Review of Systems:   12 point review of systems is negative except as in HPI  Past Medical History:  He,  has a past medical history of Anxiety, Arthritis, Cancer ([place]), COVID, Depression, and Hypertension.   Surgical History:   Past Surgical History:  Procedure  Laterality Date   carpel tunnel     MASS BIOPSY Left 07/14/2021   Procedure: NECK MASS BIOPSY;  Surgeon: Melida Quitter, MD;  Location: [facility];  Service: ENT;  Laterality: Left;     Social History:   reports that he has quit smoking. His smoking use included cigarettes. He has quit using smokeless tobacco.  His smokeless tobacco use included chew. He reports current alcohol use. He reports that he does not use drugs.   Family History:  His family history includes Healthy in his father and mother.   Allergies No Known Allergies   Home Medications  Prior to Admission medications   Medication Sig Start Date End Date Taking? Authorizing Provider  allopurinol (ZYLOPRIM) 300 MG tablet Take 300 mg by mouth daily.   Yes [provider]  amLODipine (NORVASC) 10 MG tablet Take 10 mg by mouth daily. 04/26/20  Yes [provider]  APIXABAN Arne Cleveland) VTE STARTER PACK (10MG  AND 5MG ) Take as directed on package: start with two-5mg  tablets twice daily for 7 days. On day 8, switch to one-5mg  tablet twice daily. 08/02/21  Yes Alma Friendly, MD  ergocalciferol (VITAMIN D2) 1.25 MG (50000 UT) capsule Take 1 capsule (50,000 Units total) by mouth  once a week. Patient taking differently: Take 50,000 Units by mouth once a week. on Wednesday 08/09/21  Yes Brunetta Genera, MD  Multiple Vitamin (MULTIVITAMIN WITH MINERALS) TABS tablet Take 1 tablet by mouth daily. 08/03/21 09/02/21 Yes Alma Friendly, MD  sertraline (ZOLOFT) 50 MG tablet Take 50 mg by mouth daily.   Yes [provider]  ondansetron (ZOFRAN) 8 MG tablet Take 1 tablet (8 mg total) by mouth 2 (two) times daily as needed for refractory nausea / vomiting. Start on day 3 after cyclophosphamide chemotherapy. 08/14/21   Brunetta Genera, MD  predniSONE (DELTASONE) 20 MG tablet Take 3 tablets (60 mg total) by mouth daily. Take with food on days 2-6 of chemotherapy. 08/14/21   Brunetta Genera, MD  prochlorperazine (COMPAZINE) 10 MG tablet Take 1 tablet (10 mg total) by mouth every 6 (six) hours as needed (Nausea or vomiting). 08/14/21   Brunetta Genera, MD  simvastatin (ZOCOR) 40 MG tablet Take 40 mg by mouth 3 (three) times a week. Patient not taking: Reported on 08/16/2021 06/26/21   [provider]          [place]

## 2021-08-24 NOTE — Telephone Encounter (Signed)
Can we set this patient up with home sleep study as soon as can be scheduled? It sounds like he'll be discharged in the next 1-2 days. He request sleep provider at [facility] if possible. I've also ordered PFTs to be done on same day as appointment at [facility].  Thanks!!

## 2021-08-25 DIAGNOSIS — D701 Agranulocytosis secondary to cancer chemotherapy: Secondary | ICD-10-CM

## 2021-08-25 DIAGNOSIS — T451X5A Adverse effect of antineoplastic and immunosuppressive drugs, initial encounter: Secondary | ICD-10-CM

## 2021-08-25 LAB — PHOSPHORUS: Phosphorus: 4.2 mg/dL (ref 2.5–4.6)

## 2021-08-25 LAB — COMPREHENSIVE METABOLIC PANEL
ALT: 154 U/L — ABNORMAL HIGH (ref 0–44)
AST: 37 U/L (ref 15–41)
Albumin: 3 g/dL — ABNORMAL LOW (ref 3.5–5.0)
Alkaline Phosphatase: 77 U/L (ref 38–126)
Anion gap: 8 (ref 5–15)
BUN: 19 mg/dL (ref 8–23)
CO2: 32 mmol/L (ref 22–32)
Calcium: 8.5 mg/dL — ABNORMAL LOW (ref 8.9–10.3)
Chloride: 97 mmol/L — ABNORMAL LOW (ref 98–111)
Creatinine, Ser: 0.8 mg/dL (ref 0.61–1.24)
GFR, Estimated: >60 mL/min (ref >60)
Glucose, Bld: 97 mg/dL (ref 70–99)
Potassium: 3.8 mmol/L (ref 3.5–5.1)
Sodium: 137 mmol/L (ref 135–145)
Total Bilirubin: 0.5 mg/dL (ref 0.3–1.2)
Total Protein: 6.1 g/dL — ABNORMAL LOW (ref 6.5–8.1)

## 2021-08-25 LAB — CBC WITH DIFFERENTIAL/PLATELET
Abs Immature Granulocytes: 0.01 10*3/uL (ref 0.00–0.07)
Basophils Absolute: 0 10*3/uL (ref 0.0–0.1)
Basophils Relative: 1 %
Eosinophils Absolute: 0.3 10*3/uL (ref 0.0–0.5)
Eosinophils Relative: 16 %
HCT: 38.4 % — ABNORMAL LOW (ref 39.0–52.0)
Hemoglobin: 12.5 g/dL — ABNORMAL LOW (ref 13.0–17.0)
Immature Granulocytes: 1 %
Lymphocytes Relative: 48 %
Lymphs Abs: 0.9 10*3/uL (ref 0.7–4.0)
MCH: 30 pg (ref 26.0–34.0)
MCHC: 32.6 g/dL (ref 30.0–36.0)
MCV: 92.1 fL (ref 80.0–100.0)
Monocytes Absolute: 0.1 10*3/uL (ref 0.1–1.0)
Monocytes Relative: 3 %
Neutro Abs: 0.6 10*3/uL — ABNORMAL LOW (ref 1.7–7.7)
Neutrophils Relative %: 31 %
Platelets: 129 10*3/uL — ABNORMAL LOW (ref 150–400)
RBC: 4.17 MIL/uL — ABNORMAL LOW (ref 4.22–5.81)
RDW: 14 % (ref 11.5–15.5)
WBC: 1.8 10*3/uL — ABNORMAL LOW (ref 4.0–10.5)
nRBC: 0 % (ref 0.0–0.2)

## 2021-08-25 LAB — MAGNESIUM: Magnesium: 1.9 mg/dL (ref 1.7–2.4)

## 2021-08-25 MED ORDER — SODIUM BICARBONATE/SODIUM CHLORIDE MOUTHWASH: 1.0000 "application " | OROMUCOSAL | 0 refills | Status: DC | PRN

## 2021-08-25 MED ORDER — SODIUM CHLORIDE 0.9% FLUSH: 10.0000 mL | INTRAVENOUS | 0 refills | Status: AC | PRN

## 2021-08-25 MED ORDER — HEPARIN SOD (PORK) LOCK FLUSH 100 UNIT/ML IV SOLN
250.0000 [IU] | INTRAVENOUS | Status: DC | PRN
  Administered 2021-08-25: 250 [IU]

## 2021-08-25 MED ORDER — ENOXAPARIN SODIUM 120 MG/0.8ML IJ SOSY: 120.0000 mg | PREFILLED_SYRINGE | Freq: Two times a day (BID) | INTRAMUSCULAR | 0 refills | Status: DC

## 2021-08-25 MED ORDER — ENSURE ENLIVE PO LIQD: 237.0000 mL | Freq: Two times a day (BID) | ORAL | 12 refills | Status: AC

## 2021-08-25 MED ORDER — ACETAMINOPHEN 325 MG PO TABS: 650.0000 mg | ORAL_TABLET | Freq: Four times a day (QID) | ORAL | 0 refills | Status: AC | PRN

## 2021-08-25 MED ORDER — HEPARIN SOD (PORK) LOCK FLUSH 100 UNIT/ML IV SOLN
250.0000 [IU] | Freq: Every day | INTRAVENOUS | Status: DC
  Administered 2021-08-25: 250 [IU]
  Filled 2021-08-25: qty 5

## 2021-08-25 MED ORDER — CIPROFLOXACIN HCL 500 MG PO TABS: 500.0000 mg | ORAL_TABLET | Freq: Every day | ORAL | 0 refills | Status: AC

## 2021-08-25 MED ORDER — CIPROFLOXACIN HCL 500 MG PO TABS
500.0000 mg | ORAL_TABLET | Freq: Every day | ORAL | Status: DC
  Administered 2021-08-25: 500 mg via ORAL
  Filled 2021-08-25: qty 1

## 2021-08-25 MED ORDER — TBO-FILGRASTIM 480 MCG/0.8ML ~~LOC~~ SOSY
480.0000 ug | PREFILLED_SYRINGE | Freq: Once | SUBCUTANEOUS | Status: AC
  Administered 2021-08-25: 480 ug via SUBCUTANEOUS
  Filled 2021-08-25: qty 0.8

## 2021-08-25 NOTE — Progress Notes (Signed)
Pt discharged home with family in stable condition. Discharge instructions given. Scripts sent to pharmacy of choice. No immediate questions or concerns at this time. Discharged form unit via wheelchair.

## 2021-08-25 NOTE — Discharge Summary (Signed)
Physician Discharge Summary  Joe [name] TKZ:[id number] DOB: [birth year] DOA: 08/15/2021  PCP: Celene Squibb, MD  Admit date: 08/15/2021 Discharge date: 08/25/2021  Admitted From: Home Disposition: Home  Recommendations for Outpatient Follow-up:  Follow up with PCP in 1-2 weeks Follow-up with medical oncology on 08/28/2019 Follow-up with pulmonary in the outpatient setting for outpatient sleep study as well as PFTs; patient qualified for supplemental oxygen at home given his overnight oximetry test will be discharged on 2 L/min Please obtain CMP/CBC, Mag, Phos in one week Please follow up on the following pending results:  Home Health: No Equipment/Devices: None  Discharge Condition: Stable CODE STATUS: Full code Diet recommendation: Heart healthy diet  Brief/Interim Summary: The patient is a 65 year old morbidly obese Caucasian male with a past medical history significant for but limited to hypertension, obstructive sleep apnea as well as other comorbidities who presented with recurrent episodes of syncope.  He is recently diagnosed with follicular lymphoma and has a PE that was also diagnosed 2 weeks ago.  He reported losing consciousness after experiencing left-sided neck pain.  On initial examination his blood pressure was normal at 120/73.  He was found to have a large soft tissue mass in the left side of the neck that radiated from the angle of the mandible to the posterior cervical spinous process.  His syncope was thought to be due to his left large lymphoma with mass-effect on vascular structures as well as airway provoking thoracic outlet obstruction and this required urgent treatment.  Hematology oncology was consulted and he started on urgent chemotherapy with CHOP on 07/29/2021.  He received a dose of rituximab on 08/20/2021.  Currently PT OT tto further evaluate and treat and patient is doing better and thinks his neck mass is not as swollen and requesting diet advancement so we  will consult SLP for further evaluation.    SLP placed the patient on a dysphagia 3 diet.  Patient has 1 more day of Granix and medical oncology recommending PICC line at discharge.  They are recommending a TOC consult for PICC line supplies and PICC flushes by home health RN as well as home health PT.  They are arranging follow-up with for 08/27/2021.  Patient can likely be discharged home tomorrow and he will start his second cycle of chemotherapy on 09/07/2021.  TOC is having a hard time obtaining home health services for the patient and medical oncology recommends that they try to arrange for home health nurse to flush the PICC 3 times a week and then change his dressing once a week but this cannot be arranged at they are recommending the patient come to the oncology office to have this done.    Given his nocturnal desaturations and extremely low O2 saturation pulmonary was consulted and they recommended obtaining an ambulatory home O2 screen and doing an overnight pulse oximetry study.  They are arranging for the patient get a CPAP machine as well and have the patient follow-up in outpatient setting within 1 to 2 weeks.  Patient did qualify for supplemental oxygen during his overnight oximetry study.  He will be discharged with this.  Prior to discharge he was found to be neutropenic and the case was discussed with Dr. Marin Olp who recommended giving the patient an additional dose of Granix today and start the patient on p.o. ciprofloxacin 500 mg p.o. daily for 5 days and he will see his primary medical oncologist on Monday.  Patient was deemed medically stable to be discharged and  will need to follow-up with PCP, medical oncology as well as pulmonary in outpatient setting.  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 Examination is positive for large FDG avid mass within the left neck extending from the level 2 nodal chain into the left side of the superior mediastinum compatible with metabolically active tumor. 2. There are 2 foci of increased uptake within the neck and body of pancreas lymphomatous involvement of the pancreas. 3. Subcentimeter FDG avid right inguinal lymph node also concerning for lymphoma. 4. Several foci of increased uptake within the axial skeleton compatible with malignancy" -CT of the neck showed concern with compression of the left internal jugular vein with mild mass-effect on the airway and the neck and left carotid which is inseparable from the mass -Medical oncology has been consulted for further evaluation recommendations; he is initiated on CHOP Chemotherapy 08/18/2021 and tolerated well -Patient also received rituximab on 08/20/21 and getting 60 mg of prednisone p.o. daily from Day 2-Day 6 s/p Chemotherapy -Per medical oncology patient cannot do Udenyca as Inpatient so is also continuing to get TBL-filgrastim 480 mcg subcu daily x5 days  -We will continue Allopurinol 300 mg po Daily due to High Risk for tumor lysis syndrome -We will obtain SLP evaluation for further diet advancement as he is on a dysphagia 1 diet and continue with sodium bicarbonate/sodium chloride mouthwashes; diet has now been advanced to dysphagia 3 diet -Repeat CMP and CBC within 1 week -PT and OT to further evaluate and treat and they are recommending no  follow-up -Patient has a follow-up appointment with Dr. Irene Limbo on 08/27/2021 and will leave PICC line in place and have the patient follow-up with the oncology clinic for flushes 3 times a week and dressing changes   Leukocytosis now leukopenia and neutropenia -Patient's WBC went from 8.1 -> 9.1 -> 10.2 -> 20.6 -> 24.7 and is now 19.0 yesterday and today it is 9.4 normalized but then dropped today to 1.8 with an [place] of 600 -WBC was increased in the setting of steroid demargination with prednisone as well as TPO-filgrastim.  Now he became neutropenic with no source of infection he is leukopenic.  Case was discussed with Dr. Marin Olp prior to discharge who recommended giving the patient another dose of Granix today and placing the patient on p.o. ciprofloxacin 500 mg daily for 5 days and have the patient follow-up with his oncologist on Monday -Continue to monitor for signs and symptoms of infection -Repeat CBC within 1 week   PE in the setting of malignancy -He is anticoagulated with enoxaparin 120 mg subcu every 12 and will continue this at discharge and stop his oral apixaban   Essential HTN -C/w Amlodipine 10 mg po Daily -Continue to Monitor blood pressures per protocol -Last blood pressure reading was 153/93   HLD/Dyslipidemia -C/w Simvastatin 40 mg po 3x weekly (MWF)   Depression and Anxiety -  Chronic and stable -No evidence of any SI or HI -Continue with Sertraline 50 mg p.o. daily   Morbid Obesity/Class III Obesity -Complicates overall prognosis and care -Estimated body mass index is 46.65 kg/m as calculated from the following:   Height as of this encounter: '5\' 9"'  (1.753 m).   Weight as of this encounter: 143.3 kg. -Weight Loss and Dietary Counseling given  -He will need an outpatient sleep study given concern for sleep apnea  -He is not agreeable to CPAP and will continue supplemental oxygen via nasal cannula as needed -Dr. Ouida Sills had recommended positioning in bed pillows to  limit airway obstruction while sleeping   Suspected OSA due to obesity hypoventilation syndrome and likely chronic respiratory hypercapnic respiratory failure Acute respiratory failure with hypoxia in the setting of nocturnal dyspnea and desaturations -AM ABG done and showed  Labs (Brief)          Component Value Date/Time    PHART 7.371 08/22/2021 0731    PCO2ART 61.4 (H) 08/22/2021 0731    PO2ART 97.6 08/22/2021 0731    HCO3 34.7 (H) 08/22/2021 0731    O2SAT 97.5 08/22/2021 0731    -We will need an outpatient sleep study and pulmonary has been consulted for expedited evaluation of his OSA and they think that the home sleep study will be discussed with sharp to PAP which has been ordered -Patient will need PFTs this will be done in outpatient setting -Patient desaturated significantly to the 50s yesterday overnight as his nocturnal oxygen came off -Ambulatory home O2 screen done and did not desaturate while ambulating but will order an overnight oximetry study to evaluate how much the patient needs for discharge home; he will need 2 L submental oxygen via nasal cannula at discharge  Discharge Instructions  Discharge Instructions     Call MD for:  difficulty breathing, headache or visual disturbances   Complete by: As directed    Call MD for:  extreme fatigue   Complete by: As directed    Call MD for:  hives   Complete by: As directed    Call MD for:  persistant dizziness or light-headedness   Complete by: As directed    Call MD for:  persistant nausea and vomiting   Complete by: As directed    Call MD for:  redness, tenderness, or signs of infection (pain, swelling, redness, odor or green/yellow discharge around incision site)   Complete by: As directed    Call MD for:  severe uncontrolled pain   Complete by: As directed    Call MD for:  temperature >100.4   Complete by: As directed    Diet - low sodium heart healthy   Complete by: As directed    Discharge instructions    Complete by: As directed    You were cared for by a hospitalist during your hospital stay. If you have any questions about your discharge medications or the care you received while you were in the hospital after you are discharged, you can call the unit and ask to speak with the hospitalist on call if the hospitalist that took care of you is not available. Once you are discharged, your primary care physician will handle any further medical issues. Please note that NO REFILLS for any discharge medications will be authorized once you are discharged, as it is imperative that you return to your primary care physician (or establish a relationship with a primary care physician if you do not have one) for your  aftercare needs so that they can reassess your need for medications and monitor your lab values.  Follow up with PCP, Medical Oncology, and Pulmonary in the outpatient setting. Take all medications as prescribed. If symptoms change or worsen please return to the ED for evaluation   Increase activity slowly   Complete by: As directed    TREATMENT CONDITIONS   Complete by: As directed    Patient should have CBC & CMP within 7 days prior to chemotherapy administration. NOTIFY MD IF: ANC < 1500, Hemoglobin < 8, PLT < 100,000,  Total Bili > 1.5, Creatinine > 1.5, ALT & AST > 80 or if patient has unstable vital signs: Temperature > 38.5, SBP > 180 or < 90, RR > 30 or HR > 100.      Allergies as of 08/25/2021   No Known Allergies      Medication List     STOP taking these medications    Apixaban Starter Pack (70m and 534m Commonly known as: ELIQUIS STARTER PACK   chlorhexidine 0.12 % solution Commonly known as: Peridex       TAKE these medications    acetaminophen 325 MG tablet Commonly known as: TYLENOL Take 2 tablets (650 mg total) by mouth every 6 (six) hours as needed for mild pain (or Fever >/= 101).   allopurinol 300 MG tablet Commonly known as: ZYLOPRIM Take 300 mg by mouth  daily.   amLODipine 10 MG tablet Commonly known as: NORVASC Take 10 mg by mouth daily.   ciprofloxacin 500 MG tablet Commonly known as: CIPRO Take 1 tablet (500 mg total) by mouth daily with breakfast for 5 days.   enoxaparin 120 MG/0.8ML injection Commonly known as: LOVENOX Inject 0.8 mLs (120 mg total) into the skin every 12 (twelve) hours.   ergocalciferol 1.25 MG (50000 UT) capsule Commonly known as: VITAMIN D2 Take 1 capsule (50,000 Units total) by mouth once a week. What changed: additional instructions   feeding supplement Liqd Take 237 mLs by mouth 2 (two) times daily between meals.   multivitamin with minerals Tabs tablet Take 1 tablet by mouth daily.   ondansetron 8 MG tablet Commonly known as: Zofran Take 1 tablet (8 mg total) by mouth 2 (two) times daily as needed for refractory nausea / vomiting. Start on day 3 after cyclophosphamide chemotherapy.   predniSONE 20 MG tablet Commonly known as: DELTASONE Take 3 tablets (60 mg total) by mouth daily. Take with food on days 2-6 of chemotherapy. What changed: Another medication with the same name was removed. Continue taking this medication, and follow the directions you see here.   prochlorperazine 10 MG tablet Commonly known as: COMPAZINE Take 1 tablet (10 mg total) by mouth every 6 (six) hours as needed (Nausea or vomiting).   sertraline 50 MG tablet Commonly known as: ZOLOFT Take 50 mg by mouth daily.   simvastatin 40 MG tablet Commonly known as: ZOCOR Take 40 mg by mouth 3 (three) times a week.   sodium bicarbonate/sodium chloride Soln 1 application by Mouth Rinse route as needed for dry mouth.   sodium chloride flush 0.9 % Soln Commonly known as: NS 10-40 mLs by Intracatheter route as needed (flush).               Durable Medical Equipment  (From admission, onward)           Start     Ordered   08/25/21 1257  For home use only DME oxygen  Once  Question Answer Comment  Length of  Need 6 Months   Mode or (Route) Nasal cannula   Liters per Minute 2   Frequency Only at night (stationary unit needed)   Oxygen conserving device No   Oxygen delivery system Gas      08/25/21 1256            Follow-up Information     Celene Squibb, MD. Call.   Specialty: Internal Medicine Why: Follow up within 1-2 weeks Contact information: [facility] [address] [phone]         Brunetta Genera, MD. Go to.   Specialties: Hematology, Oncology Why: Appointment scheduled for 08/27/21 Contact information: [place] [address] [phone]         Chesley Mires, MD Follow up on 10/24/2021.   Specialty: Pulmonary Disease Why: Appointment scheduled for 9:30 AM Contact information: [place] [address] [place] [address] [phone]         [facility] Follow up.   Why: The HST has been scheduled with the [facility] for 09/07/21 at 7:30 PM for pt to pick up the device at [facility].  They check in at the ED for that appointment. Contact information: [address] [place] [phone]               No Known Allergies  Consultations: Pulmonary Medical oncology   Procedures/Studies: CT Angio Head W or Wo Contrast  Result Date: 08/15/2021 CLINICAL DATA:  Syncope and hypoxia, large neck mass due to non-Hodgkin's lymphoma EXAM: CT ANGIOGRAPHY HEAD AND NECK TECHNIQUE: Multidetector CT imaging of the head and neck was performed using the standard protocol during bolus administration of intravenous contrast. Multiplanar CT image reconstructions and MIPs were obtained to evaluate the vascular anatomy. Carotid stenosis measurements (when applicable) are obtained utilizing NASCET criteria, using the distal internal carotid diameter as the denominator. CONTRAST:  173m OMNIPAQUE IOHEXOL 350 MG/ML SOLN COMPARISON:  CT neck 07/09/2021  FINDINGS: CT HEAD FINDINGS Brain: There is no acute intracranial hemorrhage, extra-axial fluid collection, or acute infarct. The ventricles are normal in size. There is no mass lesion. There is no midline shift. Vascular: No hyperdense vessel or unexpected calcification. The vasculature is assessed info below Skull: Normal. Negative for fracture or focal lesion. Sinuses: There is moderate mucosal thickening with layering fluid in the right maxillary sinus. There is surrounding hyperostosis consistent with chronic maxillary sinusitis. Orbits: The globes and orbits are unremarkable. Review of the MIP images confirms the above findings CTA NECK FINDINGS Aortic arch: The aortic arch is unremarkable. Right carotid system: The right common, internal, and external carotid arteries are patent with no significant stenosis, dissection, or aneurysm. Left carotid system: The proximal left common carotid artery is unremarkable. There is complete encasement of the common carotid artery from the level of the clavicular head through the bifurcation with complete encasement of the internal and external carotid arteries. The internal carotid artery is encased from the bifurcation through the petrous segment. The distal branches of the external carotid artery are not encased. There is no significant narrowing of the left carotid system. There is no evidence of dissection or aneurysm. There is minimal calcified atherosclerotic plaque in the carotid bulb. Vertebral arteries: The vertebral arteries are patent, with no significant stenosis, dissection, or aneurysm. Skeleton: There is multilevel degenerative change of the cervical spine. There is no acute osseous abnormality or  aggressive osseous lesion. Other neck: There is a large infiltrative soft tissue mass throughout the soft tissues of the left neck. The mass measures up to approximately 8.9 cm AP by 9.3 cm TV by 13.8 cm cc, increased in size since 07/09/2021 when it measured  approximately 6.6 cm TV by 6.0 cm AP by 10.0 cm cc, measured again using similar technique. Inferiorly, the mass extends into the upper mediastinum just posterior to the clavicle. The mass is inseparable from the sternocleidomastoid. As above, there is complete encasement of the carotid vasculature without narrowing of the arteries. The airway is shifted to the right and moderately narrowed at the level of the oropharynx. Upper chest: The lungs are assessed on the separately dictated CT chest. Review of the MIP images confirms the above findings CTA HEAD FINDINGS Anterior circulation: The bilateral intracranial ICAs are patent. The bilateral MCAs are patent The bilateral ACAs are patent. There is no aneurysm. Posterior circulation: The bilateral V4 segments are patent. The basilar artery is patent. The bilateral PCAs are patent. The posterior communicating arteries are not definitely identified. Venous sinuses: There is a hypodense filling defect in the right sigmoid sinus. Anatomic variants: As above. Review of the MIP images confirms the above findings IMPRESSION: Since 07/09/2021: 1. Significant interval increase in size of the large mass in the left neck which extends from the skull base superiorly into the upper mediastinum inferiorly. The mass completely encases most of the left common carotid artery, the entire left internal carotid artery from the bifurcation to the skull base, and much of the left external carotid artery and branches. Despite the encasement, there is no significant narrowing of the left carotid system. The mass results in significant mass effect on surrounding structures and moderate narrowing of the oropharyngeal airway. 2. Otherwise, unremarkable vasculature of the head and neck. 3. Hypodense filling defect in the right sigmoid sinus concerning for nonocclusive thrombus. 4. Chronic right maxillary sinusitis. Electronically Signed   By: Valetta Mole M.D.   On: 08/15/2021 19:24   DG Chest  2 View  Result Date: 08/15/2021 CLINICAL DATA:  Hypoxia.  Lymphoma. EXAM: CHEST - 2 VIEW COMPARISON:  07/31/2021 FINDINGS: Right arm PICC tip in the SVC 4 cm above the right atrium. Heart size is normal. The lungs are clear. No effusions. Left neck mass demonstrated with displacement of the trachea towards the right. IMPRESSION: No active cardiopulmonary disease. PICC tip in the SVC 4 cm above the right atrium. Known left neck mass. Electronically Signed   By: Nelson Chimes M.D.   On: 08/15/2021 14:43   DG Chest 2 View  Result Date: 07/28/2021 CLINICAL DATA:  Seizure-like activity. EXAM: CHEST - 2 VIEW COMPARISON:  June 08, 2019 FINDINGS: The heart size and mediastinal contours are within normal limits. Both lungs are clear. The visualized skeletal structures are unremarkable. IMPRESSION: No active cardiopulmonary disease. Electronically Signed   By: Fidela Salisbury M.D.   On: 07/28/2021 14:51   CT Head Wo Contrast  Result Date: 07/28/2021 CLINICAL DATA:  Seizure activity, frontal scalp abrasion EXAM: CT HEAD WITHOUT CONTRAST TECHNIQUE: Contiguous axial images were obtained from the base of the skull through the vertex without intravenous contrast. COMPARISON:  None. FINDINGS: Brain: No acute infarct or hemorrhage. Lateral ventricles and midline structures are unremarkable. No acute extra-axial fluid collections. No mass effect. Vascular: No hyperdense vessel or unexpected calcification. Skull: Normal. Negative for fracture or focal lesion. Sinuses/Orbits: Mild mucosal thickening within the maxillary sinuses. Remaining paranasal sinuses are  clear. Other: None. IMPRESSION: 1. No acute intracranial process. Electronically Signed   By: Randa Ngo M.D.   On: 07/28/2021 18:39   CT Angio Neck W and/or Wo Contrast  Result Date: 08/15/2021 CLINICAL DATA:  Syncope and hypoxia, large neck mass due to non-Hodgkin's lymphoma EXAM: CT ANGIOGRAPHY HEAD AND NECK TECHNIQUE: Multidetector CT imaging of the  head and neck was performed using the standard protocol during bolus administration of intravenous contrast. Multiplanar CT image reconstructions and MIPs were obtained to evaluate the vascular anatomy. Carotid stenosis measurements (when applicable) are obtained utilizing NASCET criteria, using the distal internal carotid diameter as the denominator. CONTRAST:  133m OMNIPAQUE IOHEXOL 350 MG/ML SOLN COMPARISON:  CT neck 07/09/2021 FINDINGS: CT HEAD FINDINGS Brain: There is no acute intracranial hemorrhage, extra-axial fluid collection, or acute infarct. The ventricles are normal in size. There is no mass lesion. There is no midline shift. Vascular: No hyperdense vessel or unexpected calcification. The vasculature is assessed info below Skull: Normal. Negative for fracture or focal lesion. Sinuses: There is moderate mucosal thickening with layering fluid in the right maxillary sinus. There is surrounding hyperostosis consistent with chronic maxillary sinusitis. Orbits: The globes and orbits are unremarkable. Review of the MIP images confirms the above findings CTA NECK FINDINGS Aortic arch: The aortic arch is unremarkable. Right carotid system: The right common, internal, and external carotid arteries are patent with no significant stenosis, dissection, or aneurysm. Left carotid system: The proximal left common carotid artery is unremarkable. There is complete encasement of the common carotid artery from the level of the clavicular head through the bifurcation with complete encasement of the internal and external carotid arteries. The internal carotid artery is encased from the bifurcation through the petrous segment. The distal branches of the external carotid artery are not encased. There is no significant narrowing of the left carotid system. There is no evidence of dissection or aneurysm. There is minimal calcified atherosclerotic plaque in the carotid bulb. Vertebral arteries: The vertebral arteries are patent,  with no significant stenosis, dissection, or aneurysm. Skeleton: There is multilevel degenerative change of the cervical spine. There is no acute osseous abnormality or aggressive osseous lesion. Other neck: There is a large infiltrative soft tissue mass throughout the soft tissues of the left neck. The mass measures up to approximately 8.9 cm AP by 9.3 cm TV by 13.8 cm cc, increased in size since 07/09/2021 when it measured approximately 6.6 cm TV by 6.0 cm AP by 10.0 cm cc, measured again using similar technique. Inferiorly, the mass extends into the upper mediastinum just posterior to the clavicle. The mass is inseparable from the sternocleidomastoid. As above, there is complete encasement of the carotid vasculature without narrowing of the arteries. The airway is shifted to the right and moderately narrowed at the level of the oropharynx. Upper chest: The lungs are assessed on the separately dictated CT chest. Review of the MIP images confirms the above findings CTA HEAD FINDINGS Anterior circulation: The bilateral intracranial ICAs are patent. The bilateral MCAs are patent The bilateral ACAs are patent. There is no aneurysm. Posterior circulation: The bilateral V4 segments are patent. The basilar artery is patent. The bilateral PCAs are patent. The posterior communicating arteries are not definitely identified. Venous sinuses: There is a hypodense filling defect in the right sigmoid sinus. Anatomic variants: As above. Review of the MIP images confirms the above findings IMPRESSION: Since 07/09/2021: 1. Significant interval increase in size of the large mass in the left neck which extends  from the skull base superiorly into the upper mediastinum inferiorly. The mass completely encases most of the left common carotid artery, the entire left internal carotid artery from the bifurcation to the skull base, and much of the left external carotid artery and branches. Despite the encasement, there is no significant  narrowing of the left carotid system. The mass results in significant mass effect on surrounding structures and moderate narrowing of the oropharyngeal airway. 2. Otherwise, unremarkable vasculature of the head and neck. 3. Hypodense filling defect in the right sigmoid sinus concerning for nonocclusive thrombus. 4. Chronic right maxillary sinusitis. Electronically Signed   By: Valetta Mole M.D.   On: 08/15/2021 19:24   CT Angio Chest PE W and/or Wo Contrast  Result Date: 08/15/2021 CLINICAL DATA:  Concern for pulmonary embolism. EXAM: CT ANGIOGRAPHY CHEST WITH CONTRAST TECHNIQUE: Multidetector CT imaging of the chest was performed using the standard protocol during bolus administration of intravenous contrast. Multiplanar CT image reconstructions and MIPs were obtained to evaluate the vascular anatomy. CONTRAST:  174m OMNIPAQUE IOHEXOL 350 MG/ML SOLN COMPARISON:  Chest CT dated 07/31/2021. FINDINGS: Cardiovascular: There is no cardiomegaly or pericardial effusion. The thoracic aorta is unremarkable. Evaluation of the pulmonary arteries is limited due to respiratory motion artifact and suboptimal visualization of the peripheral branches. No large or central pulmonary artery embolus identified. Right sided PICC now extends superiorly into the right IJ. The PICC appears to bend in the right IJ and turns downward. The tip of the PICC appears to be in the right IJ at the level of the subclavian vein (33/6 and coronal 91/7). Evaluation however is limited due to presence of intravenous contrast. Mediastinum/Nodes: No hilar or mediastinal adenopathy. The esophagus is grossly unremarkable. Partially visualized mass in the left side of the neck measures 9.1 x 8.4 cm and increase in size since the study of 07/09/2021 (previously measuring approximately 7.1 x 6.0 cm). There is associated mass effect and deviation of the trachea to the right. No mediastinal fluid collection. Lungs/Pleura: No focal consolidation, pleural  effusion, or pneumothorax. The central airways are patent. Upper Abdomen: No acute abnormality. Musculoskeletal: Degenerative changes of the spine. No acute osseous pathology. Review of the MIP images confirms the above findings. IMPRESSION: 1. No CT evidence of central pulmonary artery embolus. 2. Right sided PICC now extends superiorly into the right IJ. The tip of the PICC appears to be in the right IJ at the level of the subclavian vein. Evaluation however is limited due to presence of intravenous contrast. 3. Partially visualized mass in the left side of the neck has increased in size since the study of 07/09/2021 and is concerning for malignancy. These results were called by telephone at the time of interpretation on 08/15/2021 at 7:39 pm to Dr. HGilford Raid who verbally acknowledged these results. Electronically Signed   By: AAnner CreteM.D.   On: 08/15/2021 19:49   CT Angio Chest PE W and/or Wo Contrast  Addendum Date: 07/31/2021   ADDENDUM REPORT: 07/31/2021 19:50 ADDENDUM: These results were called by telephone at the time of interpretation on 07/31/2021 at 7:48 pm to provider JOSEPH ZAMMIT , who verbally acknowledged these results. Electronically Signed   By: MIven FinnM.D.   On: 07/31/2021 19:50   Result Date: 07/31/2021 CLINICAL DATA:  PE suspected, low/intermediate prob, neg D-dimer EXAM: CT ANGIOGRAPHY CHEST WITH CONTRAST TECHNIQUE: Multidetector CT imaging of the chest was performed using the standard protocol during bolus administration of intravenous contrast. Multiplanar CT image reconstructions and  MIPs were obtained to evaluate the vascular anatomy. CONTRAST:  133m OMNIPAQUE IOHEXOL 350 MG/ML SOLN COMPARISON:  None. FINDINGS: Cardiovascular: Satisfactory opacification of the pulmonary arteries to the segmental level. Segmental and subsegmental right lower lobe pulmonary embolus. Segmental and subsegmental right middle lobe pulmonary embolus. Limited evaluation of left lower lobe  with question subsegmental pulmonary emboli. Normal heart size. No pericardial effusion. Mediastinum/Nodes: No enlarged mediastinal, hilar, or axillary lymph nodes. Thyroid gland, trachea, and esophagus demonstrate no significant findings. Lungs/Pleura: Limited evaluation due to motion artifact. Expiratory phase of respiration. Mosaic attenuation of the lungs. No focal consolidation. No pulmonary nodule. No pulmonary mass. No pleural effusion. No pneumothorax. Upper Abdomen: Possible mild hepatic steatosis. Musculoskeletal: No chest wall abnormality. No suspicious lytic or blastic osseous lesions. No acute displaced fracture. Multilevel degenerative changes of the spine. Review of the MIP images confirms the above findings. IMPRESSION: Segmental and subsegmental right lower and middle lobe pulmonary emboli. Question left lower lobe subsegmental pulmonary emboli. No findings of right heart strain or pulmonary infarction. Electronically Signed: By: MIven FinnM.D. 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 EXAM: NUCLEAR MEDICINE PET SKULL BASE TO THIGH TECHNIQUE: 16.9 mCi F-18 FDG was injected intravenously. Full-ring PET imaging was performed from the skull base to thigh after the radiotracer. CT data was obtained and used for attenuation correction and anatomic localization. Fasting blood glucose: 121 mg/dl COMPARISON:  None FINDINGS: Mediastinal blood pool activity: SUV max 3.78 Liver activity: SUV max 4.33 NECK: There is a large nodal mass within the left cervical chain extending from the level 2 region through level 4 and 5. This measures the 9.6 x 8.5 by 13.9 cm and has an SUV max 26.47, image 43/4. The tumor extends into the superior mediastinum along with the left paratracheal region, image 56/4. There is also tumor extension into the prevertebral soft tissue space at the  level of the oropharynx with involvement of the right tonsillar fossa. Tumor also extends into the prevertebral soft tissues at the level of the hypopharynx with mass effect upon the hypopharynx with deviation towards the right. Incidental CT findings: none CHEST: Mild FDG uptake associated with 9 mm subcarinal lymph node within SUV max of 3.68, image 83/4. No FDG avid pulmonary nodule. Incidental CT findings: Mild aortic atherosclerosis and coronary artery calcifications. No pleural effusion. ABDOMEN/PELVIS: No abnormal FDG uptake identified within the liver. Within the neck of pancreas there is a approximately 1.4 cm focus of increased uptake with SUV max of 7.88. Within the body of the pancreas there is a focus of increased uptake measuring 2 cm with SUV max of 14.0. Normal size spleen without focal areas of increased uptake. Adrenal glands appear normal. No FDG avid lymph nodes within the abdomen or pelvis. FDG avid right inguinal node measures 7 mm within SUV max of 6.96, image 199/4. Incidental CT findings: Large fat containing umbilical hernia measures 8.0 x 6.3 cm. Small bilateral fat containing inguinal hernias. SKELETON: There are a few scattered FDG avid bone lesions, without corresponding CT changes. For example, within the left humeral head there is a moderate to intense focus of increased uptake within SUV max of 9.87. Within the transverse process the T10 vertebra on the left there is a focus of increased uptake within SUV max of 8.85. Within the inferior endplate of the L2 vertebral body there is a small focus of  increased uptake with SUV max of 8.27. Incidental CT findings: none IMPRESSION: 1. Examination is positive for large FDG avid mass within the left neck extending from the level 2 nodal chain into the left side of the superior mediastinum compatible with metabolically active tumor. 2. There are 2 foci of increased uptake within the neck and body of pancreas lymphomatous involvement of the  pancreas. 3. Subcentimeter FDG avid right inguinal lymph node also concerning for lymphoma. 4. Several foci of increased uptake within the axial skeleton compatible with malignancy. Electronically Signed   By: Kerby Moors M.D.   On: 08/14/2021 06:22   US Venous Img Lower Bilateral (DVT)  Result Date: 08/01/2021 CLINICAL DATA:  Pulmonary emboli on CTA chest 07/31/2021. Lower extremity edema. EXAM: Bilateral LOWER EXTREMITY VENOUS DOPPLER ULTRASOUND TECHNIQUE: Gray-scale sonography with compression, as well as color and duplex ultrasound, were performed to evaluate the deep venous system(s) from the level of the common femoral vein through the popliteal and proximal calf veins. COMPARISON:  None. FINDINGS: VENOUS Left side: Substantial thrombus in the superficial femoral vein and profundus femoral vein on the left, with no significant compressibility, partially occlusive but not completely occlusive. This extends into the posterior tibial veins and peroneal veins as well as the popliteal vein. The common femoral vein appears patent. Right side: Normal appearance of the common femoral vein, superficial femoral vein, popliteal vein, and calf veins on the right. Expected compressibility and augmentation. OTHER None. Limitations: none IMPRESSION: 1. Positive for left-sided DVT with left superficial femoral vein, profundus femoral vein, popliteal vein, and calf vein thrombosis on the left side, in this patient with recent diagnosis of acute pulmonary embolus. No findings of DVT on the right side. Electronically Signed   By: Van Clines M.D.   On: 08/01/2021 15:50   DG Chest Port 1 View  Result Date: 07/31/2021 CLINICAL DATA:  Wheezing, history of COVID positive. EXAM: PORTABLE CHEST 1 VIEW COMPARISON:  July 28, 2021 FINDINGS: The heart size and mediastinal contours are within normal limits. Both lungs are clear. The visualized skeletal structures are unremarkable. IMPRESSION: No active disease.  Electronically Signed   By: Virgina Norfolk M.D.   On: 07/31/2021 16:22   DG Swallowing Func-Speech Pathology  Result Date: 08/22/2021 Table formatting from the original result was not included. Objective Swallowing Evaluation: Type of Study: MBS-Modified Barium Swallow Study  Patient Details Name: JAMONTA GOERNER MRN: [id number] Date of Birth: Jan 14, 1956 Today's Date: 08/22/2021 Time: SLP Start Time (ACUTE ONLY): 1510 -SLP Stop Time (ACUTE ONLY): 1527 SLP Time Calculation (min) (ACUTE ONLY): 17 min Past Medical History: Past Medical History: Diagnosis Date  Anxiety   Arthritis   Cancer ([place])   COVID   x 2 (2020 and 2022)  Depression   Hypertension  Past Surgical History: Past Surgical History: Procedure Laterality Date  carpel tunnel    MASS BIOPSY Left 07/14/2021  Procedure: NECK MASS BIOPSY;  Surgeon: Melida Quitter, MD;  Location: [facility];  Service: ENT;  Laterality: Left; HPI: KERMIT ARNETTE is a 65 y.o. 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 Most of the history was obtained from niece at bedside, per report, patient has had about 4 episodes of syncope within last week.  First episode lasted several hours, he was taken  to a hospital in [place], he was evaluated for syncope and noted to have elevated CO2 level per ED medical record (10/8).  Patient has since had 3 other episodes, lasted few seconds to a minute with return to baseline within few minutes and without any postictal state. EMS was activated, BP was checked and was hypotensive at 85 and this was associated with diaphoresis. In the ED, noted to be HD stable. Work-up in the ED showed elevated creatinine at 1.41 (baseline creatinine at 1.1-1.3), D-dimer 3.59.  Influenza A, B, SARS coronavirus 2 was negative. CTA chest showed PE.  No findings of right heart strain or pulmonary infarction. Patient was started on heparin drip. Pt reports poor oral intake due to mass in neck and fear of choking.  Pt was eating chopped food prior to admit.  He reports decreased edema due to chemotherapy with improvement. Pt on a puree/thin diet - hoping for diet advancement.  Subjective: pt awake in wheelchair, ready for swallow evaluation Assessment / Plan / Recommendation CHL IP CLINICAL IMPRESSIONS 08/22/2021 Clinical Impression Pt presents with min oropharyngeal dysphagia mostly c/b decreased oral coordination with premature spillage of liquids into pharynx to pyriform sinus with thin.  Pharyngeal swallow is strong without retention with good stripping wave despite edema.  Minimally decreased laryngeal closure (suspect due to edema) allows minimal laryngeal penetration of thin liquids. No aspiration observed during the study despite pt being tested with sequential swallows.  Pt did cough x2 - once due to trace oral retention of liquids and secretions spilling into larynx post-swallow.  Pt did not transit tablet with thin liquids despite 2 trials, but easily transited with pudding.  Tablet appeared to transiently halt at LES - without pt awareness.  Additional liquid swallow faciliated tablet transiting into his stomach.  Recommend to advance diet to dys3/thin - medicine with puree.  SLP will follow up with pt for education/po tolerance/compensation strategy effectiveness.  Recommend follow aspiration and reflux precautions. Oral care important due to suspected secretion aspiration and pt's chemo treatment impact on immune system increasing risk of pna if he aspirate. SLP Visit Diagnosis Dysphagia, oropharyngeal phase (R13.12) Attention and concentration deficit following -- Frontal lobe and executive function deficit following -- Impact on safety and function Mild aspiration risk   CHL IP TREATMENT RECOMMENDATION 08/22/2021 Treatment Recommendations Therapy as  outlined in treatment plan below   Prognosis 08/22/2021 Prognosis for Safe Diet Advancement Good Barriers to Reach Goals -- Barriers/Prognosis Comment -- CHL IP DIET RECOMMENDATION 08/22/2021 SLP Diet Recommendations Dysphagia 3 (Mech soft) solids;Thin liquid Liquid Administration via Cup;Straw Medication Administration Whole meds with puree Compensations Slow rate;Small sips/bites Postural Changes Remain semi-upright after after feeds/meals (Comment);Seated upright at 90 degrees   CHL IP OTHER RECOMMENDATIONS 08/22/2021 Recommended Consults -- Oral Care Recommendations Oral care BID Other Recommendations --   CHL IP FOLLOW UP RECOMMENDATIONS 08/22/2021 Follow up Recommendations (No Data)   CHL IP FREQUENCY AND DURATION 08/22/2021 Speech Therapy Frequency (ACUTE ONLY) min 1 x/week Treatment Duration 1 week      CHL IP ORAL PHASE 08/22/2021 Oral Phase Impaired Oral - Pudding Teaspoon -- Oral - Pudding Cup -- Oral - Honey Teaspoon -- Oral - Honey Cup -- Oral - Nectar Teaspoon -- Oral - Nectar Cup -- Oral - Nectar Straw Premature spillage Oral - Thin Teaspoon -- Oral - Thin Cup Premature spillage Oral - Thin Straw Premature spillage Oral - Puree WFL Oral - Mech Soft WFL Oral - Regular -- Oral - Multi-Consistency -- Oral -  Pill Reduced posterior propulsion Oral Phase - Comment --  CHL IP PHARYNGEAL PHASE 08/22/2021 Pharyngeal Phase Impaired Pharyngeal- Pudding Teaspoon -- Pharyngeal -- Pharyngeal- Pudding Cup -- Pharyngeal -- Pharyngeal- Honey Teaspoon -- Pharyngeal -- Pharyngeal- Honey Cup -- Pharyngeal -- Pharyngeal- Nectar Teaspoon -- Pharyngeal -- Pharyngeal- Nectar Cup -- Pharyngeal -- Pharyngeal- Nectar Straw Delayed swallow initiation-vallecula Pharyngeal Material does not enter airway Pharyngeal- Thin Teaspoon -- Pharyngeal -- Pharyngeal- Thin Cup Delayed swallow initiation-pyriform sinuses Pharyngeal Material enters airway, CONTACTS cords and then ejected out Pharyngeal- Thin Straw Delayed swallow initiation-pyriform  sinuses Pharyngeal Material enters airway, CONTACTS cords and then ejected out Pharyngeal- Puree WFL Pharyngeal Material does not enter airway Pharyngeal- Mechanical Soft Delayed swallow initiation-vallecula Pharyngeal Material does not enter airway Pharyngeal- Regular -- Pharyngeal -- Pharyngeal- Multi-consistency -- Pharyngeal -- Pharyngeal- Pill Delayed swallow initiation-vallecula Pharyngeal Material does not enter airway Pharyngeal Comment Pt with minimal laryngeal penetration of thin liquids due to minimally decreased laryngeal closure - no aspiration noted; cough noted x2 during testing - suspect some secretion aspiration.  Swallow triggers with liquids at pyriform sinus as boluses spill to pyriforms prior to swallow trigger. Pharyngeal swallow is strong without retention - with good stripping wave despite edema.  Barium tablet with pudding transited through pharynx into esophagus - but appeared to stall at LES - without pt awareness.  Additional liquid swallow appeared to push tablet into stomach.  Recommend follow aspiration and reflux precautions.  CHL IP CERVICAL ESOPHAGEAL PHASE 08/22/2021 Cervical Esophageal Phase Impaired Pudding Teaspoon -- Pudding Cup -- Honey Teaspoon -- Honey Cup -- Nectar Teaspoon -- Nectar Cup -- Nectar Straw -- Thin Teaspoon -- Thin Cup -- Thin Straw -- Puree -- Mechanical Soft -- Regular -- Multi-consistency -- Pill -- Cervical Esophageal Comment -- Kathleen Lime, MS [facility] SLP Acute Rehab Services Office [phone] Pager [phone] Macario Golds 08/22/2021, 5:03 PM              ECHOCARDIOGRAM COMPLETE  Result Date: 08/01/2021    ECHOCARDIOGRAM REPORT   Patient Name:   EMERICK WEATHERLY Date of Exam: 08/01/2021 Medical Rec #:  [id number]       Height:       69.0 in Accession #:    [id number]      Weight:       323.4 lb Date of Birth:  [birth year]       BSA:          2.534 m Patient Age:    36 years        BP:           149/82 mmHg Patient Gender: M               HR:            95 bpm. Exam Location:  [facility] Procedure: 2D Echo, Cardiac Doppler and Color Doppler Indications:    Pulmonary Embolus I26.09  History:        Patient has no prior history of Echocardiogram examinations.                 Risk Factors:Hypertension and Dyslipidemia. Obesity. OSA                 (obstructive sleep apnea). Covid-19 infection. Cancer ([place])                 (From Hx).  Sonographer:    Alvino Chapel RCS Referring Phys: [id number] [place]  1. Left ventricular ejection fraction,  by estimation, is 60 to 65%. The left ventricle has normal function. The left ventricle has no regional wall motion abnormalities. Left ventricular diastolic parameters were normal.  2. Right ventricular systolic function is normal. The right ventricular size is normal. Tricuspid regurgitation signal is inadequate for assessing PA pressure.  3. The mitral valve is grossly normal. No evidence of mitral valve regurgitation. No evidence of mitral stenosis.  4. The aortic valve is tricuspid. Aortic valve regurgitation is not visualized. No aortic stenosis is present.  5. The inferior vena cava is normal in size with greater than 50% respiratory variability, suggesting right atrial pressure of 3 mmHg. Conclusion(s)/Recommendation(s): Normal biventricular function without evidence of hemodynamically significant valvular heart disease. FINDINGS  Left Ventricle: Left ventricular ejection fraction, by estimation, is 60 to 65%. The left ventricle has normal function. The left ventricle has no regional wall motion abnormalities. The left ventricular internal cavity size was normal in size. There is  no left ventricular hypertrophy. Left ventricular diastolic parameters were normal. Right Ventricle: The right ventricular size is normal. No increase in right ventricular wall thickness. Right ventricular systolic function is normal. Tricuspid regurgitation signal is inadequate for assessing PA pressure. Left Atrium: Left  atrial size was normal in size. Right Atrium: Right atrial size was normal in size. Pericardium: Trivial pericardial effusion is present. Presence of pericardial fat pad. Mitral Valve: The mitral valve is grossly normal. No evidence of mitral valve regurgitation. No evidence of mitral valve stenosis. Tricuspid Valve: The tricuspid valve is grossly normal. Tricuspid valve regurgitation is trivial. No evidence of tricuspid stenosis. Aortic Valve: The aortic valve is tricuspid. Aortic valve regurgitation is not visualized. No aortic stenosis is present. Aortic valve mean gradient measures 8.0 mmHg. Aortic valve peak gradient measures 16.5 mmHg. Aortic valve area, by VTI measures 2.63  cm. Pulmonic Valve: The pulmonic valve was grossly normal. Pulmonic valve regurgitation is not visualized. No evidence of pulmonic stenosis. Aorta: The aortic root is normal in size and structure. Venous: The right lower pulmonary vein is normal. The inferior vena cava is normal in size with greater than 50% respiratory variability, suggesting right atrial pressure of 3 mmHg. IAS/Shunts: The atrial septum is grossly normal.  LEFT VENTRICLE PLAX 2D LVIDd:         4.50 cm   Diastology LVIDs:         2.70 cm   LV e' medial:    11.10 cm/s LV PW:         1.10 cm   LV E/e' medial:  7.6 LV IVS:        1.10 cm   LV e' lateral:   12.30 cm/s LVOT diam:     2.00 cm   LV E/e' lateral: 6.8 LV SV:         97 LV SV Index:   38 LVOT Area:     3.14 cm  RIGHT VENTRICLE RV S prime:     21.10 cm/s TAPSE (M-mode): 3.2 cm LEFT ATRIUM             Index        RIGHT ATRIUM           Index LA diam:        3.60 cm 1.42 cm/m   RA Area:     11.70 cm LA Vol (A2C):   33.8 ml 13.34 ml/m  RA Volume:   26.20 ml  10.34 ml/m LA Vol (A4C):   48.7 ml 19.22 ml/m LA Biplane  Vol: 42.7 ml 16.85 ml/m  AORTIC VALVE AV Area (Vmax):    2.62 cm AV Area (Vmean):   2.82 cm AV Area (VTI):     2.63 cm AV Vmax:           203.00 cm/s AV Vmean:          135.000 cm/s AV VTI:             0.371 m AV Peak Grad:      16.5 mmHg AV Mean Grad:      8.0 mmHg LVOT Vmax:         169.00 cm/s LVOT Vmean:        121.000 cm/s LVOT VTI:          0.310 m LVOT/AV VTI ratio: 0.84  AORTA Ao Root diam: 3.40 cm MITRAL VALVE MV Area (PHT): 2.83 cm     SHUNTS MV Decel Time: 268 msec     Systemic VTI:  0.31 m MV E velocity: 83.90 cm/s   Systemic Diam: 2.00 cm MV A velocity: 112.00 cm/s MV E/A ratio:  0.75 Eleonore Chiquito MD Electronically signed by Eleonore Chiquito MD Signature Date/Time: 08/01/2021/4:20:57 PM    Final    IR PICC PLACEMENT RIGHT >5 YRS INC IMG GUIDE  Result Date: 08/14/2021 INDICATION: Recent diagnosis of lymphoma. Request made for placement of a PICC line for the initiation of chemotherapy Note, initial plan was to proceed with port a catheter placement however the patient has recently been diagnosed with DVT and pulmonary embolism and referring oncologist has requested a PICC line initially as the patient is begun on anticoagulation. EXAM: ULTRASOUND AND FLUOROSCOPIC GUIDED PICC LINE INSERTION MEDICATIONS: None. CONTRAST:  None FLUOROSCOPY TIME:  18 seconds (2 mGy) COMPLICATIONS: None immediate. TECHNIQUE: The procedure, risks, benefits, and alternatives were explained to the patient's niece and informed written consent was obtained. A timeout was performed prior to the initiation of the procedure. The right upper extremity was prepped with chlorhexidine in a sterile fashion, and a sterile drape was applied covering the operative field. Maximum barrier sterile technique with sterile gowns and gloves were used for the procedure. A timeout was performed prior to the initiation of the procedure. Local anesthesia was provided with 1% lidocaine. Under direct ultrasound guidance, the brachial vein was accessed with a micropuncture kit after the overlying soft tissues were anesthetized with 1% lidocaine. Real-time ultrasound guidance was utilized for vascular access including the acquisition of a  permanent ultrasound image documenting patency of the accessed vessel. A guidewire was advanced to the level of the superior caval-atrial junction for measurement purposes and the PICC line was cut to length. A peel-away sheath was placed and a 40 cm, 5 [place], dual lumen was inserted to level of the superior caval-atrial junction. A post procedure spot fluoroscopic was obtained. The catheter easily aspirated and flushed and was secured in place with stat lock device. A dressing was applied. The patient tolerated the procedure well without immediate post procedural complication. FINDINGS: After catheter placement, the tip lies within the superior cavoatrial junction. The catheter aspirates and flushes normally and is ready for immediate use. IMPRESSION: Successful ultrasound and fluoroscopic guided placement of a right brachial vein approach, 40 cm, 5 French, dual lumen PICC with tip at the superior caval-atrial junction. The PICC line is ready for immediate use. Electronically Signed   By: Sandi Mariscal M.D.   On: 08/14/2021 17:27     Subjective: Seen and examined at bedside and he is  doing fairly well.  Had a bowel movement earlier.  Thinks his neck is is not as tender to touch.  No nausea or vomiting.  Tolerating his diet.  Denies any shortness of breath or cough.  Feels well and ready to go home.  Did qualify for supplemental oxygen will be discharged on 2 L of an oxygen nightly given his nocturnal desaturations.   Discharge Exam: Vitals:   08/25/21 0631 08/25/21 1503  BP: 113/71 (!) 153/93  Pulse: 95 (!) 108  Resp: 16 19  Temp: 98.6 F (37 C) 98 F (36.7 C)  SpO2: 91% 100%   Vitals:   08/24/21 1429 08/24/21 2025 08/25/21 0631 08/25/21 1503  BP: 119/70 136/76 113/71 (!) 153/93  Pulse: 100 97 95 (!) 108  Resp: '17 17 16 19  ' Temp: 98.1 F (36.7 C) 97.7 F (36.5 C) 98.6 F (37 C) 98 F (36.7 C)  TempSrc: Oral Oral Oral Oral  SpO2: 95% 93% 91% 100%  Weight:      Height:       General: Pt  is alert, awake, not in acute distress; has a large neck mass on the left side Cardiovascular: RRR, S1/S2 +, no rubs, no gallops Respiratory: Diminished bilaterally, no wheezing, no rhonchi Abdominal: Soft, NT, distended secondary habitus, bowel sounds + Extremities: no edema, no cyanosis  The results of significant diagnostics from this hospitalization (including imaging, microbiology, ancillary and laboratory) are listed below for reference.    Microbiology: Recent Results (from the past 240 hour(s))  Resp Panel by RT-PCR (Flu A&B, Covid) Nasopharyngeal Swab     Status: None   Collection Time: 08/15/21  8:40 PM   Specimen: Nasopharyngeal Swab; Nasopharyngeal(NP) swabs in vial transport medium  Result Value Ref Range Status   SARS Coronavirus 2 by RT PCR NEGATIVE NEGATIVE Final    Comment: (NOTE) SARS-CoV-2 target nucleic acids are NOT DETECTED.  The SARS-CoV-2 RNA is generally detectable in upper respiratory specimens during the acute phase of infection. The lowest concentration of SARS-CoV-2 viral copies this assay can detect is 138 copies/mL. A negative result does not preclude SARS-Cov-2 infection and should not be used as the sole basis for treatment or other patient management decisions. A negative result may occur with  improper specimen collection/handling, submission of specimen other than nasopharyngeal swab, presence of viral mutation(s) within the areas targeted by this assay, and inadequate number of viral copies(<138 copies/mL). A negative result must be combined with clinical observations, patient history, and epidemiological information. The expected result is Negative.  Fact Sheet for Patients:  EntrepreneurPulse.com.au  Fact Sheet for Healthcare Providers:  IncredibleEmployment.be  This test is no t yet approved or cleared by the [place] FDA and  has been authorized for detection and/or diagnosis of SARS-CoV-2 by FDA  under an Emergency Use Authorization (EUA). This EUA will remain  in effect (meaning this test can be used) for the duration of the COVID-19 declaration under Section 564(b)(1) of the Act, 21 U.S.C.section 360bbb-3(b)(1), unless the authorization is terminated  or revoked sooner.       Influenza A by PCR NEGATIVE NEGATIVE Final   Influenza B by PCR NEGATIVE NEGATIVE Final    Comment: (NOTE) The Xpert Xpress SARS-CoV-2/FLU/RSV plus assay is intended as an aid in the diagnosis of influenza from Nasopharyngeal swab specimens and should not be used as a sole basis for treatment. Nasal washings and aspirates are unacceptable for Xpert Xpress SARS-CoV-2/FLU/RSV testing.  Fact Sheet for Patients: EntrepreneurPulse.com.au  Fact Sheet for  Healthcare Providers: IncredibleEmployment.be  This test is not yet approved or cleared by the [place] and has been authorized for detection and/or diagnosis of SARS-CoV-2 by FDA under an Emergency Use Authorization (EUA). This EUA will remain in effect (meaning this test can be used) for the duration of the COVID-19 declaration under Section 564(b)(1) of the Act, 21 U.S.C. section 360bbb-3(b)(1), unless the authorization is terminated or revoked.  Performed at [facility], [place] [address] [place], [place] [address]     Labs: BNP (last 3 results) Recent Labs    08/15/21 1354  BNP 82.5   Basic Metabolic Panel: Recent Labs  Lab 08/21/21 0513 08/22/21 0623 08/23/21 0500 08/24/21 0515 08/25/21 0500  NA 135 137 137 136 137  K 3.9 3.9 3.7 4.0 3.8  CL 97* 97* 95* 95* 97*  CO2 32 35* 34* 35* 32  GLUCOSE 88 87 86 86 97  BUN '19 17 19 21 19  ' CREATININE 0.79 0.90 0.82 0.93 0.80  CALCIUM 8.4* 8.4* 8.6* 8.4* 8.5*  MG  --   --  2.0 1.9 1.9  PHOS  --   --  4.0 4.0 4.2   Liver Function Tests: Recent Labs  Lab 08/21/21 0513 08/22/21 1000 08/23/21 0500 08/24/21 0515  08/25/21 0500  AST 48* 58* 47* 47* 37  ALT 107* 151* 159* 161* 154*  ALKPHOS 62 72 82 88 77  BILITOT 0.4 0.7 0.7 0.9 0.5  PROT 6.5 6.3* 6.2* 6.2* 6.1*  ALBUMIN 3.2* 3.3* 2.9* 3.1* 3.0*   No results for input(s): LIPASE, AMYLASE in the last 168 hours. No results for input(s): AMMONIA in the last 168 hours. CBC: Recent Labs  Lab 08/20/21 0618 08/21/21 0513 08/22/21 0623 08/23/21 0500 08/24/21 0515 08/25/21 0851  WBC 10.2 20.6* 24.7* 19.0* 9.4 1.8*  NEUTROABS 8.5* 19.2*  --  16.7* 6.4 0.6*  HGB 13.2 13.0 13.1 13.0 12.6* 12.5*  HCT 39.6 40.0 40.5 39.7 38.4* 38.4*  MCV 91.2 93.7 94.0 93.2 92.1 92.1  PLT 213 190 155 155 148* 129*   Cardiac Enzymes: No results for input(s): CKTOTAL, CKMB, CKMBINDEX, TROPONINI in the last 168 hours. BNP: Invalid input(s): POCBNP CBG: No results for input(s): GLUCAP in the last 168 hours. D-Dimer No results for input(s): DDIMER in the last 72 hours. Hgb A1c No results for input(s): HGBA1C in the last 72 hours. Lipid Profile No results for input(s): CHOL, HDL, LDLCALC, TRIG, CHOLHDL, LDLDIRECT in the last 72 hours. Thyroid function studies No results for input(s): TSH, T4TOTAL, T3FREE, THYROIDAB in the last 72 hours.  Invalid input(s): FREET3 Anemia work up No results for input(s): VITAMINB12, FOLATE, FERRITIN, TIBC, IRON, RETICCTPCT in the last 72 hours. Urinalysis    Component Value Date/Time   COLORURINE YELLOW 09/16/2016 0520   APPEARANCEUR CLEAR 09/16/2016 0520   LABSPEC >1.030 (H) 09/16/2016 0520   PHURINE 5.0 09/16/2016 0520   GLUCOSEU NEGATIVE 09/16/2016 0520   HGBUR NEGATIVE 09/16/2016 0520   BILIRUBINUR NEGATIVE 09/16/2016 0520   KETONESUR TRACE (A) 09/16/2016 0520   PROTEINUR 30 (A) 09/16/2016 0520   NITRITE NEGATIVE 09/16/2016 0520   LEUKOCYTESUR NEGATIVE 09/16/2016 0520   Sepsis Labs Invalid input(s): PROCALCITONIN,  WBC,  LACTICIDVEN Microbiology Recent Results (from the past 240 hour(s))  Resp Panel by RT-PCR (Flu  A&B, Covid) Nasopharyngeal Swab     Status: None   Collection Time: 08/15/21  8:40 PM   Specimen: Nasopharyngeal Swab; Nasopharyngeal(NP) swabs in vial transport medium  Result Value Ref Range Status  SARS Coronavirus 2 by RT PCR NEGATIVE NEGATIVE Final    Comment: (NOTE) SARS-CoV-2 target nucleic acids are NOT DETECTED.  The SARS-CoV-2 RNA is generally detectable in upper respiratory specimens during the acute phase of infection. The lowest concentration of SARS-CoV-2 viral copies this assay can detect is 138 copies/mL. A negative result does not preclude SARS-Cov-2 infection and should not be used as the sole basis for treatment or other patient management decisions. A negative result may occur with  improper specimen collection/handling, submission of specimen other than nasopharyngeal swab, presence of viral mutation(s) within the areas targeted by this assay, and inadequate number of viral copies(<138 copies/mL). A negative result must be combined with clinical observations, patient history, and epidemiological information. The expected result is Negative.  Fact Sheet for Patients:  EntrepreneurPulse.com.au  Fact Sheet for Healthcare Providers:  IncredibleEmployment.be  This test is no t yet approved or cleared by the [place] FDA and  has been authorized for detection and/or diagnosis of SARS-CoV-2 by FDA under an Emergency Use Authorization (EUA). This EUA will remain  in effect (meaning this test can be used) for the duration of the COVID-19 declaration under Section 564(b)(1) of the Act, 21 U.S.C.section 360bbb-3(b)(1), unless the authorization is terminated  or revoked sooner.       Influenza A by PCR NEGATIVE NEGATIVE Final   Influenza B by PCR NEGATIVE NEGATIVE Final    Comment: (NOTE) The Xpert Xpress SARS-CoV-2/FLU/RSV plus assay is intended as an aid in the diagnosis of influenza from Nasopharyngeal swab specimens  and should not be used as a sole basis for treatment. Nasal washings and aspirates are unacceptable for Xpert Xpress SARS-CoV-2/FLU/RSV testing.  Fact Sheet for Patients: EntrepreneurPulse.com.au  Fact Sheet for Healthcare Providers: IncredibleEmployment.be  This test is not yet approved or cleared by the [place] FDA and has been authorized for detection and/or diagnosis of SARS-CoV-2 by FDA under an Emergency Use Authorization (EUA). This EUA will remain in effect (meaning this test can be used) for the duration of the COVID-19 declaration under Section 564(b)(1) of the Act, 21 U.S.C. section 360bbb-3(b)(1), unless the authorization is terminated or revoked.  Performed at [facility], [place] [address], [place], [place] [address]    Time coordinating discharge: 35 minutes  SIGNED:  Kerney Elbe, DO [facility] 08/25/2021, 8:14 PM Pager is on [place]  If 7PM-7AM, please contact night-coverage [email or web address]

## 2021-08-25 NOTE — TOC Transition Note (Signed)
Transition of Care [facility]) - CM/SW Discharge Note   Patient Details  Name: Joe [name] MRN: [id number] Date of Birth: 20-Oct-1956  Transition of Care [facility]) CM/SW Contact:  Avraj Lindroth, Marjie Skiff, RN Phone Number: 08/25/2021, 2:29 PM   Clinical Narrative:    TOC still unable to find home health company to do Picc line flushes. Pt has scheduled appointment with Dr. Irene Limbo on 11/7. He will be told to talk to them at the appointment about setting him up for flushes in the office, either at [facility] or at [facility]. O2 ordered for night time use at home. [employer] contacted for 02.     Barriers to Discharge: Continued Medical Work up   Patient Goals and CMS Choice Patient states their goals for this hospitalization and ongoing recovery are:: To get home        Discharge Plan and Services   Discharge Planning Services: CM Consult               Readmission Risk Interventions Readmission Risk Prevention Plan 08/23/2021  Transportation Screening Complete  PCP or Specialist Appt within 3-5 Days Complete  HRI or [place] Complete  Social Work Consult for [place] Planning/Counseling Complete  Palliative Care Screening Not Applicable  Medication Review [employer]) Complete  Some recent data might be hidden

## 2021-08-25 NOTE — Plan of Care (Signed)

## 2021-08-26 DIAGNOSIS — J9612 Chronic respiratory failure with hypercapnia: Secondary | ICD-10-CM | POA: Diagnosis not present

## 2021-08-26 DIAGNOSIS — J9621 Acute and chronic respiratory failure with hypoxia: Secondary | ICD-10-CM | POA: Diagnosis not present

## 2021-08-27 ENCOUNTER — Inpatient Hospital Stay: Payer: BC Managed Care – PPO

## 2021-08-27 ENCOUNTER — Other Ambulatory Visit: Payer: 59

## 2021-08-27 ENCOUNTER — Inpatient Hospital Stay: Payer: BC Managed Care – PPO | Admitting: Hematology

## 2021-08-29 ENCOUNTER — Encounter: Payer: Self-pay | Admitting: Hematology

## 2021-08-29 ENCOUNTER — Other Ambulatory Visit (HOSPITAL_COMMUNITY): Payer: Self-pay

## 2021-08-29 ENCOUNTER — Other Ambulatory Visit: Payer: Self-pay

## 2021-08-29 ENCOUNTER — Inpatient Hospital Stay: Payer: BC Managed Care – PPO

## 2021-08-29 ENCOUNTER — Inpatient Hospital Stay: Payer: BC Managed Care – PPO | Attending: Hematology | Admitting: Hematology

## 2021-08-29 ENCOUNTER — Other Ambulatory Visit: Payer: 59

## 2021-08-29 VITALS — BP 149/93 | HR 96 | Temp 97.8°F | Resp 18 | Ht 69.0 in | Wt 318.9 lb

## 2021-08-29 DIAGNOSIS — Z86711 Personal history of pulmonary embolism: Secondary | ICD-10-CM | POA: Insufficient documentation

## 2021-08-29 DIAGNOSIS — C8211 Follicular lymphoma grade II, lymph nodes of head, face, and neck: Secondary | ICD-10-CM | POA: Insufficient documentation

## 2021-08-29 DIAGNOSIS — Z452 Encounter for adjustment and management of vascular access device: Secondary | ICD-10-CM

## 2021-08-29 DIAGNOSIS — G4733 Obstructive sleep apnea (adult) (pediatric): Secondary | ICD-10-CM | POA: Diagnosis not present

## 2021-08-29 DIAGNOSIS — I10 Essential (primary) hypertension: Secondary | ICD-10-CM | POA: Insufficient documentation

## 2021-08-29 DIAGNOSIS — Z79899 Other long term (current) drug therapy: Secondary | ICD-10-CM | POA: Insufficient documentation

## 2021-08-29 DIAGNOSIS — Z5111 Encounter for antineoplastic chemotherapy: Secondary | ICD-10-CM | POA: Insufficient documentation

## 2021-08-29 DIAGNOSIS — F32A Depression, unspecified: Secondary | ICD-10-CM | POA: Diagnosis not present

## 2021-08-29 DIAGNOSIS — Z7901 Long term (current) use of anticoagulants: Secondary | ICD-10-CM | POA: Insufficient documentation

## 2021-08-29 DIAGNOSIS — C8221 Follicular lymphoma grade III, unspecified, lymph nodes of head, face, and neck: Secondary | ICD-10-CM | POA: Diagnosis not present

## 2021-08-29 DIAGNOSIS — F419 Anxiety disorder, unspecified: Secondary | ICD-10-CM | POA: Diagnosis not present

## 2021-08-29 MED ORDER — HEPARIN SOD (PORK) LOCK FLUSH 100 UNIT/ML IV SOLN
500.0000 [IU] | Freq: Once | INTRAVENOUS | Status: AC
  Administered 2021-08-29: 500 [IU] via INTRAVENOUS

## 2021-08-29 MED ORDER — SODIUM CHLORIDE FLUSH 0.9 % IV SOLN
INTRAVENOUS | 0 refills | Status: DC
  Filled 2021-08-29: qty 1000, 30d supply, fill #0

## 2021-08-29 MED ORDER — SODIUM CHLORIDE 0.9% FLUSH
10.0000 mL | Freq: Once | INTRAVENOUS | Status: AC
  Administered 2021-08-29: 10 mL via INTRAVENOUS

## 2021-08-30 DIAGNOSIS — R55 Syncope and collapse: Secondary | ICD-10-CM | POA: Diagnosis not present

## 2021-08-30 DIAGNOSIS — Z9981 Dependence on supplemental oxygen: Secondary | ICD-10-CM | POA: Diagnosis not present

## 2021-08-30 DIAGNOSIS — F411 Generalized anxiety disorder: Secondary | ICD-10-CM | POA: Diagnosis not present

## 2021-08-30 DIAGNOSIS — C8290 Follicular lymphoma, unspecified, unspecified site: Secondary | ICD-10-CM | POA: Diagnosis not present

## 2021-08-31 ENCOUNTER — Other Ambulatory Visit: Payer: Self-pay

## 2021-08-31 ENCOUNTER — Encounter: Payer: Self-pay | Admitting: Hematology

## 2021-08-31 ENCOUNTER — Other Ambulatory Visit (HOSPITAL_COMMUNITY)
Admission: RE | Admit: 2021-08-31 | Discharge: 2021-08-31 | Disposition: A | Discharge status: Home / Self Care | Payer: 59 | Source: Ambulatory Visit | Attending: Student | Admitting: Student

## 2021-08-31 DIAGNOSIS — Z20822 Contact with and (suspected) exposure to covid-19: Secondary | ICD-10-CM | POA: Diagnosis not present

## 2021-08-31 DIAGNOSIS — Z01818 Encounter for other preprocedural examination: Secondary | ICD-10-CM

## 2021-08-31 DIAGNOSIS — Z01812 Encounter for preprocedural laboratory examination: Secondary | ICD-10-CM | POA: Insufficient documentation

## 2021-08-31 LAB — SARS CORONAVIRUS 2 (TAT 6-24 HRS): SARS Coronavirus 2: NEGATIVE

## 2021-09-04 ENCOUNTER — Ambulatory Visit (HOSPITAL_COMMUNITY)
Admission: RE | Admit: 2021-09-04 | Discharge: 2021-09-04 | Disposition: A | Discharge status: Home / Self Care | Payer: BC Managed Care – PPO | Source: Ambulatory Visit | Attending: Student | Admitting: Student

## 2021-09-04 ENCOUNTER — Other Ambulatory Visit: Payer: Self-pay

## 2021-09-04 ENCOUNTER — Encounter: Payer: Self-pay | Admitting: Hematology

## 2021-09-04 ENCOUNTER — Telehealth: Payer: Self-pay

## 2021-09-04 DIAGNOSIS — F17200 Nicotine dependence, unspecified, uncomplicated: Secondary | ICD-10-CM | POA: Insufficient documentation

## 2021-09-04 LAB — PULMONARY FUNCTION TEST
DL/VA % pred: 106 %
DL/VA: 4.45 ml/min/mmHg/L
DLCO unc % pred: 82 %
DLCO unc: 21.81 ml/min/mmHg
FEF 25-75 Post: 4.47 L/sec
FEF 25-75 Pre: 4.75 L/sec
FEF2575-%Change-Post: -5 %
FEF2575-%Pred-Post: 163 %
FEF2575-%Pred-Pre: 173 %
FEV1-%Change-Post: 0 %
FEV1-%Pred-Post: 85 %
FEV1-%Pred-Pre: 85 %
FEV1-Post: 2.9 L
FEV1-Pre: 2.91 L
FEV1FVC-%Change-Post: 0 %
FEV1FVC-%Pred-Pre: 118 %
FEV6-%Change-Post: 0 %
FEV6-%Pred-Post: 76 %
FEV6-%Pred-Pre: 76 %
FEV6-Post: 3.29 L
FEV6-Pre: 3.26 L
FEV6FVC-%Pred-Post: 105 %
FEV6FVC-%Pred-Pre: 105 %
FVC-%Change-Post: 0 %
FVC-%Pred-Post: 73 %
FVC-%Pred-Pre: 72 %
FVC-Post: 3.29 L
FVC-Pre: 3.26 L
Post FEV1/FVC ratio: 88 %
Post FEV6/FVC ratio: 100 %
Pre FEV1/FVC ratio: 89 %
Pre FEV6/FVC Ratio: 100 %
RV % pred: 113 %
RV: 2.54 L
TLC % pred: 85 %
TLC: 5.79 L

## 2021-09-04 MED ORDER — ALBUTEROL SULFATE (2.5 MG/3ML) 0.083% IN NEBU
2.5000 mg | INHALATION_SOLUTION | Freq: Once | RESPIRATORY_TRACT | Status: AC
  Administered 2021-09-04: 2.5 mg via RESPIRATORY_TRACT

## 2021-09-04 NOTE — Telephone Encounter (Signed)
Called to notify Patient of completion of Disability paperwork. Spoke with niece Jarrett Soho. Fax Transmission Confirmation received from [employer]. Copy of Forms mailed to Patient as requested.

## 2021-09-07 ENCOUNTER — Ambulatory Visit: Payer: 59 | Attending: Student | Admitting: Pulmonary Disease

## 2021-09-07 ENCOUNTER — Inpatient Hospital Stay: Payer: BC Managed Care – PPO

## 2021-09-07 ENCOUNTER — Inpatient Hospital Stay: Payer: BC Managed Care – PPO | Admitting: Dietician

## 2021-09-07 ENCOUNTER — Inpatient Hospital Stay (HOSPITAL_BASED_OUTPATIENT_CLINIC_OR_DEPARTMENT_OTHER): Payer: BC Managed Care – PPO | Admitting: Hematology

## 2021-09-07 ENCOUNTER — Other Ambulatory Visit: Payer: Self-pay

## 2021-09-07 ENCOUNTER — Other Ambulatory Visit: Payer: 59

## 2021-09-07 VITALS — BP 154/89 | HR 98 | Temp 98.5°F | Resp 18 | Ht 69.0 in | Wt 317.5 lb

## 2021-09-07 VITALS — BP 125/62 | HR 97 | Temp 98.6°F | Resp 18

## 2021-09-07 DIAGNOSIS — Z7901 Long term (current) use of anticoagulants: Secondary | ICD-10-CM | POA: Diagnosis not present

## 2021-09-07 DIAGNOSIS — Z86711 Personal history of pulmonary embolism: Secondary | ICD-10-CM | POA: Diagnosis not present

## 2021-09-07 DIAGNOSIS — F419 Anxiety disorder, unspecified: Secondary | ICD-10-CM | POA: Diagnosis not present

## 2021-09-07 DIAGNOSIS — C8211 Follicular lymphoma grade II, lymph nodes of head, face, and neck: Secondary | ICD-10-CM | POA: Diagnosis not present

## 2021-09-07 DIAGNOSIS — Z7189 Other specified counseling: Secondary | ICD-10-CM

## 2021-09-07 DIAGNOSIS — F32A Depression, unspecified: Secondary | ICD-10-CM | POA: Diagnosis not present

## 2021-09-07 DIAGNOSIS — C8221 Follicular lymphoma grade III, unspecified, lymph nodes of head, face, and neck: Secondary | ICD-10-CM

## 2021-09-07 DIAGNOSIS — Z79899 Other long term (current) drug therapy: Secondary | ICD-10-CM | POA: Diagnosis not present

## 2021-09-07 DIAGNOSIS — Z5111 Encounter for antineoplastic chemotherapy: Secondary | ICD-10-CM | POA: Diagnosis not present

## 2021-09-07 DIAGNOSIS — G4733 Obstructive sleep apnea (adult) (pediatric): Secondary | ICD-10-CM

## 2021-09-07 DIAGNOSIS — I10 Essential (primary) hypertension: Secondary | ICD-10-CM | POA: Diagnosis not present

## 2021-09-07 DIAGNOSIS — Z452 Encounter for adjustment and management of vascular access device: Secondary | ICD-10-CM

## 2021-09-07 LAB — CMP (CANCER CENTER ONLY)
ALT: 60 U/L — ABNORMAL HIGH (ref 0–44)
AST: 24 U/L (ref 15–41)
Albumin: 3.4 g/dL — ABNORMAL LOW (ref 3.5–5.0)
Alkaline Phosphatase: 69 U/L (ref 38–126)
Anion gap: 9 (ref 5–15)
BUN: 11 mg/dL (ref 8–23)
CO2: 26 mmol/L (ref 22–32)
Calcium: 9.1 mg/dL (ref 8.9–10.3)
Chloride: 102 mmol/L (ref 98–111)
Creatinine: 1.06 mg/dL (ref 0.61–1.24)
GFR, Estimated: >60 mL/min (ref >60)
Glucose, Bld: 111 mg/dL — ABNORMAL HIGH (ref 70–99)
Potassium: 4.4 mmol/L (ref 3.5–5.1)
Sodium: 137 mmol/L (ref 135–145)
Total Bilirubin: 0.3 mg/dL (ref 0.3–1.2)
Total Protein: 6.9 g/dL (ref 6.5–8.1)

## 2021-09-07 LAB — CBC WITH DIFFERENTIAL (CANCER CENTER ONLY)
Abs Immature Granulocytes: 0.09 10*3/uL — ABNORMAL HIGH (ref 0.00–0.07)
Basophils Absolute: 0.1 10*3/uL (ref 0.0–0.1)
Basophils Relative: 1 %
Eosinophils Absolute: 0.1 10*3/uL (ref 0.0–0.5)
Eosinophils Relative: 1 %
HCT: 38.4 % — ABNORMAL LOW (ref 39.0–52.0)
Hemoglobin: 13 g/dL (ref 13.0–17.0)
Immature Granulocytes: 1 %
Lymphocytes Relative: 18 %
Lymphs Abs: 1.4 10*3/uL (ref 0.7–4.0)
MCH: 30.3 pg (ref 26.0–34.0)
MCHC: 33.9 g/dL (ref 30.0–36.0)
MCV: 89.5 fL (ref 80.0–100.0)
Monocytes Absolute: 1 10*3/uL (ref 0.1–1.0)
Monocytes Relative: 12 %
Neutro Abs: 5.4 10*3/uL (ref 1.7–7.7)
Neutrophils Relative %: 67 %
Platelet Count: 429 10*3/uL — ABNORMAL HIGH (ref 150–400)
RBC: 4.29 MIL/uL (ref 4.22–5.81)
RDW: 15.5 % (ref 11.5–15.5)
WBC Count: 8.1 10*3/uL (ref 4.0–10.5)
nRBC: 0 % (ref 0.0–0.2)

## 2021-09-07 LAB — LACTATE DEHYDROGENASE: LDH: 247 U/L — ABNORMAL HIGH (ref 98–192)

## 2021-09-07 MED ORDER — SODIUM CHLORIDE 0.9% FLUSH
10.0000 mL | INTRAVENOUS | Status: DC | PRN
  Administered 2021-09-07: 10 mL

## 2021-09-07 MED ORDER — SODIUM CHLORIDE 0.9 % IV SOLN
750.0000 mg/m2 | Freq: Once | INTRAVENOUS | Status: AC
  Administered 2021-09-07: 2000 mg via INTRAVENOUS
  Filled 2021-09-07: qty 100

## 2021-09-07 MED ORDER — SODIUM CHLORIDE 0.9% FLUSH
10.0000 mL | Freq: Once | INTRAVENOUS | Status: AC
  Administered 2021-09-07: 10 mL

## 2021-09-07 MED ORDER — VINCRISTINE SULFATE CHEMO INJECTION 1 MG/ML
2.0000 mg | Freq: Once | INTRAVENOUS | Status: AC
  Administered 2021-09-07: 2 mg via INTRAVENOUS
  Filled 2021-09-07: qty 2

## 2021-09-07 MED ORDER — SODIUM CHLORIDE 0.9 % IV SOLN
375.0000 mg/m2 | Freq: Once | INTRAVENOUS | Status: AC
  Administered 2021-09-07: 1000 mg via INTRAVENOUS
  Filled 2021-09-07: qty 100

## 2021-09-07 MED ORDER — ACETAMINOPHEN 325 MG PO TABS
650.0000 mg | ORAL_TABLET | Freq: Once | ORAL | Status: AC
  Administered 2021-09-07: 650 mg via ORAL
  Filled 2021-09-07: qty 2

## 2021-09-07 MED ORDER — PALONOSETRON HCL INJECTION 0.25 MG/5ML
0.2500 mg | Freq: Once | INTRAVENOUS | Status: AC
  Administered 2021-09-07: 0.25 mg via INTRAVENOUS
  Filled 2021-09-07: qty 5

## 2021-09-07 MED ORDER — SODIUM CHLORIDE 0.9 % IV SOLN: Freq: Once | INTRAVENOUS | Status: AC

## 2021-09-07 MED ORDER — DIPHENHYDRAMINE HCL 25 MG PO CAPS
50.0000 mg | ORAL_CAPSULE | Freq: Once | ORAL | Status: AC
  Administered 2021-09-07: 50 mg via ORAL
  Filled 2021-09-07: qty 2

## 2021-09-07 MED ORDER — HEPARIN SOD (PORK) LOCK FLUSH 100 UNIT/ML IV SOLN
500.0000 [IU] | Freq: Once | INTRAVENOUS | Status: AC | PRN
  Administered 2021-09-07: 500 [IU]

## 2021-09-07 MED ORDER — SODIUM CHLORIDE 0.9 % IV SOLN
10.0000 mg | Freq: Once | INTRAVENOUS | Status: AC
  Administered 2021-09-07: 10 mg via INTRAVENOUS
  Filled 2021-09-07: qty 10

## 2021-09-07 MED ORDER — DOXORUBICIN HCL CHEMO IV INJECTION 2 MG/ML
50.0000 mg/m2 | Freq: Once | INTRAVENOUS | Status: AC
  Administered 2021-09-07: 134 mg via INTRAVENOUS
  Filled 2021-09-07: qty 67

## 2021-09-07 MED ORDER — SODIUM CHLORIDE 0.9 % IV SOLN
150.0000 mg | Freq: Once | INTRAVENOUS | Status: AC
  Administered 2021-09-07: 150 mg via INTRAVENOUS
  Filled 2021-09-07: qty 150

## 2021-09-07 MED ORDER — FAMOTIDINE 20 MG IN NS 100 ML IVPB
20.0000 mg | Freq: Once | INTRAVENOUS | Status: AC
  Administered 2021-09-07: 20 mg via INTRAVENOUS
  Filled 2021-09-07: qty 100

## 2021-09-07 NOTE — Patient Instructions (Signed)
[facility]  Discharge Instructions: Thank you for choosing [facility] to provide your oncology and hematology care.   If you have a lab appointment with the [facility], please go directly to the [facility] and check in at the registration area.   Wear comfortable clothing and clothing appropriate for easy access to any Portacath or PICC line.   We strive to give you quality time with your provider. You may need to reschedule your appointment if you arrive late (15 or more minutes).  Arriving late affects you and other patients whose appointments are after yours.  Also, if you miss three or more appointments without notifying the office, you may be dismissed from the clinic at the provider's discretion.      For prescription refill requests, have your pharmacy contact our office and allow 72 hours for refills to be completed.    Today you received the following chemotherapy and/or immunotherapy agents: Adriamycin, vincristine, cytoxan, rituxan.      To help prevent nausea and vomiting after your treatment, we encourage you to take your nausea medication as directed.  BELOW ARE SYMPTOMS THAT SHOULD BE REPORTED IMMEDIATELY: *FEVER GREATER THAN 100.4 F (38 C) OR HIGHER *CHILLS OR SWEATING *NAUSEA AND VOMITING THAT IS NOT CONTROLLED WITH YOUR NAUSEA MEDICATION *UNUSUAL SHORTNESS OF BREATH *UNUSUAL BRUISING OR BLEEDING *URINARY PROBLEMS (pain or burning when urinating, or frequent urination) *BOWEL PROBLEMS (unusual diarrhea, constipation, pain near the anus) TENDERNESS IN MOUTH AND THROAT WITH OR WITHOUT PRESENCE OF ULCERS (sore throat, sores in mouth, or a toothache) UNUSUAL RASH, SWELLING OR PAIN  UNUSUAL VAGINAL DISCHARGE OR ITCHING   Items with * indicate a potential emergency and should be followed up as soon as possible or go to the Emergency Department if any problems should occur.  Please show the CHEMOTHERAPY ALERT CARD or  IMMUNOTHERAPY ALERT CARD at check-in to the Emergency Department and triage nurse.  Should you have questions after your visit or need to cancel or reschedule your appointment, please contact [facility]  Dept: [phone]  and follow the prompts.  Office hours are 8:00 a.m. to 4:30 p.m. Monday - Friday. Please note that voicemails left after 4:00 p.m. may not be returned until the following business day.  We are closed weekends and major holidays. You have access to a nurse at all times for urgent questions. Please call the main number to the clinic Dept: [phone] and follow the prompts.   For any non-urgent questions, you may also contact your provider using MyChart. We now offer e-Visits for anyone 8 and older to request care online for non-urgent symptoms. For details visit [email or web address].   Also download the MyChart app! Go to the app store, search "MyChart", open the app, select [facility], and log in with your MyChart username and password.  Due to Covid, a mask is required upon entering the hospital/clinic. If you do not have a mask, one will be given to you upon arrival. For doctor visits, patients may have 1 support person aged 22 or older with them. For treatment visits, patients cannot have anyone with them due to current Covid guidelines and our immunocompromised population.

## 2021-09-07 NOTE — Progress Notes (Addendum)
[name]   HEMATOLOGY/ONCOLOGY CLINIC NOTE  Date of Service: .08/29/2021   Patient Care Team: Celene Squibb, MD as PCP - General (Internal Medicine)  CHIEF COMPLAINTS/PURPOSE OF CONSULTATION:  Left neck mass/newly diagnosed non-Hodgkin's lymphoma.  HISTORY OF PRESENTING ILLNESS:  Joe [name] is a wonderful 65 y.o. male who has been referred to [place] by Dr .[place] Crane, Edwinna Areola, MD for evaluation and management of newly diagnosed non-Hodgkin's lymphoma.  Patient has a history of morbid obesity (.Body mass index is 47.09 kg/m.),  Sleep apnea not currently on CPAP ,arthritis, anxiety, hypertension, depression and first presented to the emergency room on 07/09/2021 with newly noted left-sided neck mass which showed up over a week.  He initially went to urgent care and was treated with Augmentin for possible infectious etiology but came to the emergency room when his left neck mass continued to get larger and more uncomfortable.  He notes some difficulty with breathing also at times feels like it is a little more difficult to swallow. Patient had a CT soft tissue of the neck with contrast on 07/09/2021 which showed 1. Large heterogeneous soft tissue mass measuring 7.9 x 6.1 by 8.2 cm in the left neck, extending from the skull base/parapharyngeal space inferiorly to the supraclavicular fossa, located immediately subjacent to and inseparable from the left sternocleidomastoid. The left internal jugular vein is obliterated by the mass and left carotid is inseparable from the mass, poorly visualized. Findings could represent malignancy or infectious phlegmon. Soft tissue sampling could provide more definitive diagnosis. 2. Nonspecific mild surrounding stranding and scattered prominent lymph nodes throughout the neck.  Patient was referred to ENT and had a surgical biopsy of the left neck mass by Dr. Melida Quitter on 07/14/2021 which showed Non-Hodgkin B-cell lymphoma, The majority of the fragments have extensive crush  artifact hampering  evaluation. One fragment is well preserved and demonstrates an atypical  lymphoid proliferation with predominately small irregular lymphocytes.  Immunohistochemistry is positive for CD20, bcl-2, bcl-6 and CD10. CD23  is largely negative. CyclinD1 and light chain in situ hybridization are  negative. Ki-67 is 20-25%. CD3 and CD5 highlight scattered T-cells. Flow  cytometry ([id number]) reveals predominately T-cells. Overall, the  findings are consistent with a non-Hodgkin B-cell lymphoma, specifically  follicular lymphoma. There is only limited preserved tissue hampering grading, but this area appears low grade (grade 1-2 of 3). A higher grade process in the crushed fragments cannot be excluded.  Patient again presented to the emergency room on 07/28/2021 with an episode of shaking reportedly some confusion to rule out seizure.  He had a CT of the head which showed no acute intracranial process.  Patient is here to discuss further evaluation and management of his newly diagnosed follicular lymphoma.  He notes some increased fatigue but no overt fevers chills night sweats or unexpected sudden weight loss. No other overt new focal symptoms other than in the neck. No new chest pain or shortness of breath. No abdominal pain, abdominal distention or change in bowel habits.  Patient notes no previous known heart issues. Has had COVID-19 in 2020 and 2022 denies any chronic lung issues. Does appear to have significant sleep apnea based on symptoms and known history of previous sleep apnea but has never been on CPAP.  His episodes of confusion difficulty waking up and difficulty waking up from sedation there is certainly a strong concern for untreated sleep apnea.  Patient and his wife notes that he has been scheduled for a sleep study.  INTERVAL  HISTORY  Patient is here for follow-up regarding his recently diagnosed high-grade follicular lymphoma.  He he is here for follow-up for toxicity  check after having received his first cycle of R-CHOP in the hospital.  He notes his left neck mass has significantly decreased in size.  He notes no further episodes of presyncope or syncope.  Has been using oxygen at home while sleeping and is following up with pulmonary to arrange for his sleep study. No fevers no chills no night sweats. No nausea or vomiting. No other acute toxicities noted from his first cycle of R-CHOP chemotherapy. PICC line flushes are being done at home and were provided from the vascular outpatient pharmacy. Labs 08/25/2021 were reviewed.  Show hemoglobin of 12.5, WBC count of 1.8k with ANC of 600, platelets of 129k CMP with ALT of 154 albumin of 3 otherwise unremarkable normal creatinine of 0.8 Phosphorus 4.2 Magnesium 1.9   MEDICAL HISTORY:  Past Medical History:  Diagnosis Date   Anxiety    Arthritis    Cancer ([place])    COVID    x 2 (2020 and 2022)   Depression    Hypertension     SURGICAL HISTORY: Past Surgical History:  Procedure Laterality Date   carpel tunnel     MASS BIOPSY Left 07/14/2021   Procedure: NECK MASS BIOPSY;  Surgeon: Melida Quitter, MD;  Location: [facility] OR;  Service: ENT;  Laterality: Left;    SOCIAL HISTORY: Social History   Socioeconomic History   Marital status: Widowed    Spouse name: Not on file   Number of children: Not on file   Years of education: Not on file   Highest education level: Not on file  Occupational History   Not on file  Tobacco Use   Smoking status: Former    Types: Cigarettes   Smokeless tobacco: Former    Types: Chew  Substance and Sexual Activity   Alcohol use: Yes    Comment: rare   Drug use: No   Sexual activity: Not on file  Other Topics Concern   Not on file  Social History Narrative   Not on file   Social Determinants of Health   Financial Resource Strain: Not on file  Food Insecurity: Not on file  Transportation Needs: Not on file  Physical Activity: Not on file  Stress: Not on file   Social Connections: Not on file  Intimate Partner Violence: Not on file    FAMILY HISTORY: Family History  Problem Relation Age of Onset   Healthy Mother    Healthy Father     ALLERGIES:  has No Known Allergies.  MEDICATIONS:  Current Outpatient Medications  Medication Sig Dispense Refill   acetaminophen (TYLENOL) 325 MG tablet Take 2 tablets (650 mg total) by mouth every 6 (six) hours as needed for mild pain (or Fever >/= 101). 20 tablet 0   allopurinol (ZYLOPRIM) 300 MG tablet Take 300 mg by mouth daily.     amLODipine (NORVASC) 10 MG tablet Take 10 mg by mouth daily.     enoxaparin (LOVENOX) 120 MG/0.8ML injection Inject 0.8 mLs (120 mg total) into the skin every 12 (twelve) hours. 48 mL 0   ergocalciferol (VITAMIN D2) 1.25 MG (50000 UT) capsule Take 1 capsule (50,000 Units total) by mouth once a week. (Patient taking differently: Take 50,000 Units by mouth once a week. on Wednesday) 12 capsule 0   feeding supplement (ENSURE ENLIVE / ENSURE PLUS) LIQD Take 237 mLs by mouth 2 (two)  times daily between meals. 237 mL 12   ondansetron (ZOFRAN) 8 MG tablet Take 1 tablet (8 mg total) by mouth 2 (two) times daily as needed for refractory nausea / vomiting. Start on day 3 after cyclophosphamide chemotherapy. 30 tablet 1   predniSONE (DELTASONE) 20 MG tablet Take 3 tablets (60 mg total) by mouth daily. Take with food on days 2-6 of chemotherapy. 25 tablet 5   prochlorperazine (COMPAZINE) 10 MG tablet Take 1 tablet (10 mg total) by mouth every 6 (six) hours as needed (Nausea or vomiting). 30 tablet 6   sertraline (ZOLOFT) 50 MG tablet Take 50 mg by mouth daily.     simvastatin (ZOCOR) 40 MG tablet Take 40 mg by mouth 3 (three) times a week. (Patient not taking: Reported on 08/16/2021)     sodium chloride flush (NS) 0.9 % SOLN 10-40 mLs by Intracatheter route as needed (flush). 1000 mL 0   sodium chloride flush 0.9 % SOLN injection Inject 10-3ms as needed for intracatheter flush. 1000 mL 0    Sodium Chloride-Sodium Bicarb (SODIUM BICARBONATE/SODIUM CHLORIDE) SOLN 1 application by Mouth Rinse route as needed for dry mouth. 1000 mL 0   No current facility-administered medications for this visit.   Facility-Administered Medications Ordered in Other Visits  Medication Dose Route Frequency Provider Last Rate Last Admin   heparin lock flush 100 unit/mL  500 Units Intracatheter Once PRN KBrunetta Genera MD       sodium chloride flush (NS) 0.9 % injection 10 mL  10 mL Intracatheter PRN KBrunetta Genera MD        REVIEW OF SYSTEMS:   .10 Point review of Systems was done is negative except as noted above.    PHYSICAL EXAMINATION: ECOG PERFORMANCE STATUS: 1 - Symptomatic but completely ambulatory  . Vitals:   08/29/21 1328  BP: (!) 149/93  Pulse: 96  Resp: 18  Temp: 97.8 F (36.6 C)  SpO2: 100%   Filed Weights   08/29/21 1328  Weight: (!) 318 lb 14.4 oz (144.7 kg)   .Body mass index is 47.09 kg/m.  GENERAL:alert, in no acute distress and comfortable SKIN: no acute rashes, no significant lesions EYES: conjunctiva are pink and non-injected, sclera anicteric OROPHARYNX: MMM, no exudates, no oropharyngeal erythema or ulceration NECK: supple, no JVD LYMPH: Left cervical neck mass he is decreased by nearly 30 to 40% ,no palpable lymphadenopathy in the axillary or inguinal regions LUNGS: clear to auscultation b/l with normal respiratory effort HEART: regular rate & rhythm ABDOMEN:  normoactive bowel sounds , non tender, not distended. Extremity: no pedal edema PSYCH: alert & oriented x 3 with fluent speech NEURO: no focal motor/sensory deficits    LABORATORY DATA:  I have reviewed the data as listed  . CBC Latest Ref Rng & Units 08/25/2021 08/24/2021  WBC 4.0 - 10.5 K/uL 1.8(L) 9.4  Hemoglobin 13.0 - 17.0 g/dL 12.5(L) 12.6(L)  Hematocrit 39.0 - 52.0 % 38.4(L) 38.4(L)  Platelets 150 - 400 K/uL 129(L) 148(L)    . CMP Latest Ref Rng & Units 08/25/2021  08/24/2021  Glucose 70 - 99 mg/dL 97 86  BUN 8 - 23 mg/dL 19 21  Creatinine 0.61 - 1.24 mg/dL 0.80 0.93  Sodium 135 - 145 mmol/L 137 136  Potassium 3.5 - 5.1 mmol/L 3.8 4.0  Chloride 98 - 111 mmol/L 97(L) 95(L)  CO2 22 - 32 mmol/L 32 35(H)  Calcium 8.9 - 10.3 mg/dL 8.5(L) 8.4(L)  Total Protein 6.5 - 8.1 g/dL 6.1(L) 6.2(L)  Total Bilirubin 0.3 - 1.2 mg/dL 0.5 0.9  Alkaline Phos 38 - 126 U/L 77 88  AST 15 - 41 U/L 37 47(H)  ALT 0 - 44 U/L 154(H) 161(H)   SURGICAL PATHOLOGY  CASE: [id number]  PATIENT: Joe [name]  Surgical Pathology Report   Clinical History: neck mass (cm)      FINAL MICROSCOPIC DIAGNOSIS:   A. SOFT TISSUE MASS, LEFT NECK, BIOPSY:  - Non-Hodgkin B-cell lymphoma, see comment.   COMMENT:   The majority of the fragments have extensive crush artifact hampering  evaluation. One fragment is well preserved and demonstrates an atypical  lymphoid proliferation with predominately small irregular lymphocytes.  Immunohistochemistry is positive for CD20, bcl-2, bcl-6 and CD10. CD23  is largely negative. CyclinD1 and light chain in situ hybridization are  negative. Ki-67 is 20-25%. CD3 and CD5 highlight scattered T-cells. Flow  cytometry ([id number]) reveals predominately T-cells. Overall, the  findings are consistent with a non-Hodgkin B-cell lymphoma, specifically  follicular lymphoma. There is only limited preserved tissue hampering  grading, but this area appears low grade (grade 1-2 of 3). A higher  grade process in the crushed fragments cannot be excluded. Clinical  correlation is recommended. Dr. Redmond Baseman was attempted on 07/18/2021.   RADIOGRAPHIC STUDIES: I have personally reviewed the radiological images as listed and agreed with the findings in the report. CT Angio Head W or Wo Contrast  Result Date: 08/15/2021 CLINICAL DATA:  Syncope and hypoxia, large neck mass due to non-Hodgkin's lymphoma EXAM: CT ANGIOGRAPHY HEAD AND NECK TECHNIQUE: Multidetector CT  imaging of the head and neck was performed using the standard protocol during bolus administration of intravenous contrast. Multiplanar CT image reconstructions and MIPs were obtained to evaluate the vascular anatomy. Carotid stenosis measurements (when applicable) are obtained utilizing NASCET criteria, using the distal internal carotid diameter as the denominator. CONTRAST:  179m OMNIPAQUE IOHEXOL 350 MG/ML SOLN COMPARISON:  CT neck 07/09/2021 FINDINGS: CT HEAD FINDINGS Brain: There is no acute intracranial hemorrhage, extra-axial fluid collection, or acute infarct. The ventricles are normal in size. There is no mass lesion. There is no midline shift. Vascular: No hyperdense vessel or unexpected calcification. The vasculature is assessed info below Skull: Normal. Negative for fracture or focal lesion. Sinuses: There is moderate mucosal thickening with layering fluid in the right maxillary sinus. There is surrounding hyperostosis consistent with chronic maxillary sinusitis. Orbits: The globes and orbits are unremarkable. Review of the MIP images confirms the above findings CTA NECK FINDINGS Aortic arch: The aortic arch is unremarkable. Right carotid system: The right common, internal, and external carotid arteries are patent with no significant stenosis, dissection, or aneurysm. Left carotid system: The proximal left common carotid artery is unremarkable. There is complete encasement of the common carotid artery from the level of the clavicular head through the bifurcation with complete encasement of the internal and external carotid arteries. The internal carotid artery is encased from the bifurcation through the petrous segment. The distal branches of the external carotid artery are not encased. There is no significant narrowing of the left carotid system. There is no evidence of dissection or aneurysm. There is minimal calcified atherosclerotic plaque in the carotid bulb. Vertebral arteries: The vertebral  arteries are patent, with no significant stenosis, dissection, or aneurysm. Skeleton: There is multilevel degenerative change of the cervical spine. There is no acute osseous abnormality or aggressive osseous lesion. Other neck: There is a large infiltrative soft tissue mass throughout the soft tissues of the left neck. The mass measures  up to approximately 8.9 cm AP by 9.3 cm TV by 13.8 cm cc, increased in size since 07/09/2021 when it measured approximately 6.6 cm TV by 6.0 cm AP by 10.0 cm cc, measured again using similar technique. Inferiorly, the mass extends into the upper mediastinum just posterior to the clavicle. The mass is inseparable from the sternocleidomastoid. As above, there is complete encasement of the carotid vasculature without narrowing of the arteries. The airway is shifted to the right and moderately narrowed at the level of the oropharynx. Upper chest: The lungs are assessed on the separately dictated CT chest. Review of the MIP images confirms the above findings CTA HEAD FINDINGS Anterior circulation: The bilateral intracranial ICAs are patent. The bilateral MCAs are patent The bilateral ACAs are patent. There is no aneurysm. Posterior circulation: The bilateral V4 segments are patent. The basilar artery is patent. The bilateral PCAs are patent. The posterior communicating arteries are not definitely identified. Venous sinuses: There is a hypodense filling defect in the right sigmoid sinus. Anatomic variants: As above. Review of the MIP images confirms the above findings IMPRESSION: Since 07/09/2021: 1. Significant interval increase in size of the large mass in the left neck which extends from the skull base superiorly into the upper mediastinum inferiorly. The mass completely encases most of the left common carotid artery, the entire left internal carotid artery from the bifurcation to the skull base, and much of the left external carotid artery and branches. Despite the encasement, there  is no significant narrowing of the left carotid system. The mass results in significant mass effect on surrounding structures and moderate narrowing of the oropharyngeal airway. 2. Otherwise, unremarkable vasculature of the head and neck. 3. Hypodense filling defect in the right sigmoid sinus concerning for nonocclusive thrombus. 4. Chronic right maxillary sinusitis. Electronically Signed   By: Valetta Mole M.D.   On: 08/15/2021 19:24   DG Chest 2 View  Result Date: 08/15/2021 CLINICAL DATA:  Hypoxia.  Lymphoma. EXAM: CHEST - 2 VIEW COMPARISON:  07/31/2021 FINDINGS: Right arm PICC tip in the SVC 4 cm above the right atrium. Heart size is normal. The lungs are clear. No effusions. Left neck mass demonstrated with displacement of the trachea towards the right. IMPRESSION: No active cardiopulmonary disease. PICC tip in the SVC 4 cm above the right atrium. Known left neck mass. Electronically Signed   By: Nelson Chimes M.D.   On: 08/15/2021 14:43   CT Angio Neck W and/or Wo Contrast  Result Date: 08/15/2021 CLINICAL DATA:  Syncope and hypoxia, large neck mass due to non-Hodgkin's lymphoma EXAM: CT ANGIOGRAPHY HEAD AND NECK TECHNIQUE: Multidetector CT imaging of the head and neck was performed using the standard protocol during bolus administration of intravenous contrast. Multiplanar CT image reconstructions and MIPs were obtained to evaluate the vascular anatomy. Carotid stenosis measurements (when applicable) are obtained utilizing NASCET criteria, using the distal internal carotid diameter as the denominator. CONTRAST:  145m OMNIPAQUE IOHEXOL 350 MG/ML SOLN COMPARISON:  CT neck 07/09/2021 FINDINGS: CT HEAD FINDINGS Brain: There is no acute intracranial hemorrhage, extra-axial fluid collection, or acute infarct. The ventricles are normal in size. There is no mass lesion. There is no midline shift. Vascular: No hyperdense vessel or unexpected calcification. The vasculature is assessed info below Skull:  Normal. Negative for fracture or focal lesion. Sinuses: There is moderate mucosal thickening with layering fluid in the right maxillary sinus. There is surrounding hyperostosis consistent with chronic maxillary sinusitis. Orbits: The globes and orbits are unremarkable.  Review of the MIP images confirms the above findings CTA NECK FINDINGS Aortic arch: The aortic arch is unremarkable. Right carotid system: The right common, internal, and external carotid arteries are patent with no significant stenosis, dissection, or aneurysm. Left carotid system: The proximal left common carotid artery is unremarkable. There is complete encasement of the common carotid artery from the level of the clavicular head through the bifurcation with complete encasement of the internal and external carotid arteries. The internal carotid artery is encased from the bifurcation through the petrous segment. The distal branches of the external carotid artery are not encased. There is no significant narrowing of the left carotid system. There is no evidence of dissection or aneurysm. There is minimal calcified atherosclerotic plaque in the carotid bulb. Vertebral arteries: The vertebral arteries are patent, with no significant stenosis, dissection, or aneurysm. Skeleton: There is multilevel degenerative change of the cervical spine. There is no acute osseous abnormality or aggressive osseous lesion. Other neck: There is a large infiltrative soft tissue mass throughout the soft tissues of the left neck. The mass measures up to approximately 8.9 cm AP by 9.3 cm TV by 13.8 cm cc, increased in size since 07/09/2021 when it measured approximately 6.6 cm TV by 6.0 cm AP by 10.0 cm cc, measured again using similar technique. Inferiorly, the mass extends into the upper mediastinum just posterior to the clavicle. The mass is inseparable from the sternocleidomastoid. As above, there is complete encasement of the carotid vasculature without narrowing of the  arteries. The airway is shifted to the right and moderately narrowed at the level of the oropharynx. Upper chest: The lungs are assessed on the separately dictated CT chest. Review of the MIP images confirms the above findings CTA HEAD FINDINGS Anterior circulation: The bilateral intracranial ICAs are patent. The bilateral MCAs are patent The bilateral ACAs are patent. There is no aneurysm. Posterior circulation: The bilateral V4 segments are patent. The basilar artery is patent. The bilateral PCAs are patent. The posterior communicating arteries are not definitely identified. Venous sinuses: There is a hypodense filling defect in the right sigmoid sinus. Anatomic variants: As above. Review of the MIP images confirms the above findings IMPRESSION: Since 07/09/2021: 1. Significant interval increase in size of the large mass in the left neck which extends from the skull base superiorly into the upper mediastinum inferiorly. The mass completely encases most of the left common carotid artery, the entire left internal carotid artery from the bifurcation to the skull base, and much of the left external carotid artery and branches. Despite the encasement, there is no significant narrowing of the left carotid system. The mass results in significant mass effect on surrounding structures and moderate narrowing of the oropharyngeal airway. 2. Otherwise, unremarkable vasculature of the head and neck. 3. Hypodense filling defect in the right sigmoid sinus concerning for nonocclusive thrombus. 4. Chronic right maxillary sinusitis. Electronically Signed   By: Valetta Mole M.D.   On: 08/15/2021 19:24   CT Angio Chest PE W and/or Wo Contrast  Result Date: 08/15/2021 CLINICAL DATA:  Concern for pulmonary embolism. EXAM: CT ANGIOGRAPHY CHEST WITH CONTRAST TECHNIQUE: Multidetector CT imaging of the chest was performed using the standard protocol during bolus administration of intravenous contrast. Multiplanar CT image  reconstructions and MIPs were obtained to evaluate the vascular anatomy. CONTRAST:  179m OMNIPAQUE IOHEXOL 350 MG/ML SOLN COMPARISON:  Chest CT dated 07/31/2021. FINDINGS: Cardiovascular: There is no cardiomegaly or pericardial effusion. The thoracic aorta is unremarkable. Evaluation of the  pulmonary arteries is limited due to respiratory motion artifact and suboptimal visualization of the peripheral branches. No large or central pulmonary artery embolus identified. Right sided PICC now extends superiorly into the right IJ. The PICC appears to bend in the right IJ and turns downward. The tip of the PICC appears to be in the right IJ at the level of the subclavian vein (33/6 and coronal 91/7). Evaluation however is limited due to presence of intravenous contrast. Mediastinum/Nodes: No hilar or mediastinal adenopathy. The esophagus is grossly unremarkable. Partially visualized mass in the left side of the neck measures 9.1 x 8.4 cm and increase in size since the study of 07/09/2021 (previously measuring approximately 7.1 x 6.0 cm). There is associated mass effect and deviation of the trachea to the right. No mediastinal fluid collection. Lungs/Pleura: No focal consolidation, pleural effusion, or pneumothorax. The central airways are patent. Upper Abdomen: No acute abnormality. Musculoskeletal: Degenerative changes of the spine. No acute osseous pathology. Review of the MIP images confirms the above findings. IMPRESSION: 1. No CT evidence of central pulmonary artery embolus. 2. Right sided PICC now extends superiorly into the right IJ. The tip of the PICC appears to be in the right IJ at the level of the subclavian vein. Evaluation however is limited due to presence of intravenous contrast. 3. Partially visualized mass in the left side of the neck has increased in size since the study of 07/09/2021 and is concerning for malignancy. These results were called by telephone at the time of interpretation on 08/15/2021 at  7:39 pm to Dr. Gilford Raid, who verbally acknowledged these results. Electronically Signed   By: Anner Crete M.D.   On: 08/15/2021 19:49   NM PET Image Initial (PI) Skull Base To Thigh  Result Date: 08/14/2021 CLINICAL DATA:  Initial treatment strategy for high-grade follicular lymphoma. EXAM: NUCLEAR MEDICINE PET SKULL BASE TO THIGH TECHNIQUE: 16.9 mCi F-18 FDG was injected intravenously. Full-ring PET imaging was performed from the skull base to thigh after the radiotracer. CT data was obtained and used for attenuation correction and anatomic localization. Fasting blood glucose: 121 mg/dl COMPARISON:  None FINDINGS: Mediastinal blood pool activity: SUV max 3.78 Liver activity: SUV max 4.33 NECK: There is a large nodal mass within the left cervical chain extending from the level 2 region through level 4 and 5. This measures the 9.6 x 8.5 by 13.9 cm and has an SUV max 26.47, image 43/4. The tumor extends into the superior mediastinum along with the left paratracheal region, image 56/4. There is also tumor extension into the prevertebral soft tissue space at the level of the oropharynx with involvement of the right tonsillar fossa. Tumor also extends into the prevertebral soft tissues at the level of the hypopharynx with mass effect upon the hypopharynx with deviation towards the right. Incidental CT findings: none CHEST: Mild FDG uptake associated with 9 mm subcarinal lymph node within SUV max of 3.68, image 83/4. No FDG avid pulmonary nodule. Incidental CT findings: Mild aortic atherosclerosis and coronary artery calcifications. No pleural effusion. ABDOMEN/PELVIS: No abnormal FDG uptake identified within the liver. Within the neck of pancreas there is a approximately 1.4 cm focus of increased uptake with SUV max of 7.88. Within the body of the pancreas there is a focus of increased uptake measuring 2 cm with SUV max of 14.0. Normal size spleen without focal areas of increased uptake. Adrenal glands appear  normal. No FDG avid lymph nodes within the abdomen or pelvis. FDG avid right inguinal node  measures 7 mm within SUV max of 6.96, image 199/4. Incidental CT findings: Large fat containing umbilical hernia measures 8.0 x 6.3 cm. Small bilateral fat containing inguinal hernias. SKELETON: There are a few scattered FDG avid bone lesions, without corresponding CT changes. For example, within the left humeral head there is a moderate to intense focus of increased uptake within SUV max of 9.87. Within the transverse process the T10 vertebra on the left there is a focus of increased uptake within SUV max of 8.85. Within the inferior endplate of the L2 vertebral body there is a small focus of increased uptake with SUV max of 8.27. Incidental CT findings: none IMPRESSION: 1. Examination is positive for large FDG avid mass within the left neck extending from the level 2 nodal chain into the left side of the superior mediastinum compatible with metabolically active tumor. 2. There are 2 foci of increased uptake within the neck and body of pancreas lymphomatous involvement of the pancreas. 3. Subcentimeter FDG avid right inguinal lymph node also concerning for lymphoma. 4. Several foci of increased uptake within the axial skeleton compatible with malignancy. Electronically Signed   By: Kerby Moors M.D.   On: 08/14/2021 06:22   DG Swallowing Func-Speech Pathology  Result Date: 08/22/2021 Table formatting from the original result was not included. Objective Swallowing Evaluation: Type of Study: MBS-Modified Barium Swallow Study  Patient Details Name: Joe [name] MRN: [id number] Date of Birth: [birth year] Today's Date: 08/22/2021 Time: SLP Start Time (ACUTE ONLY): 1510 -SLP Stop Time (ACUTE ONLY): 1527 SLP Time Calculation (min) (ACUTE ONLY): 17 min Past Medical History: Past Medical History: Diagnosis Date  Anxiety   Arthritis   Cancer ([place])   COVID   x 2 (2020 and 2022)  Depression   Hypertension  Past Surgical History:  Past Surgical History: Procedure Laterality Date  carpel tunnel    MASS BIOPSY Left 07/14/2021  Procedure: NECK MASS BIOPSY;  Surgeon: Melida Quitter, MD;  Location: [facility];  Service: ENT;  Laterality: Left; HPI: Joe [name] is a 65 y.o. 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 Most of the history was obtained from niece at bedside, per report, patient has had about 4 episodes of syncope within last week.  First episode lasted several hours, he was taken to a hospital in [place], he was evaluated for syncope and noted to have elevated CO2 level per ED medical record (10/8).  Patient has since had 3 other episodes, lasted few seconds to a minute with return to baseline within few minutes and without any postictal state. EMS was activated, BP was checked and was hypotensive at 85 and this was associated with diaphoresis. In the ED, noted to be HD stable. Work-up in the ED showed elevated creatinine at 1.41 (baseline creatinine at 1.1-1.3), D-dimer 3.59.  Influenza A, B, SARS coronavirus 2 was negative. CTA chest showed PE. No findings of right heart strain or pulmonary infarction. Patient was started on heparin drip. Pt reports poor oral intake due to mass in neck and fear of choking.  Pt was eating chopped food prior to admit.  He reports decreased edema due to chemotherapy with improvement. Pt on a puree/thin diet - hoping for diet advancement.  Subjective: pt awake in wheelchair, ready for swallow evaluation Assessment /  Plan / Recommendation CHL IP CLINICAL IMPRESSIONS 08/22/2021 Clinical Impression Pt presents with min oropharyngeal dysphagia mostly c/b decreased oral coordination with premature spillage of liquids into pharynx to pyriform sinus with thin.  Pharyngeal swallow is strong  without retention with good stripping wave despite edema.  Minimally decreased laryngeal closure (suspect due to edema) allows minimal laryngeal penetration of thin liquids. No aspiration observed during the study despite pt being tested with sequential swallows.  Pt did cough x2 - once due to trace oral retention of liquids and secretions spilling into larynx post-swallow.  Pt did not transit tablet with thin liquids despite 2 trials, but easily transited with pudding.  Tablet appeared to transiently halt at LES - without pt awareness.  Additional liquid swallow faciliated tablet transiting into his stomach.  Recommend to advance diet to dys3/thin - medicine with puree.  SLP will follow up with pt for education/po tolerance/compensation strategy effectiveness.  Recommend follow aspiration and reflux precautions. Oral care important due to suspected secretion aspiration and pt's chemo treatment impact on immune system increasing risk of pna if he aspirate. SLP Visit Diagnosis Dysphagia, oropharyngeal phase (R13.12) Attention and concentration deficit following -- Frontal lobe and executive function deficit following -- Impact on safety and function Mild aspiration risk   CHL IP TREATMENT RECOMMENDATION 08/22/2021 Treatment Recommendations Therapy as outlined in treatment plan below   Prognosis 08/22/2021 Prognosis for Safe Diet Advancement Good Barriers to Reach Goals -- Barriers/Prognosis Comment -- CHL IP DIET RECOMMENDATION 08/22/2021 SLP Diet Recommendations Dysphagia 3 (Mech soft) solids;Thin liquid Liquid Administration via Cup;Straw Medication Administration Whole meds with puree Compensations Slow rate;Small sips/bites Postural Changes Remain semi-upright after after feeds/meals (Comment);Seated upright at 90 degrees   CHL IP OTHER RECOMMENDATIONS 08/22/2021 Recommended Consults -- Oral Care Recommendations Oral care BID Other Recommendations --   CHL IP FOLLOW UP RECOMMENDATIONS 08/22/2021 Follow up  Recommendations (No Data)   CHL IP FREQUENCY AND DURATION 08/22/2021 Speech Therapy Frequency (ACUTE ONLY) min 1 x/week Treatment Duration 1 week      CHL IP ORAL PHASE 08/22/2021 Oral Phase Impaired Oral - Pudding Teaspoon -- Oral - Pudding Cup -- Oral - Honey Teaspoon -- Oral - Honey Cup -- Oral - Nectar Teaspoon -- Oral - Nectar Cup -- Oral - Nectar Straw Premature spillage Oral - Thin Teaspoon -- Oral - Thin Cup Premature spillage Oral - Thin Straw Premature spillage Oral - Puree WFL Oral - Mech Soft WFL Oral - Regular -- Oral - Multi-Consistency -- Oral - Pill Reduced posterior propulsion Oral Phase - Comment --  CHL IP PHARYNGEAL PHASE 08/22/2021 Pharyngeal Phase Impaired Pharyngeal- Pudding Teaspoon -- Pharyngeal -- Pharyngeal- Pudding Cup -- Pharyngeal -- Pharyngeal- Honey Teaspoon -- Pharyngeal -- Pharyngeal- Honey Cup -- Pharyngeal -- Pharyngeal- Nectar Teaspoon -- Pharyngeal -- Pharyngeal- Nectar Cup -- Pharyngeal -- Pharyngeal- Nectar Straw Delayed swallow initiation-vallecula Pharyngeal Material does not enter airway Pharyngeal- Thin Teaspoon -- Pharyngeal -- Pharyngeal- Thin Cup Delayed swallow initiation-pyriform sinuses Pharyngeal Material enters airway, CONTACTS cords and then ejected out Pharyngeal- Thin Straw Delayed swallow initiation-pyriform sinuses Pharyngeal Material enters airway, CONTACTS cords and then ejected out Pharyngeal- Puree WFL Pharyngeal Material does not enter airway Pharyngeal- Mechanical Soft Delayed swallow initiation-vallecula Pharyngeal Material does not enter airway Pharyngeal- Regular -- Pharyngeal -- Pharyngeal- Multi-consistency -- Pharyngeal -- Pharyngeal- Pill Delayed swallow initiation-vallecula Pharyngeal Material does not enter airway Pharyngeal Comment Pt with minimal laryngeal penetration of thin liquids due to minimally decreased laryngeal closure - no aspiration noted; cough noted x2  during testing - suspect some secretion aspiration.  Swallow triggers with liquids  at pyriform sinus as boluses spill to pyriforms prior to swallow trigger. Pharyngeal swallow is strong without retention - with good stripping wave despite edema.  Barium tablet with pudding transited through pharynx into esophagus - but appeared to stall at LES - without pt awareness.  Additional liquid swallow appeared to push tablet into stomach.  Recommend follow aspiration and reflux precautions.  CHL IP CERVICAL ESOPHAGEAL PHASE 08/22/2021 Cervical Esophageal Phase Impaired Pudding Teaspoon -- Pudding Cup -- Honey Teaspoon -- Honey Cup -- Nectar Teaspoon -- Nectar Cup -- Nectar Straw -- Thin Teaspoon -- Thin Cup -- Thin Straw -- Puree -- Mechanical Soft -- Regular -- Multi-consistency -- Pill -- Cervical Esophageal Comment -- Kathleen Lime, MS [facility] SLP Acute Rehab Services Office [phone] Pager [phone] Macario Golds 08/22/2021, 5:03 PM              IR PICC PLACEMENT RIGHT >5 YRS INC IMG GUIDE  Result Date: 08/14/2021 INDICATION: Recent diagnosis of lymphoma. Request made for placement of a PICC line for the initiation of chemotherapy Note, initial plan was to proceed with port a catheter placement however the patient has recently been diagnosed with DVT and pulmonary embolism and referring oncologist has requested a PICC line initially as the patient is begun on anticoagulation. EXAM: ULTRASOUND AND FLUOROSCOPIC GUIDED PICC LINE INSERTION MEDICATIONS: None. CONTRAST:  None FLUOROSCOPY TIME:  18 seconds (2 mGy) COMPLICATIONS: None immediate. TECHNIQUE: The procedure, risks, benefits, and alternatives were explained to the patient's niece and informed written consent was obtained. A timeout was performed prior to the initiation of the procedure. The right upper extremity was prepped with chlorhexidine in a sterile fashion, and a sterile drape was applied covering the operative field. Maximum barrier sterile technique with sterile gowns and gloves were used for the procedure. A timeout was performed  prior to the initiation of the procedure. Local anesthesia was provided with 1% lidocaine. Under direct ultrasound guidance, the brachial vein was accessed with a micropuncture kit after the overlying soft tissues were anesthetized with 1% lidocaine. Real-time ultrasound guidance was utilized for vascular access including the acquisition of a permanent ultrasound image documenting patency of the accessed vessel. A guidewire was advanced to the level of the superior caval-atrial junction for measurement purposes and the PICC line was cut to length. A peel-away sheath was placed and a 40 cm, 5 [place], dual lumen was inserted to level of the superior caval-atrial junction. A post procedure spot fluoroscopic was obtained. The catheter easily aspirated and flushed and was secured in place with stat lock device. A dressing was applied. The patient tolerated the procedure well without immediate post procedural complication. FINDINGS: After catheter placement, the tip lies within the superior cavoatrial junction. The catheter aspirates and flushes normally and is ready for immediate use. IMPRESSION: Successful ultrasound and fluoroscopic guided placement of a right brachial vein approach, 40 cm, 5 French, dual lumen PICC with tip at the superior caval-atrial junction. The PICC line is ready for immediate use. Electronically Signed   By: Sandi Mariscal M.D.   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 Based on limited sampling the patient's pathology reveals a grade 1 through 2 follicular lymphoma with a Ki-67 of 25%.  There was a lot of crush  artifact in the sample and therefore a high-grade process could not be ruled out. However the predominant element of his mass has grown significantly over the last 1 month which is concerning for a high-grade follicular  lymphoma or follicular lymphoma with transformation to large B-cell lymphoma.  CT neck imaging shows concern with compression of left internal jugular vein with mild mass-effect on the airway in the neck and the left carotid is inseparable from the mass.  High risk of syncopal episodes if there is compression of the common carotid baroreceptors and left vagus nerve  PET/CT 08/10/2021 showed  1. Examination is positive for large FDG avid mass within the left neck extending from the level 2 nodal chain into the left side of the superior mediastinum compatible with metabolically active tumor. 2. There are 2 foci of increased uptake within the neck and body of pancreas lymphomatous involvement of the pancreas. 3. Subcentimeter FDG avid right inguinal lymph node also concerning for lymphoma. 4. Several foci of increased uptake within the axial skeleton compatible with malignancy  2) HTN  3) Obstructive sleep apnea not on CPAP machine.  This is causing sleep issues and possible cognitive issues.  4) Depression and anxiety  5) Acute pulmonary embolism due to lymphoma plus body habitus plus possible vascular compression - on lovenox  6) PICC line in situ PLAN -PET CT scan reviewed with the patient showing  1. Examination is positive for large FDG avid mass within the left neck extending from the level 2 nodal chain into the left side of the superior mediastinum compatible with metabolically active tumor. 2. There are 2 foci of increased uptake within the neck and body of pancreas lymphomatous involvement of the pancreas. 3. Subcentimeter FDG avid right inguinal lymph node also concerning for lymphoma. 4. Several foci of increased uptake within the axial skeleton compatible with malignancy -Labs on discharge from 08/25/2021 reviewed with the patient. -Patient with normal regular toxicities from his fourth cycle of R-CHOP which was given as inpatient.  Mild leukopenia/neutropenia due to chemotherapy has  received G-CSF. -We will continue Lovenox for his PE for 1 to 2 months and then will transition to Eliquis. -We will plan to replace his PICC line with a Port-A-Cath after about 2 months of anticoagulation when his Lovenox can be held for his Port-A-Cath placement. -Recommend flushes of care at home. -Discussed importance of staying well-hydrated -Follow-up with pulmonary for sleep study.  Patient has been on home oxygen while sleeping since hospital discharge.  Follow-up  Follow-up Follow-up for cycle 2 of outpatient R-CHOP with a flush and labs and MD visit as per orders.   All of the patients questions were answered with apparent satisfaction. The patient knows to call the clinic with any problems, questions or concerns.  . The total time spent in the appointment was 30 minutes and more than 50% was on counseling and direct patient cares, monitoring and management of chemoimmunotherapy and coordination of care.    Sullivan Lone MD MS AAHIVMS [facility] Hematology/Oncology Physician [facility]   .

## 2021-09-07 NOTE — Progress Notes (Signed)
Nutrition Assessment   Reason for Assessment: MST (+wt loss)   ASSESSMENT: 65 year old male with grade 3 follicular lymphoma. He is receiving R-CHOP. Patient followed by Dr. Irene Limbo.  Past medical history includes HTN, PE, OSA, morbid obesity, HLD, recurrent syncope  Met with patient during infusion. He reports tolerating treatment well. Patient denies nausea, vomiting, diarrhea, constipation. He is having altered taste, reports foods are bland. Patient is staying with his sister, reports eating 3 meals/day and drinking 2 Boost. He is eating a variety of soft foods (oatmeal, applesauce, toast, hot chocolate for breakfast this morning, sandwich with chips for lunch, salmon cakes, baked beans, mac/cheese, creamed potatoes, corn for supper last night). Patient is drinking water frequently throughout the day.   Nutrition Focused Physical Exam: deferred   Medications: D2, zofran, compazine, zoloft, zocar, lovenox   Labs: Glucose 111   Anthropometrics: Weights have decreased 22 lbs (6.5%) from 339 lb 8.1 oz on 9/19. This is significant for time frame.  Height: 5'9" Weight: 317 lb 8 oz (11/18) UBW: 350 lb (per pt) BMI: 46.89   NUTRITION DIAGNOSIS: Unintentional weight loss related to cancer and associated treatments as evidenced by 9.4% (33 lb) decrease from usual weight over the past year.    INTERVENTION:  Educated on importance of adequate calories and protein energy intake to maintain strength, weights, nutrition  Encouraged snacks in between meals with focus on protein - handout with snack ideas provided Continue drinking 2 Boost Plus/equivalent daily - coupons provided  Discussed strategies for altered taste - handout with tips provided Contact information given    MONITORING, EVALUATION, GOAL: Patient will tolerate increased calories and protein to minimize weight loss    Next Visit: To be scheduled with treatment

## 2021-09-10 ENCOUNTER — Other Ambulatory Visit: Payer: Self-pay

## 2021-09-10 ENCOUNTER — Inpatient Hospital Stay: Payer: BC Managed Care – PPO

## 2021-09-10 VITALS — BP 146/87 | HR 101 | Temp 98.6°F | Resp 20

## 2021-09-10 DIAGNOSIS — C8211 Follicular lymphoma grade II, lymph nodes of head, face, and neck: Secondary | ICD-10-CM | POA: Diagnosis not present

## 2021-09-10 DIAGNOSIS — Z5111 Encounter for antineoplastic chemotherapy: Secondary | ICD-10-CM | POA: Diagnosis not present

## 2021-09-10 DIAGNOSIS — C8221 Follicular lymphoma grade III, unspecified, lymph nodes of head, face, and neck: Secondary | ICD-10-CM

## 2021-09-10 DIAGNOSIS — Z86711 Personal history of pulmonary embolism: Secondary | ICD-10-CM | POA: Diagnosis not present

## 2021-09-10 DIAGNOSIS — F419 Anxiety disorder, unspecified: Secondary | ICD-10-CM | POA: Diagnosis not present

## 2021-09-10 DIAGNOSIS — Z7189 Other specified counseling: Secondary | ICD-10-CM

## 2021-09-10 DIAGNOSIS — Z79899 Other long term (current) drug therapy: Secondary | ICD-10-CM | POA: Diagnosis not present

## 2021-09-10 DIAGNOSIS — I10 Essential (primary) hypertension: Secondary | ICD-10-CM | POA: Diagnosis not present

## 2021-09-10 DIAGNOSIS — Z7901 Long term (current) use of anticoagulants: Secondary | ICD-10-CM | POA: Diagnosis not present

## 2021-09-10 DIAGNOSIS — F32A Depression, unspecified: Secondary | ICD-10-CM | POA: Diagnosis not present

## 2021-09-10 DIAGNOSIS — G4733 Obstructive sleep apnea (adult) (pediatric): Secondary | ICD-10-CM | POA: Diagnosis not present

## 2021-09-10 MED ORDER — PEGFILGRASTIM-CBQV 6 MG/0.6ML ~~LOC~~ SOSY
6.0000 mg | PREFILLED_SYRINGE | Freq: Once | SUBCUTANEOUS | Status: AC
  Administered 2021-09-10: 6 mg via SUBCUTANEOUS
  Filled 2021-09-10: qty 0.6

## 2021-09-10 NOTE — Patient Instructions (Signed)

## 2021-09-14 ENCOUNTER — Encounter: Payer: Self-pay | Admitting: Hematology

## 2021-09-14 ENCOUNTER — Other Ambulatory Visit: Payer: Self-pay

## 2021-09-14 ENCOUNTER — Inpatient Hospital Stay: Payer: BC Managed Care – PPO

## 2021-09-14 DIAGNOSIS — Z5111 Encounter for antineoplastic chemotherapy: Secondary | ICD-10-CM | POA: Diagnosis not present

## 2021-09-14 DIAGNOSIS — Z452 Encounter for adjustment and management of vascular access device: Secondary | ICD-10-CM

## 2021-09-14 DIAGNOSIS — Z7901 Long term (current) use of anticoagulants: Secondary | ICD-10-CM | POA: Diagnosis not present

## 2021-09-14 DIAGNOSIS — Z86711 Personal history of pulmonary embolism: Secondary | ICD-10-CM | POA: Diagnosis not present

## 2021-09-14 DIAGNOSIS — I10 Essential (primary) hypertension: Secondary | ICD-10-CM | POA: Diagnosis not present

## 2021-09-14 DIAGNOSIS — Z79899 Other long term (current) drug therapy: Secondary | ICD-10-CM | POA: Diagnosis not present

## 2021-09-14 DIAGNOSIS — F32A Depression, unspecified: Secondary | ICD-10-CM | POA: Diagnosis not present

## 2021-09-14 DIAGNOSIS — C8211 Follicular lymphoma grade II, lymph nodes of head, face, and neck: Secondary | ICD-10-CM | POA: Diagnosis not present

## 2021-09-14 DIAGNOSIS — F419 Anxiety disorder, unspecified: Secondary | ICD-10-CM | POA: Diagnosis not present

## 2021-09-14 DIAGNOSIS — G4733 Obstructive sleep apnea (adult) (pediatric): Secondary | ICD-10-CM | POA: Diagnosis not present

## 2021-09-14 MED ORDER — SODIUM CHLORIDE 0.9% FLUSH
10.0000 mL | Freq: Once | INTRAVENOUS | Status: AC
  Administered 2021-09-14: 10 mL

## 2021-09-14 MED ORDER — HEPARIN SOD (PORK) LOCK FLUSH 100 UNIT/ML IV SOLN
500.0000 [IU] | Freq: Once | INTRAVENOUS | Status: AC
  Administered 2021-09-14: 500 [IU]

## 2021-09-17 NOTE — Progress Notes (Signed)
[name]   HEMATOLOGY/ONCOLOGY CLINIC NOTE  Date of Service: .09/07/2021   Patient Care Team: Celene Squibb, MD as PCP - General (Internal Medicine)  CHIEF COMPLAINTS/PURPOSE OF CONSULTATION:  Left neck mass/newly diagnosed non-Hodgkin's lymphoma.  HISTORY OF PRESENTING ILLNESS:  Joe [name] is a wonderful 65 y.o. male who has been referred to [place] by Dr .[place] Crane, Edwinna Areola, MD for evaluation and management of newly diagnosed non-Hodgkin's lymphoma.  Patient has a history of morbid obesity (.Body mass index is 46.89 kg/m.),  Sleep apnea not currently on CPAP ,arthritis, anxiety, hypertension, depression and first presented to the emergency room on 07/09/2021 with newly noted left-sided neck mass which showed up over a week.  He initially went to urgent care and was treated with Augmentin for possible infectious etiology but came to the emergency room when his left neck mass continued to get larger and more uncomfortable.  He notes some difficulty with breathing also at times feels like it is a little more difficult to swallow. Patient had a CT soft tissue of the neck with contrast on 07/09/2021 which showed 1. Large heterogeneous soft tissue mass measuring 7.9 x 6.1 by 8.2 cm in the left neck, extending from the skull base/parapharyngeal space inferiorly to the supraclavicular fossa, located immediately subjacent to and inseparable from the left sternocleidomastoid. The left internal jugular vein is obliterated by the mass and left carotid is inseparable from the mass, poorly visualized. Findings could represent malignancy or infectious phlegmon. Soft tissue sampling could provide more definitive diagnosis. 2. Nonspecific mild surrounding stranding and scattered prominent lymph nodes throughout the neck.  Patient was referred to ENT and had a surgical biopsy of the left neck mass by Dr. Melida Quitter on 07/14/2021 which showed Non-Hodgkin B-cell lymphoma, The majority of the fragments have extensive crush  artifact hampering  evaluation. One fragment is well preserved and demonstrates an atypical  lymphoid proliferation with predominately small irregular lymphocytes.  Immunohistochemistry is positive for CD20, bcl-2, bcl-6 and CD10. CD23  is largely negative. CyclinD1 and light chain in situ hybridization are  negative. Ki-67 is 20-25%. CD3 and CD5 highlight scattered T-cells. Flow  cytometry ([id number]) reveals predominately T-cells. Overall, the  findings are consistent with a non-Hodgkin B-cell lymphoma, specifically  follicular lymphoma. There is only limited preserved tissue hampering grading, but this area appears low grade (grade 1-2 of 3). A higher grade process in the crushed fragments cannot be excluded.  Patient again presented to the emergency room on 07/28/2021 with an episode of shaking reportedly some confusion to rule out seizure.  He had a CT of the head which showed no acute intracranial process.  Patient is here to discuss further evaluation and management of his newly diagnosed follicular lymphoma.  He notes some increased fatigue but no overt fevers chills night sweats or unexpected sudden weight loss. No other overt new focal symptoms other than in the neck. No new chest pain or shortness of breath. No abdominal pain, abdominal distention or change in bowel habits.  Patient notes no previous known heart issues. Has had COVID-19 in 2020 and 2022 denies any chronic lung issues. Does appear to have significant sleep apnea based on symptoms and known history of previous sleep apnea but has never been on CPAP.  His episodes of confusion difficulty waking up and difficulty waking up from sedation there is certainly a strong concern for untreated sleep apnea.  Patient and his wife notes that he has been scheduled for a sleep study.  INTERVAL  HISTORY  Patient is here for follow-up regarding his recently diagnosed high-grade follicular lymphoma and prior to cycle 2 of R-CHOP  chemotherapy. He notes his left neck mass has significantly continued to shrink and he has had no further presyncopal or syncopal episodes or confusion. Patient notes no fevers no chills no night sweats. No other acute toxicities after cycle 1 of R-CHOP. No infection issues. Labs 09/07/2021 were reviewed.  CBC unremarkable platelets 429k CMP within normal limits, AST 24 ALT 60 down from 161. LDH down to 247 from 382.  MEDICAL HISTORY:  Past Medical History:  Diagnosis Date   Anxiety    Arthritis    Cancer ([place])    COVID    x 2 (2020 and 2022)   Depression    Hypertension     SURGICAL HISTORY: Past Surgical History:  Procedure Laterality Date   carpel tunnel     MASS BIOPSY Left 07/14/2021   Procedure: NECK MASS BIOPSY;  Surgeon: Melida Quitter, MD;  Location: [facility] OR;  Service: ENT;  Laterality: Left;    SOCIAL HISTORY: Social History   Socioeconomic History   Marital status: Widowed    Spouse name: Not on file   Number of children: Not on file   Years of education: Not on file   Highest education level: Not on file  Occupational History   Not on file  Tobacco Use   Smoking status: Former    Types: Cigarettes   Smokeless tobacco: Former    Types: Chew  Substance and Sexual Activity   Alcohol use: Yes    Comment: rare   Drug use: No   Sexual activity: Not on file  Other Topics Concern   Not on file  Social History Narrative   Not on file   Social Determinants of Health   Financial Resource Strain: Not on file  Food Insecurity: Not on file  Transportation Needs: Not on file  Physical Activity: Not on file  Stress: Not on file  Social Connections: Not on file  Intimate Partner Violence: Not on file    FAMILY HISTORY: Family History  Problem Relation Age of Onset   Healthy Mother    Healthy Father     ALLERGIES:  has No Known Allergies.  MEDICATIONS:  Current Outpatient Medications  Medication Sig Dispense Refill   acetaminophen (TYLENOL) 325 MG  tablet Take 2 tablets (650 mg total) by mouth every 6 (six) hours as needed for mild pain (or Fever >/= 101). 20 tablet 0   allopurinol (ZYLOPRIM) 300 MG tablet Take 300 mg by mouth daily.     amLODipine (NORVASC) 10 MG tablet Take 10 mg by mouth daily.     enoxaparin (LOVENOX) 120 MG/0.8ML injection Inject 0.8 mLs (120 mg total) into the skin every 12 (twelve) hours. 48 mL 0   ergocalciferol (VITAMIN D2) 1.25 MG (50000 UT) capsule Take 1 capsule (50,000 Units total) by mouth once a week. (Patient taking differently: Take 50,000 Units by mouth once a week. on Wednesday) 12 capsule 0   feeding supplement (ENSURE ENLIVE / ENSURE PLUS) LIQD Take 237 mLs by mouth 2 (two) times daily between meals. 237 mL 12   ondansetron (ZOFRAN) 8 MG tablet Take 1 tablet (8 mg total) by mouth 2 (two) times daily as needed for refractory nausea / vomiting. Start on day 3 after cyclophosphamide chemotherapy. 30 tablet 1   predniSONE (DELTASONE) 20 MG tablet Take 3 tablets (60 mg total) by mouth daily. Take with food on days  2-6 of chemotherapy. 25 tablet 5   prochlorperazine (COMPAZINE) 10 MG tablet Take 1 tablet (10 mg total) by mouth every 6 (six) hours as needed (Nausea or vomiting). 30 tablet 6   sertraline (ZOLOFT) 50 MG tablet Take 50 mg by mouth daily.     simvastatin (ZOCOR) 40 MG tablet Take 40 mg by mouth 3 (three) times a week. (Patient not taking: Reported on 08/16/2021)     sodium chloride flush (NS) 0.9 % SOLN 10-40 mLs by Intracatheter route as needed (flush). 1000 mL 0   sodium chloride flush 0.9 % SOLN injection Inject 10-60ms as needed for intracatheter flush. 1000 mL 0   Sodium Chloride-Sodium Bicarb (SODIUM BICARBONATE/SODIUM CHLORIDE) SOLN 1 application by Mouth Rinse route as needed for dry mouth. 1000 mL 0   No current facility-administered medications for this visit.    REVIEW OF SYSTEMS:   .10 Point review of Systems was done is negative except as noted above.  PHYSICAL EXAMINATION: ECOG  PERFORMANCE STATUS: 1 - Symptomatic but completely ambulatory  . Vitals:   09/07/21 1042  BP: (!) 154/89  Pulse: 98  Resp: 18  Temp: 98.5 F (36.9 C)  SpO2: 100%   Filed Weights   09/07/21 1042  Weight: (!) 317 lb 8 oz (144 kg)   .Body mass index is 46.89 kg/m.  GENERAL:alert, in no acute distress and comfortable SKIN: no acute rashes, no significant lesions EYES: conjunctiva are pink and non-injected, sclera anicteric OROPHARYNX: MMM, no exudates, no oropharyngeal erythema or ulceration NECK: supple, no JVD LYMPH: Left cervical neck mass he is decreased by nearly 60% ,no palpable lymphadenopathy in the axillary or inguinal regions LUNGS: clear to auscultation b/l with normal respiratory effort HEART: regular rate & rhythm ABDOMEN:  normoactive bowel sounds , non tender, not distended. Extremity: no pedal edema PSYCH: alert & oriented x 3 with fluent speech NEURO: no focal motor/sensory deficits    LABORATORY DATA:  I have reviewed the data as listed . CBC Latest Ref Rng & Units 09/07/2021 08/25/2021 08/24/2021  WBC 4.0 - 10.5 K/uL 8.1 1.8(L) 9.4  Hemoglobin 13.0 - 17.0 g/dL 13.0 12.5(L) 12.6(L)  Hematocrit 39.0 - 52.0 % 38.4(L) 38.4(L) 38.4(L)  Platelets 150 - 400 K/uL 429(H) 129(L) 148(L)    . CMP Latest Ref Rng & Units 09/07/2021 08/25/2021 08/24/2021  Glucose 70 - 99 mg/dL 111(H) 97 86  BUN 8 - 23 mg/dL '11 19 21  ' Creatinine 0.61 - 1.24 mg/dL 1.06 0.80 0.93  Sodium 135 - 145 mmol/L 137 137 136  Potassium 3.5 - 5.1 mmol/L 4.4 3.8 4.0  Chloride 98 - 111 mmol/L 102 97(L) 95(L)  CO2 22 - 32 mmol/L 26 32 35(H)  Calcium 8.9 - 10.3 mg/dL 9.1 8.5(L) 8.4(L)  Total Protein 6.5 - 8.1 g/dL 6.9 6.1(L) 6.2(L)  Total Bilirubin 0.3 - 1.2 mg/dL 0.3 0.5 0.9  Alkaline Phos 38 - 126 U/L 69 77 88  AST 15 - 41 U/L 24 37 47(H)  ALT 0 - 44 U/L 60(H) 154(H) 161(H)     SURGICAL PATHOLOGY  CASE: [id number]  PATIENT: Joe [name]  Surgical Pathology Report   Clinical History:  neck mass (cm)      FINAL MICROSCOPIC DIAGNOSIS:   A. SOFT TISSUE MASS, LEFT NECK, BIOPSY:  - Non-Hodgkin B-cell lymphoma, see comment.   COMMENT:   The majority of the fragments have extensive crush artifact hampering  evaluation. One fragment is well preserved and demonstrates an atypical  lymphoid proliferation with predominately small  irregular lymphocytes.  Immunohistochemistry is positive for CD20, bcl-2, bcl-6 and CD10. CD23  is largely negative. CyclinD1 and light chain in situ hybridization are  negative. Ki-67 is 20-25%. CD3 and CD5 highlight scattered T-cells. Flow  cytometry ([id number]) reveals predominately T-cells. Overall, the  findings are consistent with a non-Hodgkin B-cell lymphoma, specifically  follicular lymphoma. There is only limited preserved tissue hampering  grading, but this area appears low grade (grade 1-2 of 3). A higher  grade process in the crushed fragments cannot be excluded. Clinical  correlation is recommended. Dr. Redmond Baseman was attempted on 07/18/2021.   RADIOGRAPHIC STUDIES: I have personally reviewed the radiological images as listed and agreed with the findings in the report. DG Swallowing Func-Speech Pathology  Result Date: 08/22/2021 Table formatting from the original result was not included. Objective Swallowing Evaluation: Type of Study: MBS-Modified Barium Swallow Study  Patient Details Name: Joe [name] MRN: [id number] Date of Birth: Aug 13, 1956 Today's Date: 08/22/2021 Time: SLP Start Time (ACUTE ONLY): 1510 -SLP Stop Time (ACUTE ONLY): 1527 SLP Time Calculation (min) (ACUTE ONLY): 17 min Past Medical History: Past Medical History: Diagnosis Date  Anxiety   Arthritis   Cancer ([place])   COVID   x 2 (2020 and 2022)  Depression   Hypertension  Past Surgical History: Past Surgical History: Procedure Laterality Date  carpel tunnel    MASS BIOPSY Left 07/14/2021  Procedure: NECK MASS BIOPSY;  Surgeon: Melida Quitter, MD;  Location: [place];  Service: ENT;   Laterality: Left; HPI: Joe [name] is a 65 y.o. 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 Most of the history was obtained from niece at bedside, per report, patient has had about 4 episodes of syncope within last week.  First episode lasted several hours, he was taken to a hospital in [place], he was evaluated for syncope and noted to have elevated CO2 level per ED medical record (10/8).  Patient has since had 3 other episodes, lasted few seconds to a minute with return to baseline within few minutes and without any postictal state. EMS was activated, BP was checked and was hypotensive at 85 and this was associated with diaphoresis. In the ED, noted to be HD stable. Work-up in the ED showed elevated creatinine at 1.41 (baseline creatinine at 1.1-1.3), D-dimer 3.59.  Influenza A, B, SARS coronavirus 2 was negative. CTA chest showed PE. No findings of right heart strain or pulmonary infarction. Patient was started on heparin drip. Pt reports poor oral intake due to mass in neck and fear of choking.  Pt was eating chopped food prior to admit.  He reports decreased edema due to chemotherapy with improvement. Pt on a puree/thin diet - hoping for diet advancement.  Subjective: pt awake in wheelchair, ready for swallow evaluation Assessment / Plan / Recommendation CHL IP CLINICAL IMPRESSIONS 08/22/2021 Clinical Impression Pt presents with min oropharyngeal dysphagia mostly c/b decreased oral coordination with premature spillage of liquids into pharynx to pyriform sinus with thin.  Pharyngeal swallow is strong without retention with good stripping wave despite edema.  Minimally decreased laryngeal closure (suspect due to edema) allows minimal laryngeal penetration of thin liquids. No aspiration  observed during the study despite pt being tested with sequential swallows.  Pt did cough x2 - once due to trace  oral retention of liquids and secretions spilling into larynx post-swallow.  Pt did not transit tablet with thin liquids despite 2 trials, but easily transited with pudding.  Tablet appeared to transiently halt at LES - without pt awareness.  Additional liquid swallow faciliated tablet transiting into his stomach.  Recommend to advance diet to dys3/thin - medicine with puree.  SLP will follow up with pt for education/po tolerance/compensation strategy effectiveness.  Recommend follow aspiration and reflux precautions. Oral care important due to suspected secretion aspiration and pt's chemo treatment impact on immune system increasing risk of pna if he aspirate. SLP Visit Diagnosis Dysphagia, oropharyngeal phase (R13.12) Attention and concentration deficit following -- Frontal lobe and executive function deficit following -- Impact on safety and function Mild aspiration risk   CHL IP TREATMENT RECOMMENDATION 08/22/2021 Treatment Recommendations Therapy as outlined in treatment plan below   Prognosis 08/22/2021 Prognosis for Safe Diet Advancement Good Barriers to Reach Goals -- Barriers/Prognosis Comment -- CHL IP DIET RECOMMENDATION 08/22/2021 SLP Diet Recommendations Dysphagia 3 (Mech soft) solids;Thin liquid Liquid Administration via Cup;Straw Medication Administration Whole meds with puree Compensations Slow rate;Small sips/bites Postural Changes Remain semi-upright after after feeds/meals (Comment);Seated upright at 90 degrees   CHL IP OTHER RECOMMENDATIONS 08/22/2021 Recommended Consults -- Oral Care Recommendations Oral care BID Other Recommendations --   CHL IP FOLLOW UP RECOMMENDATIONS 08/22/2021 Follow up Recommendations (No Data)   CHL IP FREQUENCY AND DURATION 08/22/2021 Speech Therapy Frequency (ACUTE ONLY) min 1 x/week Treatment Duration 1 week      CHL IP ORAL PHASE 08/22/2021 Oral Phase Impaired  Oral - Pudding Teaspoon -- Oral - Pudding Cup -- Oral - Honey Teaspoon -- Oral - Honey Cup -- Oral - Nectar Teaspoon -- Oral - Nectar Cup -- Oral - Nectar Straw Premature spillage Oral - Thin Teaspoon -- Oral - Thin Cup Premature spillage Oral - Thin Straw Premature spillage Oral - Puree WFL Oral - Mech Soft WFL Oral - Regular -- Oral - Multi-Consistency -- Oral - Pill Reduced posterior propulsion Oral Phase - Comment --  CHL IP PHARYNGEAL PHASE 08/22/2021 Pharyngeal Phase Impaired Pharyngeal- Pudding Teaspoon -- Pharyngeal -- Pharyngeal- Pudding Cup -- Pharyngeal -- Pharyngeal- Honey Teaspoon -- Pharyngeal -- Pharyngeal- Honey Cup -- Pharyngeal -- Pharyngeal- Nectar Teaspoon -- Pharyngeal -- Pharyngeal- Nectar Cup -- Pharyngeal -- Pharyngeal- Nectar Straw Delayed swallow initiation-vallecula Pharyngeal Material does not enter airway Pharyngeal- Thin Teaspoon -- Pharyngeal -- Pharyngeal- Thin Cup Delayed swallow initiation-pyriform sinuses Pharyngeal Material enters airway, CONTACTS cords and then ejected out Pharyngeal- Thin Straw Delayed swallow initiation-pyriform sinuses Pharyngeal Material enters airway, CONTACTS cords and then ejected out Pharyngeal- Puree WFL Pharyngeal Material does not enter airway Pharyngeal- Mechanical Soft Delayed swallow initiation-vallecula Pharyngeal Material does not enter airway Pharyngeal- Regular -- Pharyngeal -- Pharyngeal- Multi-consistency -- Pharyngeal -- Pharyngeal- Pill Delayed swallow initiation-vallecula Pharyngeal Material does not enter airway Pharyngeal Comment Pt with minimal laryngeal penetration of thin liquids due to minimally decreased laryngeal closure - no aspiration noted; cough noted x2 during testing - suspect some secretion aspiration.  Swallow triggers with liquids at pyriform sinus as boluses spill to pyriforms prior to swallow trigger. Pharyngeal swallow is strong without retention - with good stripping wave despite edema.  Barium tablet with pudding  transited through pharynx into esophagus - but appeared to stall at LES - without pt awareness.  Additional liquid swallow appeared to push tablet into stomach.  Recommend follow aspiration and reflux precautions.  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 Based on limited sampling the patient's pathology reveals a grade 1 through 2 follicular lymphoma with a Ki-67 of 25%.  There was a lot of crush artifact in the sample and therefore a high-grade process could not be ruled out. However the predominant element of his mass has grown significantly over the last 1 month which is concerning for a high-grade follicular lymphoma or follicular lymphoma with transformation to large B-cell lymphoma.  CT neck imaging shows concern with compression of left internal jugular vein with mild mass-effect on the airway in the neck and the left carotid is inseparable from the mass.  High risk of syncopal episodes if there is compression of the common carotid baroreceptors and left vagus nerve  PET/CT 08/10/2021 showed  1. Examination is positive for large FDG avid mass within the left neck extending from the level 2 nodal chain into the left side of the superior mediastinum compatible with metabolically active tumor. 2. There are 2 foci of increased uptake within the neck and body of pancreas lymphomatous involvement of the  pancreas. 3. Subcentimeter FDG avid right inguinal lymph node also concerning for lymphoma. 4. Several foci of increased uptake within the axial skeleton compatible with malignancy  2) HTN  3) Obstructive sleep apnea not on CPAP machine.  This is causing sleep issues and possible cognitive issues.  4) Depression and anxiety  5) Acute pulmonary embolism due to lymphoma plus body habitus plus possible vascular compression - on lovenox  6) PICC line in situ PLAN -Labs 09/07/2021 were reviewed.  CBC unremarkable platelets 429k CMP within normal limits, AST 24 ALT 60 down from 161. LDH down to 247 from 382. --Patient with normal regular toxicities from his first cycle of R-CHOP which was given as inpatient.  He is stable and appropriate to proceed with cycle 2 of R-CHOP. -We will continue Lovenox for his PE for 1 to 2 months and then will transition to Eliquis. -We will plan to replace his PICC line with a Port-A-Cath after about 2 months of anticoagulation when his Lovenox can be held for his Port-A-Cath placement. -Recommend flushes of care at home. -Discussed importance of staying well-hydrated -Follow-up with pulmonary for sleep study.  Patient has been on home oxygen while sleeping since hospital discharge.  Follow-up  Follow-up The schedule cycle 3 and cycle 4 of R-CHOP with PICC flush, labs and MD visits   All of the patients questions were answered with apparent satisfaction. The patient knows to call the clinic with any problems, questions or concerns.  . The total time spent in the appointment was 30 minutes and more than 50% was on counseling and direct patient cares, monitoring and management of chemoimmunotherapy and coordination of care.    Sullivan Lone MD MS AAHIVMS [facility] Hematology/Oncology Physician [facility]   .

## 2021-09-19 NOTE — Procedures (Signed)
    Patient Name: Joe [name], Joe [name] Date: 09/08/2021 Gender: Male D.O.B: January 10, 1956 Age (years): 65 Referring Provider: Maryjane Hurter Height (inches): 34 Interpreting Physician: Chesley Mires MD, ABSM Weight (lbs): 317 RPSGT: Rosebud Poles BMI: 62 MRN: [id number]  CLINICAL INFORMATION Sleep Study Type: HST  Indication for sleep study: Snoring, sleep disruption, and daytime sleepiness.  SLEEP STUDY TECHNIQUE A multi-channel overnight portable sleep study was performed. The channels recorded were: nasal airflow, thoracic respiratory movement, and oxygen saturation with a pulse oximetry. Snoring was also monitored.  MEDICATIONS Patient self administered medications include: N/A.  SLEEP ARCHITECTURE Patient was studied for 548.7 minutes. The sleep efficiency was 95.3 % and the patient was supine for 75.4%. The arousal index was 0.0 per hour.  RESPIRATORY PARAMETERS The overall AHI was 68.5 per hour, with a central apnea index of 0 per hour.  The oxygen nadir was 39% during sleep.  CARDIAC DATA Mean heart rate during sleep was 78.1 bpm.  IMPRESSIONS - Severe obstructive sleep apnea occurred during this study (AHI = 68.5/h). - Severe oxygen desaturation was noted during this study (Min O2 = 39%). - Patient snored 23.1% during the sleep.  DIAGNOSIS - Obstructive Sleep Apnea (G47.33) - Nocturnal Hypoxemia (G47.36)  RECOMMENDATIONS - He should be started on auto CPAP 5 to 20 cm H2O first to expedite therapy.  He should then be set up for an in lab titration study to determine if he needs Bipap +/- supplemental oxygen at night. - Avoid alcohol, sedatives and other CNS depressants that may worsen sleep apnea and disrupt normal sleep architecture. - Sleep hygiene should be reviewed to assess factors that may improve sleep quality. - Weight management and regular exercise should be initiated or continued.  [Electronically signed] 09/19/2021 08:55 AM  Chesley Mires MD,  ABSM Diplomate, American Board of Sleep Medicine   NPI: [id number]  [place] PH: [phone]   FX: [phone] [place]

## 2021-09-21 ENCOUNTER — Other Ambulatory Visit: Payer: Self-pay

## 2021-09-21 ENCOUNTER — Inpatient Hospital Stay: Payer: BC Managed Care – PPO | Attending: Hematology

## 2021-09-21 DIAGNOSIS — Z79899 Other long term (current) drug therapy: Secondary | ICD-10-CM | POA: Insufficient documentation

## 2021-09-21 DIAGNOSIS — C8221 Follicular lymphoma grade III, unspecified, lymph nodes of head, face, and neck: Secondary | ICD-10-CM

## 2021-09-21 DIAGNOSIS — C8211 Follicular lymphoma grade II, lymph nodes of head, face, and neck: Secondary | ICD-10-CM | POA: Diagnosis not present

## 2021-09-21 DIAGNOSIS — Z452 Encounter for adjustment and management of vascular access device: Secondary | ICD-10-CM

## 2021-09-21 DIAGNOSIS — Z5111 Encounter for antineoplastic chemotherapy: Secondary | ICD-10-CM | POA: Insufficient documentation

## 2021-09-21 MED ORDER — HEPARIN SOD (PORK) LOCK FLUSH 100 UNIT/ML IV SOLN
250.0000 [IU] | Freq: Once | INTRAVENOUS | Status: AC
  Administered 2021-09-21: 250 [IU]

## 2021-09-21 MED ORDER — ENOXAPARIN SODIUM 120 MG/0.8ML IJ SOSY: 120.0000 mg | PREFILLED_SYRINGE | Freq: Two times a day (BID) | INTRAMUSCULAR | 0 refills | Status: DC

## 2021-09-21 MED ORDER — SODIUM CHLORIDE 0.9% FLUSH
10.0000 mL | Freq: Once | INTRAVENOUS | Status: AC
  Administered 2021-09-21: 10 mL

## 2021-09-21 MED ORDER — HEPARIN SOD (PORK) LOCK FLUSH 100 UNIT/ML IV SOLN
250.0000 [IU] | Freq: Once | INTRAVENOUS | Status: AC
Start: 2021-09-21 — End: 2021-09-21
  Administered 2021-09-21: 250 [IU]

## 2021-09-24 ENCOUNTER — Other Ambulatory Visit: Payer: Self-pay | Admitting: Hematology

## 2021-09-25 DIAGNOSIS — J9621 Acute and chronic respiratory failure with hypoxia: Secondary | ICD-10-CM | POA: Diagnosis not present

## 2021-09-25 DIAGNOSIS — J9612 Chronic respiratory failure with hypercapnia: Secondary | ICD-10-CM | POA: Diagnosis not present

## 2021-09-27 ENCOUNTER — Other Ambulatory Visit: Payer: Self-pay

## 2021-09-27 ENCOUNTER — Inpatient Hospital Stay (HOSPITAL_BASED_OUTPATIENT_CLINIC_OR_DEPARTMENT_OTHER): Payer: BC Managed Care – PPO | Admitting: Hematology

## 2021-09-27 ENCOUNTER — Inpatient Hospital Stay: Payer: BC Managed Care – PPO

## 2021-09-27 VITALS — BP 140/93 | HR 109 | Temp 97.8°F | Resp 20 | Wt 321.1 lb

## 2021-09-27 DIAGNOSIS — Z452 Encounter for adjustment and management of vascular access device: Secondary | ICD-10-CM

## 2021-09-27 DIAGNOSIS — Z5111 Encounter for antineoplastic chemotherapy: Secondary | ICD-10-CM

## 2021-09-27 DIAGNOSIS — C8221 Follicular lymphoma grade III, unspecified, lymph nodes of head, face, and neck: Secondary | ICD-10-CM

## 2021-09-27 DIAGNOSIS — C8211 Follicular lymphoma grade II, lymph nodes of head, face, and neck: Secondary | ICD-10-CM | POA: Diagnosis not present

## 2021-09-27 DIAGNOSIS — Z7189 Other specified counseling: Secondary | ICD-10-CM

## 2021-09-27 DIAGNOSIS — Z79899 Other long term (current) drug therapy: Secondary | ICD-10-CM | POA: Diagnosis not present

## 2021-09-27 LAB — CBC WITH DIFFERENTIAL (CANCER CENTER ONLY)
Abs Immature Granulocytes: 0.09 10*3/uL — ABNORMAL HIGH (ref 0.00–0.07)
Basophils Absolute: 0.2 10*3/uL — ABNORMAL HIGH (ref 0.0–0.1)
Basophils Relative: 2 %
Eosinophils Absolute: 0.2 10*3/uL (ref 0.0–0.5)
Eosinophils Relative: 2 %
HCT: 36.7 % — ABNORMAL LOW (ref 39.0–52.0)
Hemoglobin: 12.2 g/dL — ABNORMAL LOW (ref 13.0–17.0)
Immature Granulocytes: 1 %
Lymphocytes Relative: 16 %
Lymphs Abs: 1.3 10*3/uL (ref 0.7–4.0)
MCH: 30.2 pg (ref 26.0–34.0)
MCHC: 33.2 g/dL (ref 30.0–36.0)
MCV: 90.8 fL (ref 80.0–100.0)
Monocytes Absolute: 1 10*3/uL (ref 0.1–1.0)
Monocytes Relative: 13 %
Neutro Abs: 5 10*3/uL (ref 1.7–7.7)
Neutrophils Relative %: 66 %
Platelet Count: 477 10*3/uL — ABNORMAL HIGH (ref 150–400)
RBC: 4.04 MIL/uL — ABNORMAL LOW (ref 4.22–5.81)
RDW: 16.4 % — ABNORMAL HIGH (ref 11.5–15.5)
WBC Count: 7.7 10*3/uL (ref 4.0–10.5)
nRBC: 0.8 % — ABNORMAL HIGH (ref 0.0–0.2)

## 2021-09-27 LAB — CMP (CANCER CENTER ONLY)
ALT: 32 U/L (ref 0–44)
AST: 20 U/L (ref 15–41)
Albumin: 3.7 g/dL (ref 3.5–5.0)
Alkaline Phosphatase: 70 U/L (ref 38–126)
Anion gap: 10 (ref 5–15)
BUN: 11 mg/dL (ref 8–23)
CO2: 26 mmol/L (ref 22–32)
Calcium: 8.9 mg/dL (ref 8.9–10.3)
Chloride: 104 mmol/L (ref 98–111)
Creatinine: 0.98 mg/dL (ref 0.61–1.24)
GFR, Estimated: >60 mL/min (ref >60)
Glucose, Bld: 98 mg/dL (ref 70–99)
Potassium: 4.3 mmol/L (ref 3.5–5.1)
Sodium: 140 mmol/L (ref 135–145)
Total Bilirubin: 0.3 mg/dL (ref 0.3–1.2)
Total Protein: 7 g/dL (ref 6.5–8.1)

## 2021-09-27 MED ORDER — SODIUM CHLORIDE 0.9% FLUSH
10.0000 mL | Freq: Once | INTRAVENOUS | Status: AC
  Administered 2021-09-27: 10 mL

## 2021-09-27 MED ORDER — HEPARIN SOD (PORK) LOCK FLUSH 100 UNIT/ML IV SOLN
250.0000 [IU] | Freq: Once | INTRAVENOUS | Status: AC
  Administered 2021-09-27: 250 [IU]

## 2021-09-28 ENCOUNTER — Inpatient Hospital Stay: Payer: BC Managed Care – PPO

## 2021-09-28 VITALS — BP 122/72 | HR 89 | Temp 98.3°F | Resp 20

## 2021-09-28 DIAGNOSIS — Z5111 Encounter for antineoplastic chemotherapy: Secondary | ICD-10-CM | POA: Diagnosis not present

## 2021-09-28 DIAGNOSIS — C8221 Follicular lymphoma grade III, unspecified, lymph nodes of head, face, and neck: Secondary | ICD-10-CM

## 2021-09-28 DIAGNOSIS — Z7189 Other specified counseling: Secondary | ICD-10-CM

## 2021-09-28 DIAGNOSIS — C8211 Follicular lymphoma grade II, lymph nodes of head, face, and neck: Secondary | ICD-10-CM | POA: Diagnosis not present

## 2021-09-28 DIAGNOSIS — Z79899 Other long term (current) drug therapy: Secondary | ICD-10-CM | POA: Diagnosis not present

## 2021-09-28 MED ORDER — PALONOSETRON HCL INJECTION 0.25 MG/5ML
0.2500 mg | Freq: Once | INTRAVENOUS | Status: AC
  Administered 2021-09-28: 0.25 mg via INTRAVENOUS
  Filled 2021-09-28: qty 5

## 2021-09-28 MED ORDER — ACETAMINOPHEN 325 MG PO TABS
650.0000 mg | ORAL_TABLET | Freq: Once | ORAL | Status: AC
  Administered 2021-09-28: 650 mg via ORAL
  Filled 2021-09-28: qty 2

## 2021-09-28 MED ORDER — VINCRISTINE SULFATE CHEMO INJECTION 1 MG/ML
2.0000 mg | Freq: Once | INTRAVENOUS | Status: AC
  Administered 2021-09-28: 2 mg via INTRAVENOUS
  Filled 2021-09-28: qty 2

## 2021-09-28 MED ORDER — SODIUM CHLORIDE 0.9 % IV SOLN
10.0000 mg | Freq: Once | INTRAVENOUS | Status: AC
  Administered 2021-09-28: 10 mg via INTRAVENOUS
  Filled 2021-09-28: qty 10

## 2021-09-28 MED ORDER — SODIUM CHLORIDE 0.9 % IV SOLN
750.0000 mg/m2 | Freq: Once | INTRAVENOUS | Status: AC
  Administered 2021-09-28: 2000 mg via INTRAVENOUS
  Filled 2021-09-28: qty 100

## 2021-09-28 MED ORDER — DOXORUBICIN HCL CHEMO IV INJECTION 2 MG/ML
50.0000 mg/m2 | Freq: Once | INTRAVENOUS | Status: AC
  Administered 2021-09-28: 134 mg via INTRAVENOUS
  Filled 2021-09-28: qty 67

## 2021-09-28 MED ORDER — SODIUM CHLORIDE 0.9 % IV SOLN
150.0000 mg | Freq: Once | INTRAVENOUS | Status: AC
  Administered 2021-09-28: 150 mg via INTRAVENOUS
  Filled 2021-09-28: qty 150

## 2021-09-28 MED ORDER — SODIUM CHLORIDE 0.9 % IV SOLN
Freq: Once | INTRAVENOUS | Status: AC
Start: 2021-09-28 — End: 2021-09-28

## 2021-09-28 MED ORDER — SODIUM CHLORIDE 0.9 % IV SOLN
375.0000 mg/m2 | Freq: Once | INTRAVENOUS | Status: AC
  Administered 2021-09-28: 1000 mg via INTRAVENOUS
  Filled 2021-09-28: qty 100

## 2021-09-28 MED ORDER — HEPARIN SOD (PORK) LOCK FLUSH 100 UNIT/ML IV SOLN
250.0000 [IU] | Freq: Once | INTRAVENOUS | Status: AC | PRN
  Administered 2021-09-28: 250 [IU]

## 2021-09-28 MED ORDER — SODIUM CHLORIDE 0.9% FLUSH
10.0000 mL | INTRAVENOUS | Status: DC | PRN
  Administered 2021-09-28: 10 mL

## 2021-09-28 MED ORDER — DIPHENHYDRAMINE HCL 25 MG PO CAPS
50.0000 mg | ORAL_CAPSULE | Freq: Once | ORAL | Status: AC
  Administered 2021-09-28: 50 mg via ORAL
  Filled 2021-09-28: qty 2

## 2021-09-28 NOTE — Patient Instructions (Signed)
[name] ONCOLOGY   Discharge Instructions: Thank you for choosing [facility] to provide your oncology and hematology care.   If you have a lab appointment with the [facility], please go directly to the [facility] and check in at the registration area.   Wear comfortable clothing and clothing appropriate for easy access to any Portacath or PICC line.   We strive to give you quality time with your provider. You may need to reschedule your appointment if you arrive late (15 or more minutes).  Arriving late affects you and other patients whose appointments are after yours.  Also, if you miss three or more appointments without notifying the office, you may be dismissed from the clinic at the provider's discretion.      For prescription refill requests, have your pharmacy contact our office and allow 72 hours for refills to be completed.    Today you received the following chemotherapy and/or immunotherapy agents: doxorubicin, vincristine, cyclophosphamide, rituximab.      To help prevent nausea and vomiting after your treatment, we encourage you to take your nausea medication as directed.  BELOW ARE SYMPTOMS THAT SHOULD BE REPORTED IMMEDIATELY: *FEVER GREATER THAN 100.4 F (38 C) OR HIGHER *CHILLS OR SWEATING *NAUSEA AND VOMITING THAT IS NOT CONTROLLED WITH YOUR NAUSEA MEDICATION *UNUSUAL SHORTNESS OF BREATH *UNUSUAL BRUISING OR BLEEDING *URINARY PROBLEMS (pain or burning when urinating, or frequent urination) *BOWEL PROBLEMS (unusual diarrhea, constipation, pain near the anus) TENDERNESS IN MOUTH AND THROAT WITH OR WITHOUT PRESENCE OF ULCERS (sore throat, sores in mouth, or a toothache) UNUSUAL RASH, SWELLING OR PAIN  UNUSUAL VAGINAL DISCHARGE OR ITCHING   Items with * indicate a potential emergency and should be followed up as soon as possible or go to the Emergency Department if any problems should occur.  Please show the CHEMOTHERAPY ALERT  CARD or IMMUNOTHERAPY ALERT CARD at check-in to the Emergency Department and triage nurse.  Should you have questions after your visit or need to cancel or reschedule your appointment, please contact [facility]  Dept: [phone]  and follow the prompts.  Office hours are 8:00 a.m. to 4:30 p.m. Monday - Friday. Please note that voicemails left after 4:00 p.m. may not be returned until the following business day.  We are closed weekends and major holidays. You have access to a nurse at all times for urgent questions. Please call the main number to the clinic Dept: [phone] and follow the prompts.   For any non-urgent questions, you may also contact your provider using MyChart. We now offer e-Visits for anyone 63 and older to request care online for non-urgent symptoms. For details visit [email or web address].   Also download the MyChart app! Go to the app store, search "MyChart", open the app, select [facility], and log in with your MyChart username and password.  Due to Covid, a mask is required upon entering the hospital/clinic. If you do not have a mask, one will be given to you upon arrival. For doctor visits, patients may have 1 support person aged 63 or older with them. For treatment visits, patients cannot have anyone with them due to current Covid guidelines and our immunocompromised population.

## 2021-09-29 ENCOUNTER — Ambulatory Visit: Payer: 59

## 2021-10-01 ENCOUNTER — Other Ambulatory Visit: Payer: Self-pay

## 2021-10-01 ENCOUNTER — Inpatient Hospital Stay: Payer: BC Managed Care – PPO

## 2021-10-01 VITALS — BP 125/78 | HR 102 | Temp 98.3°F | Resp 20

## 2021-10-01 DIAGNOSIS — Z7189 Other specified counseling: Secondary | ICD-10-CM

## 2021-10-01 DIAGNOSIS — C8221 Follicular lymphoma grade III, unspecified, lymph nodes of head, face, and neck: Secondary | ICD-10-CM

## 2021-10-01 DIAGNOSIS — Z5111 Encounter for antineoplastic chemotherapy: Secondary | ICD-10-CM | POA: Diagnosis not present

## 2021-10-01 DIAGNOSIS — C8211 Follicular lymphoma grade II, lymph nodes of head, face, and neck: Secondary | ICD-10-CM | POA: Diagnosis not present

## 2021-10-01 DIAGNOSIS — Z79899 Other long term (current) drug therapy: Secondary | ICD-10-CM | POA: Diagnosis not present

## 2021-10-01 MED ORDER — PEGFILGRASTIM-CBQV 6 MG/0.6ML ~~LOC~~ SOSY
6.0000 mg | PREFILLED_SYRINGE | Freq: Once | SUBCUTANEOUS | Status: AC
  Administered 2021-10-01: 6 mg via SUBCUTANEOUS
  Filled 2021-10-01: qty 0.6

## 2021-10-01 NOTE — Patient Instructions (Signed)

## 2021-10-03 ENCOUNTER — Encounter: Payer: Self-pay | Admitting: Hematology

## 2021-10-03 NOTE — Progress Notes (Addendum)
Joe [name]   HEMATOLOGY/ONCOLOGY CLINIC NOTE  Date of Service: .09/27/2021   Patient Care Team: Celene Squibb, MD as PCP - General (Internal Medicine)  CHIEF COMPLAINTS/PURPOSE OF CONSULTATION:  Follow-up for cycle 3 of R-CHOP chemotherapy for high-grade follicular lymphoma  HISTORY OF PRESENTING ILLNESS:  Please see previous notes for details on initial presentation  INTERVAL HISTORY  [name] is a very pleasant gentleman who is here for follow-up prior to his third cycle of R-CHOP chemotherapy for his high-grade follicular lymphoma. He notes that his left neck mass has continued to shrink significantly. He notes no notable toxicities from his cycle 2 of R-CHOP. No infection issues. No mouth soreness. No further presyncopal or syncopal episodes. Has been using oxygen at nighttime and has had no issues with altered mental status.  He has been diagnosed with sleep apnea and is still awaiting delivery of his CPAP machine and continues to use nocturnal oxygen at this time. No issues with bleeding with his Lovenox shots. We discussed consideration of switching his PICC line to a Port-A-Cath since he has now been on anticoagulation with Lovenox for nearly 2 months. Labs done today reviewed. No other acute new symptoms.   MEDICAL HISTORY:  Past Medical History:  Diagnosis Date   Anxiety    Arthritis    Cancer ([place])    COVID    x 2 (2020 and 2022)   Depression    Hypertension     SURGICAL HISTORY: Past Surgical History:  Procedure Laterality Date   carpel tunnel     MASS BIOPSY Left 07/14/2021   Procedure: NECK MASS BIOPSY;  Surgeon: Melida Quitter, MD;  [facility]: [facility] OR;  Service: ENT;  Laterality: Left;    SOCIAL HISTORY: Social History   Socioeconomic History   Marital status: Widowed    Spouse name: Not on file   Number of children: Not on file   Years of education: Not on file   Highest education level: Not on file  Occupational History   Not on file  Tobacco  Use   Smoking status: Former    Types: Cigarettes   Smokeless tobacco: Former    Types: Chew  Substance and Sexual Activity   Alcohol use: Yes    Comment: rare   Drug use: No   Sexual activity: Not on file  Other Topics Concern   Not on file  Social History Narrative   Not on file   Social Determinants of Health   Financial Resource Strain: Not on file  Food Insecurity: Not on file  Transportation Needs: Not on file  Physical Activity: Not on file  Stress: Not on file  Social Connections: Not on file  Intimate Partner Violence: Not on file    FAMILY HISTORY: Family History  Problem Relation Age of Onset   Healthy Mother    Healthy Father     ALLERGIES:  has No Known Allergies.  MEDICATIONS:  Current Outpatient Medications  Medication Sig Dispense Refill   acetaminophen (TYLENOL) 325 MG tablet Take 2 tablets (650 mg total) by mouth every 6 (six) hours as needed for mild pain (or Fever >/= 101). 20 tablet 0   allopurinol (ZYLOPRIM) 300 MG tablet Take 300 mg by mouth daily.     amLODipine (NORVASC) 10 MG tablet Take 10 mg by mouth daily.     enoxaparin (LOVENOX) 120 MG/0.8ML injection Inject 0.8 mLs (120 mg total) into the skin every 12 (twelve) hours. 48 mL 0   ergocalciferol (VITAMIN  D2) 1.25 MG (50000 UT) capsule Take 1 capsule (50,000 Units total) by mouth once a week. (Patient taking differently: Take 50,000 Units by mouth once a week. on Wednesday) 12 capsule 0   feeding supplement (ENSURE ENLIVE / ENSURE PLUS) LIQD Take 237 mLs by mouth 2 (two) times daily between meals. 237 mL 12   ondansetron (ZOFRAN) 8 MG tablet Take 1 tablet (8 mg total) by mouth 2 (two) times daily as needed for refractory nausea / vomiting. Start on day 3 after cyclophosphamide chemotherapy. 30 tablet 1   predniSONE (DELTASONE) 20 MG tablet Take 3 tablets (60 mg total) by mouth daily. Take with food on days 2-6 of chemotherapy. 25 tablet 5   prochlorperazine (COMPAZINE) 10 MG tablet Take 1  tablet (10 mg total) by mouth every 6 (six) hours as needed (Nausea or vomiting). 30 tablet 6   sertraline (ZOLOFT) 50 MG tablet Take 50 mg by mouth daily.     simvastatin (ZOCOR) 40 MG tablet Take 40 mg by mouth 3 (three) times a week. (Patient not taking: Reported on 08/16/2021)     sodium chloride flush (NS) 0.9 % SOLN 10-40 mLs by Intracatheter route as needed (flush). 1000 mL 0   sodium chloride flush 0.9 % SOLN injection Inject 10-29ms as needed for intracatheter flush. 1000 mL 0   Sodium Chloride-Sodium Bicarb (SODIUM BICARBONATE/SODIUM CHLORIDE) SOLN 1 application by Mouth Rinse route as needed for dry mouth. 1000 mL 0   No current facility-administered medications for this visit.    REVIEW OF SYSTEMS:   .10 Point review of Systems was done is negative except as noted above.  PHYSICAL EXAMINATION: ECOG PERFORMANCE STATUS: 1 - Symptomatic but completely ambulatory  . Vitals:   09/27/21 1035  BP: (!) 140/93  Pulse: (!) 109  Resp: 20  Temp: 97.8 F (36.6 C)  SpO2: 98%   Filed Weights   09/27/21 1035  Weight: (!) 321 lb 1.6 oz (145.7 kg)   .Body mass index is 47.42 kg/m. .Joe KitchenGENERAL:alert, in no acute distress and comfortable SKIN: no acute rashes, no significant lesions EYES: conjunctiva are pink and non-injected, sclera anicteric OROPHARYNX: MMM, no exudates, no oropharyngeal erythema or ulceration, left neck mass decreased in size  by 80 to 90%. NECK: supple, no JVD LYMPH:  no palpable lymphadenopathy in the cervical, axillary or inguinal regions LUNGS: clear to auscultation b/l with normal respiratory effort HEART: regular rate & rhythm ABDOMEN:  normoactive bowel sounds , non tender, not distended. Extremity: trace pedal edema PSYCH: alert & oriented x 3 with fluent speech NEURO: no focal motor/sensory deficits    LABORATORY DATA:  I have reviewed the data as listed . CBC Latest Ref Rng & Units 09/27/2021 09/07/2021 08/25/2021  WBC 4.0 - 10.5 K/uL 7.7 8.1  1.8(L)  Hemoglobin 13.0 - 17.0 g/dL 12.2(L) 13.0 12.5(L)  Hematocrit 39.0 - 52.0 % 36.7(L) 38.4(L) 38.4(L)  Platelets 150 - 400 K/uL 477(H) 429(H) 129(L)    . CMP Latest Ref Rng & Units 09/27/2021 09/07/2021 08/25/2021  Glucose 70 - 99 mg/dL 98 111(H) 97  BUN 8 - 23 mg/dL _0 Creatinine 0.61 - 1.24 mg/dL 0.98 1.06 0.80  Sodium 135 - 145 mmol/L 140 137 137  Potassium 3.5 - 5.1 mmol/L 4.3 4.4 3.8  Chloride 98 - 111 mmol/L 104 102 97(L)  CO2 22 - 32 mmol/L 26 26 32  Calcium 8.9 - 10.3 mg/dL 8.9 9.1 8.5(L)  Total Protein 6.5 - 8.1 g/dL 7.0 6.9 6.1(L)  Total Bilirubin 0.3 - 1.2 mg/dL 0.3 0.3 0.5  Alkaline Phos 38 - 126 U/L 70 69 77  AST 15 - 41 U/L 20 24 37  ALT 0 - 44 U/L 32 60(H) 154(H)     SURGICAL PATHOLOGY  CASE: [id number]  PATIENT: [facility]  Surgical Pathology Report   Clinical History: neck mass (cm)      FINAL MICROSCOPIC DIAGNOSIS:   A. SOFT TISSUE MASS, LEFT NECK, BIOPSY:  - Non-Hodgkin B-cell lymphoma, see comment.   COMMENT:   The majority of the fragments have extensive crush artifact hampering  evaluation. One fragment is well preserved and demonstrates an atypical  lymphoid proliferation with predominately small irregular lymphocytes.  Immunohistochemistry is positive for CD20, bcl-2, bcl-6 and CD10. CD23  is largely negative. CyclinD1 and light chain in situ hybridization are  negative. Ki-67 is 20-25%. CD3 and CD5 highlight scattered T-cells. Flow  cytometry ([id number]) reveals predominately T-cells. Overall, the  findings are consistent with a non-Hodgkin B-cell lymphoma, specifically  follicular lymphoma. There is only limited preserved tissue hampering  grading, but this area appears low grade (grade 1-2 of 3). A higher  grade process in the crushed fragments cannot be excluded. Clinical  correlation is recommended. Dr. Redmond Baseman was attempted on 07/18/2021.   RADIOGRAPHIC STUDIES: I have personally reviewed the radiological images as  listed and agreed with the findings in the report. No results found.  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 Based on limited sampling the patient's pathology reveals a grade 1 through 2 follicular lymphoma with a Ki-67 of 25%.  There was a lot of crush artifact in the sample and therefore a high-grade process could not be ruled out. However the predominant element of his mass has grown significantly over the last 1 month which is concerning for a high-grade follicular lymphoma or follicular lymphoma with transformation to large B-cell lymphoma.  CT neck imaging shows concern with compression of left internal jugular vein with mild mass-effect on the airway in the neck and the left carotid is inseparable from the mass.  High risk of syncopal episodes if there is compression of the common carotid baroreceptors and left vagus nerve  PET/CT 08/10/2021 showed  1. Examination is positive for large FDG avid mass within the left neck extending from the level 2 nodal chain into the left side of the superior mediastinum compatible with metabolically active tumor. 2. There are 2 foci of increased uptake within the neck and body of pancreas lymphomatous involvement of the pancreas. 3. Subcentimeter FDG avid right inguinal lymph node also concerning for lymphoma. 4. Several foci of increased uptake within the axial skeleton compatible with malignancy  2) HTN  3) Obstructive sleep apnea not on CPAP machine.  This is causing sleep issues and possible cognitive issues.  4) Depression and anxiety  5) Acute pulmonary embolism due to lymphoma plus body habitus plus possible vascular compression - on lovenox  6) PICC line in situ PLAN -Patient tolerated cycle 2 of R-CHOP without notable toxicities. Labs reviewed today show stable hemoglobin at 12.2, normal WBC count at  7.7k and platelets of 477k CMP within normal limits LDH 247 Patient appropriate to proceed with cycle 3 of R-CHOP chemotherapy with G-CSF support.  -Continue Lovenox for his pulmonary embolism noted on 07/31/2021.  He has been on uninterrupted anticoagulation for 2 months with Lovenox.  Okay  to hold Lovenox for 24 hours prior to Port-A-Cath placement instead of the PICC line. -Continue PICC line flushing and cares in the interim. -PET CT scan in 1 to 2 weeks -Continue management of sleep apnea per pulmonary.  Follow-up  .Joe [name].-IR referral for Port-A-Cath placement instead of PICC line in 1-2 weeks -Follow-up for cycle 3 of treatment and cycle 4 of R-CHOP as scheduled -PET CT scan in 2 weeks   All of the patients questions were answered with apparent satisfaction. The patient knows to call the clinic with any problems, questions or concerns.  . The total time spent in the appointment was 32 minutes and more than 50% was on counseling and direct patient cares, lab review ordering and management of R-CHOP chemotherapy and toxicity assessment.  Sullivan Lone MD [place] AAHIVMS [facility] Hematology/Oncology Physician [facility]

## 2021-10-04 ENCOUNTER — Encounter: Payer: Self-pay | Admitting: Hematology

## 2021-10-04 NOTE — Addendum Note (Signed)
Addended by: Sullivan Lone on: 10/04/2021 01:00 AM   Modules accepted: Orders

## 2021-10-04 NOTE — Addendum Note (Signed)
Addended by: Sullivan Lone on: 10/04/2021 12:53 AM   Modules accepted: Orders

## 2021-10-05 ENCOUNTER — Other Ambulatory Visit: Payer: Self-pay

## 2021-10-05 ENCOUNTER — Inpatient Hospital Stay: Payer: BC Managed Care – PPO

## 2021-10-05 DIAGNOSIS — Z452 Encounter for adjustment and management of vascular access device: Secondary | ICD-10-CM

## 2021-10-05 DIAGNOSIS — Z5111 Encounter for antineoplastic chemotherapy: Secondary | ICD-10-CM | POA: Diagnosis not present

## 2021-10-05 DIAGNOSIS — Z79899 Other long term (current) drug therapy: Secondary | ICD-10-CM | POA: Diagnosis not present

## 2021-10-05 DIAGNOSIS — C8211 Follicular lymphoma grade II, lymph nodes of head, face, and neck: Secondary | ICD-10-CM | POA: Diagnosis not present

## 2021-10-05 MED ORDER — SODIUM CHLORIDE 0.9% FLUSH
10.0000 mL | Freq: Once | INTRAVENOUS | Status: AC
Start: 2021-10-05 — End: 2021-10-05
  Administered 2021-10-05: 10 mL

## 2021-10-05 MED ORDER — HEPARIN SOD (PORK) LOCK FLUSH 100 UNIT/ML IV SOLN: 250.0000 [IU] | Freq: Once | INTRAVENOUS | Status: DC

## 2021-10-09 ENCOUNTER — Other Ambulatory Visit: Payer: Self-pay | Admitting: Radiology

## 2021-10-10 ENCOUNTER — Encounter (HOSPITAL_COMMUNITY): Payer: Self-pay

## 2021-10-10 ENCOUNTER — Other Ambulatory Visit: Payer: Self-pay

## 2021-10-10 ENCOUNTER — Ambulatory Visit (HOSPITAL_COMMUNITY)
Admission: RE | Admit: 2021-10-10 | Discharge: 2021-10-10 | Disposition: A | Discharge status: Home / Self Care | Payer: 59 | Source: Ambulatory Visit | Attending: Hematology | Admitting: Hematology

## 2021-10-10 ENCOUNTER — Ambulatory Visit (HOSPITAL_COMMUNITY)
Admission: RE | Admit: 2021-10-10 | Discharge: 2021-10-10 | Disposition: A | Discharge status: Home / Self Care | Payer: BC Managed Care – PPO | Source: Ambulatory Visit | Attending: Hematology | Admitting: Hematology

## 2021-10-10 DIAGNOSIS — R053 Chronic cough: Secondary | ICD-10-CM | POA: Diagnosis not present

## 2021-10-10 DIAGNOSIS — Z7901 Long term (current) use of anticoagulants: Secondary | ICD-10-CM | POA: Insufficient documentation

## 2021-10-10 DIAGNOSIS — Z86711 Personal history of pulmonary embolism: Secondary | ICD-10-CM | POA: Diagnosis not present

## 2021-10-10 DIAGNOSIS — C7951 Secondary malignant neoplasm of bone: Secondary | ICD-10-CM | POA: Insufficient documentation

## 2021-10-10 DIAGNOSIS — F32A Depression, unspecified: Secondary | ICD-10-CM | POA: Diagnosis not present

## 2021-10-10 DIAGNOSIS — F419 Anxiety disorder, unspecified: Secondary | ICD-10-CM | POA: Insufficient documentation

## 2021-10-10 DIAGNOSIS — Z8616 Personal history of COVID-19: Secondary | ICD-10-CM | POA: Diagnosis not present

## 2021-10-10 DIAGNOSIS — I10 Essential (primary) hypertension: Secondary | ICD-10-CM | POA: Insufficient documentation

## 2021-10-10 DIAGNOSIS — C8221 Follicular lymphoma grade III, unspecified, lymph nodes of head, face, and neck: Secondary | ICD-10-CM | POA: Insufficient documentation

## 2021-10-10 DIAGNOSIS — G4733 Obstructive sleep apnea (adult) (pediatric): Secondary | ICD-10-CM | POA: Diagnosis not present

## 2021-10-10 DIAGNOSIS — C7889 Secondary malignant neoplasm of other digestive organs: Secondary | ICD-10-CM | POA: Diagnosis not present

## 2021-10-10 HISTORY — PX: IR IMAGING GUIDED PORT INSERTION: IMG5740

## 2021-10-10 MED ORDER — LIDOCAINE-EPINEPHRINE (PF) 2 %-1:200000 IJ SOLN
INTRAMUSCULAR | Status: AC
  Filled 2021-10-10: qty 20

## 2021-10-10 MED ORDER — SODIUM CHLORIDE 0.9 % IV SOLN: INTRAVENOUS | Status: DC

## 2021-10-10 MED ORDER — FENTANYL CITRATE (PF) 100 MCG/2ML IJ SOLN
INTRAMUSCULAR | Status: AC | PRN
  Administered 2021-10-10 (×2): 50 ug via INTRAVENOUS

## 2021-10-10 MED ORDER — HEPARIN SOD (PORK) LOCK FLUSH 100 UNIT/ML IV SOLN
INTRAVENOUS | Status: AC | PRN
  Administered 2021-10-10: 500 [IU] via INTRAVENOUS

## 2021-10-10 MED ORDER — HEPARIN SOD (PORK) LOCK FLUSH 100 UNIT/ML IV SOLN
INTRAVENOUS | Status: AC
  Filled 2021-10-10: qty 5

## 2021-10-10 MED ORDER — FENTANYL CITRATE (PF) 100 MCG/2ML IJ SOLN
INTRAMUSCULAR | Status: AC
  Filled 2021-10-10: qty 2

## 2021-10-10 MED ORDER — MIDAZOLAM HCL 2 MG/2ML IJ SOLN
INTRAMUSCULAR | Status: AC
  Filled 2021-10-10: qty 4

## 2021-10-10 MED ORDER — MIDAZOLAM HCL 2 MG/2ML IJ SOLN
INTRAMUSCULAR | Status: AC | PRN
  Administered 2021-10-10 (×2): 1 mg via INTRAVENOUS

## 2021-10-10 MED ORDER — LIDOCAINE-EPINEPHRINE 2 %-1:100000 IJ SOLN
INTRAMUSCULAR | Status: AC | PRN
  Administered 2021-10-10: 20 mL

## 2021-10-10 NOTE — Procedures (Signed)
°  Procedure: R IJ port catheter placement   EBL:   minimal Complications:  none immediate  See full dictation in [facility].  Dillard Cannon MD Main # [phone] Pager  [phone] Mobile [phone]

## 2021-10-10 NOTE — H&P (Signed)
Referring Physician(s): Brunetta Genera  Supervising Physician: Arne Cleveland  Patient Status:  [name] OP  Chief Complaint:  "I'm getting a port a cath"  Subjective: Patient familiar to IR service from PICC placement on 08/14/2021.  He has a history of recently diagnosed bulky stage IV high-grade follicular lymphoma with bulky left neck mass, pancreatic and osseous involvement.  Past medical history also significant for hypertension, obstructive sleep apnea, depression/anxiety and pulmonary embolism in October ,currently on Lovenox.  Scheduled today for port a cath placement to assist with chemotherapy administration.  He currently denies fever, chills, headache, chest pain, worsening dyspnea, abdominal/back pain, nausea, vomiting or bleeding.  He does have chronic cough.  Past Medical History:  Diagnosis Date   Anxiety    Arthritis    Cancer ([place])    COVID    x 2 (2020 and 2022)   Depression    Hypertension    Past Surgical History:  Procedure Laterality Date   carpel tunnel     MASS BIOPSY Left 07/14/2021   Procedure: NECK MASS BIOPSY;  Surgeon: Melida Quitter, MD;  Location: [facility];  Service: ENT;  Laterality: Left;       Allergies: Patient has no known allergies.  Medications: Prior to Admission medications   Medication Sig Start Date End Date Taking? Authorizing Provider  acetaminophen (TYLENOL) 325 MG tablet Take 2 tablets (650 mg total) by mouth every 6 (six) hours as needed for mild pain (or Fever >/= 101). 08/25/21  Yes Sheikh, Omair Latif, DO  allopurinol (ZYLOPRIM) 300 MG tablet Take 300 mg by mouth daily.   Yes [provider]  amLODipine (NORVASC) 10 MG tablet Take 10 mg by mouth daily. 04/26/20  Yes [provider]  enoxaparin (LOVENOX) 120 MG/0.8ML injection Inject 0.8 mLs (120 mg total) into the skin every 12 (twelve) hours. 09/21/21 10/21/21 Yes Brunetta Genera, MD  ergocalciferol (VITAMIN D2) 1.25 MG (50000 UT) capsule Take 1 capsule  (50,000 Units total) by mouth once a week. Patient taking differently: Take 50,000 Units by mouth once a week. on Wednesday 08/09/21  Yes Brunetta Genera, MD  feeding supplement (ENSURE ENLIVE / ENSURE PLUS) LIQD Take 237 mLs by mouth 2 (two) times daily between meals. 08/25/21  Yes Sheikh, Omair Latif, DO  sertraline (ZOLOFT) 50 MG tablet Take 50 mg by mouth daily.   Yes [provider]  sodium chloride flush (NS) 0.9 % SOLN 10-40 mLs by Intracatheter route as needed (flush). 08/25/21  Yes Sheikh, Omair Latif, DO  sodium chloride flush 0.9 % SOLN injection Inject 10-44mls as needed for intracatheter flush. 08/25/21  Yes Sheikh, Omair Latif, DO  Sodium Chloride-Sodium Bicarb (SODIUM BICARBONATE/SODIUM CHLORIDE) SOLN 1 application by Mouth Rinse route as needed for dry mouth. 08/25/21  Yes Sheikh, Omair Latif, DO  ondansetron (ZOFRAN) 8 MG tablet Take 1 tablet (8 mg total) by mouth 2 (two) times daily as needed for refractory nausea / vomiting. Start on day 3 after cyclophosphamide chemotherapy. 08/14/21   Brunetta Genera, MD  predniSONE (DELTASONE) 20 MG tablet Take 3 tablets (60 mg total) by mouth daily. Take with food on days 2-6 of chemotherapy. 08/14/21   Brunetta Genera, MD  prochlorperazine (COMPAZINE) 10 MG tablet Take 1 tablet (10 mg total) by mouth every 6 (six) hours as needed (Nausea or vomiting). 08/14/21   Brunetta Genera, MD  simvastatin (ZOCOR) 40 MG tablet Take 40 mg by mouth 3 (three) times a week. Patient not taking: Reported  on 08/16/2021 06/26/21   [provider]     Vital Signs: BP 117/83    Pulse (!) 108    Temp 98.4 F (36.9 C) (Oral)    Resp 20    Ht 5\' 9"  (1.753 m)    Wt (!) 314 lb (142.4 kg)    SpO2 95%    BMI 46.37 kg/m   Physical Exam awake, alert.  Chest clear to auscultation bilaterally.  Heart with tachycardic but regular rhythm.  Abdomen obese, soft, positive bowel sounds, nontender.   Trace pedal edema; left neck/nodal  mass  Imaging: No results found.  Labs:  CBC: Recent Labs    08/24/21 0515 08/25/21 0851 09/07/21 1005 09/27/21 1023  WBC 9.4 1.8* 8.1 7.7  HGB 12.6* 12.5* 13.0 12.2*  HCT 38.4* 38.4* 38.4* 36.7*  PLT 148* 129* 429* 477*    COAGS: Recent Labs    08/01/21 0530  INR 1.1  APTT 89*    BMP: Recent Labs    08/24/21 0515 08/25/21 0500 09/07/21 1005 09/27/21 1023  NA 136 137 137 140  K 4.0 3.8 4.4 4.3  CL 95* 97* 102 104  CO2 35* 32 26 26  GLUCOSE 86 97 111* 98  BUN 21 19 11 11   CALCIUM 8.4* 8.5* 9.1 8.9  CREATININE 0.93 0.80 1.06 0.98  GFRNONAA >60 >60 >60 >60    LIVER FUNCTION TESTS: Recent Labs    08/24/21 0515 08/25/21 0500 09/07/21 1005 09/27/21 1023  BILITOT 0.9 0.5 0.3 0.3  AST 47* 37 24 20  ALT 161* 154* 60* 32  ALKPHOS 88 77 69 70  PROT 6.2* 6.1* 6.9 7.0  ALBUMIN 3.1* 3.0* 3.4* 3.7    Assessment and Plan: Patient familiar to IR service from PICC placement on 08/14/2021.  He has a history of recently diagnosed bulky stage IV high-grade follicular lymphoma with bulky left neck mass, pancreatic and osseous involvement.  Past medical history also significant for hypertension, obstructive sleep apnea, depression/anxiety and pulmonary embolism in October ,currently on Lovenox.  Scheduled today for port a cath placement to assist with chemotherapy administration. Risks and benefits of image guided port-a-catheter placement was discussed with the patient including, but not limited to bleeding, infection, pneumothorax, or fibrin sheath development and need for additional procedures.  All of the patient's questions were answered, patient is agreeable to proceed. Consent signed and in chart.    Electronically Signed: D. Rowe Robert, PA-C 10/10/2021, 10:27 AM   I spent a total of 25 minutes at the the patient's bedside AND on the patient's hospital floor or unit, greater than 50% of which was counseling/coordinating care for Port-A-Cath  placement

## 2021-10-11 ENCOUNTER — Inpatient Hospital Stay: Payer: BC Managed Care – PPO

## 2021-10-13 ENCOUNTER — Emergency Department (HOSPITAL_COMMUNITY): Payer: BC Managed Care – PPO

## 2021-10-13 ENCOUNTER — Other Ambulatory Visit: Payer: Self-pay

## 2021-10-13 ENCOUNTER — Encounter (HOSPITAL_COMMUNITY): Payer: Self-pay | Admitting: *Deleted

## 2021-10-13 ENCOUNTER — Emergency Department (HOSPITAL_COMMUNITY)
Admission: EM | Admit: 2021-10-13 | Discharge: 2021-10-13 | Disposition: A | Discharge status: Home / Self Care | Payer: BC Managed Care – PPO | Attending: Emergency Medicine | Admitting: Emergency Medicine

## 2021-10-13 DIAGNOSIS — Z20822 Contact with and (suspected) exposure to covid-19: Secondary | ICD-10-CM | POA: Insufficient documentation

## 2021-10-13 DIAGNOSIS — Z8616 Personal history of COVID-19: Secondary | ICD-10-CM | POA: Insufficient documentation

## 2021-10-13 DIAGNOSIS — Z79899 Other long term (current) drug therapy: Secondary | ICD-10-CM | POA: Diagnosis not present

## 2021-10-13 DIAGNOSIS — R059 Cough, unspecified: Secondary | ICD-10-CM | POA: Diagnosis not present

## 2021-10-13 DIAGNOSIS — Z87891 Personal history of nicotine dependence: Secondary | ICD-10-CM | POA: Diagnosis not present

## 2021-10-13 DIAGNOSIS — R0602 Shortness of breath: Secondary | ICD-10-CM | POA: Insufficient documentation

## 2021-10-13 DIAGNOSIS — Z85828 Personal history of other malignant neoplasm of skin: Secondary | ICD-10-CM | POA: Insufficient documentation

## 2021-10-13 DIAGNOSIS — R55 Syncope and collapse: Secondary | ICD-10-CM | POA: Diagnosis not present

## 2021-10-13 DIAGNOSIS — R051 Acute cough: Secondary | ICD-10-CM

## 2021-10-13 DIAGNOSIS — I10 Essential (primary) hypertension: Secondary | ICD-10-CM | POA: Insufficient documentation

## 2021-10-13 LAB — CBC WITH DIFFERENTIAL/PLATELET
Abs Immature Granulocytes: 0.31 10*3/uL — ABNORMAL HIGH (ref 0.00–0.07)
Basophils Absolute: 0.1 10*3/uL (ref 0.0–0.1)
Basophils Relative: 2 %
Eosinophils Absolute: 0.3 10*3/uL (ref 0.0–0.5)
Eosinophils Relative: 3 %
HCT: 35 % — ABNORMAL LOW (ref 39.0–52.0)
Hemoglobin: 11.7 g/dL — ABNORMAL LOW (ref 13.0–17.0)
Immature Granulocytes: 4 %
Lymphocytes Relative: 14 %
Lymphs Abs: 1.1 10*3/uL (ref 0.7–4.0)
MCH: 31.3 pg (ref 26.0–34.0)
MCHC: 33.4 g/dL (ref 30.0–36.0)
MCV: 93.6 fL (ref 80.0–100.0)
Monocytes Absolute: 0.8 10*3/uL (ref 0.1–1.0)
Monocytes Relative: 10 %
Neutro Abs: 5.1 10*3/uL (ref 1.7–7.7)
Neutrophils Relative %: 67 %
Platelets: 280 10*3/uL (ref 150–400)
RBC: 3.74 MIL/uL — ABNORMAL LOW (ref 4.22–5.81)
RDW: 17.6 % — ABNORMAL HIGH (ref 11.5–15.5)
WBC: 7.7 10*3/uL (ref 4.0–10.5)
nRBC: 1.4 % — ABNORMAL HIGH (ref 0.0–0.2)

## 2021-10-13 LAB — COMPREHENSIVE METABOLIC PANEL
ALT: 26 U/L (ref 0–44)
AST: 20 U/L (ref 15–41)
Albumin: 3.8 g/dL (ref 3.5–5.0)
Alkaline Phosphatase: 68 U/L (ref 38–126)
Anion gap: 11 (ref 5–15)
BUN: 9 mg/dL (ref 8–23)
CO2: 25 mmol/L (ref 22–32)
Calcium: 8.9 mg/dL (ref 8.9–10.3)
Chloride: 101 mmol/L (ref 98–111)
Creatinine, Ser: 0.94 mg/dL (ref 0.61–1.24)
GFR, Estimated: >60 mL/min (ref >60)
Glucose, Bld: 103 mg/dL — ABNORMAL HIGH (ref 70–99)
Potassium: 3.9 mmol/L (ref 3.5–5.1)
Sodium: 137 mmol/L (ref 135–145)
Total Bilirubin: 0.4 mg/dL (ref 0.3–1.2)
Total Protein: 7 g/dL (ref 6.5–8.1)

## 2021-10-13 LAB — RESP PANEL BY RT-PCR (FLU A&B, COVID) ARPGX2
Influenza A by PCR: NEGATIVE
Influenza B by PCR: NEGATIVE
SARS Coronavirus 2 by RT PCR: NEGATIVE

## 2021-10-13 MED ORDER — PREDNISONE 50 MG PO TABS
60.0000 mg | ORAL_TABLET | Freq: Once | ORAL | Status: AC
  Administered 2021-10-13: 17:00:00 60 mg via ORAL
  Filled 2021-10-13: qty 1

## 2021-10-13 MED ORDER — PREDNISONE 20 MG PO TABS: 40.0000 mg | ORAL_TABLET | Freq: Every day | ORAL | 0 refills | Status: AC

## 2021-10-13 MED ORDER — ALBUTEROL SULFATE HFA 108 (90 BASE) MCG/ACT IN AERS: 1.0000 | INHALATION_SPRAY | Freq: Four times a day (QID) | RESPIRATORY_TRACT | 0 refills | Status: DC | PRN

## 2021-10-13 MED ORDER — IOHEXOL 350 MG/ML SOLN
75.0000 mL | Freq: Once | INTRAVENOUS | Status: AC | PRN
  Administered 2021-10-13: 16:00:00 75 mL via INTRAVENOUS

## 2021-10-13 MED ORDER — BENZONATATE 100 MG PO CAPS
100.0000 mg | ORAL_CAPSULE | Freq: Once | ORAL | Status: AC
  Administered 2021-10-13: 17:00:00 100 mg via ORAL
  Filled 2021-10-13: qty 1

## 2021-10-13 NOTE — ED Triage Notes (Signed)
Syncopal episodes after coughing

## 2021-10-13 NOTE — ED Provider Notes (Signed)
[facility] EMERGENCY DEPARTMENT Provider Note   CSN: [id number] Arrival date & time: 10/13/21  1257     History Chief Complaint  Patient presents with   Near Syncope    CRIT OBREMSKI is a 65 y.o. 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  Presents to the emergency department with a chief complaint of of syncope.  Patient states that he has been having syncopal episodes lasting 1 to 2 seconds after episodes of coughing.  Patient states that he has had similar episodes in the past however this had improved after reduction in size of neck mass.  Patient states that he has had approximately 6-8 syncopal episodes today alone.  Patient states that he has had productive cough since Monday.  Cough is producing green mucus.  Patient reports increased shortness of breath.  Denies any fever, chills, chest pain, leg swelling or tenderness, palpitations, nausea, vomiting, abdominal pain, diarrhea, dysuria, hematuria, urinary urgency.  Patient is currently taking Lovenox twice daily for history of PE.  This at his last chemotherapy infusion was approximately 3 weeks prior.      Near Syncope Associated symptoms include shortness of breath. Pertinent negatives include no chest pain, no abdominal pain and no headaches.      Past Medical History:  Diagnosis Date   Anxiety    Arthritis    Cancer ([place])    COVID    x 2 (2020 and 2022)   Depression    Hypertension     Patient Active Problem List   Diagnosis Date Noted   PICC (peripherally inserted central catheter) flush 09/07/2021   Encounter for antineoplastic chemotherapy    Syncope 08/16/2021   Neck mass    Lymphoma of lymph nodes of neck (HCC)    Recurrent syncope 08/15/2021   Obesity, Class III, BMI 40-49.9 (morbid obesity) ([place]) 08/15/2021   Counseling regarding advance care planning and goals of care 13/24/4010   Follicular lymphoma ([place])  08/01/2021   Essential hypertension 08/01/2021   Hyperlipidemia 08/01/2021   OSA (obstructive sleep apnea) 08/01/2021   Depression 08/01/2021   Pulmonary embolism ([place]) 07/31/2021    Past Surgical History:  Procedure Laterality Date   carpel tunnel     IR IMAGING GUIDED PORT INSERTION  10/10/2021   MASS BIOPSY Left 07/14/2021   Procedure: NECK MASS BIOPSY;  Surgeon: Melida Quitter, MD;  Location: [facility] OR;  Service: ENT;  Laterality: Left;       Family History  Problem Relation Age of Onset   Healthy Mother    Healthy Father     Social History   Tobacco Use   Smoking status: Former    Types: Cigarettes   Smokeless tobacco: Former    Types: Chew  Substance Use Topics   Alcohol use: Yes    Comment: rare   Drug use: No    Home Medications Prior to Admission medications   Medication Sig Start Date End Date Taking? Authorizing Provider  acetaminophen (TYLENOL) 325 MG tablet Take 2 tablets (650 mg total) by mouth every 6 (six) hours as needed for mild pain (or Fever >/= 101). 08/25/21   Raiford Noble Latif, DO  allopurinol (ZYLOPRIM) 300 MG tablet Take 300 mg by mouth daily.    [provider]  amLODipine (NORVASC) 10 MG tablet Take 10 mg by mouth daily. 04/26/20   [provider]  enoxaparin (LOVENOX) 120 MG/0.8ML injection Inject 0.8 mLs (120 mg total) into  the skin every 12 (twelve) hours. 09/21/21 10/21/21  Brunetta Genera, MD  ergocalciferol (VITAMIN D2) 1.25 MG (50000 UT) capsule Take 1 capsule (50,000 Units total) by mouth once a week. Patient taking differently: Take 50,000 Units by mouth once a week. on Wednesday 08/09/21   Brunetta Genera, MD  feeding supplement (ENSURE ENLIVE / ENSURE PLUS) LIQD Take 237 mLs by mouth 2 (two) times daily between meals. 08/25/21   Sheikh, Omair Latif, DO  ondansetron (ZOFRAN) 8 MG tablet Take 1 tablet (8 mg total) by mouth 2 (two) times daily as needed for refractory nausea / vomiting. Start on day 3 after  cyclophosphamide chemotherapy. 08/14/21   Brunetta Genera, MD  predniSONE (DELTASONE) 20 MG tablet Take 3 tablets (60 mg total) by mouth daily. Take with food on days 2-6 of chemotherapy. 08/14/21   Brunetta Genera, MD  prochlorperazine (COMPAZINE) 10 MG tablet Take 1 tablet (10 mg total) by mouth every 6 (six) hours as needed (Nausea or vomiting). 08/14/21   Brunetta Genera, MD  sertraline (ZOLOFT) 50 MG tablet Take 50 mg by mouth daily.    [provider]  simvastatin (ZOCOR) 40 MG tablet Take 40 mg by mouth 3 (three) times a week. Patient not taking: Reported on 08/16/2021 06/26/21   [provider]  sodium chloride flush (NS) 0.9 % SOLN 10-40 mLs by Intracatheter route as needed (flush). 08/25/21   Raiford Noble Latif, DO  sodium chloride flush 0.9 % SOLN injection Inject 10-42mls as needed for intracatheter flush. 08/25/21   Raiford Noble Latif, DO  Sodium Chloride-Sodium Bicarb (SODIUM BICARBONATE/SODIUM CHLORIDE) SOLN 1 application by Mouth Rinse route as needed for dry mouth. 08/25/21   Kerney Elbe, DO    Allergies    Patient has no known allergies.  Review of Systems   Review of Systems  Constitutional:  Negative for chills and fever.  HENT:  Negative for trouble swallowing.   Eyes:  Negative for visual disturbance.  Respiratory:  Positive for cough and shortness of breath.   Cardiovascular:  Positive for near-syncope. Negative for chest pain.  Gastrointestinal:  Negative for abdominal pain, nausea and vomiting.  Genitourinary:  Negative for difficulty urinating, dysuria, frequency and hematuria.  Musculoskeletal:  Negative for back pain, neck pain and neck stiffness.  Skin:  Negative for color change and rash.  Neurological:  Positive for syncope. Negative for dizziness, tremors, seizures, facial asymmetry, speech difficulty, weakness, light-headedness, numbness and headaches.  Psychiatric/Behavioral:  Negative for confusion.    Physical  Exam Updated Vital Signs BP 137/81    Pulse 99    Temp 98.4 F (36.9 C)    Resp 20    SpO2 94%   Physical Exam Vitals and nursing note reviewed.  Constitutional:      General: He is not in acute distress.    Appearance: He is not ill-appearing, toxic-appearing or diaphoretic.  HENT:     Head: Normocephalic.  Eyes:     General: No scleral icterus.       Right eye: No discharge.        Left eye: No discharge.  Neck:     Comments: Patient appears to have swelling to left side of his neck however difficult to appreciate due to patient's body habitus Cardiovascular:     Rate and Rhythm: Normal rate.     Pulses:          Radial pulses are 2+ on the right side and 2+ on the  left side.  Pulmonary:     Effort: Pulmonary effort is normal. No tachypnea, bradypnea or respiratory distress.     Breath sounds: Normal breath sounds. No stridor.  Abdominal:     General: Abdomen is protuberant. There is no distension. There are no signs of injury.     Palpations: Abdomen is soft. There is no mass or pulsatile mass.     Tenderness: There is no abdominal tenderness. There is no guarding or rebound.     Hernia: A hernia is present. Hernia is present in the umbilical area.     Comments: Easily reducible umbilical hernia  Musculoskeletal:     Right lower leg: No swelling, deformity, lacerations, tenderness or bony tenderness. No edema.     Left lower leg: No swelling, deformity, lacerations, tenderness or bony tenderness. No edema.  Skin:    General: Skin is warm and dry.  Neurological:     General: No focal deficit present.     Mental Status: He is alert.  Psychiatric:        Behavior: Behavior is cooperative.    ED Results / Procedures / Treatments   Labs (all labs ordered are listed, but only abnormal results are displayed) Labs Reviewed  COMPREHENSIVE METABOLIC PANEL - Abnormal; Notable for the following components:      Result Value   Glucose, Bld 103 (*)    All other components  within normal limits  CBC WITH DIFFERENTIAL/PLATELET - Abnormal; Notable for the following components:   RBC 3.74 (*)    Hemoglobin 11.7 (*)    HCT 35.0 (*)    RDW 17.6 (*)    nRBC 1.4 (*)    Abs Immature Granulocytes 0.31 (*)    All other components within normal limits  RESP PANEL BY RT-PCR (FLU A&B, COVID) ARPGX2  URINALYSIS, ROUTINE W REFLEX MICROSCOPIC    EKG EKG Interpretation  Date/Time:  Saturday October 13 2021 13:35:35 EST Ventricular Rate:  99 PR Interval:  190 QRS Duration: 99 QT Interval:  344 QTC Calculation: 442 R Axis:   -72 Text Interpretation: Sinus rhythm Left anterior fascicular block Abnormal R-wave progression, late transition no acute findings Confirmed by Noemi Chapel [phone]) on 10/13/2021 4:12:01 PM  Radiology CT Angio Chest PE W and/or Wo Contrast  Result Date: 10/13/2021 CLINICAL DATA:  Pulmonary embolism (PE) suspected, high prob EXAM: CT ANGIOGRAPHY CHEST WITH CONTRAST TECHNIQUE: Multidetector CT imaging of the chest was performed using the standard protocol during bolus administration of intravenous contrast. Multiplanar CT image reconstructions and MIPs were obtained to evaluate the vascular anatomy. CONTRAST:  23mL OMNIPAQUE IOHEXOL 350 MG/ML SOLN COMPARISON:  August 15, 2021. FINDINGS: Cardiovascular: Evaluation is limited by a suboptimal contrast timing and motion. Within these limitations, no pulmonary embolism through the segmental pulmonary arteries. RIGHT chest port with tip terminating at the superior cavoatrial junction. No pericardial effusion. Mediastinum/Nodes: No new axillary or mediastinal adenopathy. Previously described LEFT neck mass consistent with non-Hodgkin lymphoma is markedly decreased in size and conspicuity. There is faint residual LEFT supraclavicular fat stranding likely reflecting the sequela of treated lymphoma. Lunate configuration of the trachea. Lungs/Pleura: No pleural effusion or pneumothorax. Scattered bronchial wall  thickening and endobronchial debris. Mild mosaic attenuation of the lungs which may be due to expiratory imaging. There are new scattered pulmonary nodules with representative nodules as follows: Nodule 1: LEFT lower lobe ground-glass pulmonary nodule measures 6 x 5 mm (series 5, image 212). Nodule 2: RIGHT lower lobe paramediastinal ground-glass nodule measures 7 by  4 mm (series 5, image 221). Upper Abdomen: Hepatic steatosis. Musculoskeletal: Gynecomastia. Degenerative changes of the thoracic spine. Review of the MIP images confirms the above findings. IMPRESSION: 1. No evidence of pulmonary embolism through the proximal segmental pulmonary arteries. 2. 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 3. New scattered ground-glass pulmonary nodules, likely infectious or inflammatory in etiology. Recommend attention on follow-up as per oncologic clinical protocol. 4. Scattered areas of endobronchial debris and bronchial wall thickening, likely infectious or inflammatory in etiology. 5. Interval marked decrease in size and conspicuity of biopsy proven LEFT neck non-Hodgkin's lymphoma. Electronically Signed   By: Valentino Saxon M.D.   On: 10/13/2021 16:24   DG Chest Portable 1 View  Result Date: 10/13/2021 CLINICAL DATA:  Cough since Friday.  Syncopal episodes. EXAM: PORTABLE CHEST 1 VIEW COMPARISON:  08/15/2021 FINDINGS: Right Port-A-Cath tip mid SVC. Apical lordotic positioning. Midline trachea. Normal heart size. Left-sided neck mass is not readily apparent today. No pleural effusion or pneumothorax. Clear lungs. IMPRESSION: No acute cardiopulmonary disease. Electronically Signed   By: Abigail Miyamoto M.D.   On: 10/13/2021 14:27    Procedures Procedures   Medications Ordered in ED Medications  iohexol (OMNIPAQUE) 350 MG/ML injection 75 mL (75 mLs Intravenous Contrast Given 10/13/21 1547)  benzonatate (TESSALON) capsule 100 mg (100 mg Oral Given 10/13/21  1646)  predniSONE (DELTASONE) tablet 60 mg (60 mg Oral Given 10/13/21 1646)    ED Course  I have reviewed the triage vital signs and the nursing notes.  Pertinent labs & imaging results that were available during my care of the patient were reviewed by me and considered in my medical decision making (see chart for details).    MDM Rules/Calculators/A&P                          Alert 65 year old male no acute stress, nontoxic-appearing.  Presents emergency department chief plaint of syncopal episode after episodes of coughing.  States that this has been occurring since Monday.  Patient states that he has had productive cough since Monday.  Patient reports history of similar syncopal events however had improved after he had reduction in size of neck mass related to his lymphoma.  The patient syncopal episode, cough, and shortness of breath, active cancer, and history of PE will obtain CTA to evaluate for possible PE causing patient's symptoms.  Additionally will obtain rectal temperature, chest x-ray, CMP, CBC.  CMP is unremarkable CBC shows no leukopenia or leukocytosis.  Mild anemia noted however appears baseline for patient. EKG shows sinus rhythm  Chest x-ray shows no active cardiopulmonary disease. CTA shows no evidence of PE.  New scattered groundglass pulmonary nodules, likely infectious or inflammatory in etiology.    Suspect that patient's cough is viral in nature.  Will obtain COVID-19 and influenza testing.  Patient was given Tessalon and prednisone to help with his cough.  Patient reports improvement in his cough after receiving these medications.  Denies any further episodes of syncope in the emergency department.  EKG shows sinus rhythm and patient denies any chest pain or palpitations.  Low suspicion for cardiac cause of patient's syncope at this time.  Suspect that pressure from patient's neck mass on coughing is causing his syncopal episode.  Will prescribe patient with  Tessalon, prednisone, and albuterol inhaler to help with his cough.  Patient states that he currently has a prescription for Tessalon and does not need this medication at this time.  We will have patient follow-up closely with his primary care provider and oncologist.  Discussed results, findings, treatment and follow up. Patient advised of return precautions. Patient verbalized understanding and agreed with plan.  Patient care discussed with attending physician Dr. Sabra Heck.       Final Clinical Impression(s) / ED Diagnoses Final diagnoses:  Acute cough  Syncope, unspecified syncope type    Rx / DC Orders ED Discharge Orders          Ordered    predniSONE (DELTASONE) 20 MG tablet  Daily with breakfast        10/13/21 1702    albuterol (VENTOLIN HFA) 108 (90 Base) MCG/ACT inhaler  Every 6 hours PRN        10/13/21 1702             Dyann Ruddle 10/13/21 Mariane Masters, MD 10/15/21 [phone]

## 2021-10-13 NOTE — Discharge Instructions (Addendum)
You came to the emergency department today to be evaluated for your syncopal episodes after coughing.  Your physical exam was reassuring.  The CT scan of your chest showed no blood clot or signs of pneumonia.  Your lab work was reassuring.  It appears that your syncopal episodes are being brought on by your cough.  We will prescribe medication to help reduce your cough.  I have given you a prescription for albuterol here which you may use every 4-6 hours.  I have also given you a prescription for steroids.   Some common side effects of steroids include feelings of extra energy, feeling warm, increased appetite, and stomach upset.  If you are diabetic your sugars may run higher than usual.   Please make sure to follow-up closely with your primary care doctor and oncologist.  You have a test for COVID-19 and the flu pending at this time.  You can find your results on your [facility] health MyChart.  If positive for either of these viruses please self isolate for 5 days from your symptom onset.  Get help right away if: You faint. You hit your head or are injured after fainting. You have any of these symptoms that may indicate trouble with your heart: Fast or irregular heartbeats (palpitations). Unusual pain in your chest, abdomen, or back. Shortness of breath. You have a seizure. You have a severe headache. You are confused. You have vision problems. You have severe weakness or trouble walking. You are bleeding from your mouth or rectum, or you have black or tarry stool.

## 2021-10-17 DIAGNOSIS — R059 Cough, unspecified: Secondary | ICD-10-CM | POA: Diagnosis not present

## 2021-10-18 ENCOUNTER — Encounter: Payer: Self-pay | Admitting: Hematology

## 2021-10-18 ENCOUNTER — Telehealth: Payer: Self-pay | Admitting: *Deleted

## 2021-10-18 ENCOUNTER — Other Ambulatory Visit: Payer: Self-pay

## 2021-10-18 ENCOUNTER — Inpatient Hospital Stay (HOSPITAL_BASED_OUTPATIENT_CLINIC_OR_DEPARTMENT_OTHER): Payer: BC Managed Care – PPO | Admitting: Hematology

## 2021-10-18 ENCOUNTER — Other Ambulatory Visit: Payer: 59

## 2021-10-18 ENCOUNTER — Inpatient Hospital Stay: Payer: BC Managed Care – PPO

## 2021-10-18 ENCOUNTER — Other Ambulatory Visit (HOSPITAL_COMMUNITY): Payer: Self-pay

## 2021-10-18 ENCOUNTER — Inpatient Hospital Stay: Payer: BC Managed Care – PPO | Admitting: Nutrition

## 2021-10-18 VITALS — BP 115/65 | HR 82 | Temp 97.8°F | Resp 18

## 2021-10-18 VITALS — BP 150/88 | HR 94 | Temp 97.5°F | Resp 17 | Wt 317.5 lb

## 2021-10-18 DIAGNOSIS — C8221 Follicular lymphoma grade III, unspecified, lymph nodes of head, face, and neck: Secondary | ICD-10-CM

## 2021-10-18 DIAGNOSIS — Z79899 Other long term (current) drug therapy: Secondary | ICD-10-CM | POA: Diagnosis not present

## 2021-10-18 DIAGNOSIS — Z7189 Other specified counseling: Secondary | ICD-10-CM

## 2021-10-18 DIAGNOSIS — J069 Acute upper respiratory infection, unspecified: Secondary | ICD-10-CM | POA: Diagnosis not present

## 2021-10-18 DIAGNOSIS — Z452 Encounter for adjustment and management of vascular access device: Secondary | ICD-10-CM

## 2021-10-18 DIAGNOSIS — C8211 Follicular lymphoma grade II, lymph nodes of head, face, and neck: Secondary | ICD-10-CM | POA: Diagnosis not present

## 2021-10-18 DIAGNOSIS — Z5111 Encounter for antineoplastic chemotherapy: Secondary | ICD-10-CM

## 2021-10-18 LAB — CMP (CANCER CENTER ONLY)
ALT: 28 U/L (ref 0–44)
AST: 17 U/L (ref 15–41)
Albumin: 3.9 g/dL (ref 3.5–5.0)
Alkaline Phosphatase: 54 U/L (ref 38–126)
Anion gap: 6 (ref 5–15)
BUN: 20 mg/dL (ref 8–23)
CO2: 31 mmol/L (ref 22–32)
Calcium: 9 mg/dL (ref 8.9–10.3)
Chloride: 104 mmol/L (ref 98–111)
Creatinine: 1.01 mg/dL (ref 0.61–1.24)
GFR, Estimated: >60 mL/min (ref >60)
Glucose, Bld: 87 mg/dL (ref 70–99)
Potassium: 3.4 mmol/L — ABNORMAL LOW (ref 3.5–5.1)
Sodium: 141 mmol/L (ref 135–145)
Total Bilirubin: 0.3 mg/dL (ref 0.3–1.2)
Total Protein: 6.8 g/dL (ref 6.5–8.1)

## 2021-10-18 LAB — CBC WITH DIFFERENTIAL (CANCER CENTER ONLY)
Abs Immature Granulocytes: 0.11 10*3/uL — ABNORMAL HIGH (ref 0.00–0.07)
Basophils Absolute: 0.1 10*3/uL (ref 0.0–0.1)
Basophils Relative: 1 %
Eosinophils Absolute: 0.1 10*3/uL (ref 0.0–0.5)
Eosinophils Relative: 1 %
HCT: 35.3 % — ABNORMAL LOW (ref 39.0–52.0)
Hemoglobin: 11.5 g/dL — ABNORMAL LOW (ref 13.0–17.0)
Immature Granulocytes: 1 %
Lymphocytes Relative: 18 %
Lymphs Abs: 1.7 10*3/uL (ref 0.7–4.0)
MCH: 30 pg (ref 26.0–34.0)
MCHC: 32.6 g/dL (ref 30.0–36.0)
MCV: 92.2 fL (ref 80.0–100.0)
Monocytes Absolute: 1.3 10*3/uL — ABNORMAL HIGH (ref 0.1–1.0)
Monocytes Relative: 14 %
Neutro Abs: 6.1 10*3/uL (ref 1.7–7.7)
Neutrophils Relative %: 65 %
Platelet Count: 456 10*3/uL — ABNORMAL HIGH (ref 150–400)
RBC: 3.83 MIL/uL — ABNORMAL LOW (ref 4.22–5.81)
RDW: 18.6 % — ABNORMAL HIGH (ref 11.5–15.5)
WBC Count: 9.4 10*3/uL (ref 4.0–10.5)
nRBC: 0.9 % — ABNORMAL HIGH (ref 0.0–0.2)

## 2021-10-18 LAB — LACTATE DEHYDROGENASE: LDH: 214 U/L — ABNORMAL HIGH (ref 98–192)

## 2021-10-18 MED ORDER — AMOXICILLIN-POT CLAVULANATE 875-125 MG PO TABS
1.0000 | ORAL_TABLET | Freq: Two times a day (BID) | ORAL | 0 refills | Status: DC
  Filled 2021-10-18: qty 10, 5d supply, fill #0

## 2021-10-18 MED ORDER — SODIUM CHLORIDE 0.9% FLUSH
10.0000 mL | Freq: Once | INTRAVENOUS | Status: AC
  Administered 2021-10-18: 09:00:00 10 mL

## 2021-10-18 MED ORDER — SODIUM CHLORIDE 0.9 % IV SOLN
375.0000 mg/m2 | Freq: Once | INTRAVENOUS | Status: AC
  Administered 2021-10-18: 14:00:00 1000 mg via INTRAVENOUS
  Filled 2021-10-18: qty 100

## 2021-10-18 MED ORDER — SODIUM CHLORIDE 0.9 % IV SOLN: Freq: Once | INTRAVENOUS | Status: AC

## 2021-10-18 MED ORDER — SODIUM CHLORIDE 0.9% FLUSH
10.0000 mL | INTRAVENOUS | Status: DC | PRN
  Administered 2021-10-18: 18:00:00 10 mL

## 2021-10-18 MED ORDER — SODIUM CHLORIDE 0.9 % IV SOLN
150.0000 mg | Freq: Once | INTRAVENOUS | Status: AC
  Administered 2021-10-18: 11:00:00 150 mg via INTRAVENOUS
  Filled 2021-10-18: qty 150

## 2021-10-18 MED ORDER — SODIUM CHLORIDE 0.9 % IV SOLN
10.0000 mg | Freq: Once | INTRAVENOUS | Status: AC
  Administered 2021-10-18: 11:00:00 10 mg via INTRAVENOUS
  Filled 2021-10-18: qty 10

## 2021-10-18 MED ORDER — VINCRISTINE SULFATE CHEMO INJECTION 1 MG/ML
2.0000 mg | Freq: Once | INTRAVENOUS | Status: AC
  Administered 2021-10-18: 12:00:00 2 mg via INTRAVENOUS
  Filled 2021-10-18: qty 2

## 2021-10-18 MED ORDER — SODIUM CHLORIDE 0.9 % IV SOLN
750.0000 mg/m2 | Freq: Once | INTRAVENOUS | Status: AC
  Administered 2021-10-18: 13:00:00 2000 mg via INTRAVENOUS
  Filled 2021-10-18: qty 100

## 2021-10-18 MED ORDER — AMOXICILLIN-POT CLAVULANATE 875-125 MG PO TABS: 1.0000 | ORAL_TABLET | Freq: Two times a day (BID) | ORAL | 0 refills | Status: DC

## 2021-10-18 MED ORDER — ACETAMINOPHEN 325 MG PO TABS
650.0000 mg | ORAL_TABLET | Freq: Once | ORAL | Status: AC
  Administered 2021-10-18: 11:00:00 650 mg via ORAL
  Filled 2021-10-18: qty 2

## 2021-10-18 MED ORDER — DOXORUBICIN HCL CHEMO IV INJECTION 2 MG/ML
50.0000 mg/m2 | Freq: Once | INTRAVENOUS | Status: AC
  Administered 2021-10-18: 12:00:00 134 mg via INTRAVENOUS
  Filled 2021-10-18: qty 67

## 2021-10-18 MED ORDER — PALONOSETRON HCL INJECTION 0.25 MG/5ML
0.2500 mg | Freq: Once | INTRAVENOUS | Status: AC
  Administered 2021-10-18: 11:00:00 0.25 mg via INTRAVENOUS
  Filled 2021-10-18: qty 5

## 2021-10-18 MED ORDER — HEPARIN SOD (PORK) LOCK FLUSH 100 UNIT/ML IV SOLN
500.0000 [IU] | Freq: Once | INTRAVENOUS | Status: AC | PRN
  Administered 2021-10-18: 18:00:00 500 [IU]

## 2021-10-18 MED ORDER — APIXABAN 5 MG PO TABS: 5.0000 mg | ORAL_TABLET | Freq: Two times a day (BID) | ORAL | 3 refills | Status: DC

## 2021-10-18 MED ORDER — DIPHENHYDRAMINE HCL 25 MG PO CAPS
50.0000 mg | ORAL_CAPSULE | Freq: Once | ORAL | Status: AC
  Administered 2021-10-18: 11:00:00 50 mg via ORAL
  Filled 2021-10-18: qty 2

## 2021-10-18 MED ORDER — LIDOCAINE-PRILOCAINE 2.5-2.5 % EX CREA: 1.0000 "application " | TOPICAL_CREAM | CUTANEOUS | 0 refills | Status: AC | PRN

## 2021-10-18 NOTE — Telephone Encounter (Signed)
Medication Prior Authorization Status  Processed CoverMyMeds KEY: [id number]  Authorized Today  Per [employer] [place] Case ID: N/A, none provided.   Effective 10/18/2021 through 01/15/2022.

## 2021-10-18 NOTE — Patient Instructions (Signed)
[facility] ONCOLOGY  Discharge Instructions: Thank you for choosing [facility] to provide your oncology and hematology care.   If you have a lab appointment with the [facility], please go directly to the [facility] and check in at the registration area.   Wear comfortable clothing and clothing appropriate for easy access to any Portacath or PICC line.   We strive to give you quality time with your provider. You may need to reschedule your appointment if you arrive late (15 or more minutes).  Arriving late affects you and other patients whose appointments are after yours.  Also, if you miss three or more appointments without notifying the office, you may be dismissed from the clinic at the providers discretion.      For prescription refill requests, have your pharmacy contact our office and allow 72 hours for refills to be completed.    Today you received the following chemotherapy and/or immunotherapy agents Doxorubicin, Vincristine, cytoxan and Rituxan      To help prevent nausea and vomiting after your treatment, we encourage you to take your nausea medication as directed.  BELOW ARE SYMPTOMS THAT SHOULD BE REPORTED IMMEDIATELY: *FEVER GREATER THAN 100.4 F (38 C) OR HIGHER *CHILLS OR SWEATING *NAUSEA AND VOMITING THAT IS NOT CONTROLLED WITH YOUR NAUSEA MEDICATION *UNUSUAL SHORTNESS OF BREATH *UNUSUAL BRUISING OR BLEEDING *URINARY PROBLEMS (pain or burning when urinating, or frequent urination) *BOWEL PROBLEMS (unusual diarrhea, constipation, pain near the anus) TENDERNESS IN MOUTH AND THROAT WITH OR WITHOUT PRESENCE OF ULCERS (sore throat, sores in mouth, or a toothache) UNUSUAL RASH, SWELLING OR PAIN  UNUSUAL VAGINAL DISCHARGE OR ITCHING   Items with * indicate a potential emergency and should be followed up as soon as possible or go to the Emergency Department if any problems should occur.  Please show the CHEMOTHERAPY ALERT CARD or  IMMUNOTHERAPY ALERT CARD at check-in to the Emergency Department and triage nurse.  Should you have questions after your visit or need to cancel or reschedule your appointment, please contact [facility]  Dept: [phone]  and follow the prompts.  Office hours are 8:00 a.m. to 4:30 p.m. Monday - Friday. Please note that voicemails left after 4:00 p.m. may not be returned until the following business day.  We are closed weekends and major holidays. You have access to a nurse at all times for urgent questions. Please call the main number to the clinic Dept: [phone] and follow the prompts.   For any non-urgent questions, you may also contact your provider using MyChart. We now offer e-Visits for anyone 42 and older to request care online for non-urgent symptoms. For details visit [email or web address].   Also download the MyChart app! Go to the app store, search "MyChart", open the app, select [facility], and log in with your MyChart username and password.  Due to Covid, a mask is required upon entering the hospital/clinic. If you do not have a mask, one will be given to you upon arrival. For doctor visits, patients may have 1 support person aged 60 or older with them. For treatment visits, patients cannot have anyone with them due to current Covid guidelines and our immunocompromised population.

## 2021-10-18 NOTE — Progress Notes (Signed)
Nutrition follow-up completed with patient during infusion for grade 3 follicular lymphoma.  Patient is receiving R-CHOP and followed by Dr. Irene Limbo.  Weight was documented as 317 pounds December 29 improved from 314 pounds December 21. Noted labs: Potassium 3.4.  Patient reports his taste has been improving and he is eating better.  He denies nausea and vomiting as well as constipation and diarrhea.  He continues to eat a variety of foods and is drinking boost plus two times daily.  He has no questions or concerns today.  Nutrition diagnosis: Unintentional weight loss stable.  Intervention: Recommended patient continue boost plus or equivalent twice daily for added calories and protein.   Enforced importance of weight maintenance. Continue baking soda and salt water rinses.  Monitoring, evaluation, goals: Patient will continue to tolerate adequate calories and protein for weight maintenance.  Next visit: To be scheduled as needed.  Patient has contact information for questions or concerns.  **Disclaimer: This note was dictated with voice recognition software. Similar sounding words can inadvertently be transcribed and this note may contain transcription errors which may not have been corrected upon publication of note.**

## 2021-10-20 ENCOUNTER — Other Ambulatory Visit: Payer: Self-pay

## 2021-10-20 ENCOUNTER — Inpatient Hospital Stay: Payer: BC Managed Care – PPO

## 2021-10-20 VITALS — BP 135/77 | HR 108 | Temp 98.0°F | Resp 19

## 2021-10-20 DIAGNOSIS — Z7189 Other specified counseling: Secondary | ICD-10-CM

## 2021-10-20 DIAGNOSIS — C8221 Follicular lymphoma grade III, unspecified, lymph nodes of head, face, and neck: Secondary | ICD-10-CM

## 2021-10-20 DIAGNOSIS — Z5111 Encounter for antineoplastic chemotherapy: Secondary | ICD-10-CM | POA: Diagnosis not present

## 2021-10-20 DIAGNOSIS — C8211 Follicular lymphoma grade II, lymph nodes of head, face, and neck: Secondary | ICD-10-CM | POA: Diagnosis not present

## 2021-10-20 DIAGNOSIS — Z79899 Other long term (current) drug therapy: Secondary | ICD-10-CM | POA: Diagnosis not present

## 2021-10-20 MED ORDER — PEGFILGRASTIM-CBQV 6 MG/0.6ML ~~LOC~~ SOSY
6.0000 mg | PREFILLED_SYRINGE | Freq: Once | SUBCUTANEOUS | Status: AC
  Administered 2021-10-20: 6 mg via SUBCUTANEOUS
  Filled 2021-10-20: qty 0.6

## 2021-10-20 NOTE — Patient Instructions (Signed)

## 2021-10-24 ENCOUNTER — Encounter: Payer: Self-pay | Admitting: Hematology

## 2021-10-24 ENCOUNTER — Ambulatory Visit (INDEPENDENT_AMBULATORY_CARE_PROVIDER_SITE_OTHER): Payer: BC Managed Care – PPO | Admitting: Pulmonary Disease

## 2021-10-24 ENCOUNTER — Other Ambulatory Visit: Payer: Self-pay

## 2021-10-24 ENCOUNTER — Encounter: Payer: Self-pay | Admitting: Pulmonary Disease

## 2021-10-24 VITALS — BP 150/84 | HR 104 | Temp 98.0°F | Ht 69.0 in | Wt 315.0 lb

## 2021-10-24 DIAGNOSIS — J9612 Chronic respiratory failure with hypercapnia: Secondary | ICD-10-CM | POA: Diagnosis not present

## 2021-10-24 DIAGNOSIS — J9611 Chronic respiratory failure with hypoxia: Secondary | ICD-10-CM | POA: Diagnosis not present

## 2021-10-24 DIAGNOSIS — G4730 Sleep apnea, unspecified: Secondary | ICD-10-CM | POA: Diagnosis not present

## 2021-10-24 DIAGNOSIS — E669 Obesity, unspecified: Secondary | ICD-10-CM

## 2021-10-24 NOTE — Progress Notes (Signed)
[facility]  Chief Complaint  Patient presents with   Consult    Patient was in hospital and was told that his O2 drops when sleeping and states that he did HST about a month ago. I dont see that machine has been ordered for patient. Sleeping with 2 liters oxygen.     Past Surgical History:  He  has a past surgical history that includes carpel tunnel; Mass biopsy (Left, 07/14/2021); and IR IMAGING GUIDED PORT INSERTION (10/10/2021).  Past Medical History:  Anxiety, OA, COVID in 2020 and 2022, Depression, HTN, PE with Lt leg DVT October 4098, High grade follicular lymphoma  Constitutional:  BP (!) 150/84 (BP Location: Left Arm, Patient Position: Sitting, Cuff Size: Normal)    Pulse (!) 104    Temp 98 F (36.7 C) (Oral)    Ht 5\' 9"  (1.753 m)    Wt (!) 315 lb (142.9 kg)    SpO2 100%    BMI 46.52 kg/m   Brief Summary:  Joe [name] is a 67 y.o. male former smoker with sleep disordered breathing.       Subjective:   He was in hospital in November 2022 for syncope.  Found to have hypoxia and hypercapnia.  He was sent home with 2 liters oxygen at night.  He had home sleep study in November which showed very severe sleep apnea with low oxygen.  He also has trouble going to sleep and has to use nerve pills to help calm him down.  He goes to bed at 1130 pm.  He we wakes up 1 time to use the bathroom.  He gets out of bed at 5 am.  He still feels tired during the day.  His brother and sister both use CPAP.  He denies sleep walking, sleep talking, bruxism, or nightmares.  There is no history of restless legs.  He denies sleep hallucinations, sleep paralysis, or cataplexy.  The Epworth score is 7 out of 24.   Physical Exam:   Appearance - well kempt   ENMT - no sinus tenderness, no oral exudate, no LAN, Mallampati 4 airway, no stridor, poor dentition  Respiratory - equal breath sounds bilaterally, no wheezing or rales  CV - s1s2 regular  rate and rhythm, no murmurs  Ext - no clubbing, no edema  Skin - no rashes  Psych - normal mood and affect   Pulmonary testing:  ABG 08/22/21 >> pH 7.37, PCO2 61.4, PO2 97.6 PFT 09/04/21 >> FEV1 2.91 (85%), FEV1% 89, TLC 5.79 (85%), DLCO 82%  Chest Imaging:  CT angio chest 10/13/21 >> scattered bronchial wall thickening, mild mosaic attentuation, 6 mm LLL GGO nodule, 7 mm RLL GGO nodule, fatty liver, gynecomastia  Sleep Tests:  HST 09/07/21 >> AHI 68.5, SpO2 low 39%  Cardiac Tests:  Echo 08/01/21 >> EF 60 to 65%  Social History:  He  reports that he quit smoking about 35 years ago. His smoking use included cigarettes. He smoked an average of .25 packs per day. He quit smokeless tobacco use about 33 years ago.  His smokeless tobacco use included chew. He reports current alcohol use. He reports that he does not use drugs.  Family History:  His family history includes Healthy in his father and mother.     Assessment/Plan:   Obstructive sleep apnea. - reviewed his sleep study with him - will arrange for auto CPAP 5 to 20 cm H2O to try and expedite set up  Chronic hypoxic, hypercapnic respiratory failure. - likely from obesity hypoventilation syndrome - he is using 2 liters oxygen at night for now - will arrange for CPAP titration study; can then determine if he needs supplemental oxygen at night with CPAP or if he needs to use Bipap instead  High grade follicular lymphoma - followed by Dr. Irene Limbo with Hematology/Oncology  Pulmonary embolism with Lt leg DVT from October 2022. - remains on eliquis - followed by hematology  Obesity. - discussed how weight can impact sleep and risk for sleep disordered breathing - discussed options to assist with weight loss: combination of diet modification, cardiovascular and strength training exercises  Cardiovascular risk. - had an extensive discussion regarding the adverse health consequences related to untreated sleep disordered  breathing - specifically discussed the risks for hypertension, coronary artery disease, cardiac dysrhythmias, cerebrovascular disease, and diabetes - lifestyle modification discussed  Safe driving practices. - discussed how sleep disruption can increase risk of accidents, particularly when driving - safe driving practices were discussed  Therapies for obstructive sleep apnea. - if the sleep study shows significant sleep apnea, then various therapies for treatment were reviewed: CPAP, oral appliance, and surgical interventions  Time Spent Involved in Patient Care on Day of Examination:  50 minutes  Follow up:   Patient Instructions  Will arrange for auto CPAP set up and schedule CPAP titration study   Follow up in 5 to 6 months  Medication List:   Allergies as of 10/24/2021   No Known Allergies      Medication List        Accurate as of October 24, 2021 10:05 AM. If you have any questions, ask your nurse or doctor.          STOP taking these medications    simvastatin 40 MG tablet Commonly known as: ZOCOR Stopped by: Chesley Mires, MD       TAKE these medications    acetaminophen 325 MG tablet Commonly known as: TYLENOL Take 2 tablets (650 mg total) by mouth every 6 (six) hours as needed for mild pain (or Fever >/= 101).   albuterol 108 (90 Base) MCG/ACT inhaler Commonly known as: VENTOLIN HFA Inhale 1-2 puffs into the lungs every 6 (six) hours as needed for wheezing or shortness of breath.   allopurinol 300 MG tablet Commonly known as: ZYLOPRIM Take 300 mg by mouth daily.   ALPRAZolam 1 MG tablet Commonly known as: XANAX Take 1 mg by mouth 3 (three) times daily as needed.   amLODipine 10 MG tablet Commonly known as: NORVASC Take 10 mg by mouth daily.   amoxicillin-clavulanate 875-125 MG tablet Commonly known as: AUGMENTIN Take 1 tablet by mouth 2 times daily.   apixaban 5 MG Tabs tablet Commonly known as: ELIQUIS Take 1 tablet (5 mg total) by  mouth 2 (two) times daily. This replaces the Lovenox injections   ergocalciferol 1.25 MG (50000 UT) capsule Commonly known as: VITAMIN D2 Take 1 capsule (50,000 Units total) by mouth once a week. What changed: additional instructions   feeding supplement Liqd Take 237 mLs by mouth 2 (two) times daily between meals.   lidocaine-prilocaine cream Commonly known as: EMLA Apply 1 application topically as needed. 30-60 mins prior to port a cath access. Start using 2 weeks after port a cath placement.   ondansetron 8 MG tablet Commonly known as: Zofran Take 1 tablet (8 mg total) by mouth 2 (two) times daily as needed for refractory nausea / vomiting. Start on day 3  after cyclophosphamide chemotherapy.   predniSONE 20 MG tablet Commonly known as: DELTASONE Take 3 tablets (60 mg total) by mouth daily. Take with food on days 2-6 of chemotherapy. What changed: when to take this   prochlorperazine 10 MG tablet Commonly known as: COMPAZINE Take 1 tablet (10 mg total) by mouth every 6 (six) hours as needed (Nausea or vomiting).   sertraline 50 MG tablet Commonly known as: ZOLOFT Take 50 mg by mouth daily.   sodium bicarbonate/sodium chloride Soln 1 application by Mouth Rinse route as needed for dry mouth.   sodium chloride flush 0.9 % Soln Commonly known as: NS 10-40 mLs by Intracatheter route as needed (flush).   Normal Saline Flush 0.9 % Soln Inject 10-66mls as needed for intracatheter flush.        Signature:  Chesley Mires, MD [place] Pager - [phone] 10/24/2021, 10:05 AM

## 2021-10-24 NOTE — Patient Instructions (Signed)
Will arrange for auto CPAP set up and schedule CPAP titration study   Follow up in 5 to 6 months

## 2021-10-24 NOTE — Progress Notes (Signed)
. ° ° °HEMATOLOGY/ONCOLOGY CLINIC NOTE ° °Date of Service: .10/18/2021 ° ° °Patient Care Team: °[name], Joe Z, MD as PCP - General (Internal Medicine) ° °CHIEF COMPLAINTS/PURPOSE OF CONSULTATION:  °Follow-up for cycle 4 of R-CHOP chemotherapy for high-grade follicular lymphoma ° °HISTORY OF PRESENTING ILLNESS:  °Please see previous notes for details on initial presentation ° °INTERVAL HISTORY ° °[name] is here for follow-up of his high-grade follicular lymphoma for follow-up prior to his fourth cycle of R-CHOP chemotherapy. °He notes no acute new symptoms. °He did have his PICC line replaced for a port which he notes is much more comfortable. ° °He did have to go to the endocrine emergency room on Christmas eve with near syncopal episode after coughing.  Had productive cough with green mucus.  He was prescribed steroids and Tessalon Perles. °Patient still having some cough and given that he is on antibiotics we did send in a prescription for Augmentin daily for any possible upper respiratory infection. ° °He he did have CTA of the chest on 10/13/2021 which showed no evidence of pulmonary embolism. °Noted to have some inflammatory pulmonary nodules.  Denies any overt symptoms of aspiration. ° °He would like to switch from Lovenox to oral anticoagulation and this was done today since he has completed 2 months of Lovenox and his recent CT chest did not show any PE. ° °Labs done today were reviewed with the patient. ° °MEDICAL HISTORY:  °Past Medical History:  °Diagnosis Date  ° Anxiety   ° Arthritis   ° COVID   ° x 2 (2020 and 2022)  ° Depression   ° Follicular lymphoma (HCC)   ° Hypertension   ° Left leg DVT (HCC) 07/2021  ° Pulmonary embolism (HCC) 07/2021  ° ° °SURGICAL HISTORY: °Past Surgical History:  °Procedure Laterality Date  ° carpel tunnel    ° IR IMAGING GUIDED PORT INSERTION  10/10/2021  ° MASS BIOPSY Left 07/14/2021  ° Procedure: NECK MASS BIOPSY;  Surgeon: [name], Dwight, MD;  Location: [facility] OR;   Service: ENT;  Laterality: Left;  ° ° °SOCIAL HISTORY: °Social History  ° °Socioeconomic History  ° Marital status: Widowed  °  Spouse name: Not on file  ° Number of children: Not on file  ° Years of education: Not on file  ° Highest education level: Not on file  °Occupational History  ° Not on file  °Tobacco Use  ° Smoking status: Former  °  Packs/day: 0.25  °  Types: Cigarettes  °  Quit date: 11/01/1986  °  Years since quitting: 35.0  ° Smokeless tobacco: Former  °  Types: Chew  °  Quit date: 11/28/1987  °Vaping Use  ° Vaping Use: Never used  °Substance and Sexual Activity  ° Alcohol use: Yes  °  Comment: rare  ° Drug use: No  ° Sexual activity: Not on file  °Other Topics Concern  ° Not on file  °Social History Narrative  ° Not on file  ° °Social Determinants of Health  ° °Financial Resource Strain: Not on file  °Food Insecurity: Not on file  °Transportation Needs: Not on file  °Physical Activity: Not on file  °Stress: Not on file  °Social Connections: Not on file  °Intimate Partner Violence: Not on file  ° ° °FAMILY HISTORY: °Family History  °Problem Relation Age of Onset  ° Healthy Mother   ° Healthy Father   ° ° °ALLERGIES:  has No Known Allergies. ° °MEDICATIONS:  °Current Outpatient   Medications  °Medication Sig Dispense Refill  ° apixaban (ELIQUIS) 5 MG TABS tablet Take 1 tablet (5 mg total) by mouth 2 (two) times daily. This replaces the Lovenox injections 60 tablet 3  ° lidocaine-prilocaine (EMLA) cream Apply 1 application topically as needed. 30-60 mins prior to port a cath access. Start using 2 weeks after port a cath placement. 30 g 0  ° acetaminophen (TYLENOL) 325 MG tablet Take 2 tablets (650 mg total) by mouth every 6 (six) hours as needed for mild pain (or Fever >/= 101). 20 tablet 0  ° albuterol (VENTOLIN HFA) 108 (90 Base) MCG/ACT inhaler Inhale 1-2 puffs into the lungs every 6 (six) hours as needed for wheezing or shortness of breath. 8 g 0  ° allopurinol (ZYLOPRIM) 300 MG tablet Take 300 mg by mouth  daily.    ° ALPRAZolam (XANAX) 1 MG tablet Take 1 mg by mouth 3 (three) times daily as needed.    ° amLODipine (NORVASC) 10 MG tablet Take 10 mg by mouth daily.    ° amoxicillin-clavulanate (AUGMENTIN) 875-125 MG tablet Take 1 tablet by mouth 2 times daily. 10 tablet 0  ° ergocalciferol (VITAMIN D2) 1.25 MG (50000 UT) capsule Take 1 capsule (50,000 Units total) by mouth once a week. (Patient taking differently: Take 50,000 Units by mouth once a week. on Wednesday) 12 capsule 0  ° feeding supplement (ENSURE ENLIVE / ENSURE PLUS) LIQD Take 237 mLs by mouth 2 (two) times daily between meals. 237 mL 12  ° ondansetron (ZOFRAN) 8 MG tablet Take 1 tablet (8 mg total) by mouth 2 (two) times daily as needed for refractory nausea / vomiting. Start on day 3 after cyclophosphamide chemotherapy. 30 tablet 1  ° predniSONE (DELTASONE) 20 MG tablet Take 3 tablets (60 mg total) by mouth daily. Take with food on days 2-6 of chemotherapy. (Patient taking differently: Take 60 mg by mouth See admin instructions. Take with food on days 2-6 of chemotherapy.) 25 tablet 5  ° prochlorperazine (COMPAZINE) 10 MG tablet Take 1 tablet (10 mg total) by mouth every 6 (six) hours as needed (Nausea or vomiting). 30 tablet 6  ° sertraline (ZOLOFT) 50 MG tablet Take 50 mg by mouth daily.    ° sodium chloride flush (NS) 0.9 % SOLN 10-40 mLs by Intracatheter route as needed (flush). 1000 mL 0  ° sodium chloride flush 0.9 % SOLN injection Inject 10-40mls as needed for intracatheter flush. 1000 mL 0  ° Sodium Chloride-Sodium Bicarb (SODIUM BICARBONATE/SODIUM CHLORIDE) SOLN 1 application by Mouth Rinse route as needed for dry mouth. 1000 mL 0  ° °No current facility-administered medications for this visit.  ° ° °REVIEW OF SYSTEMS:   °.10 Point review of Systems was done is negative except as noted above. ° °PHYSICAL EXAMINATION: °ECOG PERFORMANCE STATUS: 1 - Symptomatic but completely ambulatory ° °. °Vitals:  ° 10/18/21 0952  °BP: (!) 150/88  °Pulse: 94   °Resp: 17  °Temp: (!) 97.5 °F (36.4 °C)  °SpO2: 98%  ° °Filed Weights  ° 10/18/21 0952  °Weight: (!) 317 lb 8 oz (144 kg)  ° °.Body mass index is 46.89 kg/m². °.. °GENERAL:alert, in no acute distress and comfortable °SKIN: no acute rashes, no significant lesions °EYES: conjunctiva are pink and non-injected, sclera anicteric °OROPHARYNX: MMM, no exudates, no oropharyngeal erythema or ulceration °NECK: supple, no JVD °LYMPH:  no palpable lymphadenopathy in the cervical, axillary or inguinal regions °LUNGS: clear to auscultation b/l with normal respiratory effort °HEART: regular rate & rhythm °  ABDOMEN:  normoactive bowel sounds , non tender, not distended. Extremity: no pedal edema PSYCH: alert & oriented x 3 with fluent speech NEURO: no focal motor/sensory deficits   LABORATORY DATA:  I have reviewed the data as listed . CBC Latest Ref Rng & Units 10/18/2021 10/13/2021 09/27/2021  WBC 4.0 - 10.5 K/uL 9.4 7.7 7.7  Hemoglobin 13.0 - 17.0 g/dL 11.5(L) 11.7(L) 12.2(L)  Hematocrit 39.0 - 52.0 % 35.3(L) 35.0(L) 36.7(L)  Platelets 150 - 400 K/uL 456(H) 280 477(H)    . CMP Latest Ref Rng & Units 10/18/2021 10/13/2021 09/27/2021  Glucose 70 - 99 mg/dL 87 103(H) 98  BUN 8 - 23 mg/dL _0 Creatinine 0.61 - 1.24 mg/dL 1.01 0.94 0.98  Sodium 135 - 145 mmol/L 141 137 140  Potassium 3.5 - 5.1 mmol/L 3.4(L) 3.9 4.3  Chloride 98 - 111 mmol/L 104 101 104  CO2 22 - 32 mmol/L _1 Calcium 8.9 - 10.3 mg/dL 9.0 8.9 8.9  Total Protein 6.5 - 8.1 g/dL 6.8 7.0 7.0  Total Bilirubin 0.3 - 1.2 mg/dL 0.3 0.4 0.3  Alkaline Phos 38 - 126 U/L 54 68 70  AST 15 - 41 U/L _2 ALT 0 - 44 U/L 28 26 32     SURGICAL PATHOLOGY  CASE: [id number]  PATIENT: Joe [name]  Surgical Pathology Report   Clinical History: neck mass (cm)      FINAL MICROSCOPIC DIAGNOSIS:   A. SOFT TISSUE MASS, LEFT NECK, BIOPSY:  - Non-Hodgkin B-cell lymphoma, see comment.   COMMENT:   The majority of the fragments  have extensive crush artifact hampering  evaluation. One fragment is well preserved and demonstrates an atypical  lymphoid proliferation with predominately small irregular lymphocytes.  Immunohistochemistry is positive for CD20, bcl-2, bcl-6 and CD10. CD23  is largely negative. CyclinD1 and light chain in situ hybridization are  negative. Ki-67 is 20-25%. CD3 and CD5 highlight scattered T-cells. Flow  cytometry ([id number]) reveals predominately T-cells. Overall, the  findings are consistent with a non-Hodgkin B-cell lymphoma, specifically  follicular lymphoma. There is only limited preserved tissue hampering  grading, but this area appears low grade (grade 1-2 of 3). A higher  grade process in the crushed fragments cannot be excluded. Clinical  correlation is recommended. Dr. Redmond Baseman was attempted on 07/18/2021.   RADIOGRAPHIC STUDIES: I have personally reviewed the radiological images as listed and agreed with the findings in the report. CT Angio Chest PE W and/or Wo Contrast  Result Date: 10/13/2021 CLINICAL DATA:  Pulmonary embolism (PE) suspected, high prob EXAM: CT ANGIOGRAPHY CHEST WITH CONTRAST TECHNIQUE: Multidetector CT imaging of the chest was performed using the standard protocol during bolus administration of intravenous contrast. Multiplanar CT image reconstructions and MIPs were obtained to evaluate the vascular anatomy. CONTRAST:  90m OMNIPAQUE IOHEXOL 350 MG/ML SOLN COMPARISON:  August 15, 2021. FINDINGS: Cardiovascular: Evaluation is limited by a suboptimal contrast timing and motion. Within these limitations, no pulmonary embolism through the segmental pulmonary arteries. RIGHT chest port with tip terminating at the superior cavoatrial junction. No pericardial effusion. Mediastinum/Nodes: No new axillary or mediastinal adenopathy. Previously described LEFT neck mass consistent with non-Hodgkin lymphoma is markedly decreased in size and conspicuity. There is faint residual LEFT  supraclavicular fat stranding likely reflecting the sequela of treated lymphoma. Lunate configuration of the trachea. Lungs/Pleura: No pleural effusion or pneumothorax. Scattered bronchial wall thickening and endobronchial debris. Mild mosaic attenuation of the lungs which may be due to expiratory imaging.  There are new scattered pulmonary nodules with representative nodules as follows: Nodule 1: LEFT lower lobe ground-glass pulmonary nodule measures 6 x 5 mm (series 5, image 212). Nodule 2: RIGHT lower lobe paramediastinal ground-glass nodule measures 7 by 4 mm (series 5, image 221). Upper Abdomen: Hepatic steatosis. Musculoskeletal: Gynecomastia. Degenerative changes of the thoracic spine. Review of the MIP images confirms the above findings. IMPRESSION: 1. No evidence of pulmonary embolism through the proximal segmental pulmonary arteries. 2. 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 3. New scattered ground-glass pulmonary nodules, likely infectious or inflammatory in etiology. Recommend attention on follow-up as per oncologic clinical protocol. 4. Scattered areas of endobronchial debris and bronchial wall thickening, likely infectious or inflammatory in etiology. 5. Interval marked decrease in size and conspicuity of biopsy proven LEFT neck non-Hodgkin's lymphoma. Electronically Signed   By: Valentino Saxon M.D.   On: 10/13/2021 16:24   DG Chest Portable 1 View  Result Date: 10/13/2021 CLINICAL DATA:  Cough since Friday.  Syncopal episodes. EXAM: PORTABLE CHEST 1 VIEW COMPARISON:  08/15/2021 FINDINGS: Right Port-A-Cath tip mid SVC. Apical lordotic positioning. Midline trachea. Normal heart size. Left-sided neck mass is not readily apparent today. No pleural effusion or pneumothorax. Clear lungs. IMPRESSION: No acute cardiopulmonary disease. Electronically Signed   By: Abigail Miyamoto M.D.   On: 10/13/2021 14:27   IR IMAGING GUIDED PORT  INSERTION  Result Date: 10/10/2021 CLINICAL DATA:  High-grade follicular lymphoma, needs durable venous access for planned treatment regimen EXAM: TUNNELED PORT CATHETER PLACEMENT WITH ULTRASOUND AND FLUOROSCOPIC GUIDANCE FLUOROSCOPY TIME:  48 seconds; 7 mGy ANESTHESIA/SEDATION: Intravenous Fentanyl 15mg and Versed 265mwere administered as conscious sedation during continuous monitoring of the patient's level of consciousness and physiological / cardiorespiratory status by the radiology RN, with a total moderate sedation time of 20 minutes. TECHNIQUE: The procedure, risks, benefits, and alternatives were explained to the patient. Questions regarding the procedure were encouraged and answered. The patient understands and consents to the procedure. Patency of the right IJ vein was confirmed with ultrasound with image documentation. An appropriate skin site was determined. Skin site was marked. Region was prepped using maximum barrier technique including cap and mask, sterile gown, sterile gloves, large sterile sheet, and Chlorhexidine as cutaneous antisepsis. The region was infiltrated locally with 1% lidocaine. Under real-time ultrasound guidance, the right IJ vein was accessed with a 21 gauge micropuncture needle; the needle tip within the vein was confirmed with ultrasound image documentation. Needle was exchanged over a 018 guidewire for transitional dilator, and vascular measurement was performed. A small incision was made on the right anterior chest wall and a subcutaneous pocket fashioned. The power-injectable port was positioned and its catheter tunneled to the right IJ dermatotomy site. The transitional dilator was exchanged over an Amplatz wire for a peel-away sheath, through which the port catheter, which had been trimmed to the appropriate length, was advanced and positioned under fluoroscopy with its tip at the cavoatrial junction. Spot chest radiograph confirms good catheter position and no  pneumothorax. The port was flushed per protocol. The pocket was closed with deep interrupted and subcuticular continuous 3-0 Monocryl sutures. The incisions were covered with Dermabond then covered with a sterile dressing. The patient tolerated the procedure well. COMPLICATIONS: COMPLICATIONS None immediate IMPRESSION: Technically successful right IJ power-injectable port catheter placement. Ready for routine use. Electronically Signed   By: D Lucrezia Europe.D.   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 Based on limited sampling the patient's pathology reveals a grade 1 through 2 follicular lymphoma with a Ki-67 of 25%.  There was a lot of crush artifact in the sample and therefore a high-grade process could not be ruled out. However the predominant element of his mass has grown significantly over the last 1 month which is concerning for a high-grade follicular lymphoma or follicular lymphoma with transformation to large B-cell lymphoma.  CT neck imaging shows concern with compression of left internal jugular vein with mild mass-effect on the airway in the neck and the left carotid is inseparable from the mass.  High risk of syncopal episodes if there is compression of the common carotid baroreceptors and left vagus nerve  PET/CT 08/10/2021 showed  1. Examination is positive for large FDG avid mass within the left neck extending from the level 2 nodal chain into the left side of the superior mediastinum compatible with metabolically active tumor. 2. There are 2 foci of increased uptake within the neck and body of pancreas lymphomatous involvement of the pancreas. 3. Subcentimeter FDG avid right inguinal lymph node also concerning for lymphoma. 4. Several foci of increased uptake within the axial skeleton compatible with malignancy  2) HTN  3) Obstructive sleep  apnea not on CPAP machine.  This is causing sleep issues and possible cognitive issues.  4) Depression and anxiety  5) Acute pulmonary embolism due to lymphoma plus body habitus plus possible vascular compression - on lovenox  6) PICC line in situ PLAN -Patient tolerated third cycle of R-CHOP without any prohibitive toxicities. -Labs done today reviewed with the patient CBC, CMP stable -Port-A-Cath has been placed and PICC line has been removed. -Follow-up PET CT scan is scheduled for 10/25/2021. -Recent upper respiratory tract infection cannot rule out microaspiration but the patient does not have any overt clinical aspiration symptoms.  Was treated with cough suppressants and steroids.  Given prescription for Augmentin since she still has some resolving cough. -Recent CT of the chest with no PE we will transition from Lovenox to Eliquis.  Given new prescription for Eliquis -Emla prescription given -Continue management of sleep apnea per pulmonary.  Follow-up Please schedule cycle 5 of R-CHOP with port flush and labs and MD visit as ordered Please schedule cycle 6 of R-CHOP with port flush and labs and MD visit as ordered  All of the patients questions were answered with apparent satisfaction. The patient knows to call the clinic with any problems, questions or concerns.  . The total time spent in the appointment was 31 minutes reviewing his recent emergency room visit, evaluating and management of upper respiratory tract infection, ordering and management of R-CHOP chemotherapy.  Sullivan Lone MD [place] AAHIVMS [facility] Hematology/Oncology Physician [facility]

## 2021-10-25 ENCOUNTER — Ambulatory Visit (HOSPITAL_COMMUNITY)
Admission: RE | Admit: 2021-10-25 | Discharge: 2021-10-25 | Disposition: A | Discharge status: Home / Self Care | Payer: BC Managed Care – PPO | Source: Ambulatory Visit | Attending: Hematology | Admitting: Hematology

## 2021-10-25 ENCOUNTER — Other Ambulatory Visit: Payer: Self-pay | Admitting: Hematology

## 2021-10-25 ENCOUNTER — Telehealth: Payer: Self-pay | Admitting: Hematology

## 2021-10-25 DIAGNOSIS — C8221 Follicular lymphoma grade III, unspecified, lymph nodes of head, face, and neck: Secondary | ICD-10-CM | POA: Insufficient documentation

## 2021-10-25 DIAGNOSIS — K439 Ventral hernia without obstruction or gangrene: Secondary | ICD-10-CM | POA: Diagnosis not present

## 2021-10-25 DIAGNOSIS — R221 Localized swelling, mass and lump, neck: Secondary | ICD-10-CM | POA: Diagnosis not present

## 2021-10-25 DIAGNOSIS — C8290 Follicular lymphoma, unspecified, unspecified site: Secondary | ICD-10-CM | POA: Diagnosis not present

## 2021-10-25 DIAGNOSIS — K429 Umbilical hernia without obstruction or gangrene: Secondary | ICD-10-CM | POA: Diagnosis not present

## 2021-10-25 LAB — GLUCOSE, CAPILLARY: Glucose-Capillary: 90 mg/dL (ref 70–99)

## 2021-10-25 MED ORDER — FLUDEOXYGLUCOSE F - 18 (FDG) INJECTION
16.5000 | Freq: Once | INTRAVENOUS | Status: AC
  Administered 2021-10-25: 16.5 via INTRAVENOUS

## 2021-10-25 NOTE — Telephone Encounter (Signed)
Unable to leave message with follow-up appointments. Patient is Mychart active.

## 2021-10-26 DIAGNOSIS — J9621 Acute and chronic respiratory failure with hypoxia: Secondary | ICD-10-CM | POA: Diagnosis not present

## 2021-10-26 DIAGNOSIS — J9612 Chronic respiratory failure with hypercapnia: Secondary | ICD-10-CM | POA: Diagnosis not present

## 2021-10-30 ENCOUNTER — Encounter: Payer: Self-pay | Admitting: Hematology

## 2021-10-30 ENCOUNTER — Telehealth: Payer: Self-pay

## 2021-10-30 NOTE — Telephone Encounter (Signed)
Notified Patient of completion of form requested by [employer].Fax transmission confirmation received for form. Request for medical records forwarded to [place] with signed Release of Information Form. Copy of forms mailed to Patient as requested.

## 2021-11-09 ENCOUNTER — Inpatient Hospital Stay: Payer: BC Managed Care – PPO | Attending: Hematology | Admitting: Hematology

## 2021-11-09 ENCOUNTER — Other Ambulatory Visit: Payer: Self-pay

## 2021-11-09 ENCOUNTER — Inpatient Hospital Stay (HOSPITAL_BASED_OUTPATIENT_CLINIC_OR_DEPARTMENT_OTHER): Payer: BC Managed Care – PPO

## 2021-11-09 ENCOUNTER — Inpatient Hospital Stay: Payer: BC Managed Care – PPO

## 2021-11-09 VITALS — BP 109/65 | HR 90 | Temp 97.6°F | Resp 16

## 2021-11-09 VITALS — BP 149/95 | HR 78 | Temp 97.0°F | Resp 18 | Wt 314.5 lb

## 2021-11-09 DIAGNOSIS — Z79899 Other long term (current) drug therapy: Secondary | ICD-10-CM | POA: Diagnosis not present

## 2021-11-09 DIAGNOSIS — C8221 Follicular lymphoma grade III, unspecified, lymph nodes of head, face, and neck: Secondary | ICD-10-CM

## 2021-11-09 DIAGNOSIS — Z5111 Encounter for antineoplastic chemotherapy: Secondary | ICD-10-CM | POA: Diagnosis not present

## 2021-11-09 DIAGNOSIS — C8211 Follicular lymphoma grade II, lymph nodes of head, face, and neck: Secondary | ICD-10-CM | POA: Diagnosis not present

## 2021-11-09 DIAGNOSIS — Z7189 Other specified counseling: Secondary | ICD-10-CM

## 2021-11-09 DIAGNOSIS — Z452 Encounter for adjustment and management of vascular access device: Secondary | ICD-10-CM

## 2021-11-09 LAB — CBC WITH DIFFERENTIAL (CANCER CENTER ONLY)
Abs Immature Granulocytes: 0.03 10*3/uL (ref 0.00–0.07)
Basophils Absolute: 0.1 10*3/uL (ref 0.0–0.1)
Basophils Relative: 2 %
Eosinophils Absolute: 0.2 10*3/uL (ref 0.0–0.5)
Eosinophils Relative: 3 %
HCT: 36.2 % — ABNORMAL LOW (ref 39.0–52.0)
Hemoglobin: 12 g/dL — ABNORMAL LOW (ref 13.0–17.0)
Immature Granulocytes: 0 %
Lymphocytes Relative: 14 %
Lymphs Abs: 1 10*3/uL (ref 0.7–4.0)
MCH: 31.2 pg (ref 26.0–34.0)
MCHC: 33.1 g/dL (ref 30.0–36.0)
MCV: 94 fL (ref 80.0–100.0)
Monocytes Absolute: 1.1 10*3/uL — ABNORMAL HIGH (ref 0.1–1.0)
Monocytes Relative: 15 %
Neutro Abs: 4.8 10*3/uL (ref 1.7–7.7)
Neutrophils Relative %: 66 %
Platelet Count: 381 10*3/uL (ref 150–400)
RBC: 3.85 MIL/uL — ABNORMAL LOW (ref 4.22–5.81)
RDW: 18.8 % — ABNORMAL HIGH (ref 11.5–15.5)
WBC Count: 7.3 10*3/uL (ref 4.0–10.5)
nRBC: 0 % (ref 0.0–0.2)

## 2021-11-09 LAB — CMP (CANCER CENTER ONLY)
ALT: 23 U/L (ref 0–44)
AST: 17 U/L (ref 15–41)
Albumin: 4 g/dL (ref 3.5–5.0)
Alkaline Phosphatase: 58 U/L (ref 38–126)
Anion gap: 7 (ref 5–15)
BUN: 11 mg/dL (ref 8–23)
CO2: 30 mmol/L (ref 22–32)
Calcium: 9.5 mg/dL (ref 8.9–10.3)
Chloride: 103 mmol/L (ref 98–111)
Creatinine: 1.08 mg/dL (ref 0.61–1.24)
GFR, Estimated: >60 mL/min (ref >60)
Glucose, Bld: 116 mg/dL — ABNORMAL HIGH (ref 70–99)
Potassium: 4 mmol/L (ref 3.5–5.1)
Sodium: 140 mmol/L (ref 135–145)
Total Bilirubin: 0.3 mg/dL (ref 0.3–1.2)
Total Protein: 6.8 g/dL (ref 6.5–8.1)

## 2021-11-09 LAB — LACTATE DEHYDROGENASE: LDH: 164 U/L (ref 98–192)

## 2021-11-09 MED ORDER — SODIUM CHLORIDE 0.9 % IV SOLN
375.0000 mg/m2 | Freq: Once | INTRAVENOUS | Status: AC
  Administered 2021-11-09: 1000 mg via INTRAVENOUS
  Filled 2021-11-09: qty 100

## 2021-11-09 MED ORDER — DIPHENHYDRAMINE HCL 25 MG PO CAPS
50.0000 mg | ORAL_CAPSULE | Freq: Once | ORAL | Status: AC
  Administered 2021-11-09: 50 mg via ORAL
  Filled 2021-11-09: qty 2

## 2021-11-09 MED ORDER — SODIUM CHLORIDE 0.9 % IV SOLN
10.0000 mg | Freq: Once | INTRAVENOUS | Status: AC
  Administered 2021-11-09: 10 mg via INTRAVENOUS
  Filled 2021-11-09: qty 10

## 2021-11-09 MED ORDER — SODIUM CHLORIDE 0.9 % IV SOLN: Freq: Once | INTRAVENOUS | Status: AC

## 2021-11-09 MED ORDER — HEPARIN SOD (PORK) LOCK FLUSH 100 UNIT/ML IV SOLN
500.0000 [IU] | Freq: Once | INTRAVENOUS | Status: AC | PRN
  Administered 2021-11-09: 500 [IU]

## 2021-11-09 MED ORDER — VINCRISTINE SULFATE CHEMO INJECTION 1 MG/ML
2.0000 mg | Freq: Once | INTRAVENOUS | Status: AC
  Administered 2021-11-09: 2 mg via INTRAVENOUS
  Filled 2021-11-09: qty 2

## 2021-11-09 MED ORDER — SODIUM CHLORIDE 0.9 % IV SOLN
750.0000 mg/m2 | Freq: Once | INTRAVENOUS | Status: AC
  Administered 2021-11-09: 2000 mg via INTRAVENOUS
  Filled 2021-11-09: qty 100

## 2021-11-09 MED ORDER — PALONOSETRON HCL INJECTION 0.25 MG/5ML
0.2500 mg | Freq: Once | INTRAVENOUS | Status: AC
  Administered 2021-11-09: 0.25 mg via INTRAVENOUS
  Filled 2021-11-09: qty 5

## 2021-11-09 MED ORDER — SODIUM CHLORIDE 0.9% FLUSH
10.0000 mL | INTRAVENOUS | Status: DC | PRN
  Administered 2021-11-09: 10 mL

## 2021-11-09 MED ORDER — DOXORUBICIN HCL CHEMO IV INJECTION 2 MG/ML
50.0000 mg/m2 | Freq: Once | INTRAVENOUS | Status: AC
  Administered 2021-11-09: 134 mg via INTRAVENOUS
  Filled 2021-11-09: qty 67

## 2021-11-09 MED ORDER — ACETAMINOPHEN 325 MG PO TABS
650.0000 mg | ORAL_TABLET | Freq: Once | ORAL | Status: AC
  Administered 2021-11-09: 650 mg via ORAL
  Filled 2021-11-09: qty 2

## 2021-11-09 MED ORDER — SODIUM CHLORIDE 0.9 % IV SOLN
150.0000 mg | Freq: Once | INTRAVENOUS | Status: AC
  Administered 2021-11-09: 150 mg via INTRAVENOUS
  Filled 2021-11-09: qty 150

## 2021-11-09 MED ORDER — SODIUM CHLORIDE 0.9% FLUSH
10.0000 mL | Freq: Once | INTRAVENOUS | Status: AC
  Administered 2021-11-09: 10 mL

## 2021-11-09 NOTE — Patient Instructions (Signed)
[facility]  Discharge Instructions: Thank you for choosing [facility] to provide your oncology and hematology care.   If you have a lab appointment with the [facility], please go directly to the [facility] and check in at the registration area.   Wear comfortable clothing and clothing appropriate for easy access to any Portacath or PICC line.   We strive to give you quality time with your provider. You may need to reschedule your appointment if you arrive late (15 or more minutes).  Arriving late affects you and other patients whose appointments are after yours.  Also, if you miss three or more appointments without notifying the office, you may be dismissed from the clinic at the providers discretion.      For prescription refill requests, have your pharmacy contact our office and allow 72 hours for refills to be completed.    Today you received the following chemotherapy and/or immunotherapy agents: Adriamycin, Oncovin, Cytoxan, & Rituxan   To help prevent nausea and vomiting after your treatment, we encourage you to take your nausea medication as directed.  BELOW ARE SYMPTOMS THAT SHOULD BE REPORTED IMMEDIATELY: *FEVER GREATER THAN 100.4 F (38 C) OR HIGHER *CHILLS OR SWEATING *NAUSEA AND VOMITING THAT IS NOT CONTROLLED WITH YOUR NAUSEA MEDICATION *UNUSUAL SHORTNESS OF BREATH *UNUSUAL BRUISING OR BLEEDING *URINARY PROBLEMS (pain or burning when urinating, or frequent urination) *BOWEL PROBLEMS (unusual diarrhea, constipation, pain near the anus) TENDERNESS IN MOUTH AND THROAT WITH OR WITHOUT PRESENCE OF ULCERS (sore throat, sores in mouth, or a toothache) UNUSUAL RASH, SWELLING OR PAIN  UNUSUAL VAGINAL DISCHARGE OR ITCHING   Items with * indicate a potential emergency and should be followed up as soon as possible or go to the Emergency Department if any problems should occur.  Please show the CHEMOTHERAPY ALERT CARD or IMMUNOTHERAPY  ALERT CARD at check-in to the Emergency Department and triage nurse.  Should you have questions after your visit or need to cancel or reschedule your appointment, please contact [facility]  Dept: [phone]  and follow the prompts.  Office hours are 8:00 a.m. to 4:30 p.m. Monday - Friday. Please note that voicemails left after 4:00 p.m. may not be returned until the following business day.  We are closed weekends and major holidays. You have access to a nurse at all times for urgent questions. Please call the main number to the clinic Dept: [phone] and follow the prompts.   For any non-urgent questions, you may also contact your provider using MyChart. We now offer e-Visits for anyone 2 and older to request care online for non-urgent symptoms. For details visit [email or web address].   Also download the MyChart app! Go to the app store, search "MyChart", open the app, select [facility], and log in with your MyChart username and password.  Due to Covid, a mask is required upon entering the hospital/clinic. If you do not have a mask, one will be given to you upon arrival. For doctor visits, patients may have 1 support person aged 76 or older with them. For treatment visits, patients cannot have anyone with them due to current Covid guidelines and our immunocompromised population.

## 2021-11-12 ENCOUNTER — Inpatient Hospital Stay: Payer: BC Managed Care – PPO

## 2021-11-12 ENCOUNTER — Other Ambulatory Visit: Payer: Self-pay

## 2021-11-12 VITALS — BP 141/84 | HR 100 | Temp 98.2°F | Resp 19

## 2021-11-12 DIAGNOSIS — Z5111 Encounter for antineoplastic chemotherapy: Secondary | ICD-10-CM | POA: Diagnosis not present

## 2021-11-12 DIAGNOSIS — Z79899 Other long term (current) drug therapy: Secondary | ICD-10-CM | POA: Diagnosis not present

## 2021-11-12 DIAGNOSIS — Z7189 Other specified counseling: Secondary | ICD-10-CM

## 2021-11-12 DIAGNOSIS — C8221 Follicular lymphoma grade III, unspecified, lymph nodes of head, face, and neck: Secondary | ICD-10-CM

## 2021-11-12 DIAGNOSIS — C8211 Follicular lymphoma grade II, lymph nodes of head, face, and neck: Secondary | ICD-10-CM | POA: Diagnosis not present

## 2021-11-12 MED ORDER — PEGFILGRASTIM-CBQV 6 MG/0.6ML ~~LOC~~ SOSY
6.0000 mg | PREFILLED_SYRINGE | Freq: Once | SUBCUTANEOUS | Status: AC
  Administered 2021-11-12: 6 mg via SUBCUTANEOUS
  Filled 2021-11-12: qty 0.6

## 2021-11-15 ENCOUNTER — Encounter: Payer: Self-pay | Admitting: Hematology

## 2021-11-15 NOTE — Progress Notes (Addendum)
[name]   HEMATOLOGY/ONCOLOGY CLINIC NOTE  Date of Service: .11/09/2021   Patient Care Team: Celene Squibb, MD as PCP - General (Internal Medicine)  CHIEF COMPLAINTS/PURPOSE OF CONSULTATION:   Follow-up for cycle 5 of R-CHOP chemotherapy for high-grade follicular lymphoma  HISTORY OF PRESENTING ILLNESS:  Please see previous notes for details on initial presentation  INTERVAL HISTORY  [name] is here for follow-up of his high-grade follicular lymphoma for follow-up prior to his 5th cycle of R-CHOP chemotherapy. He notes no acute new symptoms.  Minimal grade 1 tingling in his lower extremities which resolves between treatments. No fevers no chills no night sweats. No presyncopal or syncopal episodes. No chest pain or shortness of breath. No reported toxicities from chemotherapy.  Labs done today reviewed with him in detail.  MEDICAL HISTORY:  Past Medical History:  Diagnosis Date   Anxiety    Arthritis    COVID    x 2 (2020 and 2022)   Depression    Follicular lymphoma ([place])    Hypertension    Left leg DVT ([place]) 07/2021   Pulmonary embolism ([place]) 07/2021    SURGICAL HISTORY: Past Surgical History:  Procedure Laterality Date   carpel tunnel     IR IMAGING GUIDED PORT INSERTION  10/10/2021   MASS BIOPSY Left 07/14/2021   Procedure: NECK MASS BIOPSY;  Surgeon: Melida Quitter, MD;  Location: [facility] OR;  Service: ENT;  Laterality: Left;    SOCIAL HISTORY: Social History   Socioeconomic History   Marital status: Widowed    Spouse name: Not on file   Number of children: Not on file   Years of education: Not on file   Highest education level: Not on file  Occupational History   Not on file  Tobacco Use   Smoking status: Former    Packs/day: 0.25    Types: Cigarettes    Quit date: 11/01/1986    Years since quitting: 35.0   Smokeless tobacco: Former    Types: Chew    Quit date: 11/28/1987  Vaping Use   Vaping Use: Never used  Substance and Sexual Activity    Alcohol use: Yes    Comment: rare   Drug use: No   Sexual activity: Not on file  Other Topics Concern   Not on file  Social History Narrative   Not on file   Social Determinants of Health   Financial Resource Strain: Not on file  Food Insecurity: Not on file  Transportation Needs: Not on file  Physical Activity: Not on file  Stress: Not on file  Social Connections: Not on file  Intimate Partner Violence: Not on file    FAMILY HISTORY: Family History  Problem Relation Age of Onset   Healthy Mother    Healthy Father     ALLERGIES:  has No Known Allergies.  MEDICATIONS:  Current Outpatient Medications  Medication Sig Dispense Refill   acetaminophen (TYLENOL) 325 MG tablet Take 2 tablets (650 mg total) by mouth every 6 (six) hours as needed for mild pain (or Fever >/= 101). 20 tablet 0   albuterol (VENTOLIN HFA) 108 (90 Base) MCG/ACT inhaler Inhale 1-2 puffs into the lungs every 6 (six) hours as needed for wheezing or shortness of breath. 8 g 0   allopurinol (ZYLOPRIM) 300 MG tablet Take 300 mg by mouth daily.     ALPRAZolam (XANAX) 1 MG tablet Take 1 mg by mouth 3 (three) times daily as needed.     amLODipine (NORVASC)  10 MG tablet Take 10 mg by mouth daily.     amoxicillin-clavulanate (AUGMENTIN) 875-125 MG tablet Take 1 tablet by mouth 2 times daily. 10 tablet 0   apixaban (ELIQUIS) 5 MG TABS tablet Take 1 tablet (5 mg total) by mouth 2 (two) times daily. This replaces the Lovenox injections 60 tablet 3   feeding supplement (ENSURE ENLIVE / ENSURE PLUS) LIQD Take 237 mLs by mouth 2 (two) times daily between meals. 237 mL 12   lidocaine-prilocaine (EMLA) cream Apply 1 application topically as needed. 30-60 mins prior to port a cath access. Start using 2 weeks after port a cath placement. 30 g 0   ondansetron (ZOFRAN) 8 MG tablet Take 1 tablet (8 mg total) by mouth 2 (two) times daily as needed for refractory nausea / vomiting. Start on day 3 after cyclophosphamide chemotherapy.  30 tablet 1   predniSONE (DELTASONE) 20 MG tablet Take 3 tablets (60 mg total) by mouth daily. Take with food on days 2-6 of chemotherapy. (Patient taking differently: Take 60 mg by mouth See admin instructions. Take with food on days 2-6 of chemotherapy.) 25 tablet 5   prochlorperazine (COMPAZINE) 10 MG tablet Take 1 tablet (10 mg total) by mouth every 6 (six) hours as needed (Nausea or vomiting). 30 tablet 6   sertraline (ZOLOFT) 50 MG tablet Take 50 mg by mouth daily.     sodium chloride flush (NS) 0.9 % SOLN 10-40 mLs by Intracatheter route as needed (flush). 1000 mL 0   sodium chloride flush 0.9 % SOLN injection Inject 10-15ms as needed for intracatheter flush. 1000 mL 0   Sodium Chloride-Sodium Bicarb (SODIUM BICARBONATE/SODIUM CHLORIDE) SOLN 1 application by Mouth Rinse route as needed for dry mouth. 1000 mL 0   Vitamin D, Ergocalciferol, (DRISDOL) 1.25 MG (50000 UNIT) CAPS capsule TAKE ONE CAPSULE BY MOUTH ONCE a WEEK 12 capsule 0   No current facility-administered medications for this visit.    REVIEW OF SYSTEMS:   .10 Point review of Systems was done is negative except as noted above.  PHYSICAL EXAMINATION: ECOG PERFORMANCE STATUS: 1 - Symptomatic but completely ambulatory  . Vitals:   11/09/21 0900  BP: (!) 149/95  Pulse: 78  Resp: 18  Temp: (!) 97 F (36.1 C)  SpO2: 95%   Filed Weights   11/09/21 0900  Weight: (!) 314 lb 8 oz (142.7 kg)   Body mass index is 46.44 kg/m.  GENERAL:alert, in no acute distress and comfortable SKIN: no acute rashes, no significant lesions EYES: conjunctiva are pink and non-injected, sclera anicteric OROPHARYNX: MMM, no exudates, no oropharyngeal erythema or ulceration NECK: supple, no JVD LYMPH:  no palpable lymphadenopathy in the cervical, axillary or inguinal regions LUNGS: clear to auscultation b/l with normal respiratory effort HEART: regular rate & rhythm ABDOMEN:  normoactive bowel sounds , non tender, not  distended. Extremity: no pedal edema PSYCH: alert & oriented x 3 with fluent speech NEURO: no focal motor/sensory deficits  LABORATORY DATA:  I have reviewed the data as listed . CBC Latest Ref Rng & Units 11/09/2021 10/18/2021 10/13/2021  WBC 4.0 - 10.5 K/uL 7.3 9.4 7.7  Hemoglobin 13.0 - 17.0 g/dL 12.0(L) 11.5(L) 11.7(L)  Hematocrit 39.0 - 52.0 % 36.2(L) 35.3(L) 35.0(L)  Platelets 150 - 400 K/uL 381 456(H) 280    . CMP Latest Ref Rng & Units 11/09/2021 10/18/2021 10/13/2021  Glucose 70 - 99 mg/dL 116(H) 87 103(H)  BUN 8 - 23 mg/dL _0 Creatinine 0.61 - 1.24  mg/dL 1.08 1.01 0.94  Sodium 135 - 145 mmol/L 140 141 137  Potassium 3.5 - 5.1 mmol/L 4.0 3.4(L) 3.9  Chloride 98 - 111 mmol/L 103 104 101  CO2 22 - 32 mmol/L _0 Calcium 8.9 - 10.3 mg/dL 9.5 9.0 8.9  Total Protein 6.5 - 8.1 g/dL 6.8 6.8 7.0  Total Bilirubin 0.3 - 1.2 mg/dL 0.3 0.3 0.4  Alkaline Phos 38 - 126 U/L 58 54 68  AST 15 - 41 U/L _1 ALT 0 - 44 U/L _2 SURGICAL PATHOLOGY  CASE: [id number]  PATIENT: Leandrew Chretien  Surgical Pathology Report   Clinical History: neck mass (cm)   FINAL MICROSCOPIC DIAGNOSIS:   A. SOFT TISSUE MASS, LEFT NECK, BIOPSY:  - Non-Hodgkin B-cell lymphoma, see comment.   COMMENT:   The majority of the fragments have extensive crush artifact hampering  evaluation. One fragment is well preserved and demonstrates an atypical  lymphoid proliferation with predominately small irregular lymphocytes.  Immunohistochemistry is positive for CD20, bcl-2, bcl-6 and CD10. CD23  is largely negative. CyclinD1 and light chain in situ hybridization are  negative. Ki-67 is 20-25%. CD3 and CD5 highlight scattered T-cells. Flow  cytometry ([id number]) reveals predominately T-cells. Overall, the  findings are consistent with a non-Hodgkin B-cell lymphoma, specifically  follicular lymphoma. There is only limited preserved tissue hampering  grading, but this area appears low  grade (grade 1-2 of 3). A higher  grade process in the crushed fragments cannot be excluded. Clinical  correlation is recommended. Dr. Redmond Baseman was attempted on 07/18/2021.   RADIOGRAPHIC STUDIES: I have personally reviewed the radiological images as listed and agreed with the findings in the report.  NM PET Image Restag (PS) Skull Base To Thigh  Result Date: 10/26/2021 CLINICAL DATA:  Subsequent treatment strategy for high-grade follicular lymphoma. EXAM: NUCLEAR MEDICINE PET SKULL BASE TO THIGH TECHNIQUE: 16.5 mCi F-18 FDG was injected intravenously. Full-ring PET imaging was performed from the skull base to thigh after the radiotracer. CT data was obtained and used for attenuation correction and anatomic localization. Fasting blood glucose: 90 mg/dl COMPARISON:  PET-CT 08/10/2021 FINDINGS: Mediastinal blood pool activity: SUV max 2.94 Liver activity: SUV max 5.07 NECK: It marked interval appearance of the large nodal mass that involves the entire left neck and supraclavicular region. This nodal mass has near completely resolved without any significant measurable disease. Wispy treated soft tissue density is noted. Minimal residual soft tissue density is noted deep to the left sternocleidomastoid muscle on image 32/4 measuring a maximum of 14 mm. SUV max is 4.84. Deauville 3. Incidental CT findings: none CHEST: No enlarged or hypermetabolic mediastinal or hilar lymph nodes. No supraclavicular or axillary adenopathy. On the prior PET-CT there was a 9 mm mildly hypermetabolic subcarinal lymph node on the right side. This now measures 5.5 mm and does not demonstrate any FDG uptake. Deauville 1. No pulmonary nodules. Incidental CT findings: The Port-A-Cath is in good position. ABDOMEN/PELVIS: The hypermetabolic lymph nodes near the pancreatic neck and body have resolved. Deauville 1. Bilateral small hypermetabolic inguinal lymph nodes have resolved. Deauville 1. Incidental CT findings: Stable large periumbilical  abdominal hernia containing fat. SKELETON: Several scattered hypermetabolic bone lesions are seen on the prior study. These are no longer hypermetabolic but there is now diffuse hypermetabolism throughout the osseous structures due to rebound from chemotherapy marrow stimulating drugs. Incidental CT findings: none IMPRESSION: 1. PET-CT shows excellent response to treatment as detailed above. Deauville  3 in the left neck (but only a very small area of residual hypermetabolism). Deauville 1 elsewhere. 2. Diffuse marrow uptake likely due to rebound from chemotherapy or marrow stimulating drugs. Electronically Signed   By: Marijo Sanes M.D.   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 Based on limited sampling the patient's pathology reveals a grade 1 through 2 follicular lymphoma with a Ki-67 of 25%.  There was a lot of crush artifact in the sample and therefore a high-grade process could not be ruled out. However the predominant element of his mass has grown significantly over the last 1 month which is concerning for a high-grade follicular lymphoma or follicular lymphoma with transformation to large B-cell lymphoma.  CT neck imaging shows concern with compression of left internal jugular vein with mild mass-effect on the airway in the neck and the left carotid is inseparable from the mass.  High risk of syncopal episodes if there is compression of the common carotid baroreceptors and left vagus nerve  PET/CT 08/10/2021 showed  1. Examination is positive for large FDG avid mass within the left neck extending from the level 2 nodal chain into the left side of the superior mediastinum compatible with metabolically active tumor. 2. There are 2 foci of increased uptake within the neck and body of pancreas lymphomatous involvement of the pancreas. 3. Subcentimeter FDG avid  right inguinal lymph node also concerning for lymphoma. 4. Several foci of increased uptake within the axial skeleton compatible with malignancy  2) HTN  3) Obstructive sleep apnea not on CPAP machine.  This is causing sleep issues and possible cognitive issues.  4) Depression and anxiety  5) Acute pulmonary embolism due to lymphoma plus body habitus plus possible vascular compression - on lovenox  6) PICC line in situ PLAN -Patient notes no further toxicities from his fourth cycle of R-CHOP chemotherapy. -Labs done today reviewed with the patient as noted above. -Patient is appropriate to proceed with cycle 5 of R-CHOP chemotherapy with same doses and same supportive medications. -Chemotherapy orders were reviewed and signed. -Continue current supportive medications for nausea and vomiting control. -Management of sleep apnea per pulmonary. -No other prohibitive toxicities noted from current chemotherapy.  Follow-up Please follow-up for cycle 6 of R-CHOP chemotherapy as scheduled in 3 weeks  All of the patients questions were answered with apparent satisfaction. The patient knows to call the clinic with any problems, questions or concerns.  . The total time spent in the appointment was 31 minutes for chart and lab review, toxicity check review ordering and management of R-CHOP chemotherapy and documentation.   Sullivan Lone MD MS AAHIVMS [facility] Hematology/Oncology Physician [facility] .[name]

## 2021-11-19 DIAGNOSIS — G4733 Obstructive sleep apnea (adult) (pediatric): Secondary | ICD-10-CM | POA: Diagnosis not present

## 2021-11-26 ENCOUNTER — Encounter: Payer: 59 | Admitting: Pulmonary Disease

## 2021-11-26 DIAGNOSIS — J9612 Chronic respiratory failure with hypercapnia: Secondary | ICD-10-CM | POA: Diagnosis not present

## 2021-11-26 DIAGNOSIS — J9621 Acute and chronic respiratory failure with hypoxia: Secondary | ICD-10-CM | POA: Diagnosis not present

## 2021-11-29 ENCOUNTER — Inpatient Hospital Stay: Payer: BC Managed Care – PPO | Admitting: Dietician

## 2021-11-29 ENCOUNTER — Inpatient Hospital Stay (HOSPITAL_BASED_OUTPATIENT_CLINIC_OR_DEPARTMENT_OTHER): Payer: BC Managed Care – PPO

## 2021-11-29 ENCOUNTER — Inpatient Hospital Stay (HOSPITAL_BASED_OUTPATIENT_CLINIC_OR_DEPARTMENT_OTHER): Payer: BC Managed Care – PPO | Admitting: Hematology

## 2021-11-29 ENCOUNTER — Inpatient Hospital Stay: Payer: BC Managed Care – PPO | Attending: Hematology

## 2021-11-29 ENCOUNTER — Other Ambulatory Visit: Payer: Self-pay

## 2021-11-29 VITALS — BP 144/85 | HR 107 | Temp 97.5°F | Resp 18 | Wt 313.3 lb

## 2021-11-29 VITALS — BP 119/67 | HR 93 | Temp 97.6°F | Resp 16

## 2021-11-29 DIAGNOSIS — Z7189 Other specified counseling: Secondary | ICD-10-CM

## 2021-11-29 DIAGNOSIS — C8221 Follicular lymphoma grade III, unspecified, lymph nodes of head, face, and neck: Secondary | ICD-10-CM

## 2021-11-29 DIAGNOSIS — Z79899 Other long term (current) drug therapy: Secondary | ICD-10-CM | POA: Insufficient documentation

## 2021-11-29 DIAGNOSIS — Z5111 Encounter for antineoplastic chemotherapy: Secondary | ICD-10-CM | POA: Insufficient documentation

## 2021-11-29 DIAGNOSIS — Z452 Encounter for adjustment and management of vascular access device: Secondary | ICD-10-CM

## 2021-11-29 DIAGNOSIS — C8211 Follicular lymphoma grade II, lymph nodes of head, face, and neck: Secondary | ICD-10-CM | POA: Insufficient documentation

## 2021-11-29 LAB — CBC WITH DIFFERENTIAL (CANCER CENTER ONLY)
Abs Immature Granulocytes: 0.02 10*3/uL (ref 0.00–0.07)
Basophils Absolute: 0.1 10*3/uL (ref 0.0–0.1)
Basophils Relative: 2 %
Eosinophils Absolute: 0.2 10*3/uL (ref 0.0–0.5)
Eosinophils Relative: 4 %
HCT: 32.4 % — ABNORMAL LOW (ref 39.0–52.0)
Hemoglobin: 11 g/dL — ABNORMAL LOW (ref 13.0–17.0)
Immature Granulocytes: 0 %
Lymphocytes Relative: 19 %
Lymphs Abs: 0.9 10*3/uL (ref 0.7–4.0)
MCH: 31.9 pg (ref 26.0–34.0)
MCHC: 34 g/dL (ref 30.0–36.0)
MCV: 93.9 fL (ref 80.0–100.0)
Monocytes Absolute: 0.9 10*3/uL (ref 0.1–1.0)
Monocytes Relative: 19 %
Neutro Abs: 2.7 10*3/uL (ref 1.7–7.7)
Neutrophils Relative %: 56 %
Platelet Count: 362 10*3/uL (ref 150–400)
RBC: 3.45 MIL/uL — ABNORMAL LOW (ref 4.22–5.81)
RDW: 18.4 % — ABNORMAL HIGH (ref 11.5–15.5)
WBC Count: 4.9 10*3/uL (ref 4.0–10.5)
nRBC: 0.4 % — ABNORMAL HIGH (ref 0.0–0.2)

## 2021-11-29 LAB — LACTATE DEHYDROGENASE: LDH: 151 U/L (ref 98–192)

## 2021-11-29 LAB — CMP (CANCER CENTER ONLY)
ALT: 22 U/L (ref 0–44)
AST: 18 U/L (ref 15–41)
Albumin: 4 g/dL (ref 3.5–5.0)
Alkaline Phosphatase: 59 U/L (ref 38–126)
Anion gap: 5 (ref 5–15)
BUN: 14 mg/dL (ref 8–23)
CO2: 31 mmol/L (ref 22–32)
Calcium: 9.3 mg/dL (ref 8.9–10.3)
Chloride: 104 mmol/L (ref 98–111)
Creatinine: 1.11 mg/dL (ref 0.61–1.24)
GFR, Estimated: >60 mL/min (ref >60)
Glucose, Bld: 116 mg/dL — ABNORMAL HIGH (ref 70–99)
Potassium: 3.8 mmol/L (ref 3.5–5.1)
Sodium: 140 mmol/L (ref 135–145)
Total Bilirubin: 0.4 mg/dL (ref 0.3–1.2)
Total Protein: 6.8 g/dL (ref 6.5–8.1)

## 2021-11-29 MED ORDER — SODIUM CHLORIDE 0.9 % IV SOLN
150.0000 mg | Freq: Once | INTRAVENOUS | Status: AC
  Administered 2021-11-29: 150 mg via INTRAVENOUS
  Filled 2021-11-29: qty 150

## 2021-11-29 MED ORDER — SODIUM CHLORIDE 0.9 % IV SOLN: Freq: Once | INTRAVENOUS | Status: AC

## 2021-11-29 MED ORDER — HEPARIN SOD (PORK) LOCK FLUSH 100 UNIT/ML IV SOLN
500.0000 [IU] | Freq: Once | INTRAVENOUS | Status: AC | PRN
  Administered 2021-11-29: 500 [IU]

## 2021-11-29 MED ORDER — VINCRISTINE SULFATE CHEMO INJECTION 1 MG/ML
2.0000 mg | Freq: Once | INTRAVENOUS | Status: AC
  Administered 2021-11-29: 2 mg via INTRAVENOUS
  Filled 2021-11-29: qty 2

## 2021-11-29 MED ORDER — SODIUM CHLORIDE 0.9% FLUSH
10.0000 mL | INTRAVENOUS | Status: DC | PRN
  Administered 2021-11-29: 10 mL

## 2021-11-29 MED ORDER — SODIUM CHLORIDE 0.9% FLUSH
10.0000 mL | Freq: Once | INTRAVENOUS | Status: AC
  Administered 2021-11-29: 10 mL

## 2021-11-29 MED ORDER — ACETAMINOPHEN 325 MG PO TABS
650.0000 mg | ORAL_TABLET | Freq: Once | ORAL | Status: AC
  Administered 2021-11-29: 650 mg via ORAL
  Filled 2021-11-29: qty 2

## 2021-11-29 MED ORDER — SODIUM CHLORIDE 0.9 % IV SOLN
375.0000 mg/m2 | Freq: Once | INTRAVENOUS | Status: AC
  Administered 2021-11-29: 1000 mg via INTRAVENOUS
  Filled 2021-11-29: qty 100

## 2021-11-29 MED ORDER — DOXORUBICIN HCL CHEMO IV INJECTION 2 MG/ML
50.0000 mg/m2 | Freq: Once | INTRAVENOUS | Status: AC
  Administered 2021-11-29: 134 mg via INTRAVENOUS
  Filled 2021-11-29: qty 67

## 2021-11-29 MED ORDER — SODIUM CHLORIDE 0.9 % IV SOLN
10.0000 mg | Freq: Once | INTRAVENOUS | Status: AC
  Administered 2021-11-29: 10 mg via INTRAVENOUS
  Filled 2021-11-29: qty 10

## 2021-11-29 MED ORDER — PALONOSETRON HCL INJECTION 0.25 MG/5ML
0.2500 mg | Freq: Once | INTRAVENOUS | Status: AC
  Administered 2021-11-29: 0.25 mg via INTRAVENOUS
  Filled 2021-11-29: qty 5

## 2021-11-29 MED ORDER — SODIUM CHLORIDE 0.9 % IV SOLN
750.0000 mg/m2 | Freq: Once | INTRAVENOUS | Status: AC
  Administered 2021-11-29: 2000 mg via INTRAVENOUS
  Filled 2021-11-29: qty 100

## 2021-11-29 MED ORDER — DIPHENHYDRAMINE HCL 25 MG PO CAPS
50.0000 mg | ORAL_CAPSULE | Freq: Once | ORAL | Status: AC
  Administered 2021-11-29: 50 mg via ORAL
  Filled 2021-11-29: qty 2

## 2021-11-29 NOTE — Patient Instructions (Addendum)
[facility] ONCOLOGY  Discharge Instructions: Thank you for choosing [facility] to provide your oncology and hematology care.   If you have a lab appointment with the [facility], please go directly to the [facility] and check in at the registration area.   Wear comfortable clothing and clothing appropriate for easy access to any Portacath or PICC line.   We strive to give you quality time with your provider. You may need to reschedule your appointment if you arrive late (15 or more minutes).  Arriving late affects you and other patients whose appointments are after yours.  Also, if you miss three or more appointments without notifying the office, you may be dismissed from the clinic at the providers discretion.      For prescription refill requests, have your pharmacy contact our office and allow 72 hours for refills to be completed.    Today you received the following chemotherapy and/or immunotherapy agents: R-CHOP (Doxorubicin, Cytoxan, Rituxan, Vincristine)   To help prevent nausea and vomiting after your treatment, we encourage you to take your nausea medication as directed.  BELOW ARE SYMPTOMS THAT SHOULD BE REPORTED IMMEDIATELY: *FEVER GREATER THAN 100.4 F (38 C) OR HIGHER *CHILLS OR SWEATING *NAUSEA AND VOMITING THAT IS NOT CONTROLLED WITH YOUR NAUSEA MEDICATION *UNUSUAL SHORTNESS OF BREATH *UNUSUAL BRUISING OR BLEEDING *URINARY PROBLEMS (pain or burning when urinating, or frequent urination) *BOWEL PROBLEMS (unusual diarrhea, constipation, pain near the anus) TENDERNESS IN MOUTH AND THROAT WITH OR WITHOUT PRESENCE OF ULCERS (sore throat, sores in mouth, or a toothache) UNUSUAL RASH, SWELLING OR PAIN  UNUSUAL VAGINAL DISCHARGE OR ITCHING   Items with * indicate a potential emergency and should be followed up as soon as possible or go to the Emergency Department if any problems should occur.  Please show the CHEMOTHERAPY ALERT CARD or  IMMUNOTHERAPY ALERT CARD at check-in to the Emergency Department and triage nurse.  Should you have questions after your visit or need to cancel or reschedule your appointment, please contact [facility]  Dept: [phone]  and follow the prompts.  Office hours are 8:00 a.m. to 4:30 p.m. Monday - Friday. Please note that voicemails left after 4:00 p.m. may not be returned until the following business day.  We are closed weekends and major holidays. You have access to a nurse at all times for urgent questions. Please call the main number to the clinic Dept: [phone] and follow the prompts.   For any non-urgent questions, you may also contact your provider using MyChart. We now offer e-Visits for anyone 64 and older to request care online for non-urgent symptoms. For details visit [email or web address].   Also download the MyChart app! Go to the app store, search "MyChart", open the app, select [facility], and log in with your MyChart username and password.  Due to Covid, a mask is required upon entering the hospital/clinic. If you do not have a mask, one will be given to you upon arrival. For doctor visits, patients may have 1 support person aged 64 or older with them. For treatment visits, patients cannot have anyone with them due to current Covid guidelines and our immunocompromised population.

## 2021-11-29 NOTE — Progress Notes (Signed)
Nutrition Follow-up:  Patient with grade 3 follicular lymphoma. He is receiving R-CHOP.   Met with patient during infusion. He reports appetite is good. Patient's sister prepares him 3 good meals/day. He is drinking 2 Boost as well as small snacks of fruit in between meals. Patient reports his taste has improved. He continues baking soda salt water rinses several times daily. Patient denies nausea, vomiting, diarrhea, constipation.   Medications: reviewed   Labs: glucose 116  Anthropometrics: Weight 313 lb 4.8 oz stable x3 weeks  1/20 - 314 lb 8 oz 1/04 - 315 lb 12/29 - 317 lb 8 oz 12/21 - 314 lb   NUTRITION DIAGNOSIS: Unintentional weight loss stable   INTERVENTION:  Continue drinking Boost Plus/equivalent 2x/day - coupons provided Encouraged snacks in between meals with adequate calories and protein to promote weight maintenance Continue baking soda, salt water rinses several times daily   MONITORING, EVALUATION, GOAL: weight trends, intake   NEXT VISIT: To be scheduled as needed. Patient has contact information and encouraged to contact with nutrition questions/concerns

## 2021-11-29 NOTE — Progress Notes (Signed)
Okay to treat with pulse 106 perDr. Irene Limbo

## 2021-11-29 NOTE — Progress Notes (Cosign Needed Addendum)
HEMATOLOGY/ONCOLOGY CLINIC NOTE  Date of Service: .11/29/2021  Patient Care Team: Celene Squibb, MD as PCP - General (Internal Medicine)  CHIEF COMPLAINTS/PURPOSE OF CONSULTATION:   Follow-up for cycle 6 of R-CHOP chemotherapy for high-grade follicular lymphoma  HISTORY OF PRESENTING ILLNESS:  Please see previous notes for details on initial presentation  INTERVAL HISTORY  Joe [name] is here for follow-up of his high-grade follicular lymphoma for follow-up prior to his 6th cycle of R-CHOP chemotherapy.  He notes no acute new symptoms.  No fevers no chills no night sweats no unexpected weight loss.  No new lumps or bumps. No rashes. No new episodes of presyncope or syncope. Other reported new toxicities from his R-CHOP chemotherapy. No symptoms of overt neuropathy.  Labs done today reviewed with him in detail.   MEDICAL HISTORY:  Past Medical History:  Diagnosis Date   Anxiety    Arthritis    COVID    x 2 (2020 and 2022)   Depression    Follicular lymphoma ([place])    Hypertension    Left leg DVT ([place]) 07/2021   Pulmonary embolism ([place]) 07/2021    SURGICAL HISTORY: Past Surgical History:  Procedure Laterality Date   carpel tunnel     IR IMAGING GUIDED PORT INSERTION  10/10/2021   MASS BIOPSY Left 07/14/2021   Procedure: NECK MASS BIOPSY;  Surgeon: Melida Quitter, MD;  Location: [facility] OR;  Service: ENT;  Laterality: Left;    SOCIAL HISTORY: Social History   Socioeconomic History   Marital status: Widowed    Spouse name: Not on file   Number of children: Not on file   Years of education: Not on file   Highest education level: Not on file  Occupational History   Not on file  Tobacco Use   Smoking status: Former    Packs/day: 0.25    Types: Cigarettes    Quit date: 11/01/1986    Years since quitting: 35.1   Smokeless tobacco: Former    Types: Chew    Quit date: 11/28/1987  Vaping Use   Vaping Use: Never used  Substance and Sexual Activity   Alcohol  use: Yes    Comment: rare   Drug use: No   Sexual activity: Not on file  Other Topics Concern   Not on file  Social History Narrative   Not on file   Social Determinants of Health   Financial Resource Strain: Not on file  Food Insecurity: Not on file  Transportation Needs: Not on file  Physical Activity: Not on file  Stress: Not on file  Social Connections: Not on file  Intimate Partner Violence: Not on file    FAMILY HISTORY: Family History  Problem Relation Age of Onset   Healthy Mother    Healthy Father     ALLERGIES:  has No Known Allergies.  MEDICATIONS:  Current Outpatient Medications  Medication Sig Dispense Refill   acetaminophen (TYLENOL) 325 MG tablet Take 2 tablets (650 mg total) by mouth every 6 (six) hours as needed for mild pain (or Fever >/= 101). 20 tablet 0   albuterol (VENTOLIN HFA) 108 (90 Base) MCG/ACT inhaler Inhale 1-2 puffs into the lungs every 6 (six) hours as needed for wheezing or shortness of breath. 8 g 0   allopurinol (ZYLOPRIM) 300 MG tablet Take 300 mg by mouth daily.     ALPRAZolam (XANAX) 1 MG tablet Take 1 mg by mouth 3 (three) times daily as needed.  amLODipine (NORVASC) 10 MG tablet Take 10 mg by mouth daily.     amoxicillin-clavulanate (AUGMENTIN) 875-125 MG tablet Take 1 tablet by mouth 2 times daily. 10 tablet 0   apixaban (ELIQUIS) 5 MG TABS tablet Take 1 tablet (5 mg total) by mouth 2 (two) times daily. This replaces the Lovenox injections 60 tablet 3   feeding supplement (ENSURE ENLIVE / ENSURE PLUS) LIQD Take 237 mLs by mouth 2 (two) times daily between meals. 237 mL 12   lidocaine-prilocaine (EMLA) cream Apply 1 application topically as needed. 30-60 mins prior to port a cath access. Start using 2 weeks after port a cath placement. 30 g 0   ondansetron (ZOFRAN) 8 MG tablet Take 1 tablet (8 mg total) by mouth 2 (two) times daily as needed for refractory nausea / vomiting. Start on day 3 after cyclophosphamide chemotherapy. 30  tablet 1   predniSONE (DELTASONE) 20 MG tablet Take 3 tablets (60 mg total) by mouth daily. Take with food on days 2-6 of chemotherapy. (Patient taking differently: Take 60 mg by mouth See admin instructions. Take with food on days 2-6 of chemotherapy.) 25 tablet 5   prochlorperazine (COMPAZINE) 10 MG tablet Take 1 tablet (10 mg total) by mouth every 6 (six) hours as needed (Nausea or vomiting). 30 tablet 6   sertraline (ZOLOFT) 50 MG tablet Take 50 mg by mouth daily.     sodium chloride flush (NS) 0.9 % SOLN 10-40 mLs by Intracatheter route as needed (flush). 1000 mL 0   sodium chloride flush 0.9 % SOLN injection Inject 10-61ms as needed for intracatheter flush. 1000 mL 0   Sodium Chloride-Sodium Bicarb (SODIUM BICARBONATE/SODIUM CHLORIDE) SOLN 1 application by Mouth Rinse route as needed for dry mouth. 1000 mL 0   Vitamin D, Ergocalciferol, (DRISDOL) 1.25 MG (50000 UNIT) CAPS capsule TAKE ONE CAPSULE BY MOUTH ONCE a WEEK 12 capsule 0   No current facility-administered medications for this visit.    REVIEW OF SYSTEMS:   10 Point review of Systems was done is negative except as noted above.  PHYSICAL EXAMINATION: ECOG PERFORMANCE STATUS: 1 - Symptomatic but completely ambulatory .BP (!) 144/85    Pulse (!) 107    Temp (!) 97.5 F (36.4 C)    Resp 18    Wt (!) 313 lb 4.8 oz (142.1 kg)    SpO2 98%    BMI 46.27 kg/m   GENERAL:alert, in no acute distress and comfortable SKIN: no acute rashes, no significant lesions EYES: conjunctiva are pink and non-injected, sclera anicteric OROPHARYNX: MMM, no exudates, no oropharyngeal erythema or ulceration NECK: supple, no JVD LYMPH:  no palpable lymphadenopathy in the cervical, axillary or inguinal regions LUNGS: clear to auscultation b/l with normal respiratory effort HEART: regular rate & rhythm ABDOMEN:  normoactive bowel sounds , non tender, not distended. Extremity: no pedal edema PSYCH: alert & oriented x 3 with fluent speech NEURO: no focal  motor/sensory deficits  LABORATORY DATA:  I have reviewed the data as listed . CBC Latest Ref Rng & Units 11/09/2021 10/18/2021 10/13/2021  WBC 4.0 - 10.5 K/uL 7.3 9.4 7.7  Hemoglobin 13.0 - 17.0 g/dL 12.0(L) 11.5(L) 11.7(L)  Hematocrit 39.0 - 52.0 % 36.2(L) 35.3(L) 35.0(L)  Platelets 150 - 400 K/uL 381 456(H) 280    . CMP Latest Ref Rng & Units 11/09/2021 10/18/2021 10/13/2021  Glucose 70 - 99 mg/dL 116(H) 87 103(H)  BUN 8 - 23 mg/dL '11 20 9  ' Creatinine 0.61 - 1.24 mg/dL 1.08 1.01 0.94  Sodium 135 - 145 mmol/L 140 141 137  Potassium 3.5 - 5.1 mmol/L 4.0 3.4(L) 3.9  Chloride 98 - 111 mmol/L 103 104 101  CO2 22 - 32 mmol/L '30 31 25  ' Calcium 8.9 - 10.3 mg/dL 9.5 9.0 8.9  Total Protein 6.5 - 8.1 g/dL 6.8 6.8 7.0  Total Bilirubin 0.3 - 1.2 mg/dL 0.3 0.3 0.4  Alkaline Phos 38 - 126 U/L 58 54 68  AST 15 - 41 U/L '17 17 20  ' ALT 0 - 44 U/L '23 28 26     ' SURGICAL PATHOLOGY  CASE: [id number]  PATIENT: Joe [name]  Surgical Pathology Report   Clinical History: neck mass (cm)   FINAL MICROSCOPIC DIAGNOSIS:   A. SOFT TISSUE MASS, LEFT NECK, BIOPSY:  - Non-Hodgkin B-cell lymphoma, see comment.   COMMENT:   The majority of the fragments have extensive crush artifact hampering  evaluation. One fragment is well preserved and demonstrates an atypical  lymphoid proliferation with predominately small irregular lymphocytes.  Immunohistochemistry is positive for CD20, bcl-2, bcl-6 and CD10. CD23  is largely negative. CyclinD1 and light chain in situ hybridization are  negative. Ki-67 is 20-25%. CD3 and CD5 highlight scattered T-cells. Flow  cytometry ([id number]) reveals predominately T-cells. Overall, the  findings are consistent with a non-Hodgkin B-cell lymphoma, specifically  follicular lymphoma. There is only limited preserved tissue hampering  grading, but this area appears low grade (grade 1-2 of 3). A higher  grade process in the crushed fragments cannot be excluded. Clinical   correlation is recommended. Dr. Redmond Baseman was attempted on 07/18/2021.   RADIOGRAPHIC STUDIES: I have personally reviewed the radiological images as listed and agreed with the findings in the report.  No results found.  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 Based on limited sampling the patient's pathology reveals a grade 1 through 2 follicular lymphoma with a Ki-67 of 25%.  There was a lot of crush artifact in the sample and therefore a high-grade process could not be ruled out. However the predominant element of his mass has grown significantly over the last 1 month which is concerning for a high-grade follicular lymphoma or follicular lymphoma with transformation to large B-cell lymphoma.  CT neck imaging shows concern with compression of left internal jugular vein with mild mass-effect on the airway in the neck and the left carotid is inseparable from the mass.  High risk of syncopal episodes if there is compression of the common carotid baroreceptors and left vagus nerve  PET/CT 08/10/2021 showed  1. Examination is positive for large FDG avid mass within the left neck extending from the level 2 nodal chain into the left side of the superior mediastinum compatible with metabolically active tumor. 2. There are 2 foci of increased uptake within the neck and body of pancreas lymphomatous involvement of the pancreas. 3. Subcentimeter FDG avid right inguinal lymph node also concerning for lymphoma. 4. Several foci of increased uptake within the axial skeleton compatible with malignancy  2) HTN  3) Obstructive sleep apnea not on CPAP machine.  This is causing sleep issues and possible cognitive issues.  4) Depression and anxiety  5) Acute pulmonary embolism due to lymphoma plus body habitus plus possible vascular compression - on lovenox  6) PICC line in  situ PLAN -Patient's labs from today reviewed hemoglobin stable at 11 normal WBC count of 4.9k, normal platelets of 362  CMP within normal limits LDH within normal limits at 151 No Symptoms suggestive of lymphoma progression at this time No notable toxicities from cycle 5 of R-CHOP chemotherapy. Patient is appropriate to proceed with cycle 6 of R-CHOP chemotherapy as scheduled today. -Orders reviewed and signed.  We will continue same supportive medications. -Schedule a PET CT scan in 5 weeks for post treatment response assessment. We have we will see him back in 6 weeks to review his repeat labs and PET/CT results and plan further care.  FollowPET/CT in 5 weeks Return to clinic with Dr. Irene Limbo with port flush and labs in 6 weeks-up  The total time spent in the appointment was 31 minutes*.  All of the patient's questions were answered with apparent satisfaction. The patient knows to call the clinic with any problems, questions or concerns.   Sullivan Lone MD MS AAHIVMS [facility] Hematology/Oncology Physician [facility]  .*Total Encounter Time as defined by the Centers for Medicare and Medicaid Services includes, in addition to the face-to-face time of a patient visit (documented in the note above) non-face-to-face time: obtaining and reviewing outside history, ordering and reviewing medications, tests or procedures, care coordination (communications with other health care professionals or caregivers) and documentation in the medical record.

## 2021-11-30 ENCOUNTER — Telehealth: Payer: Self-pay | Admitting: Hematology

## 2021-11-30 NOTE — Telephone Encounter (Signed)
Scheduled follow-up appointment per 2/9 los. Patient is aware. °

## 2021-12-01 ENCOUNTER — Inpatient Hospital Stay: Payer: BC Managed Care – PPO

## 2021-12-01 ENCOUNTER — Other Ambulatory Visit: Payer: Self-pay

## 2021-12-01 VITALS — BP 152/85 | HR 101 | Temp 97.8°F | Resp 20

## 2021-12-01 DIAGNOSIS — C8211 Follicular lymphoma grade II, lymph nodes of head, face, and neck: Secondary | ICD-10-CM | POA: Diagnosis not present

## 2021-12-01 DIAGNOSIS — C8221 Follicular lymphoma grade III, unspecified, lymph nodes of head, face, and neck: Secondary | ICD-10-CM

## 2021-12-01 DIAGNOSIS — Z7189 Other specified counseling: Secondary | ICD-10-CM

## 2021-12-01 DIAGNOSIS — Z79899 Other long term (current) drug therapy: Secondary | ICD-10-CM | POA: Diagnosis not present

## 2021-12-01 DIAGNOSIS — Z5111 Encounter for antineoplastic chemotherapy: Secondary | ICD-10-CM | POA: Diagnosis not present

## 2021-12-01 MED ORDER — PEGFILGRASTIM-CBQV 6 MG/0.6ML ~~LOC~~ SOSY
6.0000 mg | PREFILLED_SYRINGE | Freq: Once | SUBCUTANEOUS | Status: AC
  Administered 2021-12-01: 6 mg via SUBCUTANEOUS
  Filled 2021-12-01: qty 0.6

## 2021-12-03 ENCOUNTER — Telehealth: Payer: Self-pay | Admitting: *Deleted

## 2021-12-03 NOTE — Telephone Encounter (Signed)
Connected with [place] to request new request for medical records addressed to legal name "[place]" for 11/14/2021 request for medical records from 10/18/2022 to present forms nurses received 11/16/2021.  Representative reports all records we're received 11/15/2021.  Denies need for records at this time.    NICHOLIS STEPANEK [phone]) notified of Short[employer] claim [id number] status.  If further records needed an authorization release for use and disclosure will need to be signed for office to comply per H.I[place]P.[place] guidelines.   Expressed his thanks for update.  Currently no questions or needs.

## 2021-12-06 ENCOUNTER — Encounter: Payer: Self-pay | Admitting: Hematology

## 2021-12-14 NOTE — Addendum Note (Signed)
Addended by: Sullivan Lone on: 12/14/2021 09:18 AM   Modules accepted: Orders

## 2021-12-17 ENCOUNTER — Other Ambulatory Visit: Payer: Self-pay | Admitting: Orthopaedic Surgery

## 2021-12-28 ENCOUNTER — Telehealth: Payer: Self-pay

## 2021-12-28 NOTE — Telephone Encounter (Signed)
Notified Patient of completion of requested Disability Form. Fax transmission confirmation received. Request for medical records forwarded to [place] Management Department with signed Release of Information Form. No other needs or concerns voiced at this time. ?

## 2022-01-03 ENCOUNTER — Other Ambulatory Visit: Payer: Self-pay

## 2022-01-03 ENCOUNTER — Ambulatory Visit (HOSPITAL_COMMUNITY)
Admission: RE | Admit: 2022-01-03 | Discharge: 2022-01-03 | Disposition: A | Discharge status: Home / Self Care | Payer: BC Managed Care – PPO | Source: Ambulatory Visit | Attending: Hematology | Admitting: Hematology

## 2022-01-03 DIAGNOSIS — Z5111 Encounter for antineoplastic chemotherapy: Secondary | ICD-10-CM | POA: Insufficient documentation

## 2022-01-03 DIAGNOSIS — C8221 Follicular lymphoma grade III, unspecified, lymph nodes of head, face, and neck: Secondary | ICD-10-CM | POA: Diagnosis not present

## 2022-01-03 LAB — GLUCOSE, CAPILLARY: Glucose-Capillary: 95 mg/dL (ref 70–99)

## 2022-01-03 MED ORDER — FLUDEOXYGLUCOSE F - 18 (FDG) INJECTION
15.7000 | Freq: Once | INTRAVENOUS | Status: AC
  Administered 2022-01-03: 15.58 via INTRAVENOUS

## 2022-01-07 ENCOUNTER — Ambulatory Visit: Payer: 59 | Admitting: Pulmonary Disease

## 2022-01-07 ENCOUNTER — Ambulatory Visit: Payer: 59 | Attending: Pulmonary Disease | Admitting: Pulmonary Disease

## 2022-01-07 ENCOUNTER — Other Ambulatory Visit: Payer: Self-pay

## 2022-01-07 DIAGNOSIS — G4730 Sleep apnea, unspecified: Secondary | ICD-10-CM

## 2022-01-07 DIAGNOSIS — G4733 Obstructive sleep apnea (adult) (pediatric): Secondary | ICD-10-CM | POA: Insufficient documentation

## 2022-01-09 ENCOUNTER — Other Ambulatory Visit: Payer: Self-pay

## 2022-01-09 ENCOUNTER — Telehealth: Payer: Self-pay | Admitting: Pulmonary Disease

## 2022-01-09 ENCOUNTER — Inpatient Hospital Stay: Payer: 59

## 2022-01-09 ENCOUNTER — Inpatient Hospital Stay: Payer: 59 | Attending: Hematology | Admitting: Hematology

## 2022-01-09 VITALS — BP 143/78 | HR 91 | Temp 97.3°F | Resp 18 | Wt 312.0 lb

## 2022-01-09 DIAGNOSIS — C8221 Follicular lymphoma grade III, unspecified, lymph nodes of head, face, and neck: Secondary | ICD-10-CM | POA: Diagnosis not present

## 2022-01-09 DIAGNOSIS — C8211 Follicular lymphoma grade II, lymph nodes of head, face, and neck: Secondary | ICD-10-CM | POA: Insufficient documentation

## 2022-01-09 DIAGNOSIS — G4733 Obstructive sleep apnea (adult) (pediatric): Secondary | ICD-10-CM

## 2022-01-09 DIAGNOSIS — Z5111 Encounter for antineoplastic chemotherapy: Secondary | ICD-10-CM

## 2022-01-09 DIAGNOSIS — Z452 Encounter for adjustment and management of vascular access device: Secondary | ICD-10-CM

## 2022-01-09 LAB — CBC WITH DIFFERENTIAL (CANCER CENTER ONLY)
Abs Immature Granulocytes: 0.02 10*3/uL (ref 0.00–0.07)
Basophils Absolute: 0.1 10*3/uL (ref 0.0–0.1)
Basophils Relative: 2 %
Eosinophils Absolute: 0.6 10*3/uL — ABNORMAL HIGH (ref 0.0–0.5)
Eosinophils Relative: 10 %
HCT: 36.6 % — ABNORMAL LOW (ref 39.0–52.0)
Hemoglobin: 12.1 g/dL — ABNORMAL LOW (ref 13.0–17.0)
Immature Granulocytes: 0 %
Lymphocytes Relative: 18 %
Lymphs Abs: 1.1 10*3/uL (ref 0.7–4.0)
MCH: 31 pg (ref 26.0–34.0)
MCHC: 33.1 g/dL (ref 30.0–36.0)
MCV: 93.8 fL (ref 80.0–100.0)
Monocytes Absolute: 0.7 10*3/uL (ref 0.1–1.0)
Monocytes Relative: 12 %
Neutro Abs: 3.6 10*3/uL (ref 1.7–7.7)
Neutrophils Relative %: 58 %
Platelet Count: 225 10*3/uL (ref 150–400)
RBC: 3.9 MIL/uL — ABNORMAL LOW (ref 4.22–5.81)
RDW: 15.7 % — ABNORMAL HIGH (ref 11.5–15.5)
WBC Count: 6.1 10*3/uL (ref 4.0–10.5)
nRBC: 0 % (ref 0.0–0.2)

## 2022-01-09 LAB — CMP (CANCER CENTER ONLY)
ALT: 20 U/L (ref 0–44)
AST: 32 U/L (ref 15–41)
Albumin: 3.9 g/dL (ref 3.5–5.0)
Alkaline Phosphatase: 56 U/L (ref 38–126)
Anion gap: 9 (ref 5–15)
BUN: 16 mg/dL (ref 8–23)
CO2: 29 mmol/L (ref 22–32)
Calcium: 9.2 mg/dL (ref 8.9–10.3)
Chloride: 100 mmol/L (ref 98–111)
Creatinine: 0.92 mg/dL (ref 0.61–1.24)
GFR, Estimated: >60 mL/min (ref >60)
Glucose, Bld: 105 mg/dL — ABNORMAL HIGH (ref 70–99)
Potassium: 4.2 mmol/L (ref 3.5–5.1)
Sodium: 138 mmol/L (ref 135–145)
Total Bilirubin: 0.7 mg/dL (ref 0.3–1.2)
Total Protein: 7.2 g/dL (ref 6.5–8.1)

## 2022-01-09 LAB — LACTATE DEHYDROGENASE: LDH: 178 U/L (ref 98–192)

## 2022-01-09 MED ORDER — HEPARIN SOD (PORK) LOCK FLUSH 100 UNIT/ML IV SOLN
500.0000 [IU] | Freq: Once | INTRAVENOUS | Status: AC
  Administered 2022-01-09: 500 [IU]

## 2022-01-09 MED ORDER — SODIUM CHLORIDE 0.9% FLUSH
10.0000 mL | Freq: Once | INTRAVENOUS | Status: AC
  Administered 2022-01-09: 10 mL

## 2022-01-09 NOTE — Telephone Encounter (Signed)
ATC patient. LMTCB with RDS office number  

## 2022-01-09 NOTE — Procedures (Signed)
? ? ? ?  Patient Name: Joe [name], Joe [name] ?Study Date: 01/07/2022 ?Gender: Male ?D.O.B: January 10, 1956 ?Age (years): 7 ?Referring Provider: Maryjane Hurter ?Height (inches): 69 ?Interpreting Physician: Chesley Mires MD, ABSM ?Weight (lbs): 317 ?RPSGT: Rosebud Poles ?BMI: 47 ?MRN: [id number] ?Neck Size: 19.50 ? ?CLINICAL INFORMATION ?The patient is referred for a CPAP titration to treat sleep apnea. ? ?Date of HST 09/07/21: AHI 68.5, SpO2 low 39%. ? ?SLEEP STUDY TECHNIQUE ?As per the AASM Manual for the Scoring of Sleep and Associated Events v2.3 (April 2016) with a hypopnea requiring 4% desaturations. ? ?The channels recorded and monitored were frontal, central and occipital EEG, electrooculogram (EOG), submentalis EMG (chin), nasal and oral airflow, thoracic and abdominal wall motion, anterior tibialis EMG, snore microphone, electrocardiogram, and pulse oximetry. Continuous positive airway pressure (CPAP) was initiated at the beginning of the study and titrated to treat sleep-disordered breathing. ? ?MEDICATIONS ?Medications self-administered by patient taken the night of the study : N/A ? ?TECHNICIAN COMMENTS ?Comments added by technician: CPAP therapy started at 4 CWP. CPAP initiated w/o O2 supplement per protocol. Titration increased to 10 CWP due to events and mild snoring episodes, with and EPR of 2. CPAP pressure decreased to 9 CWP, after bathroom trip. Patient had difficulties maintaing sleep after bathroom trip. Patient stated, he did usually gets 4-5 hrs of sleep only. So he requested to d/c CPAP therapy. Suboptimal pressure obtained due to REM-supine stage not observed. Patient stated, he does not sleep in supine position ?Comments added by scorer: N/A ? ?RESPIRATORY PARAMETERS ?Optimal PAP Pressure (cm): 8 AHI at Optimal Pressure (/hr): 6.9 ?Overall Minimal O2 (%): 83.00 Supine % at Optimal Pressure (%): 0 ?Minimal O2 at Optimal Pressure (%): 88.0  ? ?SLEEP ARCHITECTURE ?The study was initiated at 10:59:17  PM and ended at 5:13:55 AM. ? ?Sleep onset time was 34.8 minutes and the sleep efficiency was 59.0%. The total sleep time was 221 minutes. ? ?The patient spent 11.09% of the night in stage N1 sleep, 68.10% in stage N2 sleep, 20.81% in stage N3 and 0% in REM.Stage REM latency was N/A minutes ? ?Wake after sleep onset was 118.9. Alpha intrusion was absent. Supine sleep was 16.74%. ? ?CARDIAC DATA ?The 2 lead EKG demonstrated sinus rhythm. The mean heart rate was 77.37 beats per minute. Other EKG findings include: PVCs. ? ?LEG MOVEMENT DATA ?The total Periodic Limb Movements of Sleep (PLMS) were 0. The PLMS index was 0.00. A PLMS index of <15 is considered normal in adults. ? ?IMPRESSIONS ?- He did best with CPAP 8 cm H2O. ?- He did not require supplemental oxygen during this study. ? ?DIAGNOSIS ?- Obstructive Sleep Apnea (G47.33) ? ?RECOMMENDATIONS ?- Trial of CPAP therapy on 8 cm H2O with a Small-Medium size Fisher[employer]kel Full Face Evora Full mask and heated humidification. ?- Avoid alcohol, sedatives and other CNS depressants that may worsen sleep apnea and disrupt normal sleep architecture. ?- Sleep hygiene should be reviewed to assess factors that may improve sleep quality. ?- Weight management and regular exercise should be initiated or continued. ? ?[Electronically signed] 01/09/2022 09:16 AM ? ?Chesley Mires MD, ABSM ?Diplomate, Tax adviser of Sleep Medicine ?NPI: [id number] ? ?[place] ?PH: [phone]   FX: [phone] ?ACCREDITED BY THE AMERICAN ACADEMY OF SLEEP MEDICINE ? ?

## 2022-01-09 NOTE — Telephone Encounter (Signed)
CPAP 01/07/22 >> CPAP 8 cm H2O >> AHI 6.9, centrals with higher pressure. ? ? ?Please let him know sleep study reviewed and he did best with CPAP at 8 cm H2O.  Based on the study he did not need to use supplemental oxygen once on CPAP therapy.   ? ?Please send order to get him set up with Resmed CPAP at 8 cm H2O with heated humidity and mask of choice.  He needs ROV in 3 to 4 months. ?

## 2022-01-10 ENCOUNTER — Telehealth: Payer: Self-pay | Admitting: Radiation Oncology

## 2022-01-10 ENCOUNTER — Telehealth: Payer: Self-pay | Admitting: Hematology

## 2022-01-10 DIAGNOSIS — G4730 Sleep apnea, unspecified: Secondary | ICD-10-CM | POA: Diagnosis not present

## 2022-01-10 NOTE — Telephone Encounter (Signed)
ATC patient through preferred number on file. Niece answered and states that that number is an emergency contact number. Gave me appropriate number to call to get in touch with patient. [phone]. Will ATC patient on this number. Changed preferred number in chart.  ?

## 2022-01-10 NOTE — Telephone Encounter (Signed)
Called and went over results with patient. He states he just received a CPAP 2 months ago and does not need a new one. Will place an order for pressures to be readjusted.  ?Patient would like to keep April appt that was already scheduled instead of scheduling further out.  ?Order placed and noting further needed.  ?

## 2022-01-10 NOTE — Telephone Encounter (Signed)
Scheduled follow-up appointment per 3/22 los. Patient is aware. ?

## 2022-01-10 NOTE — Telephone Encounter (Signed)
3/23 @ 8:16 am Left voicemail for patient to call our office.   ?

## 2022-01-11 NOTE — Progress Notes (Signed)
Lymphoma Location(s) / Histology:  ?Grade 3 follicular lymphoma of lymph nodes of neck  ? ?Joe [name] presented with symptoms of: first presented to the emergency room on 07/09/2021 with newly noted left-sided neck mass which showed up over a week.  He initially went to urgent care and was treated with Augmentin for possible infectious etiology but came to the emergency room when his left neck mass continued to get larger and more uncomfortable. ? ?Biopsies revealed:  ?07/14/2021 ?FINAL MICROSCOPIC DIAGNOSIS:  ?A. SOFT TISSUE MASS, LEFT NECK, BIOPSY:  ?- Non-Hodgkin B-cell lymphoma, see comment.  ?COMMENT:  ?The majority of the fragments have extensive crush artifact hampering evaluation. One fragment is well preserved and demonstrates an atypical lymphoid proliferation with predominately small irregular lymphocytes. Immunohistochemistry is positive for CD20, bcl-2, bcl-6 and CD10. CD23 is largely negative. CyclinD1 and light chain in situ hybridization are negative. Ki-67 is 20-25%. CD3 and CD5 highlight scattered T-cells. Flow cytometry ([id number]) reveals predominately T-cells. Overall, the  ?findings are consistent with a non-Hodgkin B-cell lymphoma, specifically follicular lymphoma. There is only limited preserved tissue hampering grading, but this area appears low grade (grade 1-2 of 3). A higher grade process in the crushed fragments cannot be excluded ? ?Past/Anticipated interventions by medical oncology, if any:  ?Under care of Dr. Sullivan Lone ?11/09/2021 ?--PLAN ?Patient notes no further toxicities from his fourth cycle of R-CHOP chemotherapy. ?Labs done today reviewed with the patient as noted above. ?Patient is appropriate to proceed with cycle 5 of R-CHOP chemotherapy with same doses and same supportive medications. ?Chemotherapy orders were reviewed and signed. ?Continue current supportive medications for nausea and vomiting control. ?Management of sleep apnea per pulmonary. ?No other prohibitive  toxicities noted from current chemotherapy. ?--Follow-up ?Please follow-up for cycle 6 of R-CHOP chemotherapy as scheduled in 3 weeks ? ?Past/Anticipated interventions by otolaryngology, if any:  ?07/14/2021 ?--Dr. Melida Quitter  ?Open biopsy left neck mass ? ?Tobacco/Marijuana/Snuff/ETOH use: Patient has a history of cigarette and smokeless tobacco use (quit both in 1988 and 1989 respectively). Denies any current alcohol consumption or recreational drug use ? ?Weight changes, if any, over the past 6 months: Reports ~58 lb loss since diagnosis and starting his cancer treatment ? ?Recurrent fevers, or drenching night sweats, if any: Patient denies ? ?SAFETY ISSUES: ?Prior radiation? No ?Pacemaker/ICD? No ?Possible current pregnancy? N/A ?Is the patient on methotrexate? No ? ?Current Complaints / other details:  On 53m Eliquis BID for acute PE ? ? ? ?

## 2022-01-13 NOTE — Progress Notes (Signed)
?Radiation Oncology         [phone] ?________________________________ ? ?Initial Outpatient Consultation ? ?Name: YSMAEL [name] MRN: [id number]  ?Date: 01/14/2022  DOB: 14-Aug-1956 ? ?GH:WEXH, Edwinna Areola, MD  Brunetta Genera, MD  ? ?REFERRING PHYSICIAN: Brunetta Genera, MD ? ?DIAGNOSIS:  ?  ICD-10-CM   ?1. 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  ?2. 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  ? 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 ?CHIEF COMPLAINT: Here to discuss management of follicular lymphoma of the neck ? ?HISTORY OF PRESENT ILLNESS::Joe [name] is a 66 y.o. male who presented to the ED on 07/09/21 with the cc of a left sided neck mass x 1 week. The patient also presented to an urgent care one week prior for this and was given augmentin. Despite this, the mass continued to increase in size. In the ED, the patient reported some left neck tenderness, and difficulty swallowing due to the mass pressing on the trachea. He otherwise denied any other symptoms. Soft tissue neck CT performed for evaluation in the ED showed a large heterogeneous soft tissue mass in the left neck measuring 7.9 x 6.1 x 8.2 cm, extending from the skull base/parapharyngeal space inferiorly to the supraclavicular fossa, located immediately subjacent to and inseparable from the left sternocleidomastoid. The left internal jugular vein was noted to be obliterated by the mass, and left carotid was appreciated as inseparable from the mass, poorly visualized. Overall, findings were noted to possibly  represent malignancy or infectious phlegmon. CT also showed nonspecific mild surrounding stranding and  scattered prominent lymph nodes throughout the neck. He was discharged with a stronger antibiotic and instructions for OP ENT evaluation. ? ?Subsequently, the patient was referred to Dr. Redmond Baseman, ENT, on 07/11/21 for further evaluation. During this visit, the patient also endorsed restricted breathing when laying down, and eating slower due to throat pressure. Accordingly, Dr. Redmond Baseman collected FNA of the mass during this visit which revealed atypical lymphoid proliferation.   ? ?An additional biopsy of the left neck mass under anesthesia on 07/14/21 revealed Non-Hodgkin B-cell lymphoma, specifically follicular lymphoma. Diagnostic comment also indicated the presence of predominately T-cells without an aberrant phenotype.  This is considered grade 3 by medical oncology ? ?Of note: the patient presented to the ED on 07/28/21 for evaluation of a possible seizure. (He was also at a hospital in [place] the week prior to this and evaluated for syncope and was found to have elevated carbon dioxide level).  Per ED notes, the patient was at home when he had a sudden shaking episode that lasted about 15 seconds.  He then awoke a short time later.  The patient's family also recorded an episode on camera of him having a nonspecific appearing bilateral upper extremity movement and turned his head to the right.  The patient did not recall this episode and did not feel like he injured himself during this activity. CT of the head was negative and labs were clear. He was discharged home in stable condition with strict OP follow-up instructions.  ? ?The patient was again admitted on 07/31/21 through 08/02/21 for evaluation  of hypertension. Work-up in the ED showed elevated creatinine at 1.41 (baseline creatinine at 1.1-1.3), D-dimer 3.59. CTA of the chest performed showed PE and the patient was accordingly admitted and started on heparin drip.  ? ?The patient was then referred to Dr. Irene Limbo on 07/30/21 to discuss treatment options.  During this visit, the patient did report some increased fatigue but denied any fever, chills, night sweats, or sudden unexpected weight loss. Options discussed with the patient included consideration of repeat biopsy however the patient was quite symptomatic at the time from compression of his internal jugular carotid artery (and some compression on his airway) and was eager to get start treatment asap. Subsequently, the patient was started on empiric prednisone pending additional work-up and starting treatment. Dr. Irene Limbo also ordered PET imaging for staging.  ? ?During follow up with Dr. Irene Limbo on 08/09/21, the patient reported some decrease in size of the lymphoma with the use of steroids and some decrease in his dysphagia.  He also reported eating better as a result of this. Treatment options were further discussed with the patient and he opted to proceed with chemotherapy consisting of R-CHOP. The patient received cycle 1 od R-CHOP while he was hospitalized on 08/15/21 through 08/25/21 for another episode of syncope (and PE).   ? ?PET/CT 08/10/2021 showed  the large FDG avid mass within the left neck extending from the level 2 nodal chain into the left side of the superior mediastinum compatible with metabolically active tumor. 2 foci of increased uptake within the neck and body of pancreas were also appreciated, with lymphomatous involvement of the pancreas, as well as a FDG avid right inguinal lymph node concerning for lymphoma. Several foci of increased uptake within the axial skeleton were also appreciated and noted as compatible with malignancy ? ?Prior to starting cycle 2 of treatment, the patient reported significant shrinkage of the left neck mass on 09/07/21. He also reported no further presyncopal-syncopal episodes or confusion. The mass continued to shrink, and the patient toelrated systemic treatment well per Dr. Irene Limbo.  ? ?However, the patient presented to the ED on 10/13/21 for evaluation of 6-8  syncopic episodes occurring that day. Patient also reported associated productive cough (of green mucus) which would trigger these episodes, and increased SOB. CTA performed showed new scattered groundglass pulmonary nodules, noted as likely infectious/inflammatory in etiology, and no evidence of PE.  He was given Tessalon and prednisone to help with his cough with improvement of symptoms.  He denied any further episodes of syncope in the emergency department. Per ED notes, his syncopal episodes on this date were likely exacerbated by viral cough causing additional pressure.  ? ?Pertinent imaging thus far includes: ?-- PET on 10/25/21 showed near complete resolution of the large left neck mass, without any significant measurable disease appreciated. Imaging also showed diffuse marrow uptake, likely due to rebound from chemotherapy or marrow stimulating drugs.  ?-- PET on 01/03/22 showed persistent hypermetabolic soft tissue activity in the left level 3 neck, with metabolic activity slightly above liver activity ([place] 4). The soft tissue component showed no significant change from most recent PET-CT scan, though metabolic activity was seen to have slightly increased. ? ?I have personally reviewed his pre and posttreatment imaging.  The patient lives in [place] but wants to get treatment here in [place] [employer].  He lives with his sister.  He denies prior radiation. ? ?The patient recently met with Dr. Irene Limbo on 01/09/22, though encounter details are not available currently.  ? ?(  Most recent R-CHOP cycle on 11/29/21; cycle 6; no toxicities reported so far).  ? ?Swallowing issues, if any: reports that the neck mass presses on his trachea, and eating slower due to throat pressure / dysphagia ? ?Weight Changes: He has lost about 58 pounds since diagnosis and starting his cancer treatment.  He had some taste changes during chemotherapy but they have largely resolved ? ?Pain status: none ? ?Other symptoms:  increase in fatigue, restricted breathing when laying down (history of significant sleep apnea) ? ?Tobacco history, if any: former smoker, quit 35 years ago; 1/4 pack per day ? ?ETOH abuse, if any: reports current alcohol [place]

## 2022-01-14 ENCOUNTER — Ambulatory Visit
Admission: RE | Admit: 2022-01-14 | Discharge: 2022-01-14 | Disposition: A | Discharge status: Home / Self Care | Payer: 59 | Source: Ambulatory Visit | Attending: Radiation Oncology | Admitting: Radiation Oncology

## 2022-01-14 ENCOUNTER — Encounter: Payer: Self-pay | Admitting: Radiation Oncology

## 2022-01-14 ENCOUNTER — Encounter: Payer: Self-pay | Admitting: Hematology

## 2022-01-14 ENCOUNTER — Telehealth: Payer: Self-pay | Admitting: *Deleted

## 2022-01-14 ENCOUNTER — Other Ambulatory Visit: Payer: Self-pay

## 2022-01-14 VITALS — BP 131/80 | HR 88 | Resp 17 | Wt 313.0 lb

## 2022-01-14 DIAGNOSIS — Z79899 Other long term (current) drug therapy: Secondary | ICD-10-CM | POA: Insufficient documentation

## 2022-01-14 DIAGNOSIS — Z7901 Long term (current) use of anticoagulants: Secondary | ICD-10-CM | POA: Insufficient documentation

## 2022-01-14 DIAGNOSIS — I10 Essential (primary) hypertension: Secondary | ICD-10-CM | POA: Insufficient documentation

## 2022-01-14 DIAGNOSIS — Z86711 Personal history of pulmonary embolism: Secondary | ICD-10-CM | POA: Insufficient documentation

## 2022-01-14 DIAGNOSIS — Z86718 Personal history of other venous thrombosis and embolism: Secondary | ICD-10-CM | POA: Insufficient documentation

## 2022-01-14 DIAGNOSIS — C8221 Follicular lymphoma grade III, unspecified, lymph nodes of head, face, and neck: Secondary | ICD-10-CM | POA: Insufficient documentation

## 2022-01-14 DIAGNOSIS — F419 Anxiety disorder, unspecified: Secondary | ICD-10-CM | POA: Insufficient documentation

## 2022-01-14 DIAGNOSIS — Z87891 Personal history of nicotine dependence: Secondary | ICD-10-CM | POA: Insufficient documentation

## 2022-01-14 DIAGNOSIS — C8251 Diffuse follicle center lymphoma, lymph nodes of head, face, and neck: Secondary | ICD-10-CM

## 2022-01-14 DIAGNOSIS — K439 Ventral hernia without obstruction or gangrene: Secondary | ICD-10-CM | POA: Insufficient documentation

## 2022-01-14 DIAGNOSIS — R5383 Other fatigue: Secondary | ICD-10-CM

## 2022-01-14 DIAGNOSIS — Z8616 Personal history of COVID-19: Secondary | ICD-10-CM | POA: Insufficient documentation

## 2022-01-14 NOTE — Progress Notes (Addendum)
Oncology Nurse Navigator Documentation  ? ?Met with patient during initial consult with Dr. Isidore Moos.  ?I introduced myself as his/their Navigator, explained my role as a member of the Care Team and provided him with my direct contact information.  ?Assisted with post-consult appt scheduling.  ?He verbalized understanding of information provided. ? ?He will have his CT simulation today as well to plan his radiation treatment.  ? ?I encouraged him to call me prior to starting radiation with any questions or concerns.  ? ?Harlow Asa, RN, BSN, OCN ?Head & Neck Oncology Nurse Navigator ?[facility] ?[phone]  ?

## 2022-01-14 NOTE — Telephone Encounter (Signed)
CALLED PATIENT TO ASK ABOUT HAVING A LAB ON 01-23-22, SPOKE WITH PATIENT AND HE AGREED TO DO SO @ 3 PM ?

## 2022-01-15 NOTE — Progress Notes (Signed)
? ? ?HEMATOLOGY/ONCOLOGY CLINIC NOTE ? ?Date of Service: 01/09/2022 ? ? ?Patient Care Team: ?Celene Squibb, MD as PCP - General (Internal Medicine) ? ?CHIEF COMPLAINTS/PURPOSE OF CONSULTATION:  ? ?Follow-up for high-grade follicular lymphoma status post 6 cycles of R-CHOP chemotherapy ? ?HISTORY OF PRESENTING ILLNESS:  ?Please see previous notes for details on initial presentation ? ?INTERVAL HISTORY ? ?[name] is here for follow-up after having completed his planned 6 cycles of R-CHOP chemotherapy. ?He notes no acute new symptoms and feels like he is recovering well from his chemo immunotherapy. ?He is eating better and notes no significant residual fatigue. ?No shortness of breath or chest pain.  No new lumps or bumps.  No fevers no chills no night sweats.  No new issues with infections. ?No significant symptoms of neuropathy. ? ?We discussed his PET CT scan from 01/03/2022 which showed some persistent hypermetabolic soft tissue in the left neck.  No change in size of this area.  No evidence of lymphoma elsewhere on the PET CT scan. ? ?Labs done today were discussed in details. ? ?We discussed referral to radiation oncology for consideration of I-S RT to his bulky disease in the left neck with some mild residual FDG avidity.  Patient is agreeable and referral was placed. ? ?MEDICAL HISTORY:  ?Past Medical History:  ?Diagnosis Date  ? Anxiety   ? Arthritis   ? COVID   ? x 2 (2020 and 2022)  ? Depression   ? Follicular lymphoma ([place])   ? Hypertension   ? Left leg DVT ([place]) 07/2021  ? Pulmonary embolism ([place]) 07/2021  ? ? ?SURGICAL HISTORY: ?Past Surgical History:  ?Procedure Laterality Date  ? carpel tunnel    ? IR IMAGING GUIDED PORT INSERTION  10/10/2021  ? MASS BIOPSY Left 07/14/2021  ? Procedure: NECK MASS BIOPSY;  Surgeon: Melida Quitter, MD;  Location: [facility];  Service: ENT;  Laterality: Left;  ? ? ?SOCIAL HISTORY: ?Social History  ? ?Socioeconomic History  ? Marital status: Widowed  ?  Spouse name: Not  on file  ? Number of children: Not on file  ? Years of education: Not on file  ? Highest education level: Not on file  ?Occupational History  ? Not on file  ?Tobacco Use  ? Smoking status: Former  ?  Packs/day: 0.25  ?  Types: Cigarettes  ?  Quit date: 11/01/1986  ?  Years since quitting: 35.2  ? Smokeless tobacco: Former  ?  Types: Chew  ?  Quit date: 11/28/1987  ?Vaping Use  ? Vaping Use: Never used  ?Substance and Sexual Activity  ? Alcohol use: Yes  ?  Comment: rare  ? Drug use: No  ? Sexual activity: Not Currently  ?Other Topics Concern  ? Not on file  ?Social History Narrative  ? Not on file  ? ?Social Determinants of Health  ? ?Financial Resource Strain: Not on file  ?Food Insecurity: Not on file  ?Transportation Needs: Not on file  ?Physical Activity: Not on file  ?Stress: Not on file  ?Social Connections: Not on file  ?Intimate Partner Violence: Not on file  ? ? ?FAMILY HISTORY: ?Family History  ?Problem Relation Age of Onset  ? Healthy Mother   ? Healthy Father   ? Throat cancer Brother   ? ? ?ALLERGIES:  has No Known Allergies. ? ?MEDICATIONS:  ?Current Outpatient Medications  ?Medication Sig Dispense Refill  ? acetaminophen (TYLENOL) 325 MG tablet Take 2 tablets (650 mg total)  by mouth every 6 (six) hours as needed for mild pain (or Fever >/= 101). 20 tablet 0  ? allopurinol (ZYLOPRIM) 300 MG tablet TAKE 1 TABLET BY MOUTH EVERY DAY 30 tablet 5  ? ALPRAZolam (XANAX) 1 MG tablet Take 1 mg by mouth 3 (three) times daily as needed.    ? amLODipine (NORVASC) 10 MG tablet Take 10 mg by mouth daily.    ? apixaban (ELIQUIS) 5 MG TABS tablet Take 1 tablet (5 mg total) by mouth 2 (two) times daily. This replaces the Lovenox injections 60 tablet 3  ? feeding supplement (ENSURE ENLIVE / ENSURE PLUS) LIQD Take 237 mLs by mouth 2 (two) times daily between meals. 237 mL 12  ? lidocaine-prilocaine (EMLA) cream Apply 1 application topically as needed. 30-60 mins prior to port a cath access. Start using 2 weeks after port a  cath placement. 30 g 0  ? ondansetron (ZOFRAN) 8 MG tablet Take 1 tablet (8 mg total) by mouth 2 (two) times daily as needed for refractory nausea / vomiting. Start on day 3 after cyclophosphamide chemotherapy. 30 tablet 1  ? predniSONE (DELTASONE) 20 MG tablet Take 3 tablets (60 mg total) by mouth daily. Take with food on days 2-6 of chemotherapy. (Patient taking differently: Take 60 mg by mouth See admin instructions. Take with food on days 2-6 of chemotherapy.) 25 tablet 5  ? prochlorperazine (COMPAZINE) 10 MG tablet Take 1 tablet (10 mg total) by mouth every 6 (six) hours as needed (Nausea or vomiting). 30 tablet 6  ? sertraline (ZOLOFT) 50 MG tablet Take 50 mg by mouth daily.    ? sodium chloride flush (NS) 0.9 % SOLN 10-40 mLs by Intracatheter route as needed (flush). 1000 mL 0  ? Sodium Chloride-Sodium Bicarb (SODIUM BICARBONATE/SODIUM CHLORIDE) SOLN 1 application by Mouth Rinse route as needed for dry mouth. 1000 mL 0  ? Vitamin D, Ergocalciferol, (DRISDOL) 1.25 MG (50000 UNIT) CAPS capsule TAKE ONE CAPSULE BY MOUTH ONCE a WEEK 12 capsule 0  ? ?No current facility-administered medications for this visit.  ? ? ?REVIEW OF SYSTEMS:   ?10 Point review of Systems was done is negative except as noted above. ? ?PHYSICAL EXAMINATION: ?ECOG PERFORMANCE STATUS: 1 - Symptomatic but completely ambulatory ?.BP (!) 143/78   Pulse 91   Temp (!) 97.3 ?F (36.3 ?C)   Resp 18   Wt (!) 312 lb (141.5 kg)   SpO2 98%   BMI 46.07 kg/m?  ?NAD ?GENERAL:alert, in no acute distress and comfortable ?SKIN: no acute rashes, no significant lesions ?EYES: conjunctiva are pink and non-injected, sclera anicteric ?OROPHARYNX: MMM, no exudates, no oropharyngeal erythema or ulceration ?NECK: supple, no JVD ?LYMPH:  no palpable lymphadenopathy in the cervical, axillary or inguinal regions ?LUNGS: clear to auscultation b/l with normal respiratory effort ?HEART: regular rate & rhythm ?ABDOMEN:  normoactive bowel sounds , non tender, not  distended. ?Extremity: no pedal edema ?PSYCH: alert & oriented x 3 with fluent speech ?NEURO: no focal motor/sensory deficits ? ? ?LABORATORY DATA:  ?I have reviewed the data as listed ?. ? ?  Latest Ref Rng & Units 01/09/2022  ?  2:45 PM 11/29/2021  ?  9:29 AM 11/09/2021  ?  8:44 AM  ?CBC  ?WBC 4.0 - 10.5 K/uL 6.1   4.9   7.3    ?Hemoglobin 13.0 - 17.0 g/dL 12.1   11.0   12.0    ?Hematocrit 39.0 - 52.0 % 36.6   32.4   36.2    ?  Platelets 150 - 400 K/uL 225   362   381    ? ? ?. ? ?  Latest Ref Rng & Units 01/09/2022  ?  2:45 PM 11/29/2021  ?  9:29 AM 11/09/2021  ?  8:44 AM  ?CMP  ?Glucose 70 - 99 mg/dL 105   116   116    ?BUN 8 - 23 mg/dL _0 ?Creatinine 0.61 - 1.24 mg/dL 0.92   1.11   1.08    ?Sodium 135 - 145 mmol/L 138   140   140    ?Potassium 3.5 - 5.1 mmol/L 4.2   3.8   4.0    ?Chloride 98 - 111 mmol/L 100   104   103    ?CO2 22 - 32 mmol/L _1 ?Calcium 8.9 - 10.3 mg/dL 9.2   9.3   9.5    ?Total Protein 6.5 - 8.1 g/dL 7.2   6.8   6.8    ?Total Bilirubin 0.3 - 1.2 mg/dL 0.7   0.4   0.3    ?Alkaline Phos 38 - 126 U/L 56   59   58    ?AST 15 - 41 U/L 32   18   17    ?ALT 0 - 44 U/L _2 ? ?  ?SURGICAL PATHOLOGY  ?CASE: [id number]  ?PATIENT: Ariyon Borcherding  ?Surgical Pathology Report  ? ?Clinical History: neck mass (cm)  ? ?FINAL MICROSCOPIC DIAGNOSIS:  ? ?A. SOFT TISSUE MASS, LEFT NECK, BIOPSY:  ?- Non-Hodgkin B-cell lymphoma, see comment.  ? ?COMMENT:  ? ?The majority of the fragments have extensive crush artifact hampering  ?evaluation. One fragment is well preserved and demonstrates an atypical  ?lymphoid proliferation with predominately small irregular lymphocytes.  ?Immunohistochemistry is positive for CD20, bcl-2, bcl-6 and CD10. CD23  ?is largely negative. CyclinD1 and light chain in situ hybridization are  ?negative. Ki-67 is 20-25%. CD3 and CD5 highlight scattered T-cells. Flow  ?cytometry ([id number]) reveals predominately T-cells. Overall, the  ?findings are consistent with  a non-Hodgkin B-cell lymphoma, specifically  ?follicular lymphoma. There is only limited preserved tissue hampering  ?grading, but this area appears low grade (grade 1-2 of 3). A higher  ?grade process in the

## 2022-01-16 ENCOUNTER — Encounter: Payer: Self-pay | Admitting: Hematology

## 2022-01-16 ENCOUNTER — Other Ambulatory Visit: Payer: Self-pay | Admitting: Hematology

## 2022-01-18 DIAGNOSIS — C8221 Follicular lymphoma grade III, unspecified, lymph nodes of head, face, and neck: Secondary | ICD-10-CM | POA: Diagnosis not present

## 2022-01-23 ENCOUNTER — Inpatient Hospital Stay: Payer: Medicaid Other

## 2022-01-23 ENCOUNTER — Encounter: Payer: Self-pay | Admitting: Hematology

## 2022-01-23 ENCOUNTER — Other Ambulatory Visit: Payer: Self-pay

## 2022-01-23 ENCOUNTER — Ambulatory Visit
Admission: RE | Admit: 2022-01-23 | Discharge: 2022-01-23 | Disposition: A | Discharge status: Home / Self Care | Payer: Medicaid Other | Source: Ambulatory Visit | Attending: Radiation Oncology | Admitting: Radiation Oncology

## 2022-01-23 ENCOUNTER — Ambulatory Visit: Admission: RE | Admit: 2022-01-23 | Payer: Medicaid Other | Source: Ambulatory Visit

## 2022-01-23 DIAGNOSIS — C8221 Follicular lymphoma grade III, unspecified, lymph nodes of head, face, and neck: Secondary | ICD-10-CM | POA: Diagnosis present

## 2022-01-23 DIAGNOSIS — C8211 Follicular lymphoma grade II, lymph nodes of head, face, and neck: Secondary | ICD-10-CM | POA: Insufficient documentation

## 2022-01-23 DIAGNOSIS — Z7189 Other specified counseling: Secondary | ICD-10-CM | POA: Insufficient documentation

## 2022-01-23 DIAGNOSIS — C8251 Diffuse follicle center lymphoma, lymph nodes of head, face, and neck: Secondary | ICD-10-CM | POA: Diagnosis present

## 2022-01-23 DIAGNOSIS — Z452 Encounter for adjustment and management of vascular access device: Secondary | ICD-10-CM | POA: Insufficient documentation

## 2022-01-23 LAB — CBC WITH DIFFERENTIAL (CANCER CENTER ONLY)
Abs Immature Granulocytes: 0.02 10*3/uL (ref 0.00–0.07)
Basophils Absolute: 0.1 10*3/uL (ref 0.0–0.1)
Basophils Relative: 1 %
Eosinophils Absolute: 0.5 10*3/uL (ref 0.0–0.5)
Eosinophils Relative: 10 %
HCT: 37.6 % — ABNORMAL LOW (ref 39.0–52.0)
Hemoglobin: 12.4 g/dL — ABNORMAL LOW (ref 13.0–17.0)
Immature Granulocytes: 0 %
Lymphocytes Relative: 20 %
Lymphs Abs: 1 10*3/uL (ref 0.7–4.0)
MCH: 30.7 pg (ref 26.0–34.0)
MCHC: 33 g/dL (ref 30.0–36.0)
MCV: 93.1 fL (ref 80.0–100.0)
Monocytes Absolute: 0.6 10*3/uL (ref 0.1–1.0)
Monocytes Relative: 13 %
Neutro Abs: 2.6 10*3/uL (ref 1.7–7.7)
Neutrophils Relative %: 56 %
Platelet Count: 198 10*3/uL (ref 150–400)
RBC: 4.04 MIL/uL — ABNORMAL LOW (ref 4.22–5.81)
RDW: 15 % (ref 11.5–15.5)
WBC Count: 4.7 10*3/uL (ref 4.0–10.5)
nRBC: 0 % (ref 0.0–0.2)

## 2022-01-23 LAB — CMP (CANCER CENTER ONLY)
ALT: 19 U/L (ref 0–44)
AST: 26 U/L (ref 15–41)
Albumin: 4.1 g/dL (ref 3.5–5.0)
Alkaline Phosphatase: 58 U/L (ref 38–126)
Anion gap: 6 (ref 5–15)
BUN: 16 mg/dL (ref 8–23)
CO2: 29 mmol/L (ref 22–32)
Calcium: 9.3 mg/dL (ref 8.9–10.3)
Chloride: 105 mmol/L (ref 98–111)
Creatinine: 1.01 mg/dL (ref 0.61–1.24)
GFR, Estimated: >60 mL/min (ref >60)
Glucose, Bld: 89 mg/dL (ref 70–99)
Potassium: 3.9 mmol/L (ref 3.5–5.1)
Sodium: 140 mmol/L (ref 135–145)
Total Bilirubin: 0.3 mg/dL (ref 0.3–1.2)
Total Protein: 7.7 g/dL (ref 6.5–8.1)

## 2022-01-23 MED ORDER — SODIUM CHLORIDE 0.9% FLUSH
10.0000 mL | Freq: Once | INTRAVENOUS | Status: AC
  Administered 2022-01-23: 10 mL

## 2022-01-23 MED ORDER — HEPARIN SOD (PORK) LOCK FLUSH 100 UNIT/ML IV SOLN
500.0000 [IU] | Freq: Once | INTRAVENOUS | Status: AC
  Administered 2022-01-23: 500 [IU]

## 2022-01-24 ENCOUNTER — Ambulatory Visit
Admission: RE | Admit: 2022-01-24 | Discharge: 2022-01-24 | Disposition: A | Discharge status: Home / Self Care | Payer: Medicaid Other | Source: Ambulatory Visit | Attending: Radiation Oncology | Admitting: Radiation Oncology

## 2022-01-24 DIAGNOSIS — C8251 Diffuse follicle center lymphoma, lymph nodes of head, face, and neck: Secondary | ICD-10-CM | POA: Diagnosis not present

## 2022-01-25 ENCOUNTER — Ambulatory Visit
Admission: RE | Admit: 2022-01-25 | Discharge: 2022-01-25 | Disposition: A | Discharge status: Home / Self Care | Payer: Medicaid Other | Source: Ambulatory Visit | Attending: Radiation Oncology | Admitting: Radiation Oncology

## 2022-01-25 ENCOUNTER — Other Ambulatory Visit: Payer: Self-pay

## 2022-01-25 DIAGNOSIS — C8251 Diffuse follicle center lymphoma, lymph nodes of head, face, and neck: Secondary | ICD-10-CM | POA: Diagnosis not present

## 2022-01-28 ENCOUNTER — Encounter: Payer: Self-pay | Admitting: Hematology

## 2022-01-28 ENCOUNTER — Ambulatory Visit
Admission: RE | Admit: 2022-01-28 | Discharge: 2022-01-28 | Disposition: A | Discharge status: Home / Self Care | Payer: Medicaid Other | Source: Ambulatory Visit | Attending: Radiation Oncology | Admitting: Radiation Oncology

## 2022-01-28 ENCOUNTER — Other Ambulatory Visit: Payer: Self-pay

## 2022-01-28 DIAGNOSIS — C8251 Diffuse follicle center lymphoma, lymph nodes of head, face, and neck: Secondary | ICD-10-CM | POA: Diagnosis not present

## 2022-01-29 ENCOUNTER — Telehealth: Payer: Self-pay | Admitting: *Deleted

## 2022-01-29 ENCOUNTER — Ambulatory Visit
Admission: RE | Admit: 2022-01-29 | Discharge: 2022-01-29 | Disposition: A | Discharge status: Home / Self Care | Payer: Medicaid Other | Source: Ambulatory Visit | Attending: Radiation Oncology | Admitting: Radiation Oncology

## 2022-01-29 DIAGNOSIS — C8251 Diffuse follicle center lymphoma, lymph nodes of head, face, and neck: Secondary | ICD-10-CM | POA: Diagnosis not present

## 2022-01-29 NOTE — Telephone Encounter (Signed)
CALLED PATIENT TO ASK ABOUT DOING LAB THIS WEEK, SPOKE WITH PATIENT'S SISTER LYNN GILLEY AND SHE STATED TOMORROW AFTER TX., NOTIFIED JENNIFER MALMFELT HEAD AND NECK NAVIGATOR ?

## 2022-01-30 ENCOUNTER — Ambulatory Visit
Admission: RE | Admit: 2022-01-30 | Discharge: 2022-01-30 | Disposition: A | Discharge status: Home / Self Care | Payer: Medicaid Other | Source: Ambulatory Visit | Attending: Radiation Oncology | Admitting: Radiation Oncology

## 2022-01-30 ENCOUNTER — Other Ambulatory Visit: Payer: Self-pay

## 2022-01-30 DIAGNOSIS — C8221 Follicular lymphoma grade III, unspecified, lymph nodes of head, face, and neck: Secondary | ICD-10-CM

## 2022-01-30 DIAGNOSIS — C8251 Diffuse follicle center lymphoma, lymph nodes of head, face, and neck: Secondary | ICD-10-CM

## 2022-01-30 DIAGNOSIS — Z7189 Other specified counseling: Secondary | ICD-10-CM

## 2022-01-30 LAB — CBC WITH DIFFERENTIAL (CANCER CENTER ONLY)
Abs Immature Granulocytes: 0.02 10*3/uL (ref 0.00–0.07)
Basophils Absolute: 0 10*3/uL (ref 0.0–0.1)
Basophils Relative: 1 %
Eosinophils Absolute: 0.3 10*3/uL (ref 0.0–0.5)
Eosinophils Relative: 7 %
HCT: 38 % — ABNORMAL LOW (ref 39.0–52.0)
Hemoglobin: 12.6 g/dL — ABNORMAL LOW (ref 13.0–17.0)
Immature Granulocytes: 1 %
Lymphocytes Relative: 19 %
Lymphs Abs: 0.8 10*3/uL (ref 0.7–4.0)
MCH: 30.4 pg (ref 26.0–34.0)
MCHC: 33.2 g/dL (ref 30.0–36.0)
MCV: 91.8 fL (ref 80.0–100.0)
Monocytes Absolute: 0.6 10*3/uL (ref 0.1–1.0)
Monocytes Relative: 13 %
Neutro Abs: 2.5 10*3/uL (ref 1.7–7.7)
Neutrophils Relative %: 59 %
Platelet Count: 187 10*3/uL (ref 150–400)
RBC: 4.14 MIL/uL — ABNORMAL LOW (ref 4.22–5.81)
RDW: 14.7 % (ref 11.5–15.5)
WBC Count: 4.2 10*3/uL (ref 4.0–10.5)
nRBC: 0 % (ref 0.0–0.2)

## 2022-01-30 LAB — CMP (CANCER CENTER ONLY)
ALT: 19 U/L (ref 0–44)
AST: 26 U/L (ref 15–41)
Albumin: 4.3 g/dL (ref 3.5–5.0)
Alkaline Phosphatase: 58 U/L (ref 38–126)
Anion gap: 8 (ref 5–15)
BUN: 18 mg/dL (ref 8–23)
CO2: 30 mmol/L (ref 22–32)
Calcium: 9.5 mg/dL (ref 8.9–10.3)
Chloride: 102 mmol/L (ref 98–111)
Creatinine: 1.07 mg/dL (ref 0.61–1.24)
GFR, Estimated: >60 mL/min (ref >60)
Glucose, Bld: 100 mg/dL — ABNORMAL HIGH (ref 70–99)
Potassium: 4.3 mmol/L (ref 3.5–5.1)
Sodium: 140 mmol/L (ref 135–145)
Total Bilirubin: 0.5 mg/dL (ref 0.3–1.2)
Total Protein: 7.6 g/dL (ref 6.5–8.1)

## 2022-01-31 ENCOUNTER — Ambulatory Visit
Admission: RE | Admit: 2022-01-31 | Discharge: 2022-01-31 | Disposition: A | Discharge status: Home / Self Care | Payer: Medicaid Other | Source: Ambulatory Visit | Attending: Radiation Oncology | Admitting: Radiation Oncology

## 2022-01-31 DIAGNOSIS — C8251 Diffuse follicle center lymphoma, lymph nodes of head, face, and neck: Secondary | ICD-10-CM | POA: Diagnosis not present

## 2022-01-31 LAB — TSH: TSH: 1.027 u[IU]/mL (ref 0.320–4.118)

## 2022-02-01 ENCOUNTER — Other Ambulatory Visit: Payer: Self-pay

## 2022-02-01 ENCOUNTER — Ambulatory Visit
Admission: RE | Admit: 2022-02-01 | Discharge: 2022-02-01 | Disposition: A | Discharge status: Home / Self Care | Payer: Medicaid Other | Source: Ambulatory Visit | Attending: Radiation Oncology | Admitting: Radiation Oncology

## 2022-02-01 DIAGNOSIS — C8251 Diffuse follicle center lymphoma, lymph nodes of head, face, and neck: Secondary | ICD-10-CM | POA: Diagnosis not present

## 2022-02-04 ENCOUNTER — Other Ambulatory Visit: Payer: Self-pay

## 2022-02-04 ENCOUNTER — Ambulatory Visit
Admission: RE | Admit: 2022-02-04 | Discharge: 2022-02-04 | Disposition: A | Discharge status: Home / Self Care | Payer: Medicaid Other | Source: Ambulatory Visit | Attending: Radiation Oncology | Admitting: Radiation Oncology

## 2022-02-04 ENCOUNTER — Other Ambulatory Visit: Payer: Self-pay | Admitting: Radiation Oncology

## 2022-02-04 DIAGNOSIS — C8251 Diffuse follicle center lymphoma, lymph nodes of head, face, and neck: Secondary | ICD-10-CM | POA: Diagnosis not present

## 2022-02-04 DIAGNOSIS — C8221 Follicular lymphoma grade III, unspecified, lymph nodes of head, face, and neck: Secondary | ICD-10-CM

## 2022-02-04 MED ORDER — LIDOCAINE VISCOUS HCL 2 % MT SOLN: OROMUCOSAL | 2 refills | Status: AC

## 2022-02-05 ENCOUNTER — Other Ambulatory Visit: Payer: Self-pay

## 2022-02-05 ENCOUNTER — Ambulatory Visit
Admission: RE | Admit: 2022-02-05 | Discharge: 2022-02-05 | Disposition: A | Discharge status: Home / Self Care | Payer: Medicaid Other | Source: Ambulatory Visit | Attending: Radiation Oncology | Admitting: Radiation Oncology

## 2022-02-05 ENCOUNTER — Encounter: Payer: Self-pay | Admitting: Radiation Oncology

## 2022-02-05 DIAGNOSIS — C8251 Diffuse follicle center lymphoma, lymph nodes of head, face, and neck: Secondary | ICD-10-CM | POA: Diagnosis not present

## 2022-02-05 LAB — RAD ONC ARIA SESSION SUMMARY
Course Elapsed Days: 13
Plan Fractions Treated to Date: 10
Plan Prescribed Dose Per Fraction: 2.5 Gy
Plan Total Fractions Prescribed: 10
Plan Total Prescribed Dose: 25 Gy
Reference Point Dosage Given to Date: 25 Gy
Reference Point Session Dosage Given: 2.5 Gy
Session Number: 10

## 2022-02-05 NOTE — Progress Notes (Signed)
Oncology Nurse Navigator Documentation  ? ?Met with Joe [name] after final RT to offer support and to celebrate end of radiation treatment.   ?Provided verbal post-RT guidance: ?Importance of keeping all follow-up appts. ?Explained my role as navigator will continue for several more months, encouraged him to call me with needs/concerns.   ? ?Joe Asa RN, BSN, OCN ?Head & Neck Oncology Nurse Navigator ?[facility] ?Phone # [phone]  ?Fax # [phone]   ?

## 2022-02-07 ENCOUNTER — Other Ambulatory Visit: Payer: Self-pay | Admitting: Radiation Oncology

## 2022-02-07 MED ORDER — HYDROCODONE-ACETAMINOPHEN 7.5-325 MG/15ML PO SOLN: 10.0000 mL | Freq: Four times a day (QID) | ORAL | 0 refills | Status: AC | PRN

## 2022-02-11 ENCOUNTER — Ambulatory Visit: Payer: 59 | Admitting: Pulmonary Disease

## 2022-02-13 ENCOUNTER — Other Ambulatory Visit: Payer: Self-pay

## 2022-02-13 DIAGNOSIS — C8221 Follicular lymphoma grade III, unspecified, lymph nodes of head, face, and neck: Secondary | ICD-10-CM

## 2022-02-13 NOTE — Progress Notes (Signed)
Oncology Nurse Navigator Documentation  ? ?Mr. Blanchet called me today reporting continued severe pain to his throat causing difficulty swallowing since completing radiation on 02/05/22. He reports that he is not able to drink much water and only able to eat small amounts of oatmeal and thin broth type soups. His weight on 4/17 during treatment was 301 lbs but today on his home scale it was 286 lbs. I encouraged him to use the lidocaine rinse and the pain medication he has been prescribed and drink high protein drinks such as ensure, boost, or carnation instant breakfast. He was offered an appointment with Dr. Isidore Moos on Friday, 4/28 at 10:40 for evaluation and he accepted. I encouraged him to go to the Emergency Department for evaluation if he became weaker, dizzy, or developed any other symptoms before his appointment Friday. He voiced his understanding.  ? ?Harlow Asa RN, BSN, OCN ?Head & Neck Oncology Nurse Navigator ?[facility] ?Phone # [phone]  ?Fax # [phone]  ?

## 2022-02-14 ENCOUNTER — Telehealth: Payer: Self-pay | Admitting: Hematology

## 2022-02-14 NOTE — Telephone Encounter (Signed)
.  Called patient to schedule appointment per 4/27 inbasket, patient is aware of date and time.   ?

## 2022-02-14 NOTE — Progress Notes (Incomplete)
Mr. Tomko presents today for follow-up since completing radiation to his left neck nodes on 02/05/2022 ? ?Pain:  ?Swallowing:  ?Weight:  ?Other issues of note: ?

## 2022-02-15 ENCOUNTER — Ambulatory Visit: Payer: Medicaid Other

## 2022-02-15 ENCOUNTER — Inpatient Hospital Stay: Payer: Medicaid Other | Admitting: Dietician

## 2022-02-15 ENCOUNTER — Telehealth: Payer: Self-pay

## 2022-02-15 ENCOUNTER — Other Ambulatory Visit: Payer: Self-pay

## 2022-02-15 ENCOUNTER — Telehealth: Payer: Self-pay | Admitting: Dietician

## 2022-02-15 ENCOUNTER — Ambulatory Visit: Payer: Medicaid Other | Admitting: Radiation Oncology

## 2022-02-15 DIAGNOSIS — C8221 Follicular lymphoma grade III, unspecified, lymph nodes of head, face, and neck: Secondary | ICD-10-CM

## 2022-02-15 NOTE — Telephone Encounter (Signed)
Nutrition Follow-up: ? ?Patient with grade 3 follicular lymphoma. He completed 6 cycles of chemotherapy with R-CHOP followed by radiation therapy to bulky disease in neck. Final radiation therapy completed on 4/18. ? ?Spoke with patient sister Joe [name]) for nutrition follow-up. Patient is currently sleeping. Joe [name] reports he has not been resting well at night due. Patient has been having worsening sore throat for the last 3 weeks. His oral intake has been declining during this time with last week being "the worst." Joe [name] recalls patient taking small sips of soups, Boost, thin oatmeal, vanilla ice cream, water. He was prescribed lidocaine solution, but this did not work. Patient also prescribed hycet. This was helpful, but only for 30-45 minutes. Sister reports he has been using OTC throat lozenges, but these have been difficult to find in their area. She reports cold medication shelves have been bare the last couple of months. Joe [name] reports patient started turning a corner yesterday. He was able to eat more and recalls drinking the gravy from chicken and dumplings, one boost, at least one bottle of water, glass of milk, and chicken noodle soup. Joe [name] does not feel patient needs IV fluids at this time.  ? ? ?Medications: Hycet, Xylocaine ? ?Labs: 4/12 labs reviewed ? ?Anthropometrics: Per sister, on 4/27 pt 286 lb on home scale. Last aria weight 301.4 lbs on 4/17 indicating 15 lb (5%) wt loss in 10 days; significant ? ? ? ?NUTRITION DIAGNOSIS: Unintentional weight loss ongoing. Patient likely meets criteria for severe acute malnutrition. Will plan to complete NFPE at 5/5 follow-up to assess fat and muscle depletion  ? ? ? ?INTERVENTION:  ?Encouraged sips and bites of high protein high calorie foods q2h  ?Suggested trying lidocaine rinses given some improvement to throat pain ?Educated on ways to add calories/protein to foods (using whole milk in oatmeal, adding boost to ice cream, bone broth, adding canned chicken to soups)   ?Provided contact information  ?  ? ?MONITORING, EVALUATION, GOAL: weight trends, intake  ? ? ?NEXT VISIT: Friday May 5 after MD visit ? ? ? ?

## 2022-02-15 NOTE — Telephone Encounter (Signed)
Spoke with patient's sister Joe [name] to see if patient could come in a little earlier before his F/U today to have some lab work drawn. Joe [name] stated patient was feeling better and no longer wanted to come in for his appointment with Dr. Isidore Moos. Stressed the importance of patient keeping appointments since he called earlier in the week complaining of terrible sore throat, feeling poorly, and being unable to eat/drink. Informed Joe [name] that Dr. Isidore Moos wanted to get orthostatic vitals signs, lab work, and do a thorough assessment on patient. Joe [name] verbalized understanding, but reiterated that patient was feeling better (stated he had started eating/drinking last night) and did not want to make the hour long commute to come into the clinic. She said he was willing to keep his 2 week F/U scheduled for 02/22/22. Reiterated that the medical advice from his care team would be to keep his appointments for today, but ultimately the choice was up to Joe [name]. Joe [name] again confirmed that patient would not be coming into the clinic today. She denied any other needs at this time.  ? ?Above information relayed to Dr. Isidore Moos and care team ?

## 2022-02-18 ENCOUNTER — Encounter: Payer: Medicaid Other | Admitting: Nutrition

## 2022-02-21 NOTE — Progress Notes (Signed)
Mr. Borowiak presents today for follow-up after completing radiation to his left neck on 02/05/2022 ? ?Pain issues, if any: Reports throat pain has improved significantly ?Using a feeding tube?: N/A ?Weight changes, if any:  ?Wt Readings from Last 3 Encounters:  ?02/22/22 288 lb 4 oz (130.7 kg)  ?01/14/22 (!) 313 lb (142 kg)  ?01/09/22 (!) 312 lb (141.5 kg)  ? ?Swallowing issues, if any: Currently only able to tolerate softer foods (like oatmeal or creamed potatoes) or liquids. Appetite has waned substantially due to altered sense or taste, and lingering salty aftertaste  ?Smoking or chewing tobacco? None ?Using fluoride trays daily? N/A--denies any dental concerns or sores in his mouth ?Last ENT visit was on: Not since diagnosis ?Other notable issues, if any: Scheduled for lab work and F/U with medical oncologist Dr. Irene Limbo on 04/02/2022. Reports fatigue has improved now that he is able to tolerate more oral intake. Denies any ear or jaw pain, or difficulty opening his mouth.  ? ? ? ? ?

## 2022-02-22 ENCOUNTER — Ambulatory Visit
Admission: RE | Admit: 2022-02-22 | Discharge: 2022-02-22 | Disposition: A | Discharge status: Home / Self Care | Payer: Medicaid Other | Source: Ambulatory Visit | Attending: Radiation Oncology | Admitting: Radiation Oncology

## 2022-02-22 ENCOUNTER — Other Ambulatory Visit: Payer: Self-pay

## 2022-02-22 ENCOUNTER — Encounter: Payer: Self-pay | Admitting: Radiation Oncology

## 2022-02-22 ENCOUNTER — Telehealth: Payer: Self-pay

## 2022-02-22 ENCOUNTER — Inpatient Hospital Stay: Payer: BC Managed Care – PPO | Attending: Hematology | Admitting: Dietician

## 2022-02-22 VITALS — BP 121/83 | HR 127 | Temp 96.2°F | Resp 18 | Ht 69.0 in | Wt 288.2 lb

## 2022-02-22 DIAGNOSIS — C8221 Follicular lymphoma grade III, unspecified, lymph nodes of head, face, and neck: Secondary | ICD-10-CM

## 2022-02-22 LAB — BASIC METABOLIC PANEL - CANCER CENTER ONLY
Anion gap: 8 (ref 5–15)
BUN: 12 mg/dL (ref 8–23)
CO2: 30 mmol/L (ref 22–32)
Calcium: 9.7 mg/dL (ref 8.9–10.3)
Chloride: 101 mmol/L (ref 98–111)
Creatinine: 1.25 mg/dL — ABNORMAL HIGH (ref 0.61–1.24)
GFR, Estimated: >60 mL/min (ref >60)
Glucose, Bld: 118 mg/dL — ABNORMAL HIGH (ref 70–99)
Potassium: 4.1 mmol/L (ref 3.5–5.1)
Sodium: 139 mmol/L (ref 135–145)

## 2022-02-22 LAB — CBC WITH DIFFERENTIAL (CANCER CENTER ONLY)
Abs Immature Granulocytes: 0.02 10*3/uL (ref 0.00–0.07)
Basophils Absolute: 0.1 10*3/uL (ref 0.0–0.1)
Basophils Relative: 1 %
Eosinophils Absolute: 0.2 10*3/uL (ref 0.0–0.5)
Eosinophils Relative: 3 %
HCT: 43 % (ref 39.0–52.0)
Hemoglobin: 14.3 g/dL (ref 13.0–17.0)
Immature Granulocytes: 0 %
Lymphocytes Relative: 17 %
Lymphs Abs: 1.1 10*3/uL (ref 0.7–4.0)
MCH: 29.6 pg (ref 26.0–34.0)
MCHC: 33.3 g/dL (ref 30.0–36.0)
MCV: 89 fL (ref 80.0–100.0)
Monocytes Absolute: 0.7 10*3/uL (ref 0.1–1.0)
Monocytes Relative: 12 %
Neutro Abs: 4.1 10*3/uL (ref 1.7–7.7)
Neutrophils Relative %: 67 %
Platelet Count: 208 10*3/uL (ref 150–400)
RBC: 4.83 MIL/uL (ref 4.22–5.81)
RDW: 14.4 % (ref 11.5–15.5)
WBC Count: 6.1 10*3/uL (ref 4.0–10.5)
nRBC: 0 % (ref 0.0–0.2)

## 2022-02-22 NOTE — Progress Notes (Signed)
Oncology Nurse Navigator Documentation  ? ?I met with Joe [name] before his scheduled follow up with Dr. Isidore Moos today to see how he is doing. He tells me that he feels 100% better than the last time we spoke. He is able to eat softer foods and drink liquids without using pain medication. He will meet with nutrition today as well. I voiced how happy I was that he is feeling better and told him that he could call me anytime for questions or concerns. He is scheduled to see Dr. Irene Limbo on 6/13 for labs and follow up visit.  ? ?Joe Asa RN, BSN, OCN ?Head & Neck Oncology Nurse Navigator ?[facility] ?Phone # [phone]  ?Fax # [phone]  ?

## 2022-02-22 NOTE — Progress Notes (Signed)
Nutrition Follow-up: ? ?Patient with grade 3 follicular lymphoma. He completed 6 cycles of chemotherapy with R-CHOP followed by radiation therapy to bulky disease in neck. Final radiation therapy completed on 4/18. ? ?Met with patient in radiation clinic. He reports doing much better. His taste is  coming back and throat pain has significantly improved. Patient reports one small spot that continues to aggravate him. He is able to swallow without difficulty. Patient reports recent decline in oral intake was "more a mental thing." Patient recalls eating bread with oatmeal for breakfast one morning and small piece of bread got stuck. He finally got it down. This was scary and caused his throat to hurt even more. Patient did not eat the following four days due to this. He continued sipping on water, Boost, hot chocolate. Patient reports he has been eating more this week. He recalls bowl of oatmeal made with milk, slice of lite bread, glass of milk, hot chocolate for breakfast, Boost for lunch, creamed potatoes/gravy and Boost for supper, cup of pudding before bed.  ? ?Medications: reviewed  ? ?Labs: no new labs  ? ?Anthropometrics: Weight 288.3 lb today decreased 4.3% (13 lbs) in 18 days, 8% (25 lbs) in the last 6 weeks.This is significant ? ?4/17 - 301.4 ?3/27 - 313 lb  ? ? ?NUTRITION DIAGNOSIS: Unintentional weight loss ongoing  ? ? ?INTERVENTION:  ?Reinforced importance of adequate calories and increased protein for healing ?Reviewed foods with protein, educated to include protein source with all meals and snacks ?Encouraged soft moist high protein foods - handout with ideas provided ?Continue drinking Boost Plus/equivalent, recommend increasing to 3/day ?One complimentary case of Ensure Plus High Protein provided ?Patient has contact information  ?  ? ?MONITORING, EVALUATION, GOAL: weight trends, intake  ? ? ?NEXT VISIT: Tuesday June 13 after MD visit ? ? ? ?

## 2022-02-22 NOTE — Progress Notes (Signed)
?Radiation Oncology         [phone] ?________________________________ ? ?Name: Joe [name] MRN: [id number]  ?Date: 02/22/2022  DOB: Mar 06, 1956 ? ?Follow-Up Visit Note ? ?Outpatient ? ?CC: Celene Squibb, MD  Brunetta Genera, MD ? ?Diagnosis and Prior Radiotherapy:  ?  ICD-10-CM   ?1. 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  ? ? ?CHIEF COMPLAINT: Here for follow-up and surveillance of head and neck lymphoma ? ?Narrative:  The patient returns today for routine follow-up.   Joe [name] presents today for follow-up after completing radiation to his left neck on 02/05/2022 ? ?Pain issues, if any: Reports throat pain has improved significantly ?Using a feeding tube?: N/A ?Weight changes, if any:  ?Wt Readings from Last 3 Encounters:  ?02/22/22 288 lb 4 oz (130.7 kg)  ?01/14/22 (!) 313 lb (142 kg)  ?01/09/22 (!) 312 lb (141.5 kg)  ? ?Swallowing issues, if any: Currently only able to tolerate softer foods (like oatmeal or creamed potatoes) or liquids. Appetite has waned substantially due to altered sense or taste, and lingering salty aftertaste  ?Smoking or chewing tobacco? None ?Using fluoride trays daily? N/A--denies any dental concerns or sores in his mouth ?Last ENT visit was on: Not since diagnosis ?Other notable issues, if any: Scheduled for lab work and F/U with medical oncologist Dr. Irene Limbo on 04/02/2022. Reports fatigue has improved now that he is able to tolerate more oral intake. Denies any ear or jaw pain, or difficulty opening his mouth.  ? ?He reports that he is drinking plenty of clear fluids.  His urine is not very dark.  He is a little tachycardic today which is not typical for him ?                             ? ?ALLERGIES:  has No Known Allergies. ? ?Meds: ?Current Outpatient Medications  ?Medication Sig Dispense Refill  ? lidocaine (XYLOCAINE) 2 % solution Patient: Mix 1part 2% viscous lidocaine, 1part H20. Swallow 73m of diluted mixture, 361m before meals and at  bedtime, up to QID 200 mL 2  ? acetaminophen (TYLENOL) 325 MG tablet Take 2 tablets (650 mg total) by mouth every 6 (six) hours as needed for mild pain (or Fever >/= 101). 20 tablet 0  ? allopurinol (ZYLOPRIM) 300 MG tablet TAKE 1 TABLET BY MOUTH EVERY DAY 30 tablet 5  ? ALPRAZolam (XANAX) 1 MG tablet Take 1 mg by mouth 3 (three) times daily as needed.    ? amLODipine (NORVASC) 10 MG tablet Take 10 mg by mouth daily.    ? apixaban (ELIQUIS) 5 MG TABS tablet Take 1 tablet (5 mg total) by mouth 2 (two) times daily. This replaces the Lovenox injections 60 tablet 3  ? feeding supplement (ENSURE ENLIVE / ENSURE PLUS) LIQD Take 237 mLs by mouth 2 (two) times daily between meals. 237 mL 12  ? HYDROcodone-acetaminophen (HYCET) 7.5-325 mg/15 ml solution Take 10 mLs by mouth 4 (four) times daily as needed for moderate pain. 120 mL 0  ? lidocaine-prilocaine (EMLA) cream Apply 1 application topically as needed. 30-60 mins prior to port a cath access. Start using 2 weeks after port a cath placement. 30 g 0  ? ondansetron (ZOFRAN) 8 MG tablet Take 1 tablet (8 mg total) by mouth 2 (two) times daily as needed for refractory nausea / vomiting. Start on day 3 after cyclophosphamide chemotherapy.  30 tablet 1  ? predniSONE (DELTASONE) 20 MG tablet Take 3 tablets (60 mg total) by mouth daily. Take with food on days 2-6 of chemotherapy. (Patient taking differently: Take 60 mg by mouth See admin instructions. Take with food on days 2-6 of chemotherapy.) 25 tablet 5  ? prochlorperazine (COMPAZINE) 10 MG tablet Take 1 tablet (10 mg total) by mouth every 6 (six) hours as needed (Nausea or vomiting). 30 tablet 6  ? sertraline (ZOLOFT) 50 MG tablet Take 50 mg by mouth daily.    ? sodium chloride flush (NS) 0.9 % SOLN 10-40 mLs by Intracatheter route as needed (flush). 1000 mL 0  ? Sodium Chloride-Sodium Bicarb (SODIUM BICARBONATE/SODIUM CHLORIDE) SOLN 1 application by Mouth Rinse route as needed for dry mouth. 1000 mL 0  ? Vitamin D,  Ergocalciferol, (DRISDOL) 1.25 MG (50000 UNIT) CAPS capsule TAKE ONE CAPSULE BY MOUTH ONCE a WEEK 12 capsule 0  ? ?No current facility-administered medications for this encounter.  ? ? ?Physical Findings: ?The patient is in no acute distress. Patient is alert and oriented. ? height is '5\' 9"'$  (1.753 m) and weight is 288 lb 4 oz (130.7 kg). His temporal temperature is 96.2 ?F (35.7 ?C) (abnormal). His blood pressure is 121/83 and his pulse is 127 (abnormal). His respiration is 18 and oxygen saturation is 97%. [name]    ?HEENT: No visible tumor in the throat.  No thrush. ?NECK: No palpable adenopathy.  There are some subcutaneous cystic masses in the posterior left neck the patient reports are chronic ?Heart: Tachycardic but regular in rhythm with no murmurs ?Chest clear to auscultation bilaterally ? ? ?Lab Findings: ?Lab Results  ?Component Value Date  ? WBC 4.2 01/30/2022  ? HGB 12.6 (L) 01/30/2022  ? HCT 38.0 (L) 01/30/2022  ? MCV 91.8 01/30/2022  ? PLT 187 01/30/2022  ? ? ?Radiographic Findings: ?No results found. ? ?Impression/Plan: He is doing well symptomatically after completing radiation therapy.  He initially was having issues with a sore throat and poor p.o. intake but these issues have largely resolved.  Today he comes in with evidence of interval weight loss and tachycardia.  Orthostatic vitals were obtained as well.  His blood pressure was 121/83 sitting and 99/75 standing.  Pulse was 127 sitting and 138 standing.  We will go ahead and get stat labs to see if there is any evidence of acute renal failure.  If appropriate he may get IV fluids today.  Otherwise he knows that he needs to increase his fluid intake with a goal of 100 ounces of water daily. ? ?He will follow-up with medical oncology and I will see him back on an as-needed basis.  He is pleased with this plan. ? ?On date of service, in total, I spent 30 minutes on this encounter. Patient was seen in person.   ?_____________________________________ ? ? ?Eppie Gibson, MD ? ?

## 2022-02-22 NOTE — Telephone Encounter (Signed)
Left a VM on Joe [name]'s cell-phone updating him of his lab results from today. Informed him that Dr. Isidore Moos recommends he increase his clear fluid intake, and aim to try and consume ~100 oz/day. Asked that he call me back directly should he find he's struggling to meet that fluid recommendation ? ?Also called and spoke with patient's sister Joe [name]. Relayed above information. She stated she would monitor/encourage patient's fluid intake and reach out to our team if she felt he was struggling to meet the recommended volume. Denied any other needs or concerns at the time ?

## 2022-03-08 ENCOUNTER — Other Ambulatory Visit: Payer: Self-pay | Admitting: Hematology

## 2022-03-12 ENCOUNTER — Encounter: Payer: Self-pay | Admitting: Hematology

## 2022-03-14 ENCOUNTER — Encounter: Payer: Self-pay | Admitting: Hematology

## 2022-03-24 ENCOUNTER — Encounter (HOSPITAL_COMMUNITY): Payer: Self-pay

## 2022-03-24 ENCOUNTER — Emergency Department (HOSPITAL_COMMUNITY): Payer: Medicaid Other

## 2022-03-24 ENCOUNTER — Encounter: Payer: Self-pay | Admitting: Hematology

## 2022-03-24 ENCOUNTER — Inpatient Hospital Stay (HOSPITAL_COMMUNITY)
Admission: EM | Admit: 2022-03-24 | Discharge: 2022-03-28 | DRG: 872 | Disposition: A | Discharge status: Home Health | Payer: Medicaid Other | Attending: Family Medicine | Admitting: Family Medicine

## 2022-03-24 ENCOUNTER — Other Ambulatory Visit: Payer: Self-pay

## 2022-03-24 DIAGNOSIS — I82402 Acute embolism and thrombosis of unspecified deep veins of left lower extremity: Secondary | ICD-10-CM

## 2022-03-24 DIAGNOSIS — Z79899 Other long term (current) drug therapy: Secondary | ICD-10-CM

## 2022-03-24 DIAGNOSIS — Z86711 Personal history of pulmonary embolism: Secondary | ICD-10-CM

## 2022-03-24 DIAGNOSIS — Z923 Personal history of irradiation: Secondary | ICD-10-CM | POA: Diagnosis not present

## 2022-03-24 DIAGNOSIS — Z9221 Personal history of antineoplastic chemotherapy: Secondary | ICD-10-CM

## 2022-03-24 DIAGNOSIS — N179 Acute kidney failure, unspecified: Secondary | ICD-10-CM | POA: Diagnosis present

## 2022-03-24 DIAGNOSIS — A419 Sepsis, unspecified organism: Secondary | ICD-10-CM | POA: Diagnosis not present

## 2022-03-24 DIAGNOSIS — F32A Depression, unspecified: Secondary | ICD-10-CM | POA: Diagnosis present

## 2022-03-24 DIAGNOSIS — R739 Hyperglycemia, unspecified: Secondary | ICD-10-CM | POA: Diagnosis present

## 2022-03-24 DIAGNOSIS — Z87891 Personal history of nicotine dependence: Secondary | ICD-10-CM

## 2022-03-24 DIAGNOSIS — Z8572 Personal history of non-Hodgkin lymphomas: Secondary | ICD-10-CM

## 2022-03-24 DIAGNOSIS — R7881 Bacteremia: Secondary | ICD-10-CM

## 2022-03-24 DIAGNOSIS — R531 Weakness: Secondary | ICD-10-CM

## 2022-03-24 DIAGNOSIS — Z7901 Long term (current) use of anticoagulants: Secondary | ICD-10-CM | POA: Diagnosis not present

## 2022-03-24 DIAGNOSIS — Z86718 Personal history of other venous thrombosis and embolism: Secondary | ICD-10-CM

## 2022-03-24 DIAGNOSIS — Z6841 Body Mass Index (BMI) 40.0 and over, adult: Secondary | ICD-10-CM

## 2022-03-24 DIAGNOSIS — M1990 Unspecified osteoarthritis, unspecified site: Secondary | ICD-10-CM | POA: Diagnosis present

## 2022-03-24 DIAGNOSIS — R509 Fever, unspecified: Secondary | ICD-10-CM

## 2022-03-24 DIAGNOSIS — N390 Urinary tract infection, site not specified: Secondary | ICD-10-CM | POA: Diagnosis present

## 2022-03-24 DIAGNOSIS — E871 Hypo-osmolality and hyponatremia: Secondary | ICD-10-CM | POA: Diagnosis present

## 2022-03-24 DIAGNOSIS — D72829 Elevated white blood cell count, unspecified: Secondary | ICD-10-CM | POA: Diagnosis not present

## 2022-03-24 DIAGNOSIS — M109 Gout, unspecified: Secondary | ICD-10-CM | POA: Diagnosis present

## 2022-03-24 DIAGNOSIS — A4151 Sepsis due to Escherichia coli [E. coli]: Principal | ICD-10-CM | POA: Diagnosis present

## 2022-03-24 DIAGNOSIS — E876 Hypokalemia: Secondary | ICD-10-CM | POA: Diagnosis not present

## 2022-03-24 DIAGNOSIS — E860 Dehydration: Secondary | ICD-10-CM | POA: Diagnosis present

## 2022-03-24 DIAGNOSIS — F419 Anxiety disorder, unspecified: Secondary | ICD-10-CM | POA: Diagnosis present

## 2022-03-24 DIAGNOSIS — I10 Essential (primary) hypertension: Secondary | ICD-10-CM | POA: Diagnosis present

## 2022-03-24 LAB — COMPREHENSIVE METABOLIC PANEL
ALT: 44 U/L (ref 0–44)
AST: 29 U/L (ref 15–41)
Albumin: 3.5 g/dL (ref 3.5–5.0)
Alkaline Phosphatase: 75 U/L (ref 38–126)
Anion gap: 6 (ref 5–15)
BUN: 18 mg/dL (ref 8–23)
CO2: 26 mmol/L (ref 22–32)
Calcium: 8.9 mg/dL (ref 8.9–10.3)
Chloride: 99 mmol/L (ref 98–111)
Creatinine, Ser: 1.49 mg/dL — ABNORMAL HIGH (ref 0.61–1.24)
GFR, Estimated: 52 mL/min — ABNORMAL LOW (ref >60)
Glucose, Bld: 122 mg/dL — ABNORMAL HIGH (ref 70–99)
Potassium: 4 mmol/L (ref 3.5–5.1)
Sodium: 131 mmol/L — ABNORMAL LOW (ref 135–145)
Total Bilirubin: 1.2 mg/dL (ref 0.3–1.2)
Total Protein: 7.4 g/dL (ref 6.5–8.1)

## 2022-03-24 LAB — CBC WITH DIFFERENTIAL/PLATELET
Abs Immature Granulocytes: 0.13 10*3/uL — ABNORMAL HIGH (ref 0.00–0.07)
Basophils Absolute: 0.1 10*3/uL (ref 0.0–0.1)
Basophils Relative: 0 %
Eosinophils Absolute: 0 10*3/uL (ref 0.0–0.5)
Eosinophils Relative: 0 %
HCT: 38.4 % — ABNORMAL LOW (ref 39.0–52.0)
Hemoglobin: 13.1 g/dL (ref 13.0–17.0)
Immature Granulocytes: 1 %
Lymphocytes Relative: 6 %
Lymphs Abs: 0.9 10*3/uL (ref 0.7–4.0)
MCH: 29.2 pg (ref 26.0–34.0)
MCHC: 34.1 g/dL (ref 30.0–36.0)
MCV: 85.5 fL (ref 80.0–100.0)
Monocytes Absolute: 2 10*3/uL — ABNORMAL HIGH (ref 0.1–1.0)
Monocytes Relative: 13 %
Neutro Abs: 13 10*3/uL — ABNORMAL HIGH (ref 1.7–7.7)
Neutrophils Relative %: 80 %
Platelets: 235 10*3/uL (ref 150–400)
RBC: 4.49 MIL/uL (ref 4.22–5.81)
RDW: 16.3 % — ABNORMAL HIGH (ref 11.5–15.5)
WBC: 16.1 10*3/uL — ABNORMAL HIGH (ref 4.0–10.5)
nRBC: 0 % (ref 0.0–0.2)

## 2022-03-24 LAB — URINALYSIS, ROUTINE W REFLEX MICROSCOPIC
Bacteria, UA: NONE SEEN
Bilirubin Urine: NEGATIVE
Glucose, UA: NEGATIVE mg/dL
Ketones, ur: NEGATIVE mg/dL
Nitrite: NEGATIVE
Protein, ur: 100 mg/dL — AB
Specific Gravity, Urine: 1.013 (ref 1.005–1.030)
pH: 7 (ref 5.0–8.0)

## 2022-03-24 LAB — PROTIME-INR
INR: 1.4 — ABNORMAL HIGH (ref 0.8–1.2)
Prothrombin Time: 17 seconds — ABNORMAL HIGH (ref 11.4–15.2)

## 2022-03-24 LAB — LACTIC ACID, PLASMA: Lactic Acid, Venous: 1.7 mmol/L (ref 0.5–1.9)

## 2022-03-24 MED ORDER — ACETAMINOPHEN 325 MG PO TABS
650.0000 mg | ORAL_TABLET | Freq: Once | ORAL | Status: AC
Start: 2022-03-24 — End: 2022-03-24
  Administered 2022-03-24: 650 mg via ORAL
  Filled 2022-03-24: qty 2

## 2022-03-24 MED ORDER — LACTATED RINGERS IV BOLUS
1000.0000 mL | Freq: Once | INTRAVENOUS | Status: AC
Start: 2022-03-24 — End: 2022-03-24
  Administered 2022-03-24: 1000 mL via INTRAVENOUS

## 2022-03-24 MED ORDER — SODIUM CHLORIDE 0.9 % IV SOLN
2.0000 g | Freq: Once | INTRAVENOUS | Status: AC
  Administered 2022-03-24: 2 g via INTRAVENOUS
  Filled 2022-03-24: qty 20

## 2022-03-24 NOTE — H&P (Incomplete)
History and Physical    Patient: Joe [name] EPP:[id number] DOB: 11/01/1955 DOA: 03/24/2022 DOS: the patient was seen and examined on 03/24/2022 PCP: Celene Squibb, MD  Patient coming from: Home  Chief Complaint:  Chief Complaint  Patient presents with  . Dysuria  . Code Sepsis   HPI: Joe [name] is a 66 y.o. male with medical history significant of grade 3 follicular lymphoma of head and neck status post recent completion of radiation therapy and chemotherapy, hypertension     Review of Systems: {ROS_Text:26778} Past Medical History:  Diagnosis Date  . Anxiety   . Arthritis   . COVID    x 2 (2020 and 2022)  . Depression   . Follicular lymphoma ([place])   . Hypertension   . Left leg DVT ([place]) 07/2021  . Pulmonary embolism ([place]) 07/2021   Past Surgical History:  Procedure Laterality Date  . carpel tunnel    . IR IMAGING GUIDED PORT INSERTION  10/10/2021  . MASS BIOPSY Left 07/14/2021   Procedure: NECK MASS BIOPSY;  Surgeon: Melida Quitter, MD;  Location: [facility];  Service: ENT;  Laterality: Left;   Social History:  reports that he quit smoking about 35 years ago. His smoking use included cigarettes. He smoked an average of .25 packs per day. He quit smokeless tobacco use about 34 years ago.  His smokeless tobacco use included chew. He reports current alcohol use. He reports that he does not use drugs.  No Known Allergies  Family History  Problem Relation Age of Onset  . Healthy Mother   . Healthy Father   . Throat cancer Brother     Prior to Admission medications   Medication Sig Start Date End Date Taking? Authorizing Provider  acetaminophen (TYLENOL) 325 MG tablet Take 2 tablets (650 mg total) by mouth every 6 (six) hours as needed for mild pain (or Fever >/= 101). 08/25/21  Yes Sheikh, Omair Latif, DO  albuterol (VENTOLIN HFA) 108 (90 Base) MCG/ACT inhaler Inhale 2 puffs into the lungs every 6 (six) hours as needed for wheezing or shortness of breath.   Yes  [provider]  allopurinol (ZYLOPRIM) 300 MG tablet TAKE 1 TABLET BY MOUTH EVERY DAY 12/17/21  Yes Mordecai Rasmussen, MD  ALPRAZolam Duanne Moron) 1 MG tablet Take 1 mg by mouth 3 (three) times daily as needed. 08/30/21  Yes [provider]  amLODipine (NORVASC) 10 MG tablet Take 10 mg by mouth daily. 04/26/20  Yes [provider]  ELIQUIS 5 MG TABS tablet TAKE 1 TABLET BY MOUTH TWICE DAILY, this replaces THE lovenox injections Patient taking differently: Take 5 mg by mouth 2 (two) times daily. 03/12/22  Yes Brunetta Genera, MD  feeding supplement (ENSURE ENLIVE / ENSURE PLUS) LIQD Take 237 mLs by mouth 2 (two) times daily between meals. 08/25/21  Yes Sheikh, Omair Latif, DO  lidocaine (XYLOCAINE) 2 % solution Patient: Mix 1part 2% viscous lidocaine, 1part H20. Swallow 30m of diluted mixture, 352m before meals and at bedtime, up to QID Patient not taking: Reported on 03/24/2022 02/04/22   SqEppie GibsonMD  lidocaine-prilocaine (EMLA) cream Apply 1 application topically as needed. 30-60 mins prior to port a cath access. Start using 2 weeks after port a cath placement. 10/18/21  Yes KaBrunetta GeneraMD  Multiple Vitamin (ONE-A-DAY MENS PO) Take by mouth.   Yes [provider]  ondansetron (ZOFRAN) 8 MG tablet Take 1 tablet (8 mg total) by mouth 2 (two) times daily  as needed for refractory nausea / vomiting. Start on day 3 after cyclophosphamide chemotherapy. 08/14/21  Yes Brunetta Genera, MD  sertraline (ZOLOFT) 50 MG tablet Take 50 mg by mouth daily.   Yes [provider]  sodium chloride flush (NS) 0.9 % SOLN 10-40 mLs by Intracatheter route as needed (flush). 08/25/21  Yes Sheikh, Omair Latif, DO  Vitamin D, Ergocalciferol, (DRISDOL) 1.25 MG (50000 UNIT) CAPS capsule TAKE ONE CAPSULE BY MOUTH ONCE a WEEK 01/16/22  Yes Brunetta Genera, MD  HYDROcodone-acetaminophen (HYCET) 7.5-325 mg/15 ml solution Take 10 mLs by mouth 4 (four) times daily as needed  for moderate pain. Patient not taking: Reported on 03/24/2022 02/07/22   Gery Pray, MD  predniSONE (DELTASONE) 20 MG tablet Take 3 tablets (60 mg total) by mouth daily. Take with food on days 2-6 of chemotherapy. Patient not taking: Reported on 03/24/2022 08/14/21   Brunetta Genera, MD  prochlorperazine (COMPAZINE) 10 MG tablet Take 1 tablet (10 mg total) by mouth every 6 (six) hours as needed (Nausea or vomiting). Patient not taking: Reported on 03/24/2022 08/14/21   Brunetta Genera, MD  Sodium Chloride-Sodium Bicarb (SODIUM BICARBONATE/SODIUM CHLORIDE) SOLN 1 application by Mouth Rinse route as needed for dry mouth. Patient not taking: Reported on 03/24/2022 08/25/21   Raiford Noble Duncan, DO    Physical Exam: Vitals:   03/24/22 2000 03/24/22 2030 03/24/22 2100 03/24/22 2130  BP: 116/79 126/73 122/74 126/72  Pulse: 95 100 99 100  Resp: (!) 29 (!) 24 (!) 28 (!) 31  Temp:      TempSrc:      SpO2: 95% 93% 93% 91%  Weight:      Height:       *** Data Reviewed: {Tip this will not be part of the note when signed- Document your independent interpretation of telemetry tracing, EKG, lab, Radiology test or any other diagnostic tests. Add any new diagnostic test ordered today. (Optional):26781} {Results:26384}  Assessment and Plan: No notes have been filed under this hospital service. Service: Hospitalist     Advance Care Planning:   Code Status: Prior ***  Consults: ***  Family Communication: ***  Severity of Illness: {Observation/Inpatient:21159}  Author: Bernadette Hoit, DO 03/24/2022 10:08 PM  For on call review [email or web address].

## 2022-03-24 NOTE — ED Triage Notes (Signed)
Pt presents with urinary burning and frequency for 2 weeks. Pt also states he is on chemo for throat CA. Code sepsis initiated and charge RN made aware.

## 2022-03-24 NOTE — H&P (Signed)
History and Physical    Patient: Joe [name] DOB: July 06, 1956 DOA: 03/24/2022 DOS: the patient was seen and examined on 03/25/2022 PCP: Celene Squibb, MD  Patient coming from: Home  Chief Complaint:  Chief Complaint  Patient presents with   Dysuria   Code Sepsis   HPI: Joe [name] is a 66 y.o. 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  He denies chest pain, shortness of breath, nausea, vomiting, headache, sore throat, cough.  ED Course:  In the emergency department, he was febrile with a temperature of 102.38F, tachypneic and tachycardic.  BP was soft at 104/74.  Work-up in the ED showed normal CBC except for leukocytosis, BMP showed hyponatremia, hyperglycemia, BUN/creatinine 18/1.49 (baseline creatinine at 0.9-1.1).  Blood culture pending. Chest x-ray showed no active disease She was started on IV ceftriaxone, Tylenol was given and patient was provided with IV hydration.  Hospitalist was asked to admit patient for further evaluation and management.  Review of Systems: Review of systems as noted in the HPI. All other systems reviewed and are negative.   Past Medical History:  Diagnosis Date   Anxiety    Arthritis    COVID    x 2 (2020 and 2022)   Depression    Follicular lymphoma ([place])    Hypertension    Left leg DVT ([place]) 07/2021   Pulmonary embolism ([place]) 07/2021   Past Surgical History:  Procedure Laterality Date   carpel tunnel     IR IMAGING GUIDED PORT INSERTION  10/10/2021   MASS BIOPSY Left 07/14/2021   Procedure: NECK MASS BIOPSY;  Surgeon: Melida Quitter, MD;  Location: [facility];  Service: ENT;  Laterality: Left;    Social History:   reports that he quit smoking about 35 years ago. His smoking use included cigarettes. He smoked an average of .25 packs per day. He quit smokeless tobacco use about 34 years ago.  His smokeless tobacco use included chew. He reports current alcohol use. He reports that he does not use drugs.   No Known Allergies  Family History  Problem Relation Age of Onset   Healthy Mother    Healthy Father    Throat cancer Brother      Prior to Admission medications   Medication Sig Start Date End Date Taking? Authorizing Provider  acetaminophen (TYLENOL) 325 MG tablet Take 2 tablets (650 mg total) by mouth every 6 (six) hours as needed for mild pain (or Fever >/= 101). 08/25/21  Yes Sheikh, Omair Latif, DO  albuterol (VENTOLIN HFA) 108 (90 Base) MCG/ACT inhaler Inhale 2 puffs into the lungs every 6 (six) hours as needed for wheezing or shortness of breath.   Yes [provider]  allopurinol (ZYLOPRIM) 300 MG tablet TAKE 1 TABLET BY MOUTH EVERY DAY 12/17/21  Yes Mordecai Rasmussen, MD  ALPRAZolam Duanne Moron) 1 MG tablet Take 1 mg by mouth 3 (three) times daily as needed. 08/30/21  Yes [provider]  amLODipine (NORVASC) 10 MG tablet Take 10 mg by mouth daily. 04/26/20  Yes [provider]  ELIQUIS 5 MG TABS tablet TAKE 1 TABLET BY MOUTH TWICE DAILY, this replaces THE lovenox injections Patient taking differently: Take 5 mg by mouth 2 (  two) times daily. 03/12/22  Yes Brunetta Genera, MD  feeding supplement (ENSURE ENLIVE / ENSURE PLUS) LIQD Take 237 mLs by mouth 2 (two) times daily between meals. 08/25/21  Yes Sheikh, Omair Latif, DO  lidocaine (XYLOCAINE) 2 % solution Patient: Mix 1part 2% viscous lidocaine, 1part H20. Swallow 61m of diluted mixture, 359m before meals and at bedtime, up to QID Patient not taking: Reported on 03/24/2022 02/04/22   SqEppie GibsonMD  lidocaine-prilocaine (EMLA) cream Apply 1 application topically as needed. 30-60 mins prior to port a cath access. Start  using 2 weeks after port a cath placement. 10/18/21  Yes KaBrunetta GeneraMD  Multiple Vitamin (ONE-A-DAY MENS PO) Take by mouth.   Yes [provider]  ondansetron (ZOFRAN) 8 MG tablet Take 1 tablet (8 mg total) by mouth 2 (two) times daily as needed for refractory nausea / vomiting. Start on day 3 after cyclophosphamide chemotherapy. 08/14/21  Yes KaBrunetta GeneraMD  sertraline (ZOLOFT) 50 MG tablet Take 50 mg by mouth daily.   Yes [provider]  sodium chloride flush (NS) 0.9 % SOLN 10-40 mLs by Intracatheter route as needed (flush). 08/25/21  Yes Sheikh, Omair Latif, DO  Vitamin D, Ergocalciferol, (DRISDOL) 1.25 MG (50000 UNIT) CAPS capsule TAKE ONE CAPSULE BY MOUTH ONCE a WEEK 01/16/22  Yes KaBrunetta GeneraMD  HYDROcodone-acetaminophen (HYCET) 7.5-325 mg/15 ml solution Take 10 mLs by mouth 4 (four) times daily as needed for moderate pain. Patient not taking: Reported on 03/24/2022 02/07/22   KiGery PrayMD  predniSONE (DELTASONE) 20 MG tablet Take 3 tablets (60 mg total) by mouth daily. Take with food on days 2-6 of chemotherapy. Patient not taking: Reported on 03/24/2022 08/14/21   KaBrunetta GeneraMD  prochlorperazine (COMPAZINE) 10 MG tablet Take 1 tablet (10 mg total) by mouth every 6 (six) hours as needed (Nausea or vomiting). Patient not taking: Reported on 03/24/2022 08/14/21   KaBrunetta GeneraMD  Sodium Chloride-Sodium Bicarb (SODIUM BICARBONATE/SODIUM CHLORIDE) SOLN 1 application by Mouth Rinse route as needed for dry mouth. Patient not taking: Reported on 03/24/2022 08/25/21   ShKerney ElbeDO    Physical Exam: BP 99/64 (BP Location: Left Arm)   Pulse (!) 108   Temp 99.4 F (37.4 C) (Oral)   Resp (!) 22   Ht _0  (1.753 m)   Wt 128 kg   SpO2 97%   BMI 41.67 kg/m   General: [age].o. year-old male well developed well nourished in no acute distress.  Alert and oriented x3. HEENT: NCAT, EOMI Neck: Supple, trachea  medial Cardiovascular: Tachycardia, regular rate and rhythm with no rubs or gallops.  No thyromegaly or JVD noted.  No lower extremity edema. 2/4 pulses in all 4 extremities. Respiratory: Tachypnea, clear to auscultation with no wheezes or rales. Good inspiratory effort. Abdomen: Soft, nontender nondistended with normal bowel sounds x4 quadrants. Muskuloskeletal: No cyanosis, clubbing or edema noted bilaterally Neuro: CN II-XII intact, strength 5/5 x 4, sensation, reflexes intact Skin: No ulcerative lesions noted or rashes Psychiatry: Judgement and insight appear normal. Mood is appropriate for condition and setting          Labs on Admission:  Basic Metabolic Panel: Recent Labs  Lab 03/24/22 1823  NA 131*  K 4.0  CL 99  CO2 26  GLUCOSE 122*  BUN 18  CREATININE 1.49*  CALCIUM 8.9   Liver Function Tests: Recent Labs  Lab 03/24/22 1823  AST 29  ALT  44  ALKPHOS 75  BILITOT 1.2  PROT 7.4  ALBUMIN 3.5   No results for input(s): LIPASE, AMYLASE in the last 168 hours. No results for input(s): AMMONIA in the last 168 hours. CBC: Recent Labs  Lab 03/24/22 1823  WBC 16.1*  NEUTROABS 13.0*  HGB 13.1  HCT 38.4*  MCV 85.5  PLT 235   Cardiac Enzymes: No results for input(s): CKTOTAL, CKMB, CKMBINDEX, TROPONINI in the last 168 hours.  BNP (last 3 results) Recent Labs    08/15/21 1354  BNP 25.7    ProBNP (last 3 results) No results for input(s): PROBNP in the last 8760 hours.  CBG: No results for input(s): GLUCAP in the last 168 hours.  Radiological Exams on Admission: DG Chest Port 1 View  Result Date: 03/24/2022 CLINICAL DATA:  Possible sepsis. History of throat cancer, currently on chemotherapy. EXAM: PORTABLE CHEST 1 VIEW COMPARISON:  10/13/2021 prior studies FINDINGS: The cardiomediastinal silhouette is unremarkable. RIGHT IJ Port-A-Cath again noted with tip overlying the mid SVC. There is no evidence of focal airspace disease, pulmonary edema, suspicious  pulmonary nodule/mass, pleural effusion, or pneumothorax. No acute bony abnormalities are identified. IMPRESSION: No active disease. Electronically Signed   By: Margarette Canada M.D.   On: 03/24/2022 18:24    EKG: I independently viewed the EKG done and my findings are as followed: Sinus tachycardia at rate of 111 bpm with LAFB  Assessment/Plan Present on Admission:  Sepsis secondary to UTI [facility])  Essential hypertension  Obesity, Class III, BMI 40-49.9 (morbid obesity) ([place])  Principal Problem:   Sepsis secondary to UTI [facility]) Active Problems:   Essential hypertension   Obesity, Class III, BMI 40-49.9 (morbid obesity) ([place])   Leukocytosis   Hyponatremia   AKI (acute kidney injury) ([place])   Left leg DVT ([place])  Sepsis secondary to UTI Patient met sepsis criteria on admission due to being tachycardic, tachypneic, febrile and having leukocytosis.  Source of infection was UTI Patient was started on IV ceftriaxone, we shall continue with same at this time Continue Tylenol as needed Urine culture pending  Leukocytosis possibly secondary to above WBC 16.1, continue management as described above continue to monitor WBC with morning labs  Hyponatremia Na 129, IV hydration was provided Continue to monitor sodium level with morning labs  Acute kidney injury BUN/creatinine 18/1.49 (baseline creatinine at 0.9-1.1) Continue gentle hydration Renally adjust medications, avoid nephrotoxic agents/dehydration/hypotension  Essential hypertension Continue amlodipine  Obesity class III (BNP 41.67) Diet and lifestyle modification  Left leg DVT Continue Eliquis  DVT prophylaxis: Eliquis   Advance Care Planning:   Code Status: Full Code   Consults: None  Family Communication: None at bedside  Severity of Illness: The appropriate patient status for this patient is INPATIENT. Inpatient status is judged to be reasonable and necessary in order to provide the required intensity of service to ensure  the patient's safety. The patient's presenting symptoms, physical exam findings, and initial radiographic and laboratory data in the context of their chronic comorbidities is felt to place them at high risk for further clinical deterioration. Furthermore, it is not anticipated that the patient will be medically stable for discharge from the hospital within 2 midnights of admission.   * I certify that at the point of admission it is my clinical judgment that the patient will require inpatient hospital care spanning beyond 2 midnights from the point of admission due to high intensity of service, high risk for further deterioration and high frequency of surveillance required.*  Author: Bernadette Hoit, DO 03/25/2022 1:00 AM  For on call review [email or web address].

## 2022-03-24 NOTE — ED Provider Notes (Signed)
[facility] Provider Note   CSN: [id number] Arrival date & time: 03/24/22  1717     History  Chief Complaint  Patient presents with   Dysuria   Code Sepsis    Joe [name] is a 66 y.o. male.  Patient with hx follicular lymphoma of head/neck, s/p recent completion radiation txs, c/o recent fever, dysuria, decreased appetite, and general weakness in the past week. Symptoms acute onset, moderate, persistent. Denies hx frequent utis. No testicular/scrotal pain or swelling. Denies abd pain or distension. No vomiting. Having normal bms. No headache. No sore throat. No cough or uri symptoms. No chest pain or sob. No extremity pain or swelling.   The history is provided by the patient, the spouse and medical records.  Dysuria Presenting symptoms: dysuria   Associated symptoms: fever   Associated symptoms: no abdominal pain, no diarrhea, no flank pain and no vomiting       Home Medications Prior to Admission medications   Medication Sig Start Date End Date Taking? Authorizing Provider  lidocaine (XYLOCAINE) 2 % solution Patient: Mix 1part 2% viscous lidocaine, 1part H20. Swallow 58m of diluted mixture, 3104m before meals and at bedtime, up to QID 02/04/22   SqEppie GibsonMD  acetaminophen (TYLENOL) 325 MG tablet Take 2 tablets (650 mg total) by mouth every 6 (six) hours as needed for mild pain (or Fever >/= 101). 08/25/21   ShRaiford Nobleatif, DO  allopurinol (ZYLOPRIM) 300 MG tablet TAKE 1 TABLET BY MOUTH EVERY DAY 12/17/21   CaMordecai RasmussenMD  ALPRAZolam (XDuanne Moron1 MG tablet Take 1 mg by mouth 3 (three) times daily as needed. 08/30/21   [provider]  amLODipine (NORVASC) 10 MG tablet Take 10 mg by mouth daily. 04/26/20   [provider]  ELIQUIS 5 MG TABS tablet TAKE 1 TABLET BY MOUTH TWICE DAILY, this replaces THE lovenox injections 03/12/22   KaBrunetta GeneraMD  feeding supplement (ENSURE ENLIVE / ENSURE PLUS) LIQD Take 237 mLs by mouth 2  (two) times daily between meals. 08/25/21   ShRaiford Nobleatif, DO  HYDROcodone-acetaminophen (HYCET) 7.5-325 mg/15 ml solution Take 10 mLs by mouth 4 (four) times daily as needed for moderate pain. 02/07/22   KiGery PrayMD  lidocaine-prilocaine (EMLA) cream Apply 1 application topically as needed. 30-60 mins prior to port a cath access. Start using 2 weeks after port a cath placement. 10/18/21   KaBrunetta GeneraMD  ondansetron (ZOFRAN) 8 MG tablet Take 1 tablet (8 mg total) by mouth 2 (two) times daily as needed for refractory nausea / vomiting. Start on day 3 after cyclophosphamide chemotherapy. 08/14/21   KaBrunetta GeneraMD  predniSONE (DELTASONE) 20 MG tablet Take 3 tablets (60 mg total) by mouth daily. Take with food on days 2-6 of chemotherapy. Patient taking differently: Take 60 mg by mouth See admin instructions. Take with food on days 2-6 of chemotherapy. 08/14/21   KaBrunetta GeneraMD  prochlorperazine (COMPAZINE) 10 MG tablet Take 1 tablet (10 mg total) by mouth every 6 (six) hours as needed (Nausea or vomiting). 08/14/21   KaBrunetta GeneraMD  sertraline (ZOLOFT) 50 MG tablet Take 50 mg by mouth daily.    [provider]  sodium chloride flush (NS) 0.9 % SOLN 10-40 mLs by Intracatheter route as needed (flush). 08/25/21   ShRaiford Nobleatif, DO  Sodium Chloride-Sodium Bicarb (SODIUM BICARBONATE/SODIUM CHLORIDE) SOLN 1 application by Mouth Rinse route as needed for dry mouth.  08/25/21   Raiford Noble Latif, DO  Vitamin D, Ergocalciferol, (DRISDOL) 1.25 MG (50000 UNIT) CAPS capsule TAKE ONE CAPSULE BY MOUTH ONCE a WEEK 01/16/22   Brunetta Genera, MD      Allergies    Patient has no known allergies.    Review of Systems   Review of Systems  Constitutional:  Positive for appetite change and fever.  HENT:  Negative for sore throat.   Eyes:  Negative for redness.  Respiratory:  Negative for cough and shortness of breath.   Cardiovascular:  Negative for  chest pain and leg swelling.  Gastrointestinal:  Negative for abdominal pain, diarrhea and vomiting.  Genitourinary:  Positive for dysuria. Negative for flank pain.  Musculoskeletal:  Negative for back pain and neck pain.  Skin:  Negative for rash.  Neurological:  Positive for weakness. Negative for syncope, speech difficulty, numbness and headaches.  Hematological:  Does not bruise/bleed easily.  Psychiatric/Behavioral:  Negative for confusion.    Physical Exam Updated Vital Signs BP 104/74 (BP Location: Left Arm)   Pulse (!) 129   Temp 100.1 F (37.8 C) (Oral)   Resp (!) 24   Ht 1.753 m ('5\' 9"'$ )   Wt 127.9 kg   SpO2 95%   BMI 41.64 kg/m  Physical Exam Vitals and nursing note reviewed.  Constitutional:      Appearance: Normal appearance. He is well-developed.  HENT:     Head: Atraumatic.     Nose: Nose normal.     Mouth/Throat:     Mouth: Mucous membranes are moist.     Pharynx: Oropharynx is clear.  Eyes:     General: No scleral icterus.    Conjunctiva/sclera: Conjunctivae normal.     Pupils: Pupils are equal, round, and reactive to light.  Neck:     Trachea: No tracheal deviation.     Comments: No stiffness or rigidity.  Cardiovascular:     Rate and Rhythm: Regular rhythm. Tachycardia present.     Pulses: Normal pulses.     Heart sounds: Normal heart sounds. No murmur heard.   No friction rub. No gallop.  Pulmonary:     Effort: Pulmonary effort is normal. No accessory muscle usage or respiratory distress.     Breath sounds: Normal breath sounds.  Abdominal:     General: Bowel sounds are normal. There is no distension.     Palpations: Abdomen is soft.     Tenderness: There is no abdominal tenderness. There is no guarding.     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  Musculoskeletal:        General: No swelling or tenderness.     Cervical  back: Normal range of motion and neck supple. No rigidity.     Right lower leg: No edema.     Left lower leg: No edema.  Skin:    General: Skin is warm and dry.     Findings: No rash.  Neurological:     Mental Status: He is alert.     Comments: Alert, speech clear. Motor/sens grossly intact bil.   Psychiatric:        Mood and Affect: Mood normal.    ED Results / Procedures / Treatments   Labs (all labs ordered are listed, but only abnormal results are displayed) Results for orders placed or performed during the hospital encounter of 03/24/22  Culture, blood (Routine x 2)  Specimen: Right Antecubital; Blood  Result Value Ref Range   Specimen Description RIGHT ANTECUBITAL    Special Requests      BOTTLES DRAWN AEROBIC AND ANAEROBIC Blood Culture results may not be optimal due to an excessive volume of blood received in culture bottles Performed at [facility], [address] [place], [place] [address]    Culture PENDING    Report Status PENDING   Comprehensive metabolic panel  Result Value Ref Range   Sodium 131 (L) 135 - 145 mmol/L   Potassium 4.0 3.5 - 5.1 mmol/L   Chloride 99 98 - 111 mmol/L   CO2 26 22 - 32 mmol/L   Glucose, Bld 122 (H) 70 - 99 mg/dL   BUN 18 8 - 23 mg/dL   Creatinine, Ser 1.49 (H) 0.61 - 1.24 mg/dL   Calcium 8.9 8.9 - 10.3 mg/dL   Total Protein 7.4 6.5 - 8.1 g/dL   Albumin 3.5 3.5 - 5.0 g/dL   AST 29 15 - 41 U/L   ALT 44 0 - 44 U/L   Alkaline Phosphatase 75 38 - 126 U/L   Total Bilirubin 1.2 0.3 - 1.2 mg/dL   GFR, Estimated 52 (L) >60 mL/min   Anion gap 6 5 - 15  Lactic acid, plasma  Result Value Ref Range   Lactic Acid, Venous 1.7 0.5 - 1.9 mmol/L  CBC with Differential  Result Value Ref Range   WBC 16.1 (H) 4.0 - 10.5 K/uL   RBC 4.49 4.22 - 5.81 MIL/uL   Hemoglobin 13.1 13.0 - 17.0 g/dL   HCT 38.4 (L) 39.0 - 52.0 %   MCV 85.5 80.0 - 100.0 fL   MCH 29.2 26.0 - 34.0 pg   MCHC 34.1 30.0 - 36.0 g/dL   RDW 16.3 (H) 11.5 - 15.5 %   Platelets  235 150 - 400 K/uL   nRBC 0.0 0.0 - 0.2 %   Neutrophils Relative % 80 %   Neutro Abs 13.0 (H) 1.7 - 7.7 K/uL   Lymphocytes Relative 6 %   Lymphs Abs 0.9 0.7 - 4.0 K/uL   Monocytes Relative 13 %   Monocytes Absolute 2.0 (H) 0.1 - 1.0 K/uL   Eosinophils Relative 0 %   Eosinophils Absolute 0.0 0.0 - 0.5 K/uL   Basophils Relative 0 %   Basophils Absolute 0.1 0.0 - 0.1 K/uL   Immature Granulocytes 1 %   Abs Immature Granulocytes 0.13 (H) 0.00 - 0.07 K/uL  Protime-INR  Result Value Ref Range   Prothrombin Time 17.0 (H) 11.4 - 15.2 seconds   INR 1.4 (H) 0.8 - 1.2  Urinalysis, Routine w reflex microscopic Urine, Clean Catch  Result Value Ref Range   Color, Urine AMBER (A) YELLOW   APPearance HAZY (A) CLEAR   Specific Gravity, Urine 1.013 1.005 - 1.030   pH 7.0 5.0 - 8.0   Glucose, UA NEGATIVE NEGATIVE mg/dL   Hgb urine dipstick SMALL (A) NEGATIVE   Bilirubin Urine NEGATIVE NEGATIVE   Ketones, ur NEGATIVE NEGATIVE mg/dL   Protein, ur 100 (A) NEGATIVE mg/dL   Nitrite NEGATIVE NEGATIVE   Leukocytes,Ua TRACE (A) NEGATIVE   RBC / HPF 21-50 0 - 5 RBC/hpf   WBC, UA 21-50 0 - 5 WBC/hpf   Bacteria, UA NONE SEEN NONE SEEN   Mucus PRESENT    SLEEP STUDY DOCUMENTS  Result Date: 03/07/2022 Ordered by an unspecified provider.     EKG None  Radiology DG Chest [facility] 1 View  Result Date:  03/24/2022 CLINICAL DATA:  Possible sepsis. History of throat cancer, currently on chemotherapy. EXAM: PORTABLE CHEST 1 VIEW COMPARISON:  10/13/2021 prior studies FINDINGS: The cardiomediastinal silhouette is unremarkable. RIGHT IJ Port-A-Cath again noted with tip overlying the mid SVC. There is no evidence of focal airspace disease, pulmonary edema, suspicious pulmonary nodule/mass, pleural effusion, or pneumothorax. No acute bony abnormalities are identified. IMPRESSION: No active disease. Electronically Signed   By: Margarette Canada M.D.   On: 03/24/2022 18:24    Procedures Procedures    Medications Ordered  in ED Medications - No data to display  ED Course/ Medical Decision Making/ A&P                           Medical Decision Making Problems Addressed: Acute febrile illness: acute illness or injury with systemic symptoms that poses a threat to life or bodily functions Acute UTI: acute illness or injury with systemic symptoms that poses a threat to life or bodily functions AKI (acute kidney injury) ([place]): acute illness or injury that poses a threat to life or bodily functions Generalized weakness: acute illness or injury with systemic symptoms that poses a threat to life or bodily functions Hyponatremia: acute illness or injury  Amount and/or Complexity of Data Reviewed Independent Historian:     Details: family, hx External Data Reviewed: notes. Labs: ordered. Decision-making details documented in ED Course. Radiology: ordered and independent interpretation performed. Decision-making details documented in ED Course. ECG/medicine tests: ordered and independent interpretation performed. Decision-making details documented in ED Course. Discussion of management or test interpretation with external provider(s): Hospitalist - discussed pt.   Risk Prescription drug management. Decision regarding hospitalization.   Iv ns. Continuous pulse ox and cardiac monitoring. Labs ordered/sent. Imaging ordered.   Reviewed nursing notes and prior charts for additional history. External reports reviewed. Additional history from: family.   Cardiac monitor: sinus rhythm, rate 120.  Labs reviewed/interpreted by me - ua c/w uti. Cxs sent. Rocephin iv. Iv fluids.   Xrays reviewed/interpreted by me - no pna.   LR bolus iv.   Hospitalists consulted for admission.            Final Clinical Impression(s) / ED Diagnoses Final diagnoses:  None    Rx / DC Orders ED Discharge Orders     None         Lajean Saver, MD 03/24/22 2108

## 2022-03-25 ENCOUNTER — Encounter (HOSPITAL_COMMUNITY): Payer: Self-pay | Admitting: Internal Medicine

## 2022-03-25 DIAGNOSIS — I82402 Acute embolism and thrombosis of unspecified deep veins of left lower extremity: Secondary | ICD-10-CM

## 2022-03-25 DIAGNOSIS — N390 Urinary tract infection, site not specified: Secondary | ICD-10-CM | POA: Diagnosis not present

## 2022-03-25 DIAGNOSIS — D72829 Elevated white blood cell count, unspecified: Secondary | ICD-10-CM

## 2022-03-25 DIAGNOSIS — E871 Hypo-osmolality and hyponatremia: Secondary | ICD-10-CM

## 2022-03-25 DIAGNOSIS — N179 Acute kidney failure, unspecified: Secondary | ICD-10-CM

## 2022-03-25 DIAGNOSIS — A419 Sepsis, unspecified organism: Secondary | ICD-10-CM | POA: Diagnosis not present

## 2022-03-25 LAB — BLOOD CULTURE ID PANEL (REFLEXED) - BCID2

## 2022-03-25 LAB — COMPREHENSIVE METABOLIC PANEL
ALT: 36 U/L (ref 0–44)
AST: 24 U/L (ref 15–41)
Albumin: 3.3 g/dL — ABNORMAL LOW (ref 3.5–5.0)
Alkaline Phosphatase: 72 U/L (ref 38–126)
Anion gap: 9 (ref 5–15)
BUN: 18 mg/dL (ref 8–23)
CO2: 27 mmol/L (ref 22–32)
Calcium: 8.9 mg/dL (ref 8.9–10.3)
Chloride: 99 mmol/L (ref 98–111)
Creatinine, Ser: 1.52 mg/dL — ABNORMAL HIGH (ref 0.61–1.24)
GFR, Estimated: 51 mL/min — ABNORMAL LOW (ref >60)
Glucose, Bld: 111 mg/dL — ABNORMAL HIGH (ref 70–99)
Potassium: 4.5 mmol/L (ref 3.5–5.1)
Sodium: 135 mmol/L (ref 135–145)
Total Bilirubin: 1.2 mg/dL (ref 0.3–1.2)
Total Protein: 6.8 g/dL (ref 6.5–8.1)

## 2022-03-25 LAB — MAGNESIUM: Magnesium: 1.8 mg/dL (ref 1.7–2.4)

## 2022-03-25 LAB — CBC
HCT: 37.6 % — ABNORMAL LOW (ref 39.0–52.0)
Hemoglobin: 12.4 g/dL — ABNORMAL LOW (ref 13.0–17.0)
MCH: 29 pg (ref 26.0–34.0)
MCHC: 33 g/dL (ref 30.0–36.0)
MCV: 88.1 fL (ref 80.0–100.0)
Platelets: 212 10*3/uL (ref 150–400)
RBC: 4.27 MIL/uL (ref 4.22–5.81)
RDW: 16.5 % — ABNORMAL HIGH (ref 11.5–15.5)
WBC: 14.8 10*3/uL — ABNORMAL HIGH (ref 4.0–10.5)
nRBC: 0 % (ref 0.0–0.2)

## 2022-03-25 LAB — PHOSPHORUS: Phosphorus: 3.5 mg/dL (ref 2.5–4.6)

## 2022-03-25 MED ORDER — AMLODIPINE BESYLATE 5 MG PO TABS
10.0000 mg | ORAL_TABLET | Freq: Every day | ORAL | Status: DC
Start: 2022-03-25 — End: 2022-03-25
  Filled 2022-03-25: qty 2

## 2022-03-25 MED ORDER — CEFTRIAXONE SODIUM 1 G IJ SOLR
1.0000 g | INTRAMUSCULAR | Status: DC
Start: 2022-03-25 — End: 2022-03-25

## 2022-03-25 MED ORDER — ONDANSETRON HCL 4 MG/2ML IJ SOLN: 4.0000 mg | Freq: Four times a day (QID) | INTRAMUSCULAR | Status: DC | PRN

## 2022-03-25 MED ORDER — KETOROLAC TROMETHAMINE 15 MG/ML IJ SOLN
15.0000 mg | Freq: Once | INTRAMUSCULAR | Status: AC
  Administered 2022-03-25: 15 mg via INTRAVENOUS
  Filled 2022-03-25: qty 1

## 2022-03-25 MED ORDER — SODIUM CHLORIDE 0.9 % IV SOLN: 1.0000 g | INTRAVENOUS | Status: DC

## 2022-03-25 MED ORDER — ADULT MULTIVITAMIN W/MINERALS CH
1.0000 | ORAL_TABLET | Freq: Every day | ORAL | Status: DC
  Administered 2022-03-25 – 2022-03-28 (×4): 1 via ORAL
  Filled 2022-03-25 (×4): qty 1

## 2022-03-25 MED ORDER — ENSURE ENLIVE PO LIQD
237.0000 mL | Freq: Two times a day (BID) | ORAL | Status: DC
  Administered 2022-03-25 – 2022-03-28 (×6): 237 mL via ORAL

## 2022-03-25 MED ORDER — ONDANSETRON HCL 4 MG PO TABS: 4.0000 mg | ORAL_TABLET | Freq: Four times a day (QID) | ORAL | Status: DC | PRN

## 2022-03-25 MED ORDER — APIXABAN 5 MG PO TABS
5.0000 mg | ORAL_TABLET | Freq: Two times a day (BID) | ORAL | Status: DC
  Administered 2022-03-25 – 2022-03-28 (×7): 5 mg via ORAL
  Filled 2022-03-25 (×7): qty 1

## 2022-03-25 MED ORDER — ACETAMINOPHEN 325 MG PO TABS
650.0000 mg | ORAL_TABLET | Freq: Four times a day (QID) | ORAL | Status: DC | PRN
  Administered 2022-03-25 (×2): 650 mg via ORAL
  Filled 2022-03-25 (×2): qty 2

## 2022-03-25 MED ORDER — SODIUM CHLORIDE 0.9 % IV SOLN: INTRAVENOUS | Status: AC

## 2022-03-25 MED ORDER — ACETAMINOPHEN 650 MG RE SUPP: 650.0000 mg | Freq: Four times a day (QID) | RECTAL | Status: DC | PRN

## 2022-03-25 MED ORDER — SODIUM CHLORIDE 0.9 % IV SOLN
2.0000 g | INTRAVENOUS | Status: DC
  Administered 2022-03-25 – 2022-03-27 (×3): 2 g via INTRAVENOUS
  Filled 2022-03-25 (×3): qty 20

## 2022-03-25 NOTE — Progress Notes (Signed)
   03/25/22 0513  Assess: MEWS Score  Temp (!) 101.5 F (38.6 C)  BP 95/68  MAP (mmHg) 74  Pulse Rate (!) 107  Resp 18  SpO2 95 %  O2 Device Room Air  Assess: MEWS Score  MEWS Temp 2  MEWS Systolic 1  MEWS Pulse 1  MEWS RR 0  MEWS LOC 0  MEWS Score 4  MEWS Score Color Red  Assess: SIRS CRITERIA  SIRS Temperature  1  SIRS Pulse 1  SIRS Respirations  0  SIRS WBC 0  SIRS Score Sum  2

## 2022-03-25 NOTE — Progress Notes (Signed)
   03/25/22 0041  Assess: MEWS Score  Temp 99.4 F (37.4 C)  BP 99/64  MAP (mmHg) 75  Pulse Rate (!) 108  Resp (!) 22  SpO2 97 %  O2 Device Room Air  Assess: MEWS Score  MEWS Temp 0  MEWS Systolic 1  MEWS Pulse 1  MEWS RR 1  MEWS LOC 0  MEWS Score 3  MEWS Score Color Yellow  Assess: if the MEWS score is Yellow or Red  Were vital signs taken at a resting state? Yes  Focused Assessment No change from prior assessment  Does the patient meet 2 or more of the SIRS criteria? Yes  MEWS guidelines implemented *See Row Information* Yes  Treat  MEWS Interventions Other (Comment) (patient tachy had elevated temp. Tylenol given)  Take Vital Signs  Increase Vital Sign Frequency  Yellow: Q 2hr X 2 then Q 4hr X 2, if remains yellow, continue Q 4hrs  Escalate  MEWS: Escalate Yellow: discuss with charge nurse/RN and consider discussing with provider and RRT  Notify: Charge Nurse/RN  Name of Charge Nurse/RN Notified Lawernce Keas RN  Date Charge Nurse/RN Notified 03/25/22  Time Charge Nurse/RN Notified 0041  Assess: SIRS CRITERIA  SIRS Temperature  0  SIRS Pulse 1  SIRS Respirations  1  SIRS WBC 0  SIRS Score Sum  2

## 2022-03-25 NOTE — Progress Notes (Signed)
PROGRESS NOTE    Joe [name]  HBZ:[id number] DOB: 20-Jan-1956 DOA: 03/24/2022 PCP: Celene Squibb, MD   Brief Narrative:  HPI: Joe [name] is a 66 y.o. 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  Joe [name] denies chest pain, shortness of breath, nausea, vomiting, headache, sore throat, cough.   ED Course:  In the emergency department, Joe [name] was febrile with a temperature of 102.10F, tachypneic and tachycardic.  BP was soft at 104/74.  Work-up in the ED showed normal CBC except for leukocytosis, BMP showed hyponatremia, hyperglycemia, BUN/creatinine 18/1.49 (baseline creatinine at 0.9-1.1).  Blood culture pending. Chest x-ray showed no active disease She was started on IV ceftriaxone, Tylenol was given and patient was provided with IV hydration.  Hospitalist was asked to admit patient for further evaluation and management.  Assessment & Plan:   Principal Problem:   Sepsis secondary to UTI [facility]) Active Problems:   Essential hypertension   Obesity, Class III, BMI 40-49.9 (morbid obesity) (HCC)   Leukocytosis   Hyponatremia   AKI (acute kidney injury) ([place])   Left leg DVT ([place])  Sepsis secondary to UTI: Patient met sepsis criteria on admission due to being tachycardic, tachypneic, febrile and having leukocytosis.  Source of infection was UTI.  Continue Rocephin, follow culture and tailor antibiotics.  Acute hyponatremia: Resolved.  Acute kidney injury: Likely secondary to sepsis, creatinine is stable compared to yesterday.  Continue IV fluids.  Repeat labs in the morning.  Essential hypertension: Patient blood pressure is on the low side, will discontinue amlodipine.  Left leg  DVT: Continue Eliquis.  Obesity class III: Diet and lifestyle modification recommended and weight loss recommended as well.  DVT prophylaxis: SCDs Start: 03/25/22 0050 we will start Lovenox   Code Status: Full Code  Family Communication:  None present at bedside.  Plan of care discussed with patient in length and Joe [name]/she verbalized understanding and agreed with it.  Status is: Inpatient Remains inpatient appropriate because: Still septic.   Estimated body mass index is 41.67 kg/m as calculated from the following:   Height as of this encounter: _0  (1.753 m).   Weight as of this encounter: 128 kg.    Nutritional Assessment: Body mass index is 41.67 kg/m.[name] Seen by dietician.  I agree with the assessment and plan as outlined below: Nutrition Status:        . Skin Assessment: I have examined the patient's skin and I agree with the wound assessment as performed by the wound care RN as outlined below:    Consultants:  None  Procedures:  None  Antimicrobials:  Anti-infectives (From admission, onward)    Start     Dose/Rate Route Frequency Ordered Stop   03/25/22 1800  cefTRIAXone (ROCEPHIN) 1 g in sodium chloride 0.9 % 100 mL IVPB        1 g 200 mL/hr over 30 Minutes Intravenous Every 24 hours 03/25/22 0025     03/24/22 1815  cefTRIAXone (ROCEPHIN) 2 g in sodium chloride 0.9 % 100 mL IVPB        2 g 200 mL/hr over 30 Minutes Intravenous  Once 03/24/22 1808 03/24/22 1907         Subjective: Seen and examined.  Complains  of weakness.  No other complaint.  Improving.  Objective: Vitals:   03/25/22 0728 03/25/22 0830 03/25/22 0933 03/25/22 1020  BP: (!) 122/108 (!) 84/58 (!) 93/54 103/66  Pulse: 90 84 79 74  Resp: (!) _0 Temp: 99.9 F (37.7 C) 98.5 F (36.9 C) 97.9 F (36.6 C) 97.8 F (36.6 C)  TempSrc: Oral Oral Oral Oral  SpO2: 93% 94% 91% 93%  Weight:      Height:        Intake/Output Summary (Last 24 hours) at 03/25/2022 1108 Last data filed  at 03/25/2022 0900 Gross per 24 hour  Intake 1337.19 ml  Output --  Net 1337.19 ml   Filed Weights   03/24/22 1744 03/25/22 0041  Weight: 127.9 kg 128 kg    Examination:  General exam: Appears calm and comfortable  Respiratory system: Clear to auscultation. Respiratory effort normal. Cardiovascular system: S1 & S2 heard, RRR. No JVD, murmurs, rubs, gallops or clicks. No pedal edema. Gastrointestinal system: Abdomen is nondistended, soft and nontender. No organomegaly or masses felt. Normal bowel sounds heard. Central nervous system: Alert and oriented. No focal neurological deficits. Extremities: Symmetric 5 x 5 power. Skin: No rashes, lesions or ulcers Psychiatry: Judgement and insight appear normal. Mood & affect appropriate.    Data Reviewed: I have personally reviewed following labs and imaging studies  CBC: Recent Labs  Lab 03/24/22 1823 03/25/22 0419  WBC 16.1* 14.8*  NEUTROABS 13.0*  --   HGB 13.1 12.4*  HCT 38.4* 37.6*  MCV 85.5 88.1  PLT 235 595   Basic Metabolic Panel: Recent Labs  Lab 03/24/22 1823 03/25/22 0419  NA 131* 135  K 4.0 4.5  CL 99 99  CO2 26 27  GLUCOSE 122* 111*  BUN 18 18  CREATININE 1.49* 1.52*  CALCIUM 8.9 8.9  MG  --  1.8  PHOS  --  3.5   GFR: Estimated Creatinine Clearance: 65.9 mL/min (A) (by C-G formula based on SCr of 1.52 mg/dL (H)). Liver Function Tests: Recent Labs  Lab 03/24/22 1823 03/25/22 0419  AST 29 24  ALT 44 36  ALKPHOS 75 72  BILITOT 1.2 1.2  PROT 7.4 6.8  ALBUMIN 3.5 3.3*   No results for input(s): LIPASE, AMYLASE in the last 168 hours. No results for input(s): AMMONIA in the last 168 hours. Coagulation Profile: Recent Labs  Lab 03/24/22 1823  INR 1.4*   Cardiac Enzymes: No results for input(s): CKTOTAL, CKMB, CKMBINDEX, TROPONINI in the last 168 hours. BNP (last 3 results) No results for input(s): PROBNP in the last 8760 hours. HbA1C: No results for input(s): HGBA1C in the last 72  hours. CBG: No results for input(s): GLUCAP in the last 168 hours. Lipid Profile: No results for input(s): CHOL, HDL, LDLCALC, TRIG, CHOLHDL, LDLDIRECT in the last 72 hours. Thyroid Function Tests: No results for input(s): TSH, T4TOTAL, FREET4, T3FREE, THYROIDAB in the last 72 hours. Anemia Panel: No results for input(s): VITAMINB12, FOLATE, FERRITIN, TIBC, IRON, RETICCTPCT in the last 72 hours. Sepsis Labs: Recent Labs  Lab 03/24/22 1823  LATICACIDVEN 1.7    Recent Results (from the past 240 hour(s))  Culture, blood (Routine x 2)     Status: None (Preliminary result)   Collection Time: 03/24/22  6:23 PM   Specimen: Right Antecubital; Blood  Result Value Ref Range Status   Specimen Description RIGHT ANTECUBITAL  Final   Special Requests   Final    BOTTLES DRAWN AEROBIC AND ANAEROBIC Blood Culture  results may not be optimal due to an excessive volume of blood received in culture bottles   Culture   Final    NO GROWTH < 12 HOURS Performed at [facility], [address] [place], [place] [address]    Report Status PENDING  Incomplete  Culture, blood (Routine x 2)     Status: None (Preliminary result)   Collection Time: 03/24/22  6:34 PM   Specimen: BLOOD RIGHT HAND  Result Value Ref Range Status   Specimen Description BLOOD RIGHT HAND  Final   Special Requests   Final    BOTTLES DRAWN AEROBIC AND ANAEROBIC Blood Culture adequate volume   Culture   Final    NO GROWTH < 12 HOURS Performed at [facility], [address], [place], [place] [address]    Report Status PENDING  Incomplete     Radiology Studies: DG Chest Port 1 View  Result Date: 03/24/2022 CLINICAL DATA:  Possible sepsis. History of throat cancer, currently on chemotherapy. EXAM: PORTABLE CHEST 1 VIEW COMPARISON:  10/13/2021 prior studies FINDINGS: The cardiomediastinal silhouette is unremarkable. RIGHT IJ Port-A-Cath again noted with tip overlying the mid SVC. There is no evidence of focal airspace disease,  pulmonary edema, suspicious pulmonary nodule/mass, pleural effusion, or pneumothorax. No acute bony abnormalities are identified. IMPRESSION: No active disease. Electronically Signed   By: Margarette Canada M.D.   On: 03/24/2022 18:24    Scheduled Meds:  apixaban  5 mg Oral BID   feeding supplement  237 mL Oral BID BM   Continuous Infusions:  sodium chloride 100 mL/hr at 03/25/22 0647   cefTRIAXone (ROCEPHIN)  IV       LOS: 1 day   Darliss Cheney, MD [facility]  03/25/2022, 11:08 AM   *Please note that this is a verbal dictation therefore any spelling or grammatical errors are due to the "[place]" system interpretation.  Please page via [place] and do not message via secure chat for urgent patient care matters. Secure chat can be used for non urgent patient care matters.  How to contact the [facility] Attending or Consulting provider [place] or covering provider during after hours [place], for this patient?  Check the care team in [facility] and look for a) attending/consulting TRH provider listed and b) the [facility] team listed. Page or secure chat [phone]. Log into [email or web address] and use [place]'s universal password to access. If you do not have the password, please contact the hospital operator. Locate the [facility] provider you are looking for under [facility] and page to a number that you can be directly reached. If you still have difficulty reaching the provider, please page the [facility] (Director on Call) for the Hospitalists listed on amion for assistance.

## 2022-03-25 NOTE — TOC Initial Note (Signed)
Transition of Care [facility]) - Initial/Assessment Note    Patient Details  Name: Joe [name] MRN: [id number] Date of Birth: August 11, 1956  [facility]) CM/SW Contact:    Salome Arnt, [place] Phone Number: 03/25/2022, 11:29 AM  Clinical Narrative: Pt admitted with sepsis secondary to UTI. Assessment completed due to high risk readmission score. Pt reports he lives with his sister and he is fairly independent with ADLs at baseline. Pt ambulates with a cane. He drives himself to appointments. Pt plans to return home when medically stable. No needs reported at this time. TOC will continue to follow.                   Expected Discharge Plan: Home/Self Care Barriers to Discharge: Continued Medical Work up   Patient Goals and CMS Choice Patient states their goals for this hospitalization and ongoing recovery are:: return home   Choice offered to / list presented to : Patient  Expected Discharge Plan and Services Expected Discharge Plan: Home/Self Care In-house Referral: Clinical Social Work     Living arrangements for the past 2 months: Single Family Home                                      Prior Living Arrangements/Services Living arrangements for the past 2 months: Single Family Home Lives with:: Siblings Patient language and need for interpreter reviewed:: Yes Do you feel safe going back to the place where you live?: Yes      Need for Family Participation in Patient Care: No (Comment)   Current home services: DME (cane) Criminal Activity/Legal Involvement Pertinent to Current Situation/Hospitalization: No - Comment as needed  Activities of Daily Living Home Assistive Devices/Equipment: Eyeglasses, Cane (specify quad or straight) ADL Screening (condition at time of admission) Patient's cognitive ability adequate to safely complete daily activities?: Yes Is the patient deaf or have difficulty hearing?: No Does the patient have difficulty seeing,  even when wearing glasses/contacts?: No Does the patient have difficulty concentrating, remembering, or making decisions?: No Patient able to express need for assistance with ADLs?: Yes Does the patient have difficulty dressing or bathing?: No Independently performs ADLs?: Yes (appropriate for developmental age) Does the patient have difficulty walking or climbing stairs?: Yes Weakness of Legs: Both Weakness of Arms/Hands: Both  Permission Sought/Granted                  Emotional Assessment     Affect (typically observed): Appropriate Orientation: : Oriented to Self, Oriented to Place, Oriented to  Time, Oriented to Situation Alcohol / Substance Use: Not Applicable Psych Involvement: No (comment)  Admission diagnosis:  Dehydration [E86.0] Hyponatremia [E87.1] Acute UTI [N39.0] Acute febrile illness [R50.9] Generalized weakness [R53.1] AKI (acute kidney injury) ([place]) [N17.9] Sepsis secondary to UTI ([place]) [A41.9, N39.0] Patient Active Problem List   Diagnosis Date Noted   Leukocytosis 03/25/2022   Hyponatremia 03/25/2022   AKI (acute kidney injury) ([place]) 03/25/2022   Left leg DVT ([place]) 03/25/2022   Sepsis secondary to UTI ([place]) 66/29/4765   Grade III follicular lymphoma of lymph nodes of head ([place]) 01/14/2022   PICC (peripherally inserted central catheter) flush 09/07/2021   Encounter for antineoplastic chemotherapy    Syncope 08/16/2021   Neck mass    Lymphoma of lymph nodes of neck ([place])    Recurrent syncope 08/15/2021   Obesity, Class III, BMI 40-49.9 (morbid obesity) ([place])  08/15/2021   Counseling regarding advance care planning and goals of care 91/69/4503   Follicular lymphoma ([place]) 08/01/2021   Essential hypertension 08/01/2021   Hyperlipidemia 08/01/2021   OSA (obstructive sleep apnea) 08/01/2021   Depression 08/01/2021   Pulmonary embolism ([place]) 07/31/2021   PCP:  Celene Squibb, MD Pharmacy:   [place], [place] [address] [place] [address] Phone: [phone] Fax: [phone]     Social Determinants of Health (SDOH) Interventions    Readmission Risk Interventions    03/25/2022   11:26 AM 08/23/2021    2:21 PM  Readmission Risk Prevention Plan  Transportation Screening Complete Complete  PCP or Specialist Appt within 3-5 Days  Complete  HRI or Home Care Consult Complete Complete  Social Work Consult for [place] Planning/Counseling Complete Complete  Palliative Care Screening Not Applicable Not Applicable  Medication Review Press photographer) Complete Complete

## 2022-03-25 NOTE — Progress Notes (Signed)
Initial Nutrition Assessment  DOCUMENTATION CODES:   Morbid obesity  INTERVENTION:  - Liberalize diet from a heart healthy to a regular diet to provide widest variety of menu options to enhance nutritional adequacy  - Ensure Enlive po BID, each supplement provides 350 kcal and 20 grams of protein.  - MVI with minerals daily  NUTRITION DIAGNOSIS:   Increased nutrient needs related to acute illness, cancer and cancer related treatments as evidenced by estimated needs.  GOAL:   Patient will meet greater than or equal to 90% of their needs  MONITOR:   PO intake, Supplement acceptance, Diet advancement, Labs, Weight trends  REASON FOR ASSESSMENT:   Malnutrition Screening Tool    ASSESSMENT:   Pt admitted with 1 week onset of dysuria, generalized wekaness, decreased appetite and fever, found to have sepsis secondary to UTI. 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 Family present during assessment.   Per review of chart, noted pt has been followed by RD at outpatient [place]. During his radiation treatment, which ended ~1 month ago, pt states that he had a decline in meal intake d/t throat pain and swelling. Pt reports continuing to drink Boost as recommended as this was easy for him to swallow. He denies current swallowing difficulties however does endorse ongoing poor appetite. Despite poor appetite, he continues to try to eat 3 meals per day but they are small meals. The meal he recalls eating PTA was sweet potato casserole on Saturday. Nurse tech provided Ensure Max for pt to have during visit.   Meal completions: 06/05: 50%-breakfast  Pt endorses a decrease in his mobility as he reports weakness secondary to chemotherapy. Pt states that his wt prior to starting cancer treatments in November 2022, was ~ 365 lbs and his current  wt reported to be 282 lbs.  Reviewed wt hx. Pt noted to have had a 12.1% wt loss within the last 6 months which is significant for time frame.   Pt would likely benefit from a liberalized diet and continued use of nutrition supplements as pt is at risk for malnutrition given significant wt loss and ongoing poor appetite.   Edema: mild pitting BLE  Medications reviewed  Labs: Cr 1.52, GFR 51  NUTRITION - FOCUSED PHYSICAL EXAM:  Flowsheet Row Most Recent Value  Orbital Region No depletion  Upper Arm Region No depletion  Thoracic and Lumbar Region No depletion  Buccal Region No depletion  Temple Region No depletion  Clavicle Bone Region No depletion  Clavicle and Acromion Bone Region No depletion  Scapular Bone Region No depletion  Dorsal Hand No depletion  Patellar Region No depletion  Anterior Thigh Region No depletion  Posterior Calf Region No depletion  Edema (RD Assessment) Mild  [mild pitting BLE]  Hair Reviewed  Eyes Reviewed  Mouth Reviewed  Skin Reviewed  Nails Reviewed      Diet Order:   Diet Order             Diet regular Room service appropriate? Yes; Fluid consistency: Thin  Diet effective now                  EDUCATION NEEDS:   Education needs have been addressed  Skin:  Skin Assessment: Reviewed RN Assessment  Last BM:  6/3  Height:   Ht Readings from Last 1 Encounters:  03/25/22 '5\' 9"'$  (1.753 m)    Weight:   Wt Readings from Last 1 Encounters:  03/25/22 128 kg    Ideal Body Weight:  72.7 kg  BMI:  Body mass index is 41.67 kg/m.  Estimated Nutritional Needs:   Kcal:  2200-2400  Protein:  110-125g  Fluid:  >/=2.2L  Clayborne Dana, RDN, LDN Clinical Nutrition

## 2022-03-25 NOTE — Progress Notes (Signed)
Date and time results received: 03/25/22 1303   Test: blood culture Critical Value: (+) for gram negative  Name of Provider Notified: Pahwani  Orders Received? Or Actions Taken?: awaiting response

## 2022-03-25 NOTE — Progress Notes (Signed)
   03/25/22 0518  Assess: if the MEWS score is Yellow or Red  Were vital signs taken at a resting state? Yes  Focused Assessment No change from prior assessment  Does the patient meet 2 or more of the SIRS criteria? Yes  Does the patient have a confirmed or suspected source of infection? Yes  Provider and Rapid Response Notified? Yes  MEWS guidelines implemented *See Row Information* Yes  Treat  MEWS Interventions Administered scheduled meds/treatments  Take Vital Signs  Increase Vital Sign Frequency  Red: Q 1hr X 4 then Q 4hr X 4, if remains red, continue Q 4hrs  Escalate  MEWS: Escalate Red: discuss with charge nurse/RN and provider, consider discussing with RRT  Notify: Charge Nurse/RN  Name of Charge Nurse/RN Notified Lawernce Keas RN  Date Charge Nurse/RN Notified 03/25/22  Time Charge Nurse/RN Notified [id number]  Notify: Provider  Provider Name/Title Dr. Josephine Cables  Date Provider Notified 03/25/22  Time Provider Notified 0530  Method of Notification Page (secure Chat)  Notification Reason Other (Comment) (Red MEWS)

## 2022-03-25 NOTE — Progress Notes (Signed)
   03/25/22 1704  Vitals  Temp (!) 101.1 F (38.4 C)  Temp Source Oral  BP (!) 90/56  MAP (mmHg) 65  BP Location Left Arm  BP Method Automatic  Patient Position (if appropriate) Lying  Pulse Rate 100  Pulse Rate Source Dinamap  Resp 18  Level of Consciousness  Level of Consciousness Alert  MEWS COLOR  MEWS Score Color Yellow  Oxygen Therapy  SpO2 95 %  O2 Device Room Air  Pain Assessment  Pain Scale 0-10  Pain Score 0  MEWS Score  MEWS Temp 1  MEWS Systolic 1  MEWS Pulse 0  MEWS RR 0  MEWS LOC 0  MEWS Score 2   Pt lying quietly in bed, denies c/o other than feeling cold. Tolerating po liquids without n/v. VS as above, MD Pahwani updated on current condition.

## 2022-03-26 DIAGNOSIS — R7881 Bacteremia: Secondary | ICD-10-CM

## 2022-03-26 DIAGNOSIS — A419 Sepsis, unspecified organism: Secondary | ICD-10-CM | POA: Diagnosis not present

## 2022-03-26 DIAGNOSIS — N390 Urinary tract infection, site not specified: Secondary | ICD-10-CM | POA: Diagnosis not present

## 2022-03-26 LAB — BASIC METABOLIC PANEL
Anion gap: 8 (ref 5–15)
BUN: 21 mg/dL (ref 8–23)
CO2: 23 mmol/L (ref 22–32)
Calcium: 8.1 mg/dL — ABNORMAL LOW (ref 8.9–10.3)
Chloride: 102 mmol/L (ref 98–111)
Creatinine, Ser: 1.3 mg/dL — ABNORMAL HIGH (ref 0.61–1.24)
GFR, Estimated: >60 mL/min (ref >60)
Glucose, Bld: 117 mg/dL — ABNORMAL HIGH (ref 70–99)
Potassium: 3.4 mmol/L — ABNORMAL LOW (ref 3.5–5.1)
Sodium: 133 mmol/L — ABNORMAL LOW (ref 135–145)

## 2022-03-26 LAB — CBC WITH DIFFERENTIAL/PLATELET
Abs Immature Granulocytes: 0.05 10*3/uL (ref 0.00–0.07)
Basophils Absolute: 0 10*3/uL (ref 0.0–0.1)
Basophils Relative: 0 %
Eosinophils Absolute: 0.3 10*3/uL (ref 0.0–0.5)
Eosinophils Relative: 3 %
HCT: 32 % — ABNORMAL LOW (ref 39.0–52.0)
Hemoglobin: 10.9 g/dL — ABNORMAL LOW (ref 13.0–17.0)
Immature Granulocytes: 1 %
Lymphocytes Relative: 9 %
Lymphs Abs: 0.8 10*3/uL (ref 0.7–4.0)
MCH: 29.5 pg (ref 26.0–34.0)
MCHC: 34.1 g/dL (ref 30.0–36.0)
MCV: 86.5 fL (ref 80.0–100.0)
Monocytes Absolute: 0.8 10*3/uL (ref 0.1–1.0)
Monocytes Relative: 10 %
Neutro Abs: 6.7 10*3/uL (ref 1.7–7.7)
Neutrophils Relative %: 77 %
Platelets: 193 10*3/uL (ref 150–400)
RBC: 3.7 MIL/uL — ABNORMAL LOW (ref 4.22–5.81)
RDW: 16.4 % — ABNORMAL HIGH (ref 11.5–15.5)
WBC: 8.7 10*3/uL (ref 4.0–10.5)
nRBC: 0 % (ref 0.0–0.2)

## 2022-03-26 MED ORDER — SODIUM CHLORIDE 0.9 % IV SOLN: INTRAVENOUS | Status: DC

## 2022-03-26 MED ORDER — POTASSIUM CHLORIDE CRYS ER 20 MEQ PO TBCR
40.0000 meq | EXTENDED_RELEASE_TABLET | Freq: Once | ORAL | Status: AC
Start: 2022-03-26 — End: 2022-03-26
  Administered 2022-03-26: 40 meq via ORAL
  Filled 2022-03-26: qty 2

## 2022-03-26 MED ORDER — ZOLPIDEM TARTRATE 5 MG PO TABS
10.0000 mg | ORAL_TABLET | Freq: Every evening | ORAL | Status: DC | PRN
  Administered 2022-03-26: 10 mg via ORAL
  Filled 2022-03-26: qty 2

## 2022-03-26 NOTE — Progress Notes (Signed)
PROGRESS NOTE    Joe [name]  TXH:[id number] DOB: 08-Aug-1956 DOA: 03/24/2022 PCP: Celene Squibb, MD   Brief Narrative:  HPI: Joe [name] is a 66 y.o. 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  He denies chest pain, shortness of breath, nausea, vomiting, headache, sore throat, cough.   ED Course:  In the emergency department, he was febrile with a temperature of 102.40F, tachypneic and tachycardic.  BP was soft at 104/74.  Work-up in the ED showed normal CBC except for leukocytosis, BMP showed hyponatremia, hyperglycemia, BUN/creatinine 18/1.49 (baseline creatinine at 0.9-1.1).  Blood culture pending. Chest x-ray showed no active disease She was started on IV ceftriaxone, Tylenol was given and patient was provided with IV hydration.  Hospitalist was asked to admit patient for further evaluation and management.  Assessment & Plan:   Principal Problem:   Sepsis secondary to UTI [facility]) Active Problems:   Essential hypertension   Obesity, Class III, BMI 40-49.9 (morbid obesity) (HCC)   Leukocytosis   Hyponatremia   AKI (acute kidney injury) ([place])   Left leg DVT (HCC)  Sepsis secondary to UTI/gram-negative bacteremia: Patient met sepsis criteria on admission due to being tachycardic, tachypneic, febrile and having leukocytosis.  Source of infection was UTI.  He was still febrile yesterday, although leukocytosis has improved.  Blood cultures growing E. coli.  Continue Rocephin, follow sensitivities.  Acute hyponatremia: Stable.  Hypokalemia: We will replace.  Acute kidney injury: Likely secondary to sepsis, creatinine is slowly improving, will resume IV fluids.  Repeat  labs in the morning.  Essential hypertension: Patient blood pressure is on the low side, will continue to hold amlodipine.  Left leg DVT: Continue Eliquis.  Obesity class III: Diet and lifestyle modification recommended and weight loss recommended as well.  DVT prophylaxis: SCDs Start: 03/25/22 0050 Eliquis   Code Status: Full Code  Family Communication:  None present at bedside.  Plan of care discussed with patient in length and he/she verbalized understanding and agreed with it.  Status is: Inpatient Remains inpatient appropriate because: Still septic.   Estimated body mass index is 41.67 kg/m as calculated from the following:   Height as of this encounter: '5\' 9"'  (1.753 m).   Weight as of this encounter: 128 kg.    Nutritional Assessment: Body mass index is 41.67 kg/m.[name] Seen by dietician.  I agree with the assessment and plan as outlined below: Nutrition Status: Nutrition Problem: Increased nutrient needs Etiology: acute illness, cancer and cancer related treatments Signs/Symptoms: estimated needs Interventions: Ensure Enlive (each supplement provides 350kcal and 20 grams of protein), MVI, Liberalize Diet  . Skin Assessment: I have examined the patient's skin and I agree with the wound assessment as performed by the wound care RN as outlined below:    Consultants:  None  Procedures:  None  Antimicrobials:  Anti-infectives (From admission, onward)    Start     Dose/Rate Route Frequency Ordered Stop   03/25/22 1800  cefTRIAXone (ROCEPHIN) 1 g in sodium chloride 0.9 % 100 mL IVPB  Status:  Discontinued        1 g 200 mL/hr over 30 Minutes Intravenous Every 24 hours 03/25/22 0025 03/25/22 1346   03/25/22 1800  cefTRIAXone (ROCEPHIN)  1 g in sodium chloride 0.9 % 100 mL IVPB  Status:  Discontinued        1 g 200 mL/hr over 30 Minutes Intravenous Every 24 hours 03/25/22 1346 03/25/22 1347   03/25/22 1800  cefTRIAXone (ROCEPHIN) 2 g in sodium chloride 0.9 % 100 mL IVPB         2 g 200 mL/hr over 30 Minutes Intravenous Every 24 hours 03/25/22 1347     03/24/22 1815  cefTRIAXone (ROCEPHIN) 2 g in sodium chloride 0.9 % 100 mL IVPB        2 g 200 mL/hr over 30 Minutes Intravenous  Once 03/24/22 1808 03/24/22 1907         Subjective: Seen and examined.  Feels better.  No complaints.  Objective: Vitals:   03/25/22 1837 03/25/22 2217 03/26/22 0525 03/26/22 0900  BP: 108/65 112/63 (!) 99/52 (!) 111/54  Pulse: 98 91 91 91  Resp: '20 20 20 18  ' Temp:  98.8 F (37.1 C) 98.2 F (36.8 C) 98.1 F (36.7 C)  TempSrc:  Oral Oral Oral  SpO2: 93% 90% 93% 93%  Weight:      Height:        Intake/Output Summary (Last 24 hours) at 03/26/2022 1035 Last data filed at 03/26/2022 0855 Gross per 24 hour  Intake 2755.76 ml  Output 2150 ml  Net 605.76 ml    Filed Weights   03/24/22 1744 03/25/22 0041  Weight: 127.9 kg 128 kg    Examination:  General exam: Appears calm and comfortable  Respiratory system: Clear to auscultation. Respiratory effort normal. Cardiovascular system: S1 & S2 heard, RRR. No JVD, murmurs, rubs, gallops or clicks. No pedal edema. Gastrointestinal system: Abdomen is nondistended, soft and nontender. No organomegaly or masses felt. Normal bowel sounds heard. Central nervous system: Alert and oriented. No focal neurological deficits. Extremities: Symmetric 5 x 5 power. Skin: No rashes, lesions or ulcers.  Psychiatry: Judgement and insight appear normal. Mood & affect appropriate.    Data Reviewed: I have personally reviewed following labs and imaging studies  CBC: Recent Labs  Lab 03/24/22 1823 03/25/22 0419 03/26/22 0826  WBC 16.1* 14.8* 8.7  NEUTROABS 13.0*  --  6.7  HGB 13.1 12.4* 10.9*  HCT 38.4* 37.6* 32.0*  MCV 85.5 88.1 86.5  PLT 235 212 161    Basic Metabolic Panel: Recent Labs  Lab 03/24/22 1823 03/25/22 0419 03/26/22 0826  NA 131* 135 133*  K 4.0 4.5 3.4*  CL 99 99 102  CO2 '26 27 23  ' GLUCOSE 122* 111* 117*   BUN '18 18 21  ' CREATININE 1.49* 1.52* 1.30*  CALCIUM 8.9 8.9 8.1*  MG  --  1.8  --   PHOS  --  3.5  --     GFR: Estimated Creatinine Clearance: 77 mL/min (A) (by C-G formula based on SCr of 1.3 mg/dL (H)). Liver Function Tests: Recent Labs  Lab 03/24/22 1823 03/25/22 0419  AST 29 24  ALT 44 36  ALKPHOS 75 72  BILITOT 1.2 1.2  PROT 7.4 6.8  ALBUMIN 3.5 3.3*    No results for input(s): LIPASE, AMYLASE in the last 168 hours. No results for input(s): AMMONIA in the last 168 hours. Coagulation Profile: Recent Labs  Lab 03/24/22 1823  INR 1.4*    Cardiac Enzymes: No results for input(s): CKTOTAL, CKMB, CKMBINDEX, TROPONINI in the last 168 hours. BNP (last 3 results) No results for input(s): PROBNP in the last 8760 hours. HbA1C: No results for  input(s): HGBA1C in the last 72 hours. CBG: No results for input(s): GLUCAP in the last 168 hours. Lipid Profile: No results for input(s): CHOL, HDL, LDLCALC, TRIG, CHOLHDL, LDLDIRECT in the last 72 hours. Thyroid Function Tests: No results for input(s): TSH, T4TOTAL, FREET4, T3FREE, THYROIDAB in the last 72 hours. Anemia Panel: No results for input(s): VITAMINB12, FOLATE, FERRITIN, TIBC, IRON, RETICCTPCT in the last 72 hours. Sepsis Labs: Recent Labs  Lab 03/24/22 1823  LATICACIDVEN 1.7     Recent Results (from the past 240 hour(s))  Culture, blood (Routine x 2)     Status: None (Preliminary result)   Collection Time: 03/24/22  6:23 PM   Specimen: Right Antecubital; Blood  Result Value Ref Range Status   Specimen Description   Final    RIGHT ANTECUBITAL Performed at [facility], [address], [place], [place] [address]    Special Requests   Final    BOTTLES DRAWN AEROBIC AND ANAEROBIC Blood Culture results may not be optimal due to an excessive volume of blood received in culture bottles Performed at [facility], [address] [place], [place] [address]    Culture  Setup Time   Final    GRAM NEGATIVE RODS Gram  Stain Report Called to,Read Back By and Verified With: C.REYNOLDS @ 1300 BY STEPHTR ANAEROBIC BOTTLE ONLY CRITICAL RESULT CALLED TO, READ BACK BY AND VERIFIED WITH: RN Aleen Sells [phone] '@[phone]'  FH Performed at [facility], [place] [address] [place], [place] [address]    Culture GRAM NEGATIVE RODS  Final   Report Status PENDING  Incomplete  Blood Culture ID Panel (Reflexed)     Status: Abnormal   Collection Time: 03/24/22  6:23 PM  Result Value Ref Range Status   Enterococcus faecalis NOT DETECTED NOT DETECTED Final   Enterococcus Faecium NOT DETECTED NOT DETECTED Final   Listeria monocytogenes NOT DETECTED NOT DETECTED Final   Staphylococcus species NOT DETECTED NOT DETECTED Final   Staphylococcus aureus (BCID) NOT DETECTED NOT DETECTED Final   Staphylococcus epidermidis NOT DETECTED NOT DETECTED Final   Staphylococcus lugdunensis NOT DETECTED NOT DETECTED Final   Streptococcus species NOT DETECTED NOT DETECTED Final   Streptococcus agalactiae NOT DETECTED NOT DETECTED Final   Streptococcus pneumoniae NOT DETECTED NOT DETECTED Final   Streptococcus pyogenes NOT DETECTED NOT DETECTED Final   A.calcoaceticus-baumannii NOT DETECTED NOT DETECTED Final   Bacteroides fragilis NOT DETECTED NOT DETECTED Final   Enterobacterales DETECTED (A) NOT DETECTED Final    Comment: Enterobacterales represent a large order of gram negative bacteria, not a single organism. REPEAT WITH NEW VIAL OF CTRL RN C. REYNOLDS [phone] '@[phone]'  FH    Enterobacter cloacae complex NOT DETECTED NOT DETECTED Final   Escherichia coli DETECTED (A) NOT DETECTED Final    Comment: CRITICAL RESULT CALLED TO, READ BACK BY AND VERIFIED WITH: RN C. Doy Mince [phone] '@[phone]'  FH    Klebsiella aerogenes NOT DETECTED NOT DETECTED Final   Klebsiella oxytoca NOT DETECTED NOT DETECTED Final   Klebsiella pneumoniae NOT DETECTED NOT DETECTED Final   Proteus species NOT DETECTED NOT DETECTED Final   Salmonella species NOT DETECTED NOT  DETECTED Final   Serratia marcescens NOT DETECTED NOT DETECTED Final   Haemophilus influenzae NOT DETECTED NOT DETECTED Final   Neisseria meningitidis NOT DETECTED NOT DETECTED Final   Pseudomonas aeruginosa NOT DETECTED NOT DETECTED Final   Stenotrophomonas maltophilia NOT DETECTED NOT DETECTED Final   Candida albicans NOT DETECTED NOT DETECTED Final   Candida auris NOT DETECTED  NOT DETECTED Final   Candida glabrata NOT DETECTED NOT DETECTED Final   Candida krusei NOT DETECTED NOT DETECTED Final   Candida parapsilosis NOT DETECTED NOT DETECTED Final   Candida tropicalis NOT DETECTED NOT DETECTED Final   Cryptococcus neoformans/gattii NOT DETECTED NOT DETECTED Final   CTX-M ESBL NOT DETECTED NOT DETECTED Final   Carbapenem resistance IMP NOT DETECTED NOT DETECTED Final   Carbapenem resistance KPC NOT DETECTED NOT DETECTED Final   Carbapenem resistance NDM NOT DETECTED NOT DETECTED Final   Carbapenem resist OXA 48 LIKE NOT DETECTED NOT DETECTED Final   Carbapenem resistance VIM NOT DETECTED NOT DETECTED Final    Comment: Performed at [facility], [place] [address], [place], [place] [address]  Culture, blood (Routine x 2)     Status: None (Preliminary result)   Collection Time: 03/24/22  6:34 PM   Specimen: BLOOD RIGHT HAND  Result Value Ref Range Status   Specimen Description BLOOD RIGHT HAND  Final   Special Requests   Final    BOTTLES DRAWN AEROBIC AND ANAEROBIC Blood Culture adequate volume   Culture   Final    NO GROWTH 2 DAYS Performed at [facility], [address] [place], [place] [address]    Report Status PENDING  Incomplete      Radiology Studies: DG Chest Port 1 View  Result Date: 03/24/2022 CLINICAL DATA:  Possible sepsis. History of throat cancer, currently on chemotherapy. EXAM: PORTABLE CHEST 1 VIEW COMPARISON:  10/13/2021 prior studies FINDINGS: The cardiomediastinal silhouette is unremarkable. RIGHT IJ Port-A-Cath again noted with tip overlying the mid SVC.  There is no evidence of focal airspace disease, pulmonary edema, suspicious pulmonary nodule/mass, pleural effusion, or pneumothorax. No acute bony abnormalities are identified. IMPRESSION: No active disease. Electronically Signed   By: Margarette Canada M.D.   On: 03/24/2022 18:24    Scheduled Meds:  apixaban  5 mg Oral BID   feeding supplement  237 mL Oral BID BM   multivitamin with minerals  1 tablet Oral Daily   potassium chloride  40 mEq Oral Once   Continuous Infusions:  cefTRIAXone (ROCEPHIN)  IV Stopped (03/25/22 1736)     LOS: 2 days   Darliss Cheney, MD [facility]  03/26/2022, 10:35 AM   *Please note that this is a verbal dictation therefore any spelling or grammatical errors are due to the "[place]" system interpretation.  Please page via [place] and do not message via secure chat for urgent patient care matters. Secure chat can be used for non urgent patient care matters.  How to contact the [facility] Attending or Consulting provider [place] or covering provider during after hours [employer] [facility], for this patient?  Check the care team in [facility] and look for a) attending/consulting TRH provider listed and b) the [facility] team listed. Page or secure chat [phone]. Log into [email or web address] and use [place]'s universal password to access. If you do not have the password, please contact the hospital operator. Locate the [facility] provider you are looking for under [facility] and page to a number that you can be directly reached. If you still have difficulty reaching the provider, please page the [facility] (Director on Call) for the Hospitalists listed on amion for assistance.

## 2022-03-26 NOTE — TOC Progression Note (Signed)
Transition of Care [facility]) - Progression Note    Patient Details  Name: LINDY PENNISI MRN: [id number] Date of Birth: Mar 18, 1956  Transition of [facility]) CM/SW Contact  Salome Arnt, [place] Phone Number: 03/26/2022, 2:02 PM  Clinical Narrative:  PT recommending outpatient PT. Discussed with pt who is agreeable to [facility]. Referral made.      Expected Discharge Plan: Home/Self Care Barriers to Discharge: Continued Medical Work up  Expected Discharge Plan and Services Expected Discharge Plan: Home/Self Care In-house Referral: Clinical Social Work     Living arrangements for the past 2 months: Single Family Home                                       Social Determinants of Health (SDOH) Interventions    Readmission Risk Interventions    03/25/2022   11:26 AM 08/23/2021    2:21 PM  Readmission Risk Prevention Plan  Transportation Screening Complete Complete  PCP or Specialist Appt within 3-5 Days  Complete  HRI or Home Care Consult Complete Complete  Social Work Consult for [place] Planning/Counseling Complete Complete  Palliative Care Screening Not Applicable Not Applicable  Medication Review Press photographer) Complete Complete

## 2022-03-26 NOTE — Evaluation (Addendum)
Physical Therapy Evaluation Patient Details Name: Joe [name] MRN: [id number] DOB: 03/22/1956 Today's Date: 03/26/2022  History of Present Illness  Joe [name] is a 66 y.o. 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  He denies chest pain, shortness of breath, nausea, vomiting, headache, sore throat, cough.   Clinical Impression  Patient agreeable to participation in PT evaluation today. Patient reports he has been walking about his hospital room when he hasn't been attached to his IV pole. Patient reports finishing chemo and radiation about six months ago with a continued decline in functional abilities. Reports he has been living with his sister as he has not been able to return to work due to his decline in function. Patient overall performed well with mobility activities. He did exhibit safety awareness when transferring and ambulating with IV pole in unfamiliar environment. He also exhibited a wide base of support, decrease cadence, short, equal length steps and decreased stride length indicating an increased risk for falls. Patient evaluated by Physical Therapy with no further acute PT needs identified. All education has been completed and the patient has no further questions. See below for any follow-up Physical Therapy or equipment needs. PT is signing off. Thank you for this referral.        Recommendations for follow up therapy are one component of a multi-disciplinary discharge planning process, led by the attending physician.  Recommendations may be updated based on patient status, additional functional criteria and insurance authorization.  Follow Up Recommendations  Outpatient PT    Assistance Recommended at Discharge PRN  Patient can return home with the following       Equipment Recommendations None recommended by PT  Recommendations for Other Services       Functional Status Assessment Patient has had a recent decline in their functional status and demonstrates the ability to make significant improvements in function in a reasonable and predictable amount of time.     Precautions / Restrictions Precautions Precautions: None Restrictions Weight Bearing Restrictions: No      Mobility  Bed Mobility Overal bed mobility: Modified Independent      Transfers Overall transfer level: Modified independent Equipment used: Ambulation equipment used      Ambulation/Gait Ambulation/Gait assistance: Modified independent (Device/Increase time) Gait Distance (Feet): 250 Feet Assistive device: IV Pole Gait Pattern/deviations: Step-through pattern, Decreased step length - right, Decreased step length - left, Decreased stride length, Wide base of support Gait velocity: decreased     General Gait Details: somehwat slow labored cadence with use of unfamiliar IV pole; patient's cadence measured for safety; on room air; no gross abnormalities  Stairs            Wheelchair Mobility    Modified Rankin (Stroke Patients Only)       Balance Overall balance assessment: Mild deficits observed, not formally tested         Pertinent Vitals/Pain Pain Assessment Pain Assessment: No/denies pain    Home Living Family/patient expects to be discharged to:: Private residence Living Arrangements: Other relatives Available Help at Discharge: Family;Available PRN/intermittently Type of Home: House Home Access: Stairs to enter Entrance Stairs-Rails: Right;Left;Can reach both Entrance Stairs-Number of Steps: 3   Home Layout: One level Home Equipment:  Cane - single point      Prior Function Prior Level of Function :  Driving;Independent/Modified Independent         Hand Dominance        Extremity/Trunk Assessment   Upper Extremity Assessment Upper Extremity Assessment: Overall WFL for tasks assessed    Lower Extremity Assessment Lower Extremity Assessment: Overall WFL for tasks assessed    Cervical / Trunk Assessment Cervical / Trunk Assessment: Normal  Communication   Communication: No difficulties  Cognition Arousal/Alertness: Awake/alert Behavior During Therapy: WFL for tasks assessed/performed Overall Cognitive Status: Within Functional Limits for tasks assessed          General Comments      Exercises     Assessment/Plan    PT Assessment All further PT needs can be met in the next venue of care  PT Problem List Decreased mobility;Decreased activity tolerance;Decreased balance       PT Treatment Interventions      PT Goals (Current goals can be found in the Care Plan section)  Acute Rehab PT Goals Patient Stated Goal: Return to prior level of function before chemo/radiation PT Goal Formulation: With patient Time For Goal Achievement: 04/09/22 Potential to Achieve Goals: Good    Frequency          AM-PAC PT "6 Clicks" Mobility  Outcome Measure Help needed turning from your back to your side while in a flat bed without using bedrails?: None Help needed moving from lying on your back to sitting on the side of a flat bed without using bedrails?: None Help needed moving to and from a bed to a chair (including a wheelchair)?: None Help needed standing up from a chair using your arms (e.g., wheelchair or bedside chair)?: None Help needed to walk in hospital room?: None Help needed climbing 3-5 steps with a railing? : A Little 6 Click Score: 23    End of Session   Activity Tolerance: Patient tolerated treatment well Patient left: in chair;with call bell/phone within reach Nurse Communication: Mobility status PT Visit Diagnosis: Difficulty in walking, not  elsewhere classified (R26.2)    Time: 1300-1320 PT Time Calculation (min) (ACUTE ONLY): 20 min   Charges:   PT Evaluation $PT Eval Low Complexity: 1 Low          Quantia Grullon D. Hartnett-Rands, MS, PT Per [place] [phone]  Pamala Hurry  Hartnett-Rands 03/26/2022, 1:37 PM

## 2022-03-27 ENCOUNTER — Other Ambulatory Visit: Payer: Self-pay | Admitting: Oncology

## 2022-03-27 ENCOUNTER — Encounter: Payer: Self-pay | Admitting: Hematology

## 2022-03-27 DIAGNOSIS — A419 Sepsis, unspecified organism: Secondary | ICD-10-CM | POA: Diagnosis not present

## 2022-03-27 DIAGNOSIS — N390 Urinary tract infection, site not specified: Secondary | ICD-10-CM | POA: Diagnosis not present

## 2022-03-27 LAB — BASIC METABOLIC PANEL
Anion gap: 9 (ref 5–15)
BUN: 15 mg/dL (ref 8–23)
CO2: 23 mmol/L (ref 22–32)
Calcium: 8.5 mg/dL — ABNORMAL LOW (ref 8.9–10.3)
Chloride: 104 mmol/L (ref 98–111)
Creatinine, Ser: 1.02 mg/dL (ref 0.61–1.24)
GFR, Estimated: >60 mL/min (ref >60)
Glucose, Bld: 97 mg/dL (ref 70–99)
Potassium: 3.8 mmol/L (ref 3.5–5.1)
Sodium: 136 mmol/L (ref 135–145)

## 2022-03-27 LAB — CBC WITH DIFFERENTIAL/PLATELET
Abs Immature Granulocytes: 0.03 10*3/uL (ref 0.00–0.07)
Basophils Absolute: 0 10*3/uL (ref 0.0–0.1)
Basophils Relative: 1 %
Eosinophils Absolute: 0.3 10*3/uL (ref 0.0–0.5)
Eosinophils Relative: 4 %
HCT: 32.6 % — ABNORMAL LOW (ref 39.0–52.0)
Hemoglobin: 10.6 g/dL — ABNORMAL LOW (ref 13.0–17.0)
Immature Granulocytes: 0 %
Lymphocytes Relative: 10 %
Lymphs Abs: 0.7 10*3/uL (ref 0.7–4.0)
MCH: 28.5 pg (ref 26.0–34.0)
MCHC: 32.5 g/dL (ref 30.0–36.0)
MCV: 87.6 fL (ref 80.0–100.0)
Monocytes Absolute: 0.8 10*3/uL (ref 0.1–1.0)
Monocytes Relative: 11 %
Neutro Abs: 4.9 10*3/uL (ref 1.7–7.7)
Neutrophils Relative %: 74 %
Platelets: 246 10*3/uL (ref 150–400)
RBC: 3.72 MIL/uL — ABNORMAL LOW (ref 4.22–5.81)
RDW: 16.3 % — ABNORMAL HIGH (ref 11.5–15.5)
WBC: 6.7 10*3/uL (ref 4.0–10.5)
nRBC: 0 % (ref 0.0–0.2)

## 2022-03-27 LAB — MAGNESIUM: Magnesium: 1.8 mg/dL (ref 1.7–2.4)

## 2022-03-27 NOTE — Progress Notes (Signed)
                                                                                                                                                             Patient Name: [name] MRN: [id number] DOB: Sep 15, 1956 Referring Physician: Sullivan Lone (Profile Not Attached) Date of Service: 02/05/2022 [facility], [place]                                                        End Of Treatment Note  Diagnoses: C82.21-Follicular lymphoma grade III, unspecified, lymph nodes of head, face, and neck  Cancer Staging:  Cancer Staging  Grade III follicular lymphoma of lymph nodes of head ([place]) Staging form: Hodgkin and Non-Hodgkin Lymphoma, AJCC 8th Edition - Clinical stage from 5/46/5035: Stage IV (Follicular lymphoma) - Signed by Eppie Gibson, MD on 01/14/2022   Intent: Curative  Radiation Treatment Dates: 01/23/2022 through 02/05/2022 Site Technique Total Dose (Gy) Dose per Fx (Gy) Completed Fx Beam Energies  Neck: HN_L_Neck IMRT 25/25 2.5 10/10 6X   Narrative: The patient tolerated radiation therapy relatively well.   Plan: The patient will follow-up with radiation oncology in 2-3 wks. -----------------------------------  Eppie Gibson, MD

## 2022-03-27 NOTE — Progress Notes (Signed)
OT Cancellation Note  Patient Details Name: Joe [name] MRN: [id number] DOB: October 02, 1956   Cancelled Treatment:    Reason Eval/Treat Not Completed: OT screened, no needs identified, will sign off. Discussed pt with physical therapist who evaluated the pt. Pt reportedly did well with PT and is not in need of OT evaluation. Pt will be removed from OT list.   Larey Seat OT, MOT   Larey Seat 03/27/2022, 8:16 AM

## 2022-03-27 NOTE — Progress Notes (Signed)
PROGRESS NOTE    Joe [name]  IWL:[id number] DOB: 1956/07/03 DOA: 03/24/2022 PCP: Celene Squibb, MD   Brief Narrative:  HPI: Joe [name] is a 66 y.o. 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  He denies chest pain, shortness of breath, nausea, vomiting, headache, sore throat, cough.   ED Course:  In the emergency department, he was febrile with a temperature of 102.45F, tachypneic and tachycardic.  BP was soft at 104/74.  Work-up in the ED showed normal CBC except for leukocytosis, BMP showed hyponatremia, hyperglycemia, BUN/creatinine 18/1.49 (baseline creatinine at 0.9-1.1).  Blood culture pending. Chest x-ray showed no active disease She was started on IV ceftriaxone, Tylenol was given and patient was provided with IV hydration.  Hospitalist was asked to admit patient for further evaluation and management.  Assessment & Plan:   Principal Problem:   Sepsis secondary to UTI [facility]) Active Problems:   Essential hypertension   Obesity, Class III, BMI 40-49.9 (morbid obesity) (HCC)   Leukocytosis   Hyponatremia   AKI (acute kidney injury) ([facility])   Left leg DVT (HCC)   E coli bacteremia  Sepsis secondary to UTI/gram-negative bacteremia: Patient met sepsis criteria on admission due to being tachycardic, tachypneic, febrile and having leukocytosis.  Source of infection was UTI.  Blood cultures growing E. Coli but interestingly, urine was never sent for the culture.  Patient is improving clinically, no fever and no leukocytosis.  Sensitivities still pending, will continue Rocephin.    Acute hyponatremia: Resolved  Hypokalemia: Resolved  Acute kidney injury: Likely  secondary to sepsis, resolved, will stop IV fluids.  Essential hypertension: Patient blood pressure is on the low side, will continue to hold amlodipine.  Left leg DVT: Continue Eliquis.  Obesity class III: Diet and lifestyle modification recommended and weight loss recommended as well.  DVT prophylaxis: SCDs Start: 03/25/22 0050 Eliquis   Code Status: Full Code  Family Communication:  None present at bedside.  Plan of care discussed with patient in length and he/she verbalized understanding and agreed with it.  Status is: Inpatient Remains inpatient appropriate because: Improving, sensitivities pending, discharge hopefully tomorrow.   Estimated body mass index is 41.67 kg/m as calculated from the following:   Height as of this encounter: '5\' 9"'  (1.753 m).   Weight as of this encounter: 128 kg.    Nutritional Assessment: Body mass index is 41.67 kg/m.[name] Seen by dietician.  I agree with the assessment and plan as outlined below: Nutrition Status: Nutrition Problem: Increased nutrient needs Etiology: acute illness, cancer and cancer related treatments Signs/Symptoms: estimated needs Interventions: Ensure Enlive (each supplement provides 350kcal and 20 grams of protein), MVI, Liberalize Diet  . Skin Assessment: I have examined the patient's skin and I agree with the wound assessment as performed by the wound care RN as outlined below:    Consultants:  None  Procedures:  None  Antimicrobials:  Anti-infectives (From admission, onward)    Start     Dose/Rate Route Frequency Ordered Stop   03/25/22 1800  cefTRIAXone (ROCEPHIN) 1 g in sodium chloride 0.9 % 100 mL IVPB  Status:  Discontinued        1 g 200 mL/hr over 30 Minutes Intravenous Every 24 hours  03/25/22 0025 03/25/22 1346   03/25/22 1800  cefTRIAXone (ROCEPHIN) 1 g in sodium chloride 0.9 % 100 mL IVPB  Status:  Discontinued        1 g 200 mL/hr over 30 Minutes Intravenous Every 24 hours 03/25/22 1346 03/25/22 1347    03/25/22 1800  cefTRIAXone (ROCEPHIN) 2 g in sodium chloride 0.9 % 100 mL IVPB        2 g 200 mL/hr over 30 Minutes Intravenous Every 24 hours 03/25/22 1347     03/24/22 1815  cefTRIAXone (ROCEPHIN) 2 g in sodium chloride 0.9 % 100 mL IVPB        2 g 200 mL/hr over 30 Minutes Intravenous  Once 03/24/22 1808 03/24/22 1907         Subjective: Seen and examined.  No complaints other than just body ache and weakness.  Objective: Vitals:   03/26/22 1416 03/26/22 2141 03/26/22 2200 03/27/22 0501  BP: 101/64 (!) 112/56  (!) 102/56  Pulse: 87 81  83  Resp: '20 18  20  ' Temp: 98.2 F (36.8 C) 100.1 F (37.8 C) 98.5 F (36.9 C) 99.2 F (37.3 C)  TempSrc: Oral Oral Oral Oral  SpO2: 97% 95%  92%  Weight:      Height:        Intake/Output Summary (Last 24 hours) at 03/27/2022 1210 Last data filed at 03/27/2022 0945 Gross per 24 hour  Intake 1850.83 ml  Output 3100 ml  Net -1249.17 ml    Filed Weights   03/24/22 1744 03/25/22 0041  Weight: 127.9 kg 128 kg    Examination:  General exam: Appears calm and comfortable  Respiratory system: Clear to auscultation. Respiratory effort normal. Cardiovascular system: S1 & S2 heard, RRR. No JVD, murmurs, rubs, gallops or clicks. No pedal edema. Gastrointestinal system: Abdomen is nondistended, soft and nontender. No organomegaly or masses felt. Normal bowel sounds heard. Central nervous system: Alert and oriented. No focal neurological deficits. Extremities: Symmetric 5 x 5 power. Skin: No rashes, lesions or ulcers.  Psychiatry: Judgement and insight appear normal. Mood & affect appropriate.   Data Reviewed: I have personally reviewed following labs and imaging studies  CBC: Recent Labs  Lab 03/24/22 1823 03/25/22 0419 03/26/22 0826 03/27/22 0442  WBC 16.1* 14.8* 8.7 6.7  NEUTROABS 13.0*  --  6.7 4.9  HGB 13.1 12.4* 10.9* 10.6*  HCT 38.4* 37.6* 32.0* 32.6*  MCV 85.5 88.1 86.5 87.6  PLT 235 212 193 202    Basic Metabolic  Panel: Recent Labs  Lab 03/24/22 1823 03/25/22 0419 03/26/22 0826 03/27/22 0442  NA 131* 135 133* 136  K 4.0 4.5 3.4* 3.8  CL 99 99 102 104  CO2 '26 27 23 23  ' GLUCOSE 122* 111* 117* 97  BUN '18 18 21 15  ' CREATININE 1.49* 1.52* 1.30* 1.02  CALCIUM 8.9 8.9 8.1* 8.5*  MG  --  1.8  --  1.8  PHOS  --  3.5  --   --     GFR: Estimated Creatinine Clearance: 98.1 mL/min (by C-G formula based on SCr of 1.02 mg/dL). Liver Function Tests: Recent Labs  Lab 03/24/22 1823 03/25/22 0419  AST 29 24  ALT 44 36  ALKPHOS 75 72  BILITOT 1.2 1.2  PROT 7.4 6.8  ALBUMIN 3.5 3.3*    No results for input(s): LIPASE, AMYLASE in the last 168 hours. No results for input(s): AMMONIA in the last 168 hours. Coagulation Profile: Recent Labs  Lab 03/24/22 1823  INR  1.4*    Cardiac Enzymes: No results for input(s): CKTOTAL, CKMB, CKMBINDEX, TROPONINI in the last 168 hours. BNP (last 3 results) No results for input(s): PROBNP in the last 8760 hours. HbA1C: No results for input(s): HGBA1C in the last 72 hours. CBG: No results for input(s): GLUCAP in the last 168 hours. Lipid Profile: No results for input(s): CHOL, HDL, LDLCALC, TRIG, CHOLHDL, LDLDIRECT in the last 72 hours. Thyroid Function Tests: No results for input(s): TSH, T4TOTAL, FREET4, T3FREE, THYROIDAB in the last 72 hours. Anemia Panel: No results for input(s): VITAMINB12, FOLATE, FERRITIN, TIBC, IRON, RETICCTPCT in the last 72 hours. Sepsis Labs: Recent Labs  Lab 03/24/22 1823  LATICACIDVEN 1.7     Recent Results (from the past 240 hour(s))  Culture, blood (Routine x 2)     Status: None (Preliminary result)   Collection Time: 03/24/22  6:23 PM   Specimen: Right Antecubital; Blood  Result Value Ref Range Status   Specimen Description   Final    RIGHT ANTECUBITAL Performed at [facility], [address] [place], [place] [address]    Special Requests   Final    BOTTLES DRAWN AEROBIC AND ANAEROBIC Blood Culture results may  not be optimal due to an excessive volume of blood received in culture bottles Performed at [facility], [address], [place], [place] [address]    Culture  Setup Time   Final    GRAM NEGATIVE RODS Gram Stain Report Called to,Read Back By and Verified With: C.REYNOLDS @ 1300 BY STEPHTR ANAEROBIC BOTTLE ONLY CRITICAL RESULT CALLED TO, READ BACK BY AND VERIFIED WITH: RN Aleen Sells [phone] '@[phone]'  FH Performed at [facility], [place] [address], [place], [place] [address]    Culture GRAM NEGATIVE RODS  Final   Report Status PENDING  Incomplete  Blood Culture ID Panel (Reflexed)     Status: Abnormal   Collection Time: 03/24/22  6:23 PM  Result Value Ref Range Status   Enterococcus faecalis NOT DETECTED NOT DETECTED Final   Enterococcus Faecium NOT DETECTED NOT DETECTED Final   Listeria monocytogenes NOT DETECTED NOT DETECTED Final   Staphylococcus species NOT DETECTED NOT DETECTED Final   Staphylococcus aureus (BCID) NOT DETECTED NOT DETECTED Final   Staphylococcus epidermidis NOT DETECTED NOT DETECTED Final   Staphylococcus lugdunensis NOT DETECTED NOT DETECTED Final   Streptococcus species NOT DETECTED NOT DETECTED Final   Streptococcus agalactiae NOT DETECTED NOT DETECTED Final   Streptococcus pneumoniae NOT DETECTED NOT DETECTED Final   Streptococcus pyogenes NOT DETECTED NOT DETECTED Final   A.calcoaceticus-baumannii NOT DETECTED NOT DETECTED Final   Bacteroides fragilis NOT DETECTED NOT DETECTED Final   Enterobacterales DETECTED (A) NOT DETECTED Final    Comment: Enterobacterales represent a large order of gram negative bacteria, not a single organism. REPEAT WITH NEW VIAL OF CTRL RN C. REYNOLDS [phone] '@[phone]'  FH    Enterobacter cloacae complex NOT DETECTED NOT DETECTED Final   Escherichia coli DETECTED (A) NOT DETECTED Final    Comment: CRITICAL RESULT CALLED TO, READ BACK BY AND VERIFIED WITH: RN C. Doy Mince [phone] '@[phone]'  FH    Klebsiella aerogenes NOT DETECTED NOT DETECTED  Final   Klebsiella oxytoca NOT DETECTED NOT DETECTED Final   Klebsiella pneumoniae NOT DETECTED NOT DETECTED Final   Proteus species NOT DETECTED NOT DETECTED Final   Salmonella species NOT DETECTED NOT DETECTED Final   Serratia marcescens NOT DETECTED NOT DETECTED Final   Haemophilus influenzae NOT DETECTED NOT DETECTED Final   Neisseria meningitidis NOT  DETECTED NOT DETECTED Final   Pseudomonas aeruginosa NOT DETECTED NOT DETECTED Final   Stenotrophomonas maltophilia NOT DETECTED NOT DETECTED Final   Candida albicans NOT DETECTED NOT DETECTED Final   Candida auris NOT DETECTED NOT DETECTED Final   Candida glabrata NOT DETECTED NOT DETECTED Final   Candida krusei NOT DETECTED NOT DETECTED Final   Candida parapsilosis NOT DETECTED NOT DETECTED Final   Candida tropicalis NOT DETECTED NOT DETECTED Final   Cryptococcus neoformans/gattii NOT DETECTED NOT DETECTED Final   CTX-M ESBL NOT DETECTED NOT DETECTED Final   Carbapenem resistance IMP NOT DETECTED NOT DETECTED Final   Carbapenem resistance KPC NOT DETECTED NOT DETECTED Final   Carbapenem resistance NDM NOT DETECTED NOT DETECTED Final   Carbapenem resist OXA 48 LIKE NOT DETECTED NOT DETECTED Final   Carbapenem resistance VIM NOT DETECTED NOT DETECTED Final    Comment: Performed at [facility], [address], [place], [place] [address]  Culture, blood (Routine x 2)     Status: None (Preliminary result)   Collection Time: 03/24/22  6:34 PM   Specimen: BLOOD RIGHT HAND  Result Value Ref Range Status   Specimen Description BLOOD RIGHT HAND  Final   Special Requests   Final    BOTTLES DRAWN AEROBIC AND ANAEROBIC Blood Culture adequate volume   Culture   Final    NO GROWTH 3 DAYS Performed at [facility], [address] [place], [place] [address]    Report Status PENDING  Incomplete      Radiology Studies: No results found.  Scheduled Meds:  apixaban  5 mg Oral BID   feeding supplement  237 mL Oral BID BM    multivitamin with minerals  1 tablet Oral Daily   Continuous Infusions:  sodium chloride 75 mL/hr at 03/27/22 0132   cefTRIAXone (ROCEPHIN)  IV Stopped (03/26/22 1802)     LOS: 3 days   Darliss Cheney, MD [facility]  03/27/2022, 12:10 PM   *Please note that this is a verbal dictation therefore any spelling or grammatical errors are due to the "[employer] One" system interpretation.  Please page via [place] and do not message via secure chat for urgent patient care matters. Secure chat can be used for non urgent patient care matters.  How to contact the [facility] Attending or Consulting provider [place] or covering provider during after hours [place], for this patient?  Check the care team in [facility] and look for a) attending/consulting TR[facility] provider listed and b) the [facility] team listed. Page or secure chat [phone]. Log into [email or web address] and use [place]'s universal password to access. If you do not have the password, please contact the hospital operator. Locate the [facility] provider you are looking for under [facility] and page to a number that you can be directly reached. If you still have difficulty reaching the provider, please page the [facility] (Director on Call) for the Hospitalists listed on amion for assistance.

## 2022-03-28 LAB — CBC WITH DIFFERENTIAL/PLATELET
Abs Immature Granulocytes: 0.03 10*3/uL (ref 0.00–0.07)
Basophils Absolute: 0.1 10*3/uL (ref 0.0–0.1)
Basophils Relative: 1 %
Eosinophils Absolute: 0.3 10*3/uL (ref 0.0–0.5)
Eosinophils Relative: 5 %
HCT: 34.4 % — ABNORMAL LOW (ref 39.0–52.0)
Hemoglobin: 11.4 g/dL — ABNORMAL LOW (ref 13.0–17.0)
Immature Granulocytes: 0 %
Lymphocytes Relative: 13 %
Lymphs Abs: 0.9 10*3/uL (ref 0.7–4.0)
MCH: 28.8 pg (ref 26.0–34.0)
MCHC: 33.1 g/dL (ref 30.0–36.0)
MCV: 86.9 fL (ref 80.0–100.0)
Monocytes Absolute: 0.7 10*3/uL (ref 0.1–1.0)
Monocytes Relative: 10 %
Neutro Abs: 4.9 10*3/uL (ref 1.7–7.7)
Neutrophils Relative %: 71 %
Platelets: 270 10*3/uL (ref 150–400)
RBC: 3.96 MIL/uL — ABNORMAL LOW (ref 4.22–5.81)
RDW: 16.3 % — ABNORMAL HIGH (ref 11.5–15.5)
WBC: 6.8 10*3/uL (ref 4.0–10.5)
nRBC: 0 % (ref 0.0–0.2)

## 2022-03-28 MED ORDER — CEPHALEXIN 500 MG PO CAPS
500.0000 mg | ORAL_CAPSULE | Freq: Three times a day (TID) | ORAL | 0 refills | Status: AC
Start: 2022-03-28 — End: 2022-04-01

## 2022-03-28 NOTE — Progress Notes (Signed)
Patient discharged home today, transported home today by family. Discharge paperwork went over with patient, patient verbalized understanding. Belongings sent home with patient.

## 2022-03-28 NOTE — TOC Transition Note (Signed)
Transition of Care [facility]) - CM/SW Discharge Note   Patient Details  Name: Joe [name] MRN: [id number] Date of Birth: 07-18-1956  Transition of Care [facility]) CM/SW Contact:  Salome Arnt, LCSW Phone Number: 03/28/2022, 9:11 AM   Clinical Narrative: Pt d/c today. MD ordered home health. Notified MD outpatient PT set up. Per MD, okay to cancel home health orders. Pt reports he has transportation for d/c today. Reminded pt about outpatient PT and he is agreeable.       Final next level of care: OP Rehab Barriers to Discharge: Barriers Resolved   Patient Goals and CMS Choice Patient states their goals for this hospitalization and ongoing recovery are:: return home   Choice offered to / list presented to : Patient  Discharge Placement                  Name of family member notified: pt only Patient and family notified of of transfer: 03/28/22  Discharge Plan and Services In-house Referral: Clinical Social Work                                   Social Determinants of Health ([place]) Interventions     Readmission Risk Interventions    03/25/2022   11:26 AM 08/23/2021    2:21 PM  Readmission Risk Prevention Plan  Transportation Screening Complete Complete  PCP or Specialist Appt within 3-5 Days  Complete  HRI or Home Care Consult Complete Complete  Social Work Consult for [place] Planning/Counseling Complete Complete  Palliative Care Screening Not Applicable Not Applicable  Medication Review Press photographer) Complete Complete

## 2022-03-28 NOTE — Discharge Summary (Addendum)
[facility] Discharge Summary  Joe [name] HAL:[id number] DOB: November 12, 1955 DOA: 03/24/2022  PCP: Celene Squibb, MD  Admit date: 03/24/2022 Discharge date: 03/28/2022 30 Day Unplanned Readmission Risk Score    Flowsheet Row ED to Hosp-Admission (Current) from 03/24/2022 in [facility]  30 Day Unplanned Readmission Risk Score (%) 24.39 Filed at 03/28/2022 0801       This score is the patient's risk of an unplanned readmission within 30 days of being discharged (0 -100%). The score is based on dignosis, age, lab data, medications, orders, and past utilization.   Low:  0-14.9   Medium: 15-21.9   High: 22-29.9   Extreme: 30 and above          Admitted From: Home Disposition: Home  Recommendations for Outpatient Follow-up:  Follow up with PCP in 1-2 weeks Please obtain BMP/CBC in one week Please follow up with your PCP on the following pending results: Unresulted Labs (From admission, onward)     Start     Ordered   03/26/22 0820  CBC with Differential/Platelet  Daily,   R     Question:  Specimen collection method  Answer:  Lab=Lab collect   03/26/22 0819              Home Health: Yes Equipment/Devices: None Addendum: I was informed by TOC that patient is set up for outpatient PT.  He has Medicaid and it will be difficult for him to get covered for home health PT. Discharge Condition: Stable CODE STATUS: Full code Diet recommendation: Cardiac  Subjective: Seen and examined.  No complaints.  Feels better.  Wants to go home today.  Brief/Interim Summary: HPI: Joe [name] is a 66 y.o. 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    In  the emergency department, he was febrile with a temperature of 102.52F, tachypneic and tachycardic.  BP was soft at 104/74.  Work-up in the ED showed normal CBC except for leukocytosis, BMP showed hyponatremia, hyperglycemia, BUN/creatinine 18/1.49 (baseline creatinine at 0.9-1.1).  Blood culture pending. Chest x-ray showed no active disease he was started on IV ceftriaxone and admitted under hospitalist service with following diagnosis.   Sepsis secondary to UTI/gram-negative bacteremia: Patient met sepsis criteria on admission due to being tachycardic, tachypneic, febrile and having leukocytosis.  Source of infection was UTI.  Blood cultures growing E. Coli but interestingly, urine was never sent for the culture.  Patient has improved significantly on Rocephin, has remained afebrile for more than 2 days now.  For some reason, sensitivities are still pending.  I discussed the case with on-call ID and due to the fact that patient has defervesced and improved significantly on Rocephin, they recommended Keflex 500 mg 3 times daily to complete 7-day course.   Acute hyponatremia: Resolved   Hypokalemia: Resolved   Acute kidney injury: Likely secondary to sepsis, resolved.    Essential hypertension: During this whole hospitalization, patient's blood pressure remained on low normal side despite of holding his amlodipine so based on that data, we are discontinuing his amlodipine at discharge.  Left leg DVT: Continue Eliquis.   Obesity class III: Diet and lifestyle modification recommended and weight loss recommended as well.  Discharge plan was  discussed with patient and/or family member and they verbalized understanding and agreed with it.  Discharge Diagnoses:  Principal Problem:   Sepsis secondary to UTI [facility]) Active Problems:   Essential hypertension   Obesity, Class III, BMI 40-49.9 (morbid obesity) (HCC)   Leukocytosis   Hyponatremia   AKI (acute kidney injury) ([place])   Left leg DVT ([place])    E coli bacteremia    Discharge Instructions  Discharge Instructions     Ambulatory referral to Physical Therapy   Complete by: As directed       Allergies as of 03/28/2022   No Known Allergies      Medication List     STOP taking these medications    amLODipine 10 MG tablet Commonly known as: NORVASC   sodium bicarbonate/sodium chloride Soln       TAKE these medications    acetaminophen 325 MG tablet Commonly known as: TYLENOL Take 2 tablets (650 mg total) by mouth every 6 (six) hours as needed for mild pain (or Fever >/= 101).   albuterol 108 (90 Base) MCG/ACT inhaler Commonly known as: VENTOLIN HFA Inhale 2 puffs into the lungs every 6 (six) hours as needed for wheezing or shortness of breath.   allopurinol 300 MG tablet Commonly known as: ZYLOPRIM TAKE 1 TABLET BY MOUTH EVERY DAY   ALPRAZolam 1 MG tablet Commonly known as: XANAX Take 1 mg by mouth 3 (three) times daily as needed.   cephALEXin 500 MG capsule Commonly known as: KEFLEX Take 1 capsule (500 mg total) by mouth 3 (three) times daily for 4 days.   Eliquis 5 MG Tabs tablet Generic drug: apixaban TAKE 1 TABLET BY MOUTH TWICE DAILY, this replaces THE lovenox injections What changed: See the new instructions.   feeding supplement Liqd Take 237 mLs by mouth 2 (two) times daily between meals.   HYDROcodone-acetaminophen 7.5-325 mg/15 ml solution Commonly known as: HYCET Take 10 mLs by mouth 4 (four) times daily as needed for moderate pain.   lidocaine 2 % solution Commonly known as: XYLOCAINE Patient: Mix 1part 2% viscous lidocaine, 1part H20. Swallow 31m of diluted mixture, 360m before meals and at bedtime, up to QID   lidocaine-prilocaine cream Commonly known as: EMLA Apply 1 application topically as needed. 30-60 mins prior to port a cath access. Start using 2 weeks after port a cath placement.   ondansetron 8 MG tablet Commonly known as: Zofran Take 1 tablet (8 mg total) by mouth 2  (two) times daily as needed for refractory nausea / vomiting. Start on day 3 after cyclophosphamide chemotherapy.   ONE-A-DAY MENS PO Take by mouth.   predniSONE 20 MG tablet Commonly known as: DELTASONE Take 3 tablets (60 mg total) by mouth daily. Take with food on days 2-6 of chemotherapy.   prochlorperazine 10 MG tablet Commonly known as: COMPAZINE Take 1 tablet (10 mg total) by mouth every 6 (six) hours as needed (Nausea or vomiting).   sertraline 50 MG tablet Commonly known as: ZOLOFT Take 50 mg by mouth daily.   sodium chloride flush 0.9 % Soln Commonly known as: NS 10-40 mLs by Intracatheter route as needed (flush).   Vitamin D (Ergocalciferol) 1.25 MG (50000 UNIT) Caps capsule Commonly known as: DRISDOL TAKE ONE CAPSULE BY MOUTH ONCE a WEEK        Follow-up Information     HaCelene SquibbMD Follow up in 1 week(s).   Specialty: Internal Medicine Contact information: [id number]  [phone]                No Known Allergies  Consultations: ID curb sided   Procedures/Studies: DG Chest Port 1 View  Result Date: 03/24/2022 CLINICAL DATA:  Possible sepsis. History of throat cancer, currently on chemotherapy. EXAM: PORTABLE CHEST 1 VIEW COMPARISON:  10/13/2021 prior studies FINDINGS: The cardiomediastinal silhouette is unremarkable. RIGHT IJ Port-A-Cath again noted with tip overlying the mid SVC. There is no evidence of focal airspace disease, pulmonary edema, suspicious pulmonary nodule/mass, pleural effusion, or pneumothorax. No acute bony abnormalities are identified. IMPRESSION: No active disease. Electronically Signed   By: Margarette Canada M.D.   On: 03/24/2022 18:24   SLEEP STUDY DOCUMENTS  Result Date: 03/07/2022 Ordered by an unspecified provider.    Discharge Exam: Vitals:   03/27/22 2115 03/28/22 0527  BP: (!) 110/56 107/68  Pulse: 72 89  Resp: 20 19  Temp: 98.2 F (36.8 C) 98.1 F (36.7 C)  SpO2: 95% 92%    Vitals:   03/27/22 0501 03/27/22 1528 03/27/22 2115 03/28/22 0527  BP: (!) 102/56 110/67 (!) 110/56 107/68  Pulse: 83 83 72 89  Resp: _0 Temp: 99.2 F (37.3 C) (!) 97.5 F (36.4 C) 98.2 F (36.8 C) 98.1 F (36.7 C)  TempSrc: Oral Oral Oral Oral  SpO2: 92% 97% 95% 92%  Weight:      Height:        General: Pt is alert, awake, not in acute distress Cardiovascular: RRR, S1/S2 +, no rubs, no gallops Respiratory: CTA bilaterally, no wheezing, no rhonchi Abdominal: Soft, NT, ND, bowel sounds + Extremities: no edema, no cyanosis    The results of significant diagnostics from this hospitalization (including imaging, microbiology, ancillary and laboratory) are listed below for reference.     Microbiology: Recent Results (from the past 240 hour(s))  Culture, blood (Routine x 2)     Status: Abnormal (Preliminary result)   Collection Time: 03/24/22  6:23 PM   Specimen: Right Antecubital; Blood  Result Value Ref Range Status   Specimen Description   Final    RIGHT ANTECUBITAL Performed at [facility], [address] [place], [place] [address]    Special Requests   Final    BOTTLES DRAWN AEROBIC AND ANAEROBIC Blood Culture results may not be optimal due to an excessive volume of blood received in culture bottles Performed at [facility], [address], [place], [place] [address]    Culture  Setup Time   Final    GRAM NEGATIVE RODS Gram Stain Report Called to,Read Back By and Verified With: C.REYNOLDS @ 1300 BY STEPHTR ANAEROBIC BOTTLE ONLY CRITICAL RESULT CALLED TO, READ BACK BY AND VERIFIED WITH: RN CDoy Mince [phone] _1  FH    Culture (A)  Final    ESCHERICHIA COLI SUSCEPTIBILITIES TO FOLLOW Performed at [facility], [place] [address], [place], [place] [address]    Report Status PENDING  Incomplete  Blood Culture ID Panel (Reflexed)     Status: Abnormal   Collection Time: 03/24/22  6:23 PM  Result Value Ref Range Status   Enterococcus faecalis NOT  DETECTED NOT DETECTED Final   Enterococcus Faecium NOT DETECTED NOT DETECTED Final   Listeria monocytogenes NOT DETECTED NOT DETECTED Final   Staphylococcus species NOT DETECTED NOT DETECTED Final   Staphylococcus aureus (BCID) NOT DETECTED NOT DETECTED Final   Staphylococcus epidermidis NOT DETECTED NOT DETECTED Final   Staphylococcus lugdunensis NOT DETECTED NOT DETECTED Final  Streptococcus species NOT DETECTED NOT DETECTED Final   Streptococcus agalactiae NOT DETECTED NOT DETECTED Final   Streptococcus pneumoniae NOT DETECTED NOT DETECTED Final   Streptococcus pyogenes NOT DETECTED NOT DETECTED Final   A.calcoaceticus-baumannii NOT DETECTED NOT DETECTED Final   Bacteroides fragilis NOT DETECTED NOT DETECTED Final   Enterobacterales DETECTED (A) NOT DETECTED Final    Comment: Enterobacterales represent a large order of gram negative bacteria, not a single organism. REPEAT WITH NEW VIAL OF CTRL RN C. REYNOLDS [phone] _0  FH    Enterobacter cloacae complex NOT DETECTED NOT DETECTED Final   Escherichia coli DETECTED (A) NOT DETECTED Final    Comment: CRITICAL RESULT CALLED TO, READ BACK BY AND VERIFIED WITH: RN C. Doy Mince [phone] _1  FH    Klebsiella aerogenes NOT DETECTED NOT DETECTED Final   Klebsiella oxytoca NOT DETECTED NOT DETECTED Final   Klebsiella pneumoniae NOT DETECTED NOT DETECTED Final   Proteus species NOT DETECTED NOT DETECTED Final   Salmonella species NOT DETECTED NOT DETECTED Final   Serratia marcescens NOT DETECTED NOT DETECTED Final   Haemophilus influenzae NOT DETECTED NOT DETECTED Final   Neisseria meningitidis NOT DETECTED NOT DETECTED Final   Pseudomonas aeruginosa NOT DETECTED NOT DETECTED Final   Stenotrophomonas maltophilia NOT DETECTED NOT DETECTED Final   Candida albicans NOT DETECTED NOT DETECTED Final   Candida auris NOT DETECTED NOT DETECTED Final   Candida glabrata NOT DETECTED NOT DETECTED Final   Candida krusei NOT DETECTED NOT DETECTED Final    Candida parapsilosis NOT DETECTED NOT DETECTED Final   Candida tropicalis NOT DETECTED NOT DETECTED Final   Cryptococcus neoformans/gattii NOT DETECTED NOT DETECTED Final   CTX-M ESBL NOT DETECTED NOT DETECTED Final   Carbapenem resistance IMP NOT DETECTED NOT DETECTED Final   Carbapenem resistance KPC NOT DETECTED NOT DETECTED Final   Carbapenem resistance NDM NOT DETECTED NOT DETECTED Final   Carbapenem resist OXA 48 LIKE NOT DETECTED NOT DETECTED Final   Carbapenem resistance VIM NOT DETECTED NOT DETECTED Final    Comment: Performed at [facility], [address], [place], [place] [address]  Culture, blood (Routine x 2)     Status: None (Preliminary result)   Collection Time: 03/24/22  6:34 PM   Specimen: BLOOD RIGHT HAND  Result Value Ref Range Status   Specimen Description BLOOD RIGHT HAND  Final   Special Requests   Final    BOTTLES DRAWN AEROBIC AND ANAEROBIC Blood Culture adequate volume   Culture   Final    NO GROWTH 4 DAYS Performed at [facility], [address] [place], [place] [address]    Report Status PENDING  Incomplete     Labs: BNP (last 3 results) Recent Labs    08/15/21 1354  BNP 12.4   Basic Metabolic Panel: Recent Labs  Lab 03/24/22 1823 03/25/22 0419 03/26/22 0826 03/27/22 0442  NA 131* 135 133* 136  K 4.0 4.5 3.4* 3.8  CL 99 99 102 104  CO2 _2 GLUCOSE 122* 111* 117* 97  BUN _3 CREATININE 1.49* 1.52* 1.30* 1.02  CALCIUM 8.9 8.9 8.1* 8.5*  MG  --  1.8  --  1.8  PHOS  --  3.5  --   --    Liver Function Tests: Recent Labs  Lab 03/24/22 1823 03/25/22 0419  AST 29 24  ALT 44 36  ALKPHOS 75 72  BILITOT 1.2 1.2  PROT 7.4 6.8  ALBUMIN 3.5 3.3*  No results for input(s): "LIPASE", "AMYLASE" in the last 168 hours. No results for input(s): "AMMONIA" in the last 168 hours. CBC: Recent Labs  Lab 03/24/22 1823 03/25/22 0419 03/26/22 0826 03/27/22 0442 03/28/22 0458  WBC 16.1* 14.8* 8.7 6.7 6.8  NEUTROABS  13.0*  --  6.7 4.9 4.9  HGB 13.1 12.4* 10.9* 10.6* 11.4*  HCT 38.4* 37.6* 32.0* 32.6* 34.4*  MCV 85.5 88.1 86.5 87.6 86.9  PLT 235 212 193 246 270   Cardiac Enzymes: No results for input(s): "CKTOTAL", "CKMB", "CKMBINDEX", "TROPONINI" in the last 168 hours. BNP: Invalid input(s): "POCBNP" CBG: No results for input(s): "GLUCAP" in the last 168 hours. D-Dimer No results for input(s): "DDIMER" in the last 72 hours. Hgb A1c No results for input(s): "HGBA1C" in the last 72 hours. Lipid Profile No results for input(s): "CHOL", "HDL", "LDLCALC", "TRIG", "CHOLHDL", "LDLDIRECT" in the last 72 hours. Thyroid function studies No results for input(s): "TSH", "T4TOTAL", "T3FREE", "THYROIDAB" in the last 72 hours.  Invalid input(s): "FREET3" Anemia work up No results for input(s): "VITAMINB12", "FOLATE", "FERRITIN", "TIBC", "IRON", "RETICCTPCT" in the last 72 hours. Urinalysis    Component Value Date/Time   COLORURINE AMBER (A) 03/24/2022 1748   APPEARANCEUR HAZY (A) 03/24/2022 1748   LABSPEC 1.013 03/24/2022 1748   PHURINE 7.0 03/24/2022 1748   GLUCOSEU NEGATIVE 03/24/2022 1748   HGBUR SMALL (A) 03/24/2022 1748   BILIRUBINUR NEGATIVE 03/24/2022 1748   [place] 03/24/2022 1748   PROTEINUR 100 (A) 03/24/2022 1748   NITRITE NEGATIVE 03/24/2022 1748   LEUKOCYTESUR TRACE (A) 03/24/2022 1748   Sepsis Labs Recent Labs  Lab 03/25/22 0419 03/26/22 0826 03/27/22 0442 03/28/22 0458  WBC 14.8* 8.7 6.7 6.8   Microbiology Recent Results (from the past 240 hour(s))  Culture, blood (Routine x 2)     Status: Abnormal (Preliminary result)   Collection Time: 03/24/22  6:23 PM   Specimen: Right Antecubital; Blood  Result Value Ref Range Status   Specimen Description   Final    RIGHT ANTECUBITAL Performed at [facility], [address], [place], [place] [address]    Special Requests   Final    BOTTLES DRAWN AEROBIC AND ANAEROBIC Blood Culture results may not be optimal due to an  excessive volume of blood received in culture bottles Performed at [facility], [address] [place], [place] [address]    Culture  Setup Time   Final    GRAM NEGATIVE RODS Gram Stain Report Called to,Read Back By and Verified With: C.REYNOLDS @ 1300 BY STEPHTR ANAEROBIC BOTTLE ONLY CRITICAL RESULT CALLED TO, READ BACK BY AND VERIFIED WITH: RN CDoy Mince [phone] _0  FH    Culture (A)  Final    ESCHERICHIA COLI SUSCEPTIBILITIES TO FOLLOW Performed at [facility], [place] [address] [place], [place] [address]    Report Status PENDING  Incomplete  Blood Culture ID Panel (Reflexed)     Status: Abnormal   Collection Time: 03/24/22  6:23 PM  Result Value Ref Range Status   Enterococcus faecalis NOT DETECTED NOT DETECTED Final   Enterococcus Faecium NOT DETECTED NOT DETECTED Final   Listeria monocytogenes NOT DETECTED NOT DETECTED Final   Staphylococcus species NOT DETECTED NOT DETECTED Final   Staphylococcus aureus (BCID) NOT DETECTED NOT DETECTED Final   Staphylococcus epidermidis NOT DETECTED NOT DETECTED Final   Staphylococcus lugdunensis NOT DETECTED NOT DETECTED Final   Streptococcus species NOT DETECTED NOT DETECTED Final   Streptococcus agalactiae NOT DETECTED NOT DETECTED Final  Streptococcus pneumoniae NOT DETECTED NOT DETECTED Final   Streptococcus pyogenes NOT DETECTED NOT DETECTED Final   A.calcoaceticus-baumannii NOT DETECTED NOT DETECTED Final   Bacteroides fragilis NOT DETECTED NOT DETECTED Final   Enterobacterales DETECTED (A) NOT DETECTED Final    Comment: Enterobacterales represent a large order of gram negative bacteria, not a single organism. REPEAT WITH NEW VIAL OF CTRL RN C. REYNOLDS [phone] _0  FH    Enterobacter cloacae complex NOT DETECTED NOT DETECTED Final   Escherichia coli DETECTED (A) NOT DETECTED Final    Comment: CRITICAL RESULT CALLED TO, READ BACK BY AND VERIFIED WITH: RN C. Doy Mince [phone] _1  FH    Klebsiella aerogenes NOT DETECTED  NOT DETECTED Final   Klebsiella oxytoca NOT DETECTED NOT DETECTED Final   Klebsiella pneumoniae NOT DETECTED NOT DETECTED Final   Proteus species NOT DETECTED NOT DETECTED Final   Salmonella species NOT DETECTED NOT DETECTED Final   Serratia marcescens NOT DETECTED NOT DETECTED Final   Haemophilus influenzae NOT DETECTED NOT DETECTED Final   Neisseria meningitidis NOT DETECTED NOT DETECTED Final   Pseudomonas aeruginosa NOT DETECTED NOT DETECTED Final   Stenotrophomonas maltophilia NOT DETECTED NOT DETECTED Final   Candida albicans NOT DETECTED NOT DETECTED Final   Candida auris NOT DETECTED NOT DETECTED Final   Candida glabrata NOT DETECTED NOT DETECTED Final   Candida krusei NOT DETECTED NOT DETECTED Final   Candida parapsilosis NOT DETECTED NOT DETECTED Final   Candida tropicalis NOT DETECTED NOT DETECTED Final   Cryptococcus neoformans/gattii NOT DETECTED NOT DETECTED Final   CTX-M ESBL NOT DETECTED NOT DETECTED Final   Carbapenem resistance IMP NOT DETECTED NOT DETECTED Final   Carbapenem resistance KPC NOT DETECTED NOT DETECTED Final   Carbapenem resistance NDM NOT DETECTED NOT DETECTED Final   Carbapenem resist OXA 48 LIKE NOT DETECTED NOT DETECTED Final   Carbapenem resistance VIM NOT DETECTED NOT DETECTED Final    Comment: Performed at [facility], [address] [place], [place] [address]  Culture, blood (Routine x 2)     Status: None (Preliminary result)   Collection Time: 03/24/22  6:34 PM   Specimen: BLOOD RIGHT HAND  Result Value Ref Range Status   Specimen Description BLOOD RIGHT HAND  Final   Special Requests   Final    BOTTLES DRAWN AEROBIC AND ANAEROBIC Blood Culture adequate volume   Culture   Final    NO GROWTH 4 DAYS Performed at [facility], [address], [place], [place] [address]    Report Status PENDING  Incomplete     Time coordinating discharge: Over 30 minutes  SIGNED:   Darliss Cheney, MD  [facility] 03/28/2022, 9:03 AM *Please  note that this is a verbal dictation therefore any spelling or grammatical errors are due to the "[place] [employer]" system interpretation. If 7PM-7AM, please contact night-coverage [email or web address]

## 2022-03-29 LAB — CULTURE, BLOOD (ROUTINE X 2)
Culture: NO GROWTH
Special Requests: ADEQUATE

## 2022-04-01 ENCOUNTER — Ambulatory Visit: Payer: 59 | Admitting: Pulmonary Disease

## 2022-04-02 ENCOUNTER — Other Ambulatory Visit: Payer: Self-pay

## 2022-04-02 ENCOUNTER — Inpatient Hospital Stay: Payer: Medicaid Other | Admitting: Dietician

## 2022-04-02 ENCOUNTER — Inpatient Hospital Stay: Payer: Medicaid Other | Admitting: Hematology

## 2022-04-02 ENCOUNTER — Inpatient Hospital Stay: Payer: Medicaid Other | Attending: Hematology

## 2022-04-02 VITALS — BP 129/70 | HR 88 | Temp 97.3°F | Resp 18 | Wt 279.6 lb

## 2022-04-02 DIAGNOSIS — C8288 Other types of follicular lymphoma, lymph nodes of multiple sites: Secondary | ICD-10-CM | POA: Insufficient documentation

## 2022-04-02 DIAGNOSIS — Z79899 Other long term (current) drug therapy: Secondary | ICD-10-CM | POA: Diagnosis not present

## 2022-04-02 DIAGNOSIS — C8221 Follicular lymphoma grade III, unspecified, lymph nodes of head, face, and neck: Secondary | ICD-10-CM

## 2022-04-02 DIAGNOSIS — Z452 Encounter for adjustment and management of vascular access device: Secondary | ICD-10-CM

## 2022-04-02 DIAGNOSIS — Z7189 Other specified counseling: Secondary | ICD-10-CM

## 2022-04-02 LAB — CBC WITH DIFFERENTIAL (CANCER CENTER ONLY)
Abs Immature Granulocytes: 0.02 10*3/uL (ref 0.00–0.07)
Basophils Absolute: 0.1 10*3/uL (ref 0.0–0.1)
Basophils Relative: 1 %
Eosinophils Absolute: 0.2 10*3/uL (ref 0.0–0.5)
Eosinophils Relative: 4 %
HCT: 37.4 % — ABNORMAL LOW (ref 39.0–52.0)
Hemoglobin: 12.6 g/dL — ABNORMAL LOW (ref 13.0–17.0)
Immature Granulocytes: 0 %
Lymphocytes Relative: 21 %
Lymphs Abs: 1.2 10*3/uL (ref 0.7–4.0)
MCH: 29.2 pg (ref 26.0–34.0)
MCHC: 33.7 g/dL (ref 30.0–36.0)
MCV: 86.8 fL (ref 80.0–100.0)
Monocytes Absolute: 0.6 10*3/uL (ref 0.1–1.0)
Monocytes Relative: 9 %
Neutro Abs: 3.8 10*3/uL (ref 1.7–7.7)
Neutrophils Relative %: 65 %
Platelet Count: 368 10*3/uL (ref 150–400)
RBC: 4.31 MIL/uL (ref 4.22–5.81)
RDW: 16.4 % — ABNORMAL HIGH (ref 11.5–15.5)
WBC Count: 5.9 10*3/uL (ref 4.0–10.5)
nRBC: 0 % (ref 0.0–0.2)

## 2022-04-02 LAB — CMP (CANCER CENTER ONLY)
ALT: 42 U/L (ref 0–44)
AST: 22 U/L (ref 15–41)
Albumin: 3.9 g/dL (ref 3.5–5.0)
Alkaline Phosphatase: 75 U/L (ref 38–126)
Anion gap: 6 (ref 5–15)
BUN: 15 mg/dL (ref 8–23)
CO2: 30 mmol/L (ref 22–32)
Calcium: 9.6 mg/dL (ref 8.9–10.3)
Chloride: 103 mmol/L (ref 98–111)
Creatinine: 1.15 mg/dL (ref 0.61–1.24)
GFR, Estimated: >60 mL/min (ref >60)
Glucose, Bld: 99 mg/dL (ref 70–99)
Potassium: 4.2 mmol/L (ref 3.5–5.1)
Sodium: 139 mmol/L (ref 135–145)
Total Bilirubin: 0.5 mg/dL (ref 0.3–1.2)
Total Protein: 7.3 g/dL (ref 6.5–8.1)

## 2022-04-02 MED ORDER — SODIUM CHLORIDE 0.9% FLUSH
10.0000 mL | Freq: Once | INTRAVENOUS | Status: AC
  Administered 2022-04-02: 10 mL

## 2022-04-02 MED ORDER — HEPARIN SOD (PORK) LOCK FLUSH 100 UNIT/ML IV SOLN
500.0000 [IU] | Freq: Once | INTRAVENOUS | Status: AC
  Administered 2022-04-02: 500 [IU]

## 2022-04-02 NOTE — Progress Notes (Signed)
Nutrition Follow-up:  Patient with grade 3 follicular lymphoma. He completed 6 cycles of chemotherapy with R-CHOP followed by radiation therapy to bulky disease in neck. Final radiation therapy completed on 4/18.  -hospital admission 6/4-6/8 with sepsis secondary to UTI  Met with patient in office. He reports ongoing weakness from recent hospitalization. This is improving, reports completing antibiotics yesterday (6/12). He did not eat much during admission, states "they had some funky food over there" and "thankful for the Ensure and oatmeal, otherwise would not have eaten." Patient has been eating well since discharge, recalls 3 meals and 2 Boost. Patient reports taste continues to improve. He is having less thick saliva in the morning. Patient wishes his beard would hurry up and grow back.   Medications: reviewed   Labs: reviewed  Anthropometrics: Weight 279 lb 9.6 oz today decreased  6/5 - 282 lb 3 oz 5/5 - 288 lb 4 oz  3/27 - 313 lb   NUTRITION DIAGNOSIS: Unintentional weight loss ongoing    INTERVENTION:  Continue drinking Boost Plus/equivalent, recommend 3 daily for added calories and protein One complimentary case Ensure Plus High Protein provided  Patient has contact information     MONITORING, EVALUATION, GOAL: weight trends, intake    NEXT VISIT: No follow-up scheduled. Patient will contact with nutrition questions/concerns

## 2022-04-03 ENCOUNTER — Encounter: Payer: Self-pay | Admitting: Hematology

## 2022-04-03 ENCOUNTER — Other Ambulatory Visit: Payer: 59

## 2022-04-03 ENCOUNTER — Telehealth: Payer: Self-pay | Admitting: Hematology

## 2022-04-03 ENCOUNTER — Ambulatory Visit: Payer: 59 | Admitting: Hematology

## 2022-04-03 NOTE — Telephone Encounter (Signed)
Unable to leave message with follow-up appointment per 6/13 los. Mailed calendar.

## 2022-04-05 ENCOUNTER — Ambulatory Visit (INDEPENDENT_AMBULATORY_CARE_PROVIDER_SITE_OTHER): Payer: Medicaid Other | Admitting: Pulmonary Disease

## 2022-04-05 ENCOUNTER — Encounter: Payer: Self-pay | Admitting: Pulmonary Disease

## 2022-04-05 VITALS — BP 138/88 | HR 102 | Temp 98.0°F | Ht 69.0 in | Wt 280.2 lb

## 2022-04-05 DIAGNOSIS — Z9989 Dependence on other enabling machines and devices: Secondary | ICD-10-CM

## 2022-04-05 DIAGNOSIS — J398 Other specified diseases of upper respiratory tract: Secondary | ICD-10-CM

## 2022-04-05 DIAGNOSIS — G4730 Sleep apnea, unspecified: Secondary | ICD-10-CM

## 2022-04-05 DIAGNOSIS — G4733 Obstructive sleep apnea (adult) (pediatric): Secondary | ICD-10-CM

## 2022-04-05 DIAGNOSIS — E669 Obesity, unspecified: Secondary | ICD-10-CM

## 2022-04-05 NOTE — Patient Instructions (Signed)
Will arrange for Airfit F30 CPAP mask  Follow up in 1 year

## 2022-04-05 NOTE — Progress Notes (Signed)
[facility]  Chief Complaint  Patient presents with   Follow-up    Patient couldn't wear cpap after radiation for a while and had a bladder infection that stopped him from using for a while. Is now using again   Would like to change to mask: AIRFIT F30 SML SYS SLM     Past Surgical History:  He  has a past surgical history that includes carpel tunnel; Mass biopsy (Left, 07/14/2021); and IR IMAGING GUIDED PORT INSERTION (10/10/2021).  Past Medical History:  Anxiety, OA, COVID in 2020 and 2022, Depression, HTN, PE with Lt leg DVT October 8786, High grade follicular lymphoma  Constitutional:  BP 138/88 (BP Location: Left Arm, Patient Position: Sitting)   Pulse (!) 102   Temp 98 F (36.7 C) (Temporal)   Ht '5\' 9"'$  (1.753 m)   Wt 280 lb 3.2 oz (127.1 kg)   SpO2 98% Comment: ra  BMI 41.38 kg/m   Brief Summary:  Joe [name] is a 66 y.o. male former smoker with sleep disordered breathing.       Subjective:   He had trouble eating while getting radiation therapy.  He has lost 35 lbs since January.  He is hoping to maintain his current weight or go down some more.  His appetite is better now and no trouble swallowing.  He feels his breathing is better, and no longer needs to use his inhalers.  No longer needing supplemental oxygen.  He is doing well with CPAP.  He would like to change to a hybrid mask.   Physical Exam:   Appearance - well kempt   ENMT - no sinus tenderness, no oral exudate, no LAN, Mallampati 3 airway, no stridor  Respiratory - equal breath sounds bilaterally, no wheezing or rales  CV - s1s2 regular rate and rhythm, no murmurs  Ext - no clubbing, no edema  Skin - no rashes  Psych - normal mood and affect   Pulmonary testing:  ABG 08/22/21 >> pH 7.37, PCO2 61.4, PO2 97.6 PFT 09/04/21 >> FEV1 2.91 (85%), FEV1% 89, TLC 5.79 (85%), DLCO 82%  Chest Imaging:  CT angio chest 10/13/21 >> scattered bronchial wall  thickening, mild mosaic attentuation, 6 mm LLL GGO nodule, 7 mm RLL GGO nodule, fatty liver, gynecomastia, lunate configuration of trachea  Sleep Tests:  HST 09/07/21 >> AHI 68.5, SpO2 low 39% CPAP 01/07/22 >> CPAP 8 cm H2O >> AHI 6.9, centrals with higher pressure. CPAP 03/04/22 to 03/17/22 >> used on 14 of 14 nights with average 7 hrs 21 min.  Average AHI 2.9 with CPAP 8 cm H2O  Cardiac Tests:  Echo 08/01/21 >> EF 60 to 65%  Social History:  He  reports that he quit smoking about 35 years ago. His smoking use included cigarettes. He smoked an average of .25 packs per day. He quit smokeless tobacco use about 34 years ago.  His smokeless tobacco use included chew. He reports current alcohol use. He reports that he does not use drugs.  Family History:  His family history includes Healthy in his father and mother; Throat cancer in his brother.     Assessment/Plan:   Sleep disordered breathing. - related to obstructive sleep apnea, tracheobronchomalacia and obesity hypoventilation syndrome - has improved considerable since he has lost weight, and no longer needing supplemental oxygen - he is compliant with CPAP and reports benefit from therapy - he uses [employer] for his DME - his current CPAP was ordered  on 10/24/21 - continue CPAP 8 cm H2O - will send order to change his CPAP mask  High grade follicular lymphoma - followed by Dr. Irene Limbo with Hematology/Oncology  Pulmonary embolism with Lt leg DVT from October 2022. - remains on eliquis - followed by hematology  Time Spent Involved in Patient Care on Day of Examination:  28 minutes  Follow up:   Patient Instructions  Will arrange for Airfit F30 CPAP mask  Follow up in 1 year  Medication List:   Allergies as of 04/05/2022   No Known Allergies      Medication List        Accurate as of April 05, 2022  9:11 AM. If you have any questions, ask your nurse or doctor.          STOP taking these medications    predniSONE 20  MG tablet Commonly known as: DELTASONE Stopped by: Chesley Mires, MD   prochlorperazine 10 MG tablet Commonly known as: COMPAZINE Stopped by: Chesley Mires, MD       TAKE these medications    acetaminophen 325 MG tablet Commonly known as: TYLENOL Take 2 tablets (650 mg total) by mouth every 6 (six) hours as needed for mild pain (or Fever >/= 101).   albuterol 108 (90 Base) MCG/ACT inhaler Commonly known as: VENTOLIN HFA Inhale 2 puffs into the lungs every 6 (six) hours as needed for wheezing or shortness of breath.   allopurinol 300 MG tablet Commonly known as: ZYLOPRIM TAKE 1 TABLET BY MOUTH EVERY DAY   ALPRAZolam 1 MG tablet Commonly known as: XANAX Take 1 mg by mouth 3 (three) times daily as needed.   Eliquis 5 MG Tabs tablet Generic drug: apixaban TAKE 1 TABLET BY MOUTH TWICE DAILY, this replaces THE lovenox injections What changed: See the new instructions.   feeding supplement Liqd Take 237 mLs by mouth 2 (two) times daily between meals.   HYDROcodone-acetaminophen 7.5-325 mg/15 ml solution Commonly known as: HYCET Take 10 mLs by mouth 4 (four) times daily as needed for moderate pain.   lidocaine 2 % solution Commonly known as: XYLOCAINE Patient: Mix 1part 2% viscous lidocaine, 1part H20. Swallow 9m of diluted mixture, 361m before meals and at bedtime, up to QID   lidocaine-prilocaine cream Commonly known as: EMLA Apply 1 application topically as needed. 30-60 mins prior to port a cath access. Start using 2 weeks after port a cath placement.   ondansetron 8 MG tablet Commonly known as: Zofran Take 1 tablet (8 mg total) by mouth 2 (two) times daily as needed for refractory nausea / vomiting. Start on day 3 after cyclophosphamide chemotherapy.   ONE-A-DAY MENS PO Take by mouth.   sertraline 50 MG tablet Commonly known as: ZOLOFT Take 50 mg by mouth daily.   sodium chloride flush 0.9 % Soln Commonly known as: NS 10-40 mLs by Intracatheter route as  needed (flush).   Vitamin D (Ergocalciferol) 1.25 MG (50000 UNIT) Caps capsule Commonly known as: DRISDOL TAKE ONE CAPSULE BY MOUTH ONCE a WEEK        Signature:  ViChesley MiresMD LeB[place]ager - [phone]/16/2023, 9:11 AM

## 2022-04-08 ENCOUNTER — Encounter: Payer: Self-pay | Admitting: Hematology

## 2022-04-08 NOTE — Progress Notes (Signed)
HEMATOLOGY/ONCOLOGY CLINIC NOTE  Date of Service: 04/02/2022   Patient Care Team: Celene Squibb, MD as PCP - General (Internal Medicine) Malmfelt, Stephani Police, RN as Oncology Nurse Navigator Eppie Gibson, MD as Consulting Physician (Radiation Oncology) Brunetta Genera, MD as Consulting Physician (Hematology)  CHIEF COMPLAINTS/PURPOSE OF CONSULTATION:   Follow-up for continued evaluation and management of high-grade follicular lymphoma  HISTORY OF PRESENTING ILLNESS:  Please see previous notes for details on initial presentation  INTERVAL HISTORY  [name] is here for continued valuation and management of high-grade follicular lymphoma . He has completed his involved site radiation therapy which he notes felt more difficult to tolerate than his chemoimmunotherapy.[name] He describes grade 2 radiation mucositis which is now significantly improved. No fevers no chills no night sweats. No new lumps or bumps. Improving appetite. Labs done today were reviewed in detail with the patient.  MEDICAL HISTORY:  Past Medical History:  Diagnosis Date   Anxiety    Arthritis    COVID    x 2 (2020 and 2022)   Depression    Follicular lymphoma ([place])    Hypertension    Left leg DVT ([place]) 07/2021   Pulmonary embolism ([place]) 07/2021    SURGICAL HISTORY: Past Surgical History:  Procedure Laterality Date   carpel tunnel     IR IMAGING GUIDED PORT INSERTION  10/10/2021   MASS BIOPSY Left 07/14/2021   Procedure: NECK MASS BIOPSY;  Surgeon: Melida Quitter, MD;  Location: [facility] OR;  Service: ENT;  Laterality: Left;    SOCIAL HISTORY: Social History   Socioeconomic History   Marital status: Widowed    Spouse name: Not on file   Number of children: Not on file   Years of education: Not on file   Highest education level: Not on file  Occupational History   Not on file  Tobacco Use   Smoking status: Former    Packs/day: 0.25    Types: Cigarettes    Quit date: 11/01/1986     Years since quitting: 35.4   Smokeless tobacco: Former    Types: Chew    Quit date: 11/28/1987  Vaping Use   Vaping Use: Never used  Substance and Sexual Activity   Alcohol use: Yes    Comment: rare   Drug use: No   Sexual activity: Not Currently  Other Topics Concern   Not on file  Social History Narrative   Not on file   Social Determinants of Health   Financial Resource Strain: Not on file  Food Insecurity: Not on file  Transportation Needs: Not on file  Physical Activity: Not on file  Stress: Not on file  Social Connections: Not on file  Intimate Partner Violence: Not on file    FAMILY HISTORY: Family History  Problem Relation Age of Onset   Healthy Mother    Healthy Father    Throat cancer Brother     ALLERGIES:  has No Known Allergies.  MEDICATIONS:  Current Outpatient Medications  Medication Sig Dispense Refill   acetaminophen (TYLENOL) 325 MG tablet Take 2 tablets (650 mg total) by mouth every 6 (six) hours as needed for mild pain (or Fever >/= 101). 20 tablet 0   albuterol (VENTOLIN HFA) 108 (90 Base) MCG/ACT inhaler Inhale 2 puffs into the lungs every 6 (six) hours as needed for wheezing or shortness of breath.     allopurinol (ZYLOPRIM) 300 MG tablet TAKE 1 TABLET BY MOUTH EVERY DAY 30 tablet 5  ALPRAZolam (XANAX) 1 MG tablet Take 1 mg by mouth 3 (three) times daily as needed.     ELIQUIS 5 MG TABS tablet TAKE 1 TABLET BY MOUTH TWICE DAILY, this replaces THE lovenox injections (Patient taking differently: Take 5 mg by mouth 2 (two) times daily.) 60 tablet 3   feeding supplement (ENSURE ENLIVE / ENSURE PLUS) LIQD Take 237 mLs by mouth 2 (two) times daily between meals. 237 mL 12   HYDROcodone-acetaminophen (HYCET) 7.5-325 mg/15 ml solution Take 10 mLs by mouth 4 (four) times daily as needed for moderate pain. 120 mL 0   lidocaine (XYLOCAINE) 2 % solution Patient: Mix 1part 2% viscous lidocaine, 1part H20. Swallow 35m of diluted mixture, 338m before meals and  at bedtime, up to QID 200 mL 2   lidocaine-prilocaine (EMLA) cream Apply 1 application topically as needed. 30-60 mins prior to port a cath access. Start using 2 weeks after port a cath placement. 30 g 0   Multiple Vitamin (ONE-A-DAY MENS PO) Take by mouth.     ondansetron (ZOFRAN) 8 MG tablet Take 1 tablet (8 mg total) by mouth 2 (two) times daily as needed for refractory nausea / vomiting. Start on day 3 after cyclophosphamide chemotherapy. 30 tablet 1   sertraline (ZOLOFT) 50 MG tablet Take 50 mg by mouth daily.     sodium chloride flush (NS) 0.9 % SOLN 10-40 mLs by Intracatheter route as needed (flush). 1000 mL 0   Vitamin D, Ergocalciferol, (DRISDOL) 1.25 MG (50000 UNIT) CAPS capsule TAKE ONE CAPSULE BY MOUTH ONCE a WEEK 12 capsule 0   No current facility-administered medications for this visit.    REVIEW OF SYSTEMS:   .10 Point review of Systems was done is negative except as noted above.   PHYSICAL EXAMINATION: ECOG PERFORMANCE STATUS: 1 - Symptomatic but completely ambulatory .BP 129/70   Pulse 88   Temp (!) 97.3 F (36.3 C)   Resp 18   Wt 279 lb 9.6 oz (126.8 kg)   SpO2 96%   BMI 41.29 kg/m  . GENERAL:alert, in no acute distress and comfortable SKIN: no acute rashes, no significant lesions EYES: conjunctiva are pink and non-injected, sclera anicteric OROPHARYNX: MMM, no exudates, no oropharyngeal erythema or ulceration NECK: supple, no JVD LYMPH:  no palpable lymphadenopathy in the cervical, axillary or inguinal regions LUNGS: clear to auscultation b/l with normal respiratory effort HEART: regular rate & rhythm ABDOMEN:  normoactive bowel sounds , non tender, not distended. Extremity: no pedal edema PSYCH: alert & oriented x 3 with fluent speech NEURO: no focal motor/sensory deficits   LABORATORY DATA:  I have reviewed the data as listed .    Latest Ref Rng & Units 04/02/2022   12:19 PM 03/28/2022    4:58 AM 03/27/2022    4:42 AM  CBC  WBC 4.0 - 10.5 K/uL 5.9   6.8  6.7   Hemoglobin 13.0 - 17.0 g/dL 12.6  11.4  10.6   Hematocrit 39.0 - 52.0 % 37.4  34.4  32.6   Platelets 150 - 400 K/uL 368  270  246     .    Latest Ref Rng & Units 04/02/2022   12:19 PM 03/27/2022    4:42 AM 03/26/2022    8:26 AM  CMP  Glucose 70 - 99 mg/dL 99  97  117   BUN 8 - 23 mg/dL '15  15  21   ' Creatinine 0.61 - 1.24 mg/dL 1.15  1.02  1.30   Sodium 135 -  145 mmol/L 139  136  133   Potassium 3.5 - 5.1 mmol/L 4.2  3.8  3.4   Chloride 98 - 111 mmol/L 103  104  102   CO2 22 - 32 mmol/L '30  23  23   ' Calcium 8.9 - 10.3 mg/dL 9.6  8.5  8.1   Total Protein 6.5 - 8.1 g/dL 7.3     Total Bilirubin 0.3 - 1.2 mg/dL 0.5     Alkaline Phos 38 - 126 U/L 75     AST 15 - 41 U/L 22     ALT 0 - 44 U/L 42       . Lab Results  Component Value Date   LDH 178 01/09/2022    SURGICAL PATHOLOGY  CASE: [id number]  PATIENT: Joe [name]  Surgical Pathology Report   Clinical History: neck mass (cm)   FINAL MICROSCOPIC DIAGNOSIS:   A. SOFT TISSUE MASS, LEFT NECK, BIOPSY:  - Non-Hodgkin B-cell lymphoma, see comment.   COMMENT:   The majority of the fragments have extensive crush artifact hampering  evaluation. One fragment is well preserved and demonstrates an atypical  lymphoid proliferation with predominately small irregular lymphocytes.  Immunohistochemistry is positive for CD20, bcl-2, bcl-6 and CD10. CD23  is largely negative. CyclinD1 and light chain in situ hybridization are  negative. Ki-67 is 20-25%. CD3 and CD5 highlight scattered T-cells. Flow  cytometry ([id number]) reveals predominately T-cells. Overall, the  findings are consistent with a non-Hodgkin B-cell lymphoma, specifically  follicular lymphoma. There is only limited preserved tissue hampering  grading, but this area appears low grade (grade 1-2 of 3). A higher  grade process in the crushed fragments cannot be excluded. Clinical  correlation is recommended. Dr. Redmond Baseman was attempted on 07/18/2021.    RADIOGRAPHIC STUDIES: I have personally reviewed the radiological images as listed and agreed with the findings in the report.  DG Chest Port 1 View  Result Date: 03/24/2022 CLINICAL DATA:  Possible sepsis. History of throat cancer, currently on chemotherapy. EXAM: PORTABLE CHEST 1 VIEW COMPARISON:  10/13/2021 prior studies FINDINGS: The cardiomediastinal silhouette is unremarkable. RIGHT IJ Port-A-Cath again noted with tip overlying the mid SVC. There is no evidence of focal airspace disease, pulmonary edema, suspicious pulmonary nodule/mass, pleural effusion, or pneumothorax. No acute bony abnormalities are identified. IMPRESSION: No active disease. Electronically Signed   By: Margarette Canada M.D.   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 Based on limited sampling the patient's pathology reveals a grade 1 through 2 follicular lymphoma with a Ki-67 of 25%.  There was a lot of crush artifact in the sample and therefore a high-grade process could not be ruled out. However the predominant element of his mass has grown significantly over the last 1 month which is concerning for a high-grade follicular lymphoma or follicular lymphoma with transformation to large B-cell lymphoma.  CT neck imaging shows concern with compression of left internal jugular vein with mild mass-effect on the airway in the neck and the left carotid is inseparable from the mass.  High risk of syncopal episodes if there is compression of the common carotid baroreceptors and left vagus nerve  PET/CT 08/10/2021 showed  1. Examination is positive for large FDG avid mass within the left neck extending from the level 2 nodal chain into the left side of the superior mediastinum compatible with  metabolically active tumor. 2. There are 2 foci of increased uptake within the neck and body of pancreas  lymphomatous involvement of the pancreas. 3. Subcentimeter FDG avid right inguinal lymph node also concerning for lymphoma. 4. Several foci of increased uptake within the axial skeleton compatible with malignancy  2) HTN  3) Obstructive sleep apnea not on CPAP machine.  This is causing sleep issues and possible cognitive issues.  4) Depression and anxiety  5) Acute pulmonary embolism due to lymphoma plus body habitus plus possible vascular compression - on lovenox  6) PICC line in situ PLAN Patient with lab results from today were discussed in detail. CBC within normal limits CMP stable Patient has completed her I-S RT. had grade 2 radiation mucositis which is resolving. No other acute residual toxicities from his chemoimmunotherapy at this time. He was recommended to update his vaccines with his primary care physician. No indication for additional chemoimmunotherapy at this time. Discussed survivorship follow-up plan. We will see him back in 3 months with port flush and labs. We will plan to repeat imaging in 6 months.  Followup RTC with Dr Irene Limbo with portflush and labs in 3 months  The total time spent in the appointment was 25 minutes*.  All of the patient's questions were answered with apparent satisfaction. The patient knows to call the clinic with any problems, questions or concerns.   Sullivan Lone MD MS AAHIVMS [facility] Hematology/Oncology Physician [facility]  .*Total Encounter Time as defined by the Centers for Medicare and Medicaid Services includes, in addition to the face-to-face time of a patient visit (documented in the note above) non-face-to-face time: obtaining and reviewing outside history, ordering and reviewing medications, tests or procedures, care coordination (communications with other health care professionals or caregivers) and documentation in the medical record.

## 2022-04-12 ENCOUNTER — Encounter: Payer: Self-pay | Admitting: Hematology

## 2022-05-13 ENCOUNTER — Other Ambulatory Visit: Payer: Self-pay | Admitting: Hematology

## 2022-07-03 ENCOUNTER — Inpatient Hospital Stay: Payer: Medicaid Other

## 2022-07-03 ENCOUNTER — Other Ambulatory Visit: Payer: Self-pay

## 2022-07-03 ENCOUNTER — Inpatient Hospital Stay: Payer: Medicaid Other | Attending: Hematology | Admitting: Hematology

## 2022-07-03 VITALS — BP 135/82 | HR 80 | Temp 97.7°F | Resp 15 | Wt 283.3 lb

## 2022-07-03 DIAGNOSIS — C8221 Follicular lymphoma grade III, unspecified, lymph nodes of head, face, and neck: Secondary | ICD-10-CM

## 2022-07-03 DIAGNOSIS — Z7901 Long term (current) use of anticoagulants: Secondary | ICD-10-CM | POA: Insufficient documentation

## 2022-07-03 DIAGNOSIS — C8288 Other types of follicular lymphoma, lymph nodes of multiple sites: Secondary | ICD-10-CM | POA: Diagnosis not present

## 2022-07-03 DIAGNOSIS — I2699 Other pulmonary embolism without acute cor pulmonale: Secondary | ICD-10-CM | POA: Diagnosis not present

## 2022-07-03 DIAGNOSIS — Z452 Encounter for adjustment and management of vascular access device: Secondary | ICD-10-CM

## 2022-07-03 DIAGNOSIS — Z7189 Other specified counseling: Secondary | ICD-10-CM

## 2022-07-03 LAB — CBC WITH DIFFERENTIAL (CANCER CENTER ONLY)
Abs Immature Granulocytes: 0.01 10*3/uL (ref 0.00–0.07)
Basophils Absolute: 0.1 10*3/uL (ref 0.0–0.1)
Basophils Relative: 1 %
Eosinophils Absolute: 0.2 10*3/uL (ref 0.0–0.5)
Eosinophils Relative: 4 %
HCT: 41.3 % (ref 39.0–52.0)
Hemoglobin: 14 g/dL (ref 13.0–17.0)
Immature Granulocytes: 0 %
Lymphocytes Relative: 27 %
Lymphs Abs: 1.4 10*3/uL (ref 0.7–4.0)
MCH: 30.8 pg (ref 26.0–34.0)
MCHC: 33.9 g/dL (ref 30.0–36.0)
MCV: 90.8 fL (ref 80.0–100.0)
Monocytes Absolute: 0.5 10*3/uL (ref 0.1–1.0)
Monocytes Relative: 10 %
Neutro Abs: 3 10*3/uL (ref 1.7–7.7)
Neutrophils Relative %: 58 %
Platelet Count: 191 10*3/uL (ref 150–400)
RBC: 4.55 MIL/uL (ref 4.22–5.81)
RDW: 16 % — ABNORMAL HIGH (ref 11.5–15.5)
WBC Count: 5.1 10*3/uL (ref 4.0–10.5)
nRBC: 0 % (ref 0.0–0.2)

## 2022-07-03 LAB — CMP (CANCER CENTER ONLY)
ALT: 14 U/L (ref 0–44)
AST: 18 U/L (ref 15–41)
Albumin: 4.3 g/dL (ref 3.5–5.0)
Alkaline Phosphatase: 61 U/L (ref 38–126)
Anion gap: 4 — ABNORMAL LOW (ref 5–15)
BUN: 15 mg/dL (ref 8–23)
CO2: 31 mmol/L (ref 22–32)
Calcium: 9.5 mg/dL (ref 8.9–10.3)
Chloride: 103 mmol/L (ref 98–111)
Creatinine: 1.01 mg/dL (ref 0.61–1.24)
GFR, Estimated: >60 mL/min (ref >60)
Glucose, Bld: 100 mg/dL — ABNORMAL HIGH (ref 70–99)
Potassium: 4.2 mmol/L (ref 3.5–5.1)
Sodium: 138 mmol/L (ref 135–145)
Total Bilirubin: 0.5 mg/dL (ref 0.3–1.2)
Total Protein: 7 g/dL (ref 6.5–8.1)

## 2022-07-03 MED ORDER — HEPARIN SOD (PORK) LOCK FLUSH 100 UNIT/ML IV SOLN
500.0000 [IU] | Freq: Once | INTRAVENOUS | Status: AC
  Administered 2022-07-03: 500 [IU]

## 2022-07-03 MED ORDER — SODIUM CHLORIDE 0.9% FLUSH
10.0000 mL | Freq: Once | INTRAVENOUS | Status: AC
  Administered 2022-07-03: 10 mL

## 2022-07-05 ENCOUNTER — Other Ambulatory Visit: Payer: Self-pay | Admitting: Orthopedic Surgery

## 2022-07-05 ENCOUNTER — Other Ambulatory Visit: Payer: Self-pay | Admitting: Hematology

## 2022-07-09 ENCOUNTER — Encounter: Payer: Self-pay | Admitting: Hematology

## 2022-07-09 NOTE — Progress Notes (Signed)
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 male here for continued valuation and management of high-grade follicular lymphoma. He reports He is doing well with no new symptoms or concerns.  We discussed getting CT neck/CAP for further evaluation and monitoring which he is agreeable to.  No fever, chills, night sweats. No new lumps, bumps, or lesions/rashes. No other new or acute focal symptoms.  Labs done today were reviewed in detail.  MEDICAL HISTORY:  Past Medical History:  Diagnosis Date   Anxiety    Arthritis    COVID    x 2 (2020 and 2022)   Depression    Follicular lymphoma ([place])    Hypertension    Left leg DVT ([place]) 07/2021   Pulmonary embolism ([place]) 07/2021    SURGICAL HISTORY: Past Surgical History:  Procedure Laterality Date   carpel tunnel     IR IMAGING GUIDED PORT INSERTION  10/10/2021   MASS BIOPSY Left 07/14/2021   Procedure: NECK MASS BIOPSY;  Surgeon: Melida Quitter, MD;  Location: [facility] OR;  Service: ENT;  Laterality: Left;    SOCIAL HISTORY: Social History   Socioeconomic History   Marital status: Widowed    Spouse name: Not on file   Number of children: Not on file   Years of education: Not on file   Highest education level: Not on file  Occupational History   Not on file  Tobacco Use   Smoking status: Former    Packs/day: 0.25    Types: Cigarettes    Quit date: 11/01/1986    Years since  quitting: 35.7   Smokeless tobacco: Former    Types: Chew    Quit date: 11/28/1987  Vaping Use   Vaping Use: Never used  Substance and Sexual Activity   Alcohol use: Yes    Comment: rare   Drug use: No   Sexual activity: Not Currently  Other Topics Concern   Not on file  Social History Narrative   Not on file   Social Determinants of Health   Financial Resource Strain: Not on file  Food Insecurity: Not on file  Transportation Needs: Not on file  Physical Activity: Not on file  Stress: Not on file  Social Connections: Not on file  Intimate Partner Violence: Not on file    FAMILY HISTORY: Family History  Problem Relation Age of Onset   Healthy Mother    Healthy Father    Throat cancer Brother     ALLERGIES:  has No Known Allergies.  MEDICATIONS:  Current Outpatient Medications  Medication Sig Dispense Refill   acetaminophen (TYLENOL) 325 MG tablet Take 2 tablets (650 mg total) by mouth every 6 (six) hours as needed for mild pain (or Fever >/= 101). 20 tablet 0   albuterol (VENTOLIN HFA) 108 (90 Base) MCG/ACT inhaler Inhale 2 puffs into the lungs every 6 (six) hours as needed for wheezing or shortness of breath.     allopurinol (ZYLOPRIM) 300 MG tablet TAKE 1 TABLET BY MOUTH EVERY DAY 30  tablet 5   ALPRAZolam (XANAX) 1 MG tablet Take 1 mg by mouth 3 (three) times daily as needed.     feeding supplement (ENSURE ENLIVE / ENSURE PLUS) LIQD Take 237 mLs by mouth 2 (two) times daily between meals. 237 mL 12   lidocaine-prilocaine (EMLA) cream Apply 1 application topically as needed. 30-60 mins prior to port a cath access. Start using 2 weeks after port a cath placement. 30 g 0   Multiple Vitamin (ONE-A-DAY MENS PO) Take by mouth.     sertraline (ZOLOFT) 50 MG tablet Take 50 mg by mouth daily.     Vitamin D, Ergocalciferol, (DRISDOL) 1.25 MG (50000 UNIT) CAPS capsule TAKE ONE CAPSULE BY MOUTH ONCE a WEEK 12 capsule 0   ELIQUIS 5 MG TABS tablet TAKE 1 TABLET BY MOUTH TWICE DAILY,  this replaces THE lovenox injections 60 tablet 3   HYDROcodone-acetaminophen (HYCET) 7.5-325 mg/15 ml solution Take 10 mLs by mouth 4 (four) times daily as needed for moderate pain. (Patient not taking: Reported on 07/03/2022) 120 mL 0   lidocaine (XYLOCAINE) 2 % solution Patient: Mix 1part 2% viscous lidocaine, 1part H20. Swallow 2m of diluted mixture, 389m before meals and at bedtime, up to QID (Patient not taking: Reported on 07/03/2022) 200 mL 2   ondansetron (ZOFRAN) 8 MG tablet Take 1 tablet (8 mg total) by mouth 2 (two) times daily as needed for refractory nausea / vomiting. Start on day 3 after cyclophosphamide chemotherapy. (Patient not taking: Reported on 07/03/2022) 30 tablet 1   sodium chloride flush (NS) 0.9 % SOLN 10-40 mLs by Intracatheter route as needed (flush). (Patient not taking: Reported on 07/03/2022) 1000 mL 0   No current facility-administered medications for this visit.    REVIEW OF SYSTEMS:   .10 Point review of Systems was done is negative except as noted above.   PHYSICAL EXAMINATION: ECOG PERFORMANCE STATUS: 1 - Symptomatic but completely ambulatory .BP 135/82 (BP Location: Left Arm, Patient Position: Sitting)   Pulse 80   Temp 97.7 F (36.5 C) (Temporal)   Resp 15   Wt 283 lb 4.8 oz (128.5 kg)   SpO2 94%   BMI 41.84 kg/m  NAD GENERAL:alert, in no acute distress and comfortable SKIN: no acute rashes, no significant lesions EYES: conjunctiva are pink and non-injected, sclera anicteric NECK: supple, no JVD LYMPH:  no palpable lymphadenopathy in the cervical, axillary or inguinal regions LUNGS: clear to auscultation b/l with normal respiratory effort HEART: regular rate & rhythm ABDOMEN:  normoactive bowel sounds , non tender, not distended. Extremity: no pedal edema PSYCH: alert & oriented x 3 with fluent speech NEURO: no focal motor/sensory deficits  LABORATORY DATA:  I have reviewed the data as listed .    Latest Ref Rng & Units 07/03/2022   12:59  PM 04/02/2022   12:19 PM 03/28/2022    4:58 AM  CBC  WBC 4.0 - 10.5 K/uL 5.1  5.9  6.8   Hemoglobin 13.0 - 17.0 g/dL 14.0  12.6  11.4   Hematocrit 39.0 - 52.0 % 41.3  37.4  34.4   Platelets 150 - 400 K/uL 191  368  270     .    Latest Ref Rng & Units 07/03/2022   12:59 PM 04/02/2022   12:19 PM 03/27/2022    4:42 AM  CMP  Glucose 70 - 99 mg/dL 100  99  97   BUN 8 - 23 mg/dL _0 Creatinine 0.61 -  1.24 mg/dL 1.01  1.15  1.02   Sodium 135 - 145 mmol/L 138  139  136   Potassium 3.5 - 5.1 mmol/L 4.2  4.2  3.8   Chloride 98 - 111 mmol/L 103  103  104   CO2 22 - 32 mmol/L _0 Calcium 8.9 - 10.3 mg/dL 9.5  9.6  8.5   Total Protein 6.5 - 8.1 g/dL 7.0  7.3    Total Bilirubin 0.3 - 1.2 mg/dL 0.5  0.5    Alkaline Phos 38 - 126 U/L 61  75    AST 15 - 41 U/L 18  22    ALT 0 - 44 U/L 14  42      . Lab Results  Component Value Date   LDH 178 01/09/2022    SURGICAL PATHOLOGY  CASE: [id number]  PATIENT: Jancarlo Brisky  Surgical Pathology Report   Clinical History: neck mass (cm)   FINAL MICROSCOPIC DIAGNOSIS:   A. SOFT TISSUE MASS, LEFT NECK, BIOPSY:  - Non-Hodgkin B-cell lymphoma, see comment.   COMMENT:   The majority of the fragments have extensive crush artifact hampering  evaluation. One fragment is well preserved and demonstrates an atypical  lymphoid proliferation with predominately small irregular lymphocytes.  Immunohistochemistry is positive for CD20, bcl-2, bcl-6 and CD10. CD23  is largely negative. CyclinD1 and light chain in situ hybridization are  negative. Ki-67 is 20-25%. CD3 and CD5 highlight scattered T-cells. Flow  cytometry ([id number]) reveals predominately T-cells. Overall, the  findings are consistent with a non-Hodgkin B-cell lymphoma, specifically  follicular lymphoma. There is only limited preserved tissue hampering  grading, but this area appears low grade (grade 1-2 of 3). A higher  grade process in the crushed fragments cannot be  excluded. Clinical  correlation is recommended. Dr. Redmond Baseman was attempted on 07/18/2021.   RADIOGRAPHIC STUDIES: I have personally reviewed the radiological images as listed and agreed with the findings in the report.  No results found.  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 Based on limited sampling the patient's pathology reveals a grade 1 through 2 follicular lymphoma with a Ki-67 of 25%.  There was a lot of crush artifact in the sample and therefore a high-grade process could not be ruled out. However the predominant element of his mass has grown significantly over the last 1 month which is concerning for a high-grade follicular lymphoma or follicular lymphoma with transformation to large B-cell lymphoma.  CT neck imaging shows concern with compression of left internal jugular vein with mild mass-effect on the airway in the neck and the left carotid is inseparable from the mass.  High risk of syncopal episodes if there is compression of the common carotid baroreceptors and left vagus nerve  PET/CT 08/10/2021 showed  1. Examination is positive for large FDG avid mass within the left neck extending from the level 2 nodal chain into the left side of the superior mediastinum compatible with metabolically active tumor. 2. There are 2 foci of increased uptake within the neck and body of pancreas lymphomatous involvement of the pancreas. 3. Subcentimeter FDG avid right inguinal lymph node also concerning for lymphoma. 4. Several foci of increased uptake within the axial skeleton compatible with malignancy  2) HTN  3) Obstructive sleep apnea not on CPAP machine.  This is causing sleep issues and possible cognitive issues.  4) Depression and anxiety  5) Acute pulmonary embolism due to lymphoma plus body habitus plus possible vascular compression - on lovenox  6) PICC line in  situ PLAN Patient with lab results from today were discussed in detail. CBC within normal limits CMP stable No other acute residual toxicities from his chemoimmunotherapy at this time. no lab or clinical evidence of lymphoma recurrence/progression at this time No indication for additional chemoimmunotherapy at this time. Ordered CT neck/CAP to be done in 14 weeks.  Followup Portflush in 2 months Portflush, labs and MD visit visit in 4 months CT neck/CAP in 14 weeks  The total time spent in the appointment was 20 minutes*.  All of the patient's questions were answered with apparent satisfaction. The patient knows to call the clinic with any problems, questions or concerns.   Sullivan Lone MD MS AAHIVMS [facility] Hematology/Oncology Physician [facility]  .*Total Encounter Time as defined by the Centers for Medicare and Medicaid Services includes, in addition to the face-to-face time of a patient visit (documented in the note above) non-face-to-face time: obtaining and reviewing outside history, ordering and reviewing medications, tests or procedures, care coordination (communications with other health care professionals or caregivers) and documentation in the medical record.  I, Melene Muller, am acting as scribe for Dr. Sullivan Lone, MD.  .I have reviewed the above documentation for accuracy and completeness, and I agree with the above. Brunetta Genera MD

## 2022-07-12 ENCOUNTER — Other Ambulatory Visit: Payer: Self-pay | Admitting: Orthopedic Surgery

## 2022-07-12 ENCOUNTER — Telehealth: Payer: Self-pay | Admitting: Hematology

## 2022-07-12 NOTE — Telephone Encounter (Signed)
Scheduled follow-up appointments per 9/13 los. Patient is aware.

## 2022-07-27 ENCOUNTER — Other Ambulatory Visit: Payer: Self-pay | Admitting: Hematology

## 2022-07-28 ENCOUNTER — Other Ambulatory Visit: Payer: Self-pay | Admitting: Orthopaedic Surgery

## 2022-07-29 ENCOUNTER — Encounter: Payer: Self-pay | Admitting: Hematology

## 2022-09-02 ENCOUNTER — Other Ambulatory Visit: Payer: Self-pay

## 2022-09-02 ENCOUNTER — Inpatient Hospital Stay: Payer: Medicaid Other | Attending: Hematology

## 2022-09-02 DIAGNOSIS — Z452 Encounter for adjustment and management of vascular access device: Secondary | ICD-10-CM | POA: Diagnosis present

## 2022-09-02 DIAGNOSIS — C8288 Other types of follicular lymphoma, lymph nodes of multiple sites: Secondary | ICD-10-CM | POA: Diagnosis present

## 2022-09-02 MED ORDER — HEPARIN SOD (PORK) LOCK FLUSH 100 UNIT/ML IV SOLN
500.0000 [IU] | Freq: Once | INTRAVENOUS | Status: AC
  Administered 2022-09-02: 500 [IU]

## 2022-09-02 MED ORDER — SODIUM CHLORIDE 0.9% FLUSH
10.0000 mL | Freq: Once | INTRAVENOUS | Status: AC
  Administered 2022-09-02: 10 mL

## 2022-10-09 DIAGNOSIS — G4733 Obstructive sleep apnea (adult) (pediatric): Secondary | ICD-10-CM | POA: Diagnosis not present

## 2022-10-16 DIAGNOSIS — G4733 Obstructive sleep apnea (adult) (pediatric): Secondary | ICD-10-CM | POA: Diagnosis not present

## 2022-10-21 ENCOUNTER — Other Ambulatory Visit: Payer: Self-pay | Admitting: Hematology

## 2022-10-23 ENCOUNTER — Encounter: Payer: Self-pay | Admitting: Hematology

## 2022-10-25 ENCOUNTER — Ambulatory Visit (HOSPITAL_COMMUNITY)
Admission: RE | Admit: 2022-10-25 | Discharge: 2022-10-25 | Disposition: A | Discharge status: Home / Self Care | Payer: 59 | Source: Ambulatory Visit | Attending: Hematology | Admitting: Hematology

## 2022-10-25 DIAGNOSIS — M50322 Other cervical disc degeneration at C5-C6 level: Secondary | ICD-10-CM | POA: Diagnosis not present

## 2022-10-25 DIAGNOSIS — K439 Ventral hernia without obstruction or gangrene: Secondary | ICD-10-CM | POA: Diagnosis not present

## 2022-10-25 DIAGNOSIS — C8221 Follicular lymphoma grade III, unspecified, lymph nodes of head, face, and neck: Secondary | ICD-10-CM | POA: Insufficient documentation

## 2022-10-25 DIAGNOSIS — Z8572 Personal history of non-Hodgkin lymphomas: Secondary | ICD-10-CM | POA: Diagnosis not present

## 2022-10-25 DIAGNOSIS — I2510 Atherosclerotic heart disease of native coronary artery without angina pectoris: Secondary | ICD-10-CM | POA: Diagnosis not present

## 2022-10-25 DIAGNOSIS — M160 Bilateral primary osteoarthritis of hip: Secondary | ICD-10-CM | POA: Diagnosis not present

## 2022-10-25 DIAGNOSIS — M533 Sacrococcygeal disorders, not elsewhere classified: Secondary | ICD-10-CM | POA: Diagnosis not present

## 2022-10-25 LAB — POCT I-STAT CREATININE: Creatinine, Ser: 1.3 mg/dL — ABNORMAL HIGH (ref 0.61–1.24)

## 2022-10-25 MED ORDER — IOHEXOL 300 MG/ML  SOLN
100.0000 mL | Freq: Once | INTRAMUSCULAR | Status: AC | PRN
  Administered 2022-10-25: 100 mL via INTRAVENOUS

## 2022-10-25 MED ORDER — HEPARIN SOD (PORK) LOCK FLUSH 100 UNIT/ML IV SOLN: 500.0000 [IU] | Freq: Once | INTRAVENOUS | Status: DC

## 2022-10-25 MED ORDER — SODIUM CHLORIDE (PF) 0.9 % IJ SOLN
INTRAMUSCULAR | Status: AC
  Filled 2022-10-25: qty 50

## 2022-11-04 ENCOUNTER — Other Ambulatory Visit: Payer: Self-pay

## 2022-11-04 ENCOUNTER — Inpatient Hospital Stay: Payer: 59 | Attending: Hematology | Admitting: Hematology

## 2022-11-04 ENCOUNTER — Inpatient Hospital Stay (HOSPITAL_BASED_OUTPATIENT_CLINIC_OR_DEPARTMENT_OTHER): Payer: 59

## 2022-11-04 VITALS — BP 120/79 | HR 95 | Temp 97.8°F | Resp 18 | Ht 69.0 in | Wt 291.8 lb

## 2022-11-04 DIAGNOSIS — C8221 Follicular lymphoma grade III, unspecified, lymph nodes of head, face, and neck: Secondary | ICD-10-CM

## 2022-11-04 DIAGNOSIS — C8288 Other types of follicular lymphoma, lymph nodes of multiple sites: Secondary | ICD-10-CM | POA: Diagnosis not present

## 2022-11-04 DIAGNOSIS — G4733 Obstructive sleep apnea (adult) (pediatric): Secondary | ICD-10-CM | POA: Insufficient documentation

## 2022-11-04 DIAGNOSIS — Z452 Encounter for adjustment and management of vascular access device: Secondary | ICD-10-CM

## 2022-11-04 DIAGNOSIS — R69 Illness, unspecified: Secondary | ICD-10-CM | POA: Diagnosis not present

## 2022-11-04 DIAGNOSIS — F418 Other specified anxiety disorders: Secondary | ICD-10-CM | POA: Diagnosis not present

## 2022-11-04 DIAGNOSIS — I10 Essential (primary) hypertension: Secondary | ICD-10-CM | POA: Diagnosis not present

## 2022-11-04 DIAGNOSIS — Z79899 Other long term (current) drug therapy: Secondary | ICD-10-CM | POA: Diagnosis not present

## 2022-11-04 LAB — CBC WITH DIFFERENTIAL (CANCER CENTER ONLY)
Abs Immature Granulocytes: 0.01 10*3/uL (ref 0.00–0.07)
Basophils Absolute: 0.1 10*3/uL (ref 0.0–0.1)
Basophils Relative: 1 %
Eosinophils Absolute: 0.2 10*3/uL (ref 0.0–0.5)
Eosinophils Relative: 3 %
HCT: 42.8 % (ref 39.0–52.0)
Hemoglobin: 14.8 g/dL (ref 13.0–17.0)
Immature Granulocytes: 0 %
Lymphocytes Relative: 26 %
Lymphs Abs: 1.7 10*3/uL (ref 0.7–4.0)
MCH: 31.3 pg (ref 26.0–34.0)
MCHC: 34.6 g/dL (ref 30.0–36.0)
MCV: 90.5 fL (ref 80.0–100.0)
Monocytes Absolute: 0.6 10*3/uL (ref 0.1–1.0)
Monocytes Relative: 9 %
Neutro Abs: 4.1 10*3/uL (ref 1.7–7.7)
Neutrophils Relative %: 61 %
Platelet Count: 190 10*3/uL (ref 150–400)
RBC: 4.73 MIL/uL (ref 4.22–5.81)
RDW: 15 % (ref 11.5–15.5)
WBC Count: 6.6 10*3/uL (ref 4.0–10.5)
nRBC: 0 % (ref 0.0–0.2)

## 2022-11-04 LAB — CMP (CANCER CENTER ONLY)
ALT: 14 U/L (ref 0–44)
AST: 19 U/L (ref 15–41)
Albumin: 4.1 g/dL (ref 3.5–5.0)
Alkaline Phosphatase: 63 U/L (ref 38–126)
Anion gap: 5 (ref 5–15)
BUN: 18 mg/dL (ref 8–23)
CO2: 30 mmol/L (ref 22–32)
Calcium: 9.9 mg/dL (ref 8.9–10.3)
Chloride: 104 mmol/L (ref 98–111)
Creatinine: 1.11 mg/dL (ref 0.61–1.24)
GFR, Estimated: >60 mL/min (ref >60)
Glucose, Bld: 92 mg/dL (ref 70–99)
Potassium: 4.2 mmol/L (ref 3.5–5.1)
Sodium: 139 mmol/L (ref 135–145)
Total Bilirubin: 0.6 mg/dL (ref 0.3–1.2)
Total Protein: 7.3 g/dL (ref 6.5–8.1)

## 2022-11-04 LAB — LACTATE DEHYDROGENASE: LDH: 126 U/L (ref 98–192)

## 2022-11-04 MED ORDER — HEPARIN SOD (PORK) LOCK FLUSH 100 UNIT/ML IV SOLN
500.0000 [IU] | Freq: Once | INTRAVENOUS | Status: AC
  Administered 2022-11-04: 500 [IU]

## 2022-11-04 MED ORDER — SODIUM CHLORIDE 0.9% FLUSH
10.0000 mL | Freq: Once | INTRAVENOUS | Status: AC
  Administered 2022-11-04: 10 mL

## 2022-11-04 MED ORDER — ASPIRIN 81 MG PO TBEC: 81.0000 mg | DELAYED_RELEASE_TABLET | Freq: Every day | ORAL | 12 refills | Status: AC

## 2022-11-04 NOTE — Progress Notes (Signed)
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 male here for continued valuation and management of high-grade follicular lymphoma.  Patient was last seen by me on 07/03/2022 and was doing well overall.   He notes no acute new issues since his last clinic visit. No fevers/chills/night sweats/unexpecrted weight loss. No new lumps or bumps.  Labs from today were discussed in details CT neck, chest/abd/pelvis results were discussed in details.  MEDICAL HISTORY:  Past Medical History:  Diagnosis Date   Anxiety    Arthritis    COVID    x 2 (2020 and 2022)   Depression    Follicular lymphoma ([place])    Hypertension    Left leg DVT ([place]) 07/2021   Pulmonary embolism ([place]) 07/2021    SURGICAL HISTORY: Past Surgical History:  Procedure Laterality Date   carpel tunnel     IR IMAGING GUIDED PORT INSERTION  10/10/2021   MASS BIOPSY Left 07/14/2021   Procedure: NECK MASS BIOPSY;  Surgeon: Melida Quitter, MD;  Location: [facility] OR;  Service: ENT;  Laterality: Left;    SOCIAL HISTORY: Social History   Socioeconomic History   Marital status: Widowed    Spouse name: Not on file   Number of children: Not on file   Years of education: Not on file   Highest education level: Not on file  Occupational History   Not on file  Tobacco Use   Smoking status: Former    Packs/day: 0.25    Types: Cigarettes    Quit date: 11/01/1986    Years since  quitting: 36.0   Smokeless tobacco: Former    Types: Chew    Quit date: 11/28/1987  Vaping Use   Vaping Use: Never used  Substance and Sexual Activity   Alcohol use: Yes    Comment: rare   Drug use: No   Sexual activity: Not Currently  Other Topics Concern   Not on file  Social History Narrative   Not on file   Social Determinants of Health   Financial Resource Strain: Not on file  Food Insecurity: Not on file  Transportation Needs: Not on file  Physical Activity: Not on file  Stress: Not on file  Social Connections: Not on file  Intimate Partner Violence: Not on file    FAMILY HISTORY: Family History  Problem Relation Age of Onset   Healthy Mother    Healthy Father    Throat cancer Brother     ALLERGIES:  has No Known Allergies.  MEDICATIONS:  Current Outpatient Medications  Medication Sig Dispense Refill   acetaminophen (TYLENOL) 325 MG tablet Take 2 tablets (650 mg total) by mouth every 6 (six) hours as needed for mild pain (or Fever >/= 101). 20 tablet 0   albuterol (VENTOLIN HFA) 108 (90 Base) MCG/ACT inhaler Inhale 2 puffs into the lungs every 6 (six) hours as needed for wheezing or shortness of breath.     allopurinol (ZYLOPRIM) 300 MG tablet TAKE 1 TABLET BY MOUTH EVERY DAY 30 tablet  5   ALPRAZolam (XANAX) 1 MG tablet Take 1 mg by mouth 3 (three) times daily as needed.     ELIQUIS 5 MG TABS tablet TAKE 1 TABLET BY MOUTH TWICE DAILY, this replaces THE lovenox injections 60 tablet 3   feeding supplement (ENSURE ENLIVE / ENSURE PLUS) LIQD Take 237 mLs by mouth 2 (two) times daily between meals. 237 mL 12   HYDROcodone-acetaminophen (HYCET) 7.5-325 mg/15 ml solution Take 10 mLs by mouth 4 (four) times daily as needed for moderate pain. (Patient not taking: Reported on 07/03/2022) 120 mL 0   lidocaine (XYLOCAINE) 2 % solution Patient: Mix 1part 2% viscous lidocaine, 1part H20. Swallow 44m of diluted mixture, 342m before meals and at bedtime, up to QID (Patient not  taking: Reported on 07/03/2022) 200 mL 2   lidocaine-prilocaine (EMLA) cream Apply 1 application topically as needed. 30-60 mins prior to port a cath access. Start using 2 weeks after port a cath placement. 30 g 0   Multiple Vitamin (ONE-A-DAY MENS PO) Take by mouth.     ondansetron (ZOFRAN) 8 MG tablet Take 1 tablet (8 mg total) by mouth 2 (two) times daily as needed for refractory nausea / vomiting. Start on day 3 after cyclophosphamide chemotherapy. (Patient not taking: Reported on 07/03/2022) 30 tablet 1   sertraline (ZOLOFT) 50 MG tablet Take 50 mg by mouth daily.     sodium chloride flush (NS) 0.9 % SOLN 10-40 mLs by Intracatheter route as needed (flush). (Patient not taking: Reported on 07/03/2022) 1000 mL 0   Vitamin D, Ergocalciferol, (DRISDOL) 1.25 MG (50000 UNIT) CAPS capsule TAKE ONE CAPSULE BY MOUTH ONCE a WEEK 12 capsule 0   No current facility-administered medications for this visit.    REVIEW OF SYSTEMS:   .10 Point review of Systems was done is negative except as noted above.  PHYSICAL EXAMINATION: ECOG PERFORMANCE STATUS: 1 - Symptomatic but completely ambulatory .BP 120/79 (BP Location: Left Arm, Patient Position: Sitting)   Pulse 95   Temp 97.8 F (36.6 C) (Temporal)   Resp 18   Ht '5\' 9"'$  (1.753 m)   Wt 291 lb 12.8 oz (132.4 kg)   SpO2 97%   BMI 43.09 kg/m  . GENERAL:alert, in no acute distress and comfortable SKIN: no acute rashes, no significant lesions EYES: conjunctiva are pink and non-injected, sclera anicteric OROPHARYNX: MMM, no exudates, no oropharyngeal erythema or ulceration NECK: supple, no JVD LYMPH:  no palpable lymphadenopathy in the cervical, axillary or inguinal regions LUNGS: clear to auscultation b/l with normal respiratory effort HEART: regular rate & rhythm ABDOMEN:  normoactive bowel sounds , non tender, not distended. Extremity: no pedal edema PSYCH: alert & oriented x 3 with fluent speech NEURO: no focal motor/sensory  deficits   LABORATORY DATA:  I have reviewed the data as listed .    Latest Ref Rng & Units 11/04/2022    1:22 PM 07/03/2022   12:59 PM 04/02/2022   12:19 PM  CBC  WBC 4.0 - 10.5 K/uL 6.6  5.1  5.9   Hemoglobin 13.0 - 17.0 g/dL 14.8  14.0  12.6   Hematocrit 39.0 - 52.0 % 42.8  41.3  37.4   Platelets 150 - 400 K/uL 190  191  368     .    Latest Ref Rng & Units 11/04/2022    1:22 PM 10/25/2022   12:44 PM 07/03/2022   12:59 PM  CMP  Glucose 70 - 99 mg/dL 92   100   BUN  8 - 23 mg/dL 18   15   Creatinine 0.61 - 1.24 mg/dL 1.11  1.30  1.01   Sodium 135 - 145 mmol/L 139   138   Potassium 3.5 - 5.1 mmol/L 4.2   4.2   Chloride 98 - 111 mmol/L 104   103   CO2 22 - 32 mmol/L 30   31   Calcium 8.9 - 10.3 mg/dL 9.9   9.5   Total Protein 6.5 - 8.1 g/dL 7.3   7.0   Total Bilirubin 0.3 - 1.2 mg/dL 0.6   0.5   Alkaline Phos 38 - 126 U/L 63   61   AST 15 - 41 U/L 19   18   ALT 0 - 44 U/L 14   14     . Lab Results  Component Value Date   LDH 126 11/04/2022    SURGICAL PATHOLOGY  CASE: [id number]  PATIENT: Joe [name]  Surgical Pathology Report   Clinical History: neck mass (cm)   FINAL MICROSCOPIC DIAGNOSIS:   A. SOFT TISSUE MASS, LEFT NECK, BIOPSY:  - Non-Hodgkin B-cell lymphoma, see comment.   COMMENT:   The majority of the fragments have extensive crush artifact hampering  evaluation. One fragment is well preserved and demonstrates an atypical  lymphoid proliferation with predominately small irregular lymphocytes.  Immunohistochemistry is positive for CD20, bcl-2, bcl-6 and CD10. CD23  is largely negative. CyclinD1 and light chain in situ hybridization are  negative. Ki-67 is 20-25%. CD3 and CD5 highlight scattered T-cells. Flow  cytometry ([id number]) reveals predominately T-cells. Overall, the  findings are consistent with a non-Hodgkin B-cell lymphoma, specifically  follicular lymphoma. There is only limited preserved tissue hampering  grading, but this area  appears low grade (grade 1-2 of 3). A higher  grade process in the crushed fragments cannot be excluded. Clinical  correlation is recommended. Dr. Redmond Baseman was attempted on 07/18/2021.   RADIOGRAPHIC STUDIES: I have personally reviewed the radiological images as listed and agreed with the findings in the report.  CT Soft Tissue Neck W Contrast  Result Date: 10/28/2022 CLINICAL DATA:  Hematologic malignancy, assess treatment response. History of stage IV high-grade follicular lymphoma EXAM: CT NECK WITH CONTRAST TECHNIQUE: Multidetector CT imaging of the neck was performed using the standard protocol following the bolus administration of intravenous contrast. RADIATION DOSE REDUCTION: This exam was performed according to the departmental dose-optimization program which includes automated exposure control, adjustment of the mA and/or kV according to patient size and/or use of iterative reconstruction technique. CONTRAST:  15m OMNIPAQUE IOHEXOL 300 MG/ML  SOLN COMPARISON:  PETCT 01/03/2022 cervical spine CT 07/09/2021 FINDINGS: Pharynx and larynx: Normal. No mass or swelling. Salivary glands: No inflammation, mass, or stone. Thyroid: Normal Lymph nodes: No enlarged lymph nodes seen throughout the neck, including the metabolic tissue highlighted in the left jugular chain on prior PET CT. Vascular: Atheromatous calcification. Unremarkable porta catheter on the right where covered. Limited intracranial: Negative although minimally covered Visualized orbits: Negative where covered Mastoids and visualized paranasal sinuses: Clear where covered Skeleton: Cervical spine degeneration with osteophyte and disc impinging on the cord at C5-6. No aggressive bone lesion. Upper chest: Negative IMPRESSION: 1. No enlarged or heterogeneous lymph nodes throughout the neck; no evidence of recurrent disease. 2. 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 Electronically Signed   By: JJorje GuildM.D.   On: 10/28/2022 06:35    CT CHEST ABDOMEN PELVIS W CONTRAST  Result Date: 10/26/2022 CLINICAL DATA:  History of follicular lymphoma, follow-up. *  Tracking Code: BO * EXAM: CT CHEST, ABDOMEN, AND PELVIS WITH CONTRAST TECHNIQUE: Multidetector CT imaging of the chest, abdomen and pelvis was performed following the standard protocol during bolus administration of intravenous contrast. RADIATION DOSE REDUCTION: This exam was performed according to the departmental dose-optimization program which includes automated exposure control, adjustment of the mA and/or kV according to patient size and/or use of iterative reconstruction technique. CONTRAST:  119m OMNIPAQUE IOHEXOL 300 MG/ML  SOLN COMPARISON:  Multiple priors including most recent PET-CT January 03, 2022 FINDINGS: CT CHEST FINDINGS Cardiovascular: Right chest Port-A-Cath with tip at the superior cavoatrial junction. Normal caliber thoracic aorta. No central pulmonary embolus on this nondedicated study. Normal size heart. No significant pericardial effusion/thickening. Minimal coronary artery calcifications. Mediastinum/Nodes: No suspicious thyroid nodule. No pathologically enlarged mediastinal, hilar or axillary lymph nodes. The esophagus is grossly unremarkable. Lungs/Pleura: No suspicious pulmonary nodules or masses. No focal airspace consolidation. No pleural effusion. No pneumothorax. Musculoskeletal: No aggressive lytic or blastic lesion of bone. Multilevel degenerative changes spine with bridging anterior vertebral osteophytes. CT ABDOMEN PELVIS FINDINGS Hepatobiliary: No suspicious hepatic lesion. Gallbladder is unremarkable. No biliary ductal dilation. Pancreas: No pancreatic ductal dilation or evidence of acute inflammation. Spleen: No splenomegaly or focal splenic lesion. Adrenals/Urinary Tract: Bilateral adrenal glands appear normal. No hydronephrosis. Kidneys demonstrate symmetric enhancement and excretion of contrast material. Urinary bladder is unremarkable for degree of  distension. Stomach/Bowel: Stomach is unremarkable for degree of distension. No pathologic dilation of small or large bowel. Normal appendix and terminal ileum. Small volume of formed stool throughout the colon. No evidence of acute bowel inflammation. Vascular/Lymphatic: Normal caliber abdominal aorta. Smooth IVC contours. No pathologically enlarged abdominal or pelvic lymph nodes. Reproductive: Prostate is unremarkable. Other: No significant abdominopelvic free fluid. Large fat containing ventral hernia. Musculoskeletal: No aggressive lytic or blastic lesion of bone. Multilevel degenerative change of the spine. Degenerative change of the bilateral SI joints with partial bony ankylosis of the right SI joint. Minimal degenerative spurring in the bilateral hips. IMPRESSION: 1. No adenopathy in the chest abdomen or pelvis and no splenomegaly. 2. Large fat containing ventral hernia. Electronically Signed   By: JDahlia BailiffM.D.   On: 10/26/2022 11:04    ASSESSMENT & PLAN:   67year old male with   1) Bulky Stage IV high grade follicular lymphoma with bulky left neck mass, pancreatic and osseous involvement. Based on limited sampling the patient's pathology reveals a grade 1 through 2 follicular lymphoma with a Ki-67 of 25%.  There was a lot of crush artifact in the sample and therefore a high-grade process could not be ruled out. However the predominant element of his mass has grown significantly over the last 1 month which is concerning for a high-grade follicular lymphoma or follicular lymphoma with transformation to large B-cell lymphoma.  CT neck imaging shows concern with compression of left internal jugular vein with mild mass-effect on the airway in the neck and the left carotid is inseparable from the mass.  High risk of syncopal episodes if there is compression of the common carotid baroreceptors and left vagus nerve  PET/CT 08/10/2021 showed  1. Examination is positive for large FDG avid mass  within the left neck extending from the level 2 nodal chain into the left side of the superior mediastinum compatible with metabolically active tumor. 2. There are 2 foci of increased uptake within the neck and body of pancreas lymphomatous involvement of the pancreas. 3. Subcentimeter FDG avid right inguinal lymph node also concerning for lymphoma.  4. Several foci of increased uptake within the axial skeleton compatible with malignancy  2) HTN  3) Obstructive sleep apnea not on CPAP machine.  This is causing sleep issues and possible cognitive issues.  4) Depression and anxiety  5) Acute pulmonary embolism due to lymphoma plus body habitus plus possible vascular compression - on lovenox  6) PICC line in situ  PLAN: Patient with lab results from today were discussed in detail. CBC within normal limits CMP stable LDH WNL @ 126 Ct neck, chest/abd/pelvis - show no LNadenopathy and no evidence of residual lymphoma at this time. - no significant residual toxicities from his chemoimmunotherapy -no lab, clinical or radiographic evidence of lymphoma recurrence/progression at this time. -No indication for additional chemoimmunotherapy at this time. -will have Port a cath removed  FOLLOW-UP:  IR for port a cath removal in 1-2 weeks RTC with Dr Irene Limbo with labs in 4 months   The total time spent in the appointment was 30 minutes* .  All of the patient's questions were answered with apparent satisfaction. The patient knows to call the clinic with any problems, questions or concerns.   Sullivan Lone MD MS AAHIVMS [facility] Hematology/Oncology Physician [facility]  .*Total Encounter Time as defined by the Centers for Medicare and Medicaid Services includes, in addition to the face-to-face time of a patient visit (documented in the note above) non-face-to-face time: obtaining and reviewing outside history, ordering and reviewing medications, tests or procedures, care coordination  (communications with other health care professionals or caregivers) and documentation in the medical record.   I, Cleda Mccreedy, am acting as a Education administrator for Sullivan Lone, MD. .I have reviewed the above documentation for accuracy and completeness, and I agree with the above. Brunetta Genera MD

## 2022-11-09 DIAGNOSIS — G4733 Obstructive sleep apnea (adult) (pediatric): Secondary | ICD-10-CM | POA: Diagnosis not present

## 2022-11-10 ENCOUNTER — Other Ambulatory Visit: Payer: Self-pay | Admitting: Hematology

## 2022-11-11 ENCOUNTER — Encounter: Payer: Self-pay | Admitting: Hematology

## 2022-11-16 DIAGNOSIS — G4733 Obstructive sleep apnea (adult) (pediatric): Secondary | ICD-10-CM | POA: Diagnosis not present

## 2022-11-19 DIAGNOSIS — G720 Drug-induced myopathy: Secondary | ICD-10-CM | POA: Diagnosis not present

## 2022-11-19 DIAGNOSIS — E119 Type 2 diabetes mellitus without complications: Secondary | ICD-10-CM | POA: Diagnosis not present

## 2022-11-19 DIAGNOSIS — Z8572 Personal history of non-Hodgkin lymphomas: Secondary | ICD-10-CM | POA: Diagnosis not present

## 2022-11-19 DIAGNOSIS — R69 Illness, unspecified: Secondary | ICD-10-CM | POA: Diagnosis not present

## 2022-11-19 DIAGNOSIS — M109 Gout, unspecified: Secondary | ICD-10-CM | POA: Diagnosis not present

## 2022-11-19 DIAGNOSIS — I10 Essential (primary) hypertension: Secondary | ICD-10-CM | POA: Diagnosis not present

## 2022-11-19 DIAGNOSIS — Z6841 Body Mass Index (BMI) 40.0 and over, adult: Secondary | ICD-10-CM | POA: Diagnosis not present

## 2022-11-19 DIAGNOSIS — E782 Mixed hyperlipidemia: Secondary | ICD-10-CM | POA: Diagnosis not present

## 2022-11-19 DIAGNOSIS — D649 Anemia, unspecified: Secondary | ICD-10-CM | POA: Diagnosis not present

## 2022-11-19 DIAGNOSIS — K429 Umbilical hernia without obstruction or gangrene: Secondary | ICD-10-CM | POA: Diagnosis not present

## 2022-11-25 ENCOUNTER — Other Ambulatory Visit: Payer: Self-pay | Admitting: Radiology

## 2022-11-26 ENCOUNTER — Ambulatory Visit (HOSPITAL_COMMUNITY)
Admission: RE | Admit: 2022-11-26 | Discharge: 2022-11-26 | Disposition: A | Discharge status: Home / Self Care | Payer: 59 | Source: Ambulatory Visit | Attending: Hematology | Admitting: Hematology

## 2022-11-26 ENCOUNTER — Encounter (HOSPITAL_COMMUNITY): Payer: Self-pay

## 2022-11-26 DIAGNOSIS — Z9221 Personal history of antineoplastic chemotherapy: Secondary | ICD-10-CM | POA: Insufficient documentation

## 2022-11-26 DIAGNOSIS — Z8572 Personal history of non-Hodgkin lymphomas: Secondary | ICD-10-CM | POA: Insufficient documentation

## 2022-11-26 DIAGNOSIS — I10 Essential (primary) hypertension: Secondary | ICD-10-CM | POA: Diagnosis not present

## 2022-11-26 DIAGNOSIS — Z86718 Personal history of other venous thrombosis and embolism: Secondary | ICD-10-CM | POA: Diagnosis not present

## 2022-11-26 DIAGNOSIS — Z452 Encounter for adjustment and management of vascular access device: Secondary | ICD-10-CM | POA: Insufficient documentation

## 2022-11-26 DIAGNOSIS — Z86711 Personal history of pulmonary embolism: Secondary | ICD-10-CM | POA: Insufficient documentation

## 2022-11-26 DIAGNOSIS — C8221 Follicular lymphoma grade III, unspecified, lymph nodes of head, face, and neck: Secondary | ICD-10-CM

## 2022-11-26 HISTORY — PX: IR REMOVAL TUN ACCESS W/ PORT W/O FL MOD SED: IMG2290

## 2022-11-26 MED ORDER — SODIUM CHLORIDE 0.9 % IV SOLN: INTRAVENOUS | Status: DC

## 2022-11-26 MED ORDER — MIDAZOLAM HCL 2 MG/2ML IJ SOLN
INTRAMUSCULAR | Status: AC
  Filled 2022-11-26: qty 2

## 2022-11-26 MED ORDER — MIDAZOLAM HCL 2 MG/2ML IJ SOLN
INTRAMUSCULAR | Status: AC | PRN
  Administered 2022-11-26 (×2): 1 mg via INTRAVENOUS

## 2022-11-26 MED ORDER — FENTANYL CITRATE (PF) 100 MCG/2ML IJ SOLN
INTRAMUSCULAR | Status: AC | PRN
  Administered 2022-11-26 (×2): 50 ug via INTRAVENOUS

## 2022-11-26 MED ORDER — LIDOCAINE-EPINEPHRINE 1 %-1:100000 IJ SOLN
INTRAMUSCULAR | Status: AC
  Administered 2022-11-26: 13 mL via INTRADERMAL
  Filled 2022-11-26: qty 1

## 2022-11-26 MED ORDER — FENTANYL CITRATE (PF) 100 MCG/2ML IJ SOLN
INTRAMUSCULAR | Status: AC
  Filled 2022-11-26: qty 2

## 2022-11-26 NOTE — Procedures (Signed)
Interventional Radiology Procedure:   Indications: History of lymphoma and completed treatment  Procedure: Port removal  Findings: Complete removal of right chest port  Complications: No immediate complications noted.     EBL: Minimal  Plan: Keep incision dry for 24 hours.  May discharge in 1 hour.   Joe [name] R. Anselm Pancoast, MD  Pager: [phone]

## 2022-11-26 NOTE — H&P (Signed)
Referring Physician(s): Brunetta Genera  Supervising Physician: Markus Daft  Patient Status:  [facility] OP  Chief Complaint: "I'm getting my port out"   Subjective: Patient known to IR service from PICC  placement in 2022 and port a cath placement in 2022. He has a hx of high grade follicular lymphoma, s/p chemo-immunotherapy, and now in remission. He presents today for port a cath removal. Additional med hx as below.   Past Medical History:  Diagnosis Date   Anxiety    Arthritis    COVID    x 2 (2020 and 2022)   Depression    Follicular lymphoma ([place])    Hypertension    Left leg DVT ([place]) 07/2021   Pulmonary embolism ([place]) 07/2021   Past Surgical History:  Procedure Laterality Date   carpel tunnel     IR IMAGING GUIDED PORT INSERTION  10/10/2021   MASS BIOPSY Left 07/14/2021   Procedure: NECK MASS BIOPSY;  Surgeon: Melida Quitter, MD;  Location: [facility];  Service: ENT;  Laterality: Left;      Allergies: Patient has no known allergies.  Medications: Prior to Admission medications   Medication Sig Start Date End Date Taking? Authorizing Provider  acetaminophen (TYLENOL) 325 MG tablet Take 2 tablets (650 mg total) by mouth every 6 (six) hours as needed for mild pain (or Fever >/= 101). 08/25/21   Raiford Noble Latif, DO  albuterol (VENTOLIN HFA) 108 (90 Base) MCG/ACT inhaler Inhale 2 puffs into the lungs every 6 (six) hours as needed for wheezing or shortness of breath. Patient not taking: Reported on 11/04/2022    [provider]  allopurinol (ZYLOPRIM) 300 MG tablet TAKE 1 TABLET BY MOUTH EVERY DAY 07/29/22   Carole Civil, MD  ALPRAZolam Duanne Moron) 1 MG tablet Take 1 mg by mouth 3 (three) times daily as needed. 08/30/21   [provider]  aspirin EC 81 MG tablet Take 1 tablet (81 mg total) by mouth daily. Swallow whole. Start after port a cath removal and discontinuation of your Eliquis. 11/04/22   Brunetta Genera, MD  feeding supplement (ENSURE  ENLIVE / ENSURE PLUS) LIQD Take 237 mLs by mouth 2 (two) times daily between meals. 08/25/21   Raiford Noble Latif, DO  HYDROcodone-acetaminophen (HYCET) 7.5-325 mg/15 ml solution Take 10 mLs by mouth 4 (four) times daily as needed for moderate pain. Patient not taking: Reported on 07/03/2022 02/07/22   Gery Pray, MD  lidocaine (XYLOCAINE) 2 % solution Patient: Mix 1part 2% viscous lidocaine, 1part H20. Swallow 51m of diluted mixture, 315m before meals and at bedtime, up to QID Patient not taking: Reported on 07/03/2022 02/04/22   SqEppie GibsonMD  lidocaine-prilocaine (EMLA) cream Apply 1 application topically as needed. 30-60 mins prior to port a cath access. Start using 2 weeks after port a cath placement. 10/18/21   KaBrunetta GeneraMD  Multiple Vitamin (ONE-A-DAY MENS PO) Take by mouth.    [provider]  ondansetron (ZOFRAN) 8 MG tablet Take 1 tablet (8 mg total) by mouth 2 (two) times daily as needed for refractory nausea / vomiting. Start on day 3 after cyclophosphamide chemotherapy. Patient not taking: Reported on 07/03/2022 08/14/21   KaBrunetta GeneraMD  sertraline (ZOLOFT) 50 MG tablet Take 50 mg by mouth daily.    [provider]  sodium chloride flush (NS) 0.9 % SOLN 10-40 mLs by Intracatheter route as needed (flush). 08/25/21   ShRaiford Nobleatif, DO  Vitamin D, Ergocalciferol, (DRISDOL) 1.25  MG (50000 UNIT) CAPS capsule TAKE ONE CAPSULE BY MOUTH ONCE a WEEK 10/22/22   Brunetta Genera, MD   Code Status: FULL CODE  Vital Signs: BP 128/82   Pulse 80   Temp 98.6 F (37 C) (Oral)   Resp 18   SpO2 93%   Physical Exam awake/alert; chest- CTA bilat; clean, intact rt chest wall port a cath: heart- RRR; abd- soft,+BS,NT; ext with FROM  Imaging: No results found.  Labs:  CBC: Recent Labs    03/28/22 0458 04/02/22 1219 07/03/22 1259 11/04/22 1322  WBC 6.8 5.9 5.1 6.6  HGB 11.4* 12.6* 14.0 14.8  HCT 34.4* 37.4* 41.3 42.8  PLT 270 368 191 190     COAGS: Recent Labs    03/24/22 1823  INR 1.4*    BMP: Recent Labs    03/27/22 0442 04/02/22 1219 07/03/22 1259 10/25/22 1244 11/04/22 1322  NA 136 139 138  --  139  K 3.8 4.2 4.2  --  4.2  CL 104 103 103  --  104  CO2 '23 30 31  '$ --  30  GLUCOSE 97 99 100*  --  92  BUN '15 15 15  '$ --  18  CALCIUM 8.5* 9.6 9.5  --  9.9  CREATININE 1.02 1.15 1.01 1.30* 1.11  GFRNONAA >60 >60 >60  --  >60    LIVER FUNCTION TESTS: Recent Labs    03/25/22 0419 04/02/22 1219 07/03/22 1259 11/04/22 1322  BILITOT 1.2 0.5 0.5 0.6  AST '24 22 18 19  '$ ALT 36 42 14 14  ALKPHOS 72 75 61 63  PROT 6.8 7.3 7.0 7.3  ALBUMIN 3.3* 3.9 4.3 4.1    Assessment and Plan: Patient known to IR service from PICC  placement in 2022 and port a cath placement in 2022. He has a hx of high grade follicular lymphoma, s/p chemo-immunotherapy, and now in remission. He presents today for port a cath removal. Additional med hx sig for PE/DVT, anxiety/depression, arthritis, HTN. Risks and benefits of procedure was discussed with the patient  including, but not limited to bleeding, infection, damage to adjacent structures .  All of the questions were answered and there is agreement to proceed.  Consent signed and in chart.   Electronically Signed: D. Rowe Robert, PA-C 11/26/2022, 1:01 PM   I spent a total of 15 Minutes at the the patient's bedside AND on the patient's hospital floor or unit, greater than 50% of which was counseling/coordinating care for port a cath removal

## 2022-11-26 NOTE — Discharge Instructions (Signed)
Implanted Port Removal, Care After The following information offers guidance on how to care for yourself after your procedure. Your health care provider may also give you more specific instructions. If you have problems or questions, contact your health care provider.  Urgent needs - Interventional Radiology on call MD [phone] Wound - May remove dressing and shower in 24 to 48 hours.  Keep site clean and dry.  Replace with bandaid as needed.  Do not submerge in tub or water until site healing well. If closed with glue, glue will flake off on its own.  What can I expect after the procedure? After the procedure, it is common to have: Soreness or pain near your incision. Some swelling or bruising near your incision. Follow these instructions at home: Medicines Take over-the-counter and prescription medicines only as told by your health care provider. If you were prescribed an antibiotic medicine, take it as told by your health care provider. Do not stop taking the antibiotic even if you start to feel better. Bathing Do not take baths, swim, or use a hot tub until your health care provider approves. Ask your health care provider if you can take showers. You may only be allowed to take sponge baths. Incision care Follow instructions from your health care provider about how to take care of your incision. Make sure you: Wash your hands with soap and water for at least 20 seconds before and after you change your bandage (dressing). If soap and water are not available, use hand sanitizer. Change your dressing as told by your health care provider. Keep your dressing dry. Leave stitches (sutures), skin glue, or adhesive strips in place. These skin closures may need to stay in place for 2 weeks or longer. If adhesive strip edges start to loosen and curl up, you may trim the loose edges. Do not remove adhesive strips completely unless your health care provider tells you to do that. Check your incision  area every day for signs of infection. Check for: More redness, swelling, or pain. More fluid or blood. Warmth. Pus or a bad smell. Activity Return to your normal activities as told by your health care provider. Ask your health care provider what activities are safe for you. You may have to avoid lifting. Ask your health care provider how much you can safely lift. Do not do activities that involve lifting your arms over your head. Driving If you were given a sedative during the procedure, it can affect you for several hours. Do not drive or operate machinery until your health care provider says that it is safe. If you did not receive a sedative, ask your health care provider when it is safe to drive. General instructions Do not use any products that contain nicotine or tobacco. These products include cigarettes, chewing tobacco, and vaping devices, such as e-cigarettes. These can delay healing after surgery. If you need help quitting, ask your health care provider. Keep all follow-up visits. This is important. Contact a health care provider if: You have a fever or chills. You have more redness, swelling, or pain around your incision. You have more fluid or blood coming from your incision. Your incision feels warm to the touch. You have pus or a bad smell coming from your incision. You have pain that is not relieved by your pain medicine. Get help right away if: You have chest pain. You have difficulty breathing. These symptoms may be an emergency. Get help right away. Call 911. Do not wait to   see if the symptoms will go away. Do not drive yourself to the hospital. Summary After the procedure, it is common to have pain, soreness, swelling, or bruising near your incision. If you were prescribed an antibiotic medicine, take it as told by your health care provider. Do not stop taking the antibiotic even if you start to feel better. If you were given a sedative during the procedure, it can  affect you for several hours. Do not drive or operate machinery until your health care provider says that it is safe. Return to your normal activities as told by your health care provider. Ask your health care provider what activities are safe for you. This information is not intended to replace advice given to you by your health care provider. Make sure you discuss any questions you have with your health care provider. Document Revised: 04/10/2021 Document Reviewed: 04/10/2021 [employer] Patient Education  2023 [employer]   Moderate Conscious Sedation, Adult, Care After This sheet gives you information about how to care for yourself after your procedure. Your health care provider may also give you more specific instructions. If you have problems or questions, contact your health care provider. What can I expect after the procedure? After the procedure, it is common to have: Sleepiness for several hours. Impaired judgment for several hours. Difficulty with balance. Vomiting if you eat too soon. Follow these instructions at home: For the time period you were told by your health care provider:   Rest. Do not participate in activities where you could fall or become injured. Do not drive or use machinery. Do not drink alcohol. Do not take sleeping pills or medicines that cause drowsiness. Do not make important decisions or sign legal documents. Do not take care of children on your own. Eating and drinking Follow the diet recommended by your health care provider. Drink enough fluid to keep your urine pale yellow. If you vomit: Drink water, juice, or soup when you can drink without vomiting. Make sure you have little or no nausea before eating solid foods. General instructions Take over-the-counter and prescription medicines only as told by your health care provider. Have a responsible adult stay with you for the time you are told. It is important to have someone help care for you until  you are awake and alert. Do not smoke. Keep all follow-up visits as told by your health care provider. This is important. Contact a health care provider if: You are still sleepy or having trouble with balance after 24 hours. You feel light-headed. You keep feeling nauseous or you keep vomiting. You develop a rash. You have a fever. You have redness or swelling around the IV site. Get help right away if: You have trouble breathing. You have new-onset confusion at home. Summary After the procedure, it is common to feel sleepy, have impaired judgment, or feel nauseous if you eat too soon. Rest after you get home. Know the things you should not do after the procedure. Follow the diet recommended by your health care provider and drink enough fluid to keep your urine pale yellow. Get help right away if you have trouble breathing or new-onset confusion at home. This information is not intended to replace advice given to you by your health care provider. Make sure you discuss any questions you have with your health care provider. Document Revised: 02/04/2020 Document Reviewed: 09/02/2019 Else[employer] Patient Education  2023 [employer]        

## 2022-12-10 DIAGNOSIS — G4733 Obstructive sleep apnea (adult) (pediatric): Secondary | ICD-10-CM | POA: Diagnosis not present

## 2022-12-17 DIAGNOSIS — G4733 Obstructive sleep apnea (adult) (pediatric): Secondary | ICD-10-CM | POA: Diagnosis not present

## 2022-12-19 ENCOUNTER — Encounter: Payer: Self-pay | Admitting: Radiology

## 2022-12-30 IMAGING — CT NM PET TUM IMG INITIAL (PI) SKULL BASE T - THIGH
7 series · 25 of 25 positions shown · non-contrast
Comparison: None

CLINICAL DATA: Initial treatment strategy for high-grade follicular
lymphoma.

EXAM:
NUCLEAR MEDICINE PET SKULL BASE TO THIGH
TECHNIQUE: 16.9 mCi F-18 FDG was injected intravenously. Full-ring PET imaging
was performed from the skull base to thigh after the radiotracer. CT
data was obtained and used for attenuation correction and anatomic
localization.
Fasting blood glucose: 121 mg/dl

[Series 3: pet sk_thigh ac · axial · 5.0mm · 4.07mm/px · z∈[-1474,-474]mm · 6 of 251 slices shown]
[im 1/251]
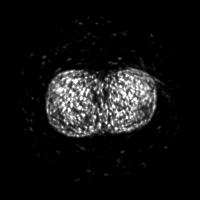
[im 51/251]
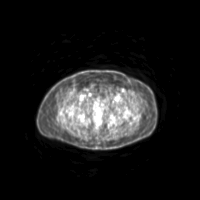
[im 101/251]
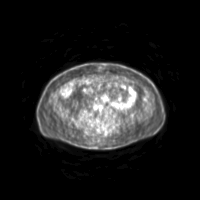
[im 151/251]
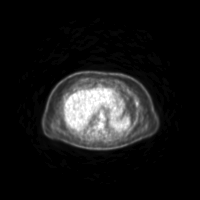
[im 201/251]
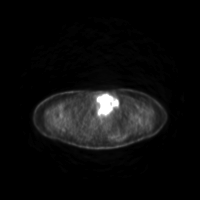
[im 251/251]
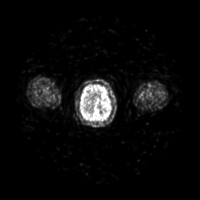

[Series 4: ct sk_thigh 5.0 bf37 · axial · 5.0mm · 0.98mm/px · z∈[-1474,-474]mm · 5 of 251 slices shown]
[im 1/251]
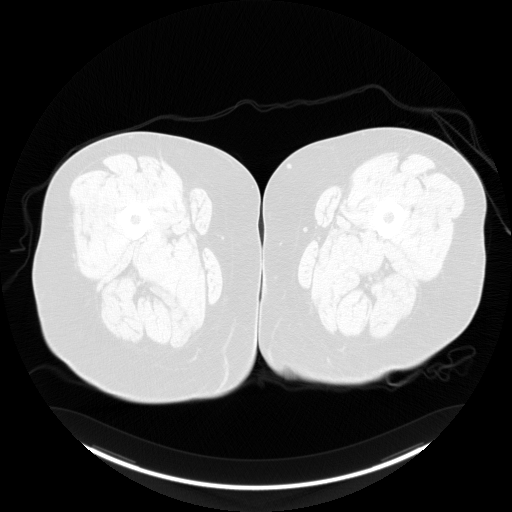
[im 63/251]
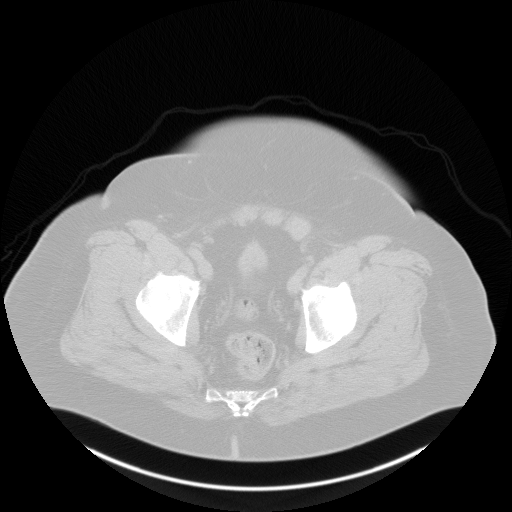
[im 126/251]
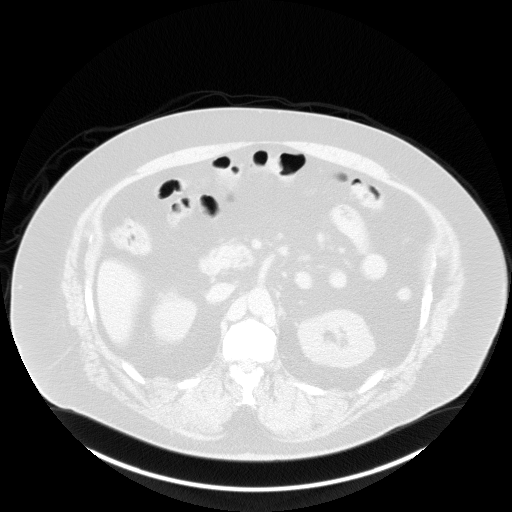
[im 188/251]
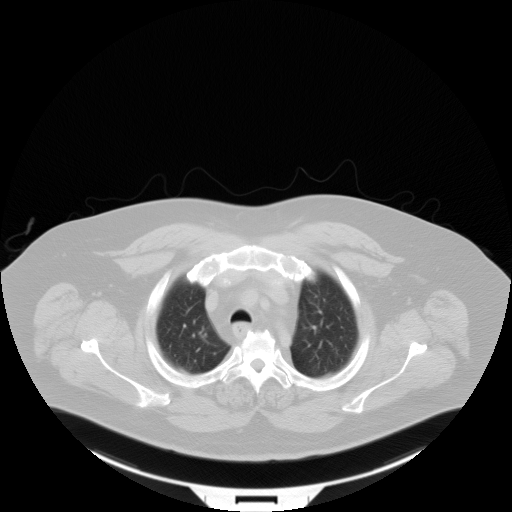
[im 251/251  brain]
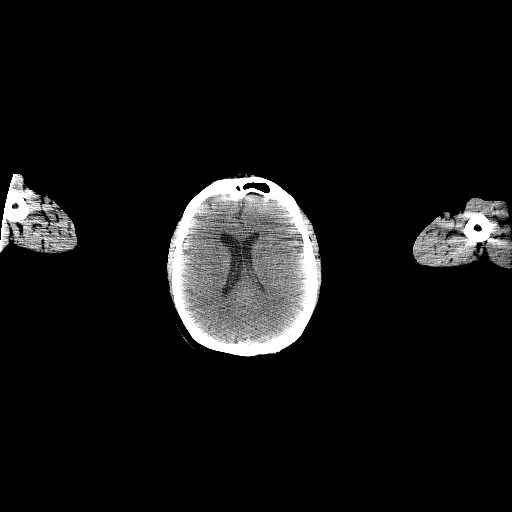

[Series 5: pet sk_thigh nac · axial · 5.0mm · 4.07mm/px · z∈[-1474,-474]mm · 5 of 251 slices shown]
[im 1/251]
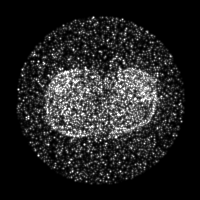
[im 63/251]
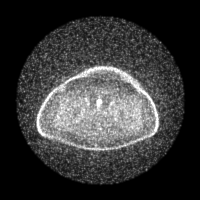
[im 126/251]
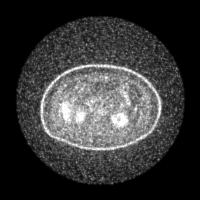
[im 188/251]
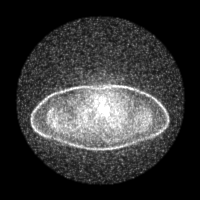
[im 251/251]
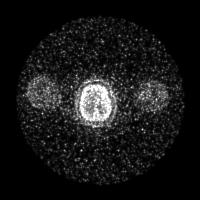

[Series 8: ct sk_thigh 5.0 br59 lung_bone · axial · 5.0mm · 0.74mm/px · 1 of 66 slices shown]
[im 1/66]
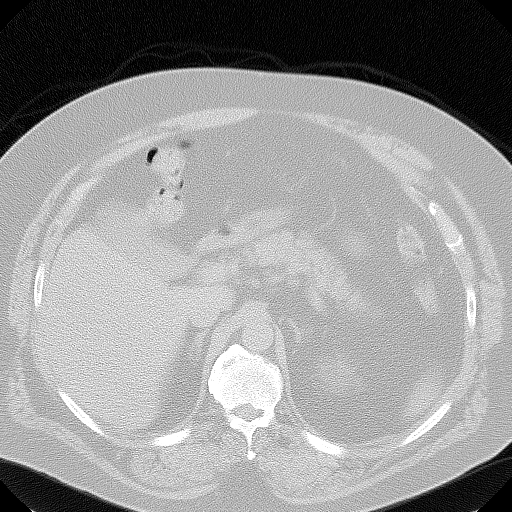

[Series 603: fused cor · 2 of 75 slices shown]
[im 1/75]
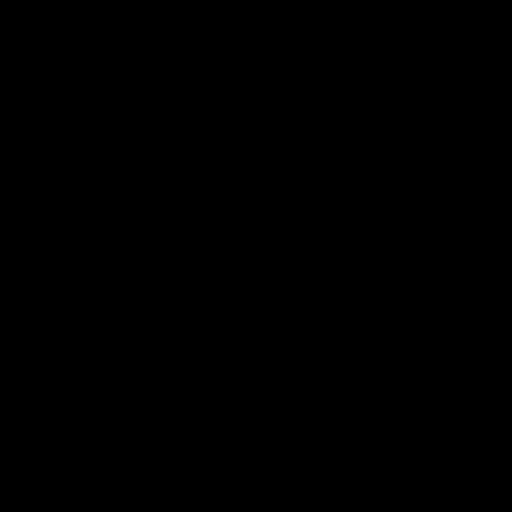
[im 75/75]
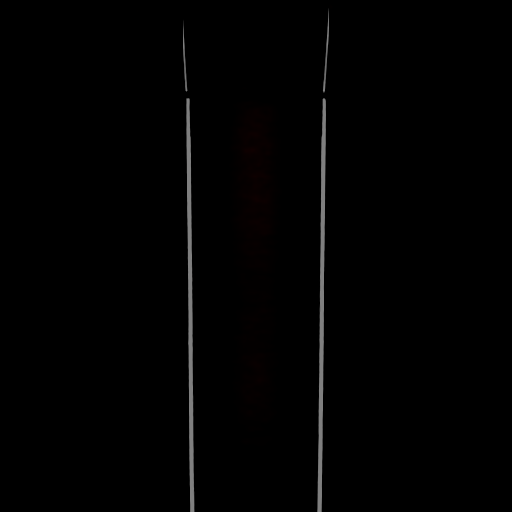

[Series 604: <mip collection> · coronal · 2.07mm/px · 1 of 32 slices shown]
[im 1/32]
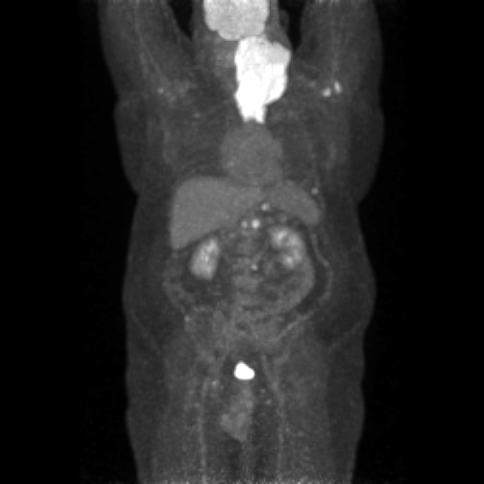

[Series 605: range-ct sk_thigh 5.0 bf37-tra-<alpha range> · 5 of 245 slices shown]
[im 1/245]
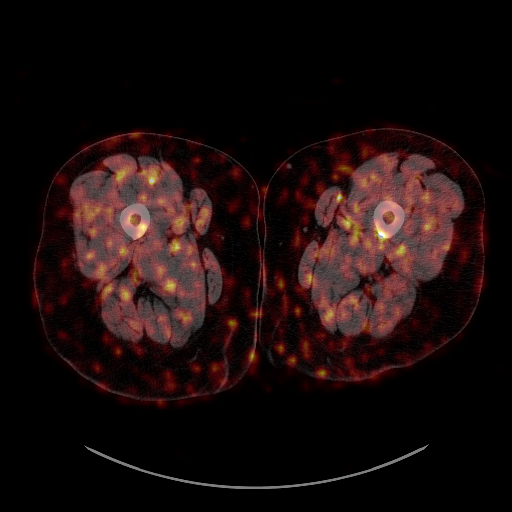
[im 62/245]
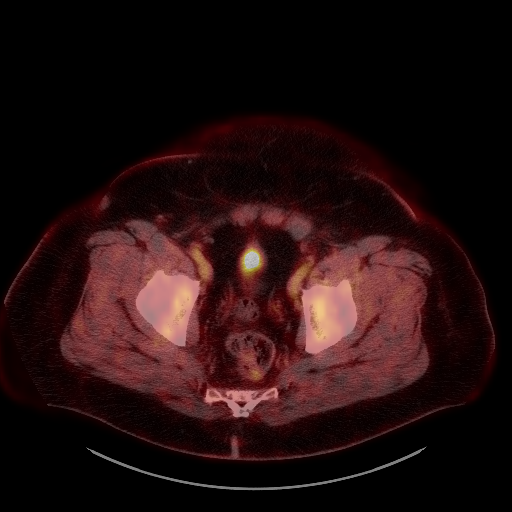
[im 123/245]
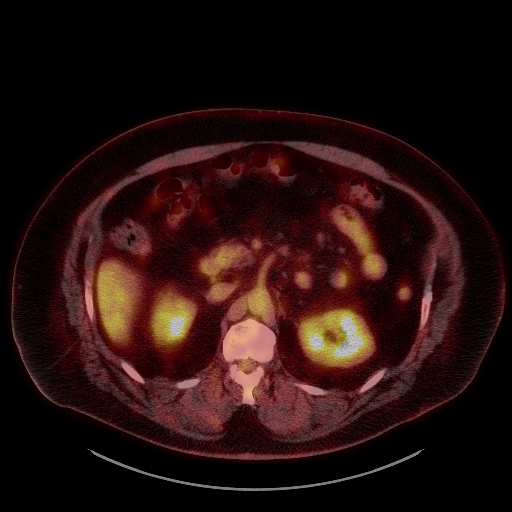
[im 184/245]
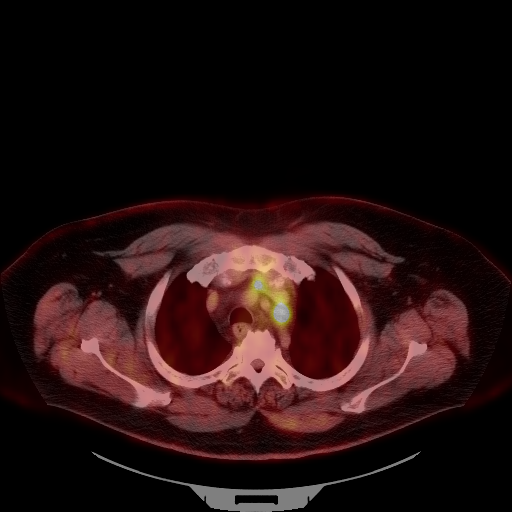
[im 245/245]
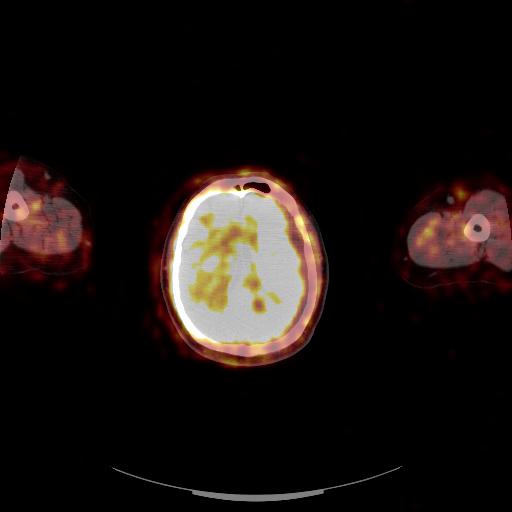

[25 of 25 positions shown; findings below may reference images not displayed]

FINDINGS: Mediastinal blood pool activity: SUV max

Liver activity: SUV max

NECK: There is a large nodal mass within the left cervical chain
extending from the level 2 region through level 4 and 5. This
measures the 9.6 x 8.5 by 13.9 cm and has an SUV max 26.47, image
43/4. The tumor extends into the superior mediastinum along with the
left paratracheal region, image 56/4. There is also tumor extension
into the prevertebral soft tissue space at the level of the
oropharynx with involvement of the right tonsillar fossa. Tumor also
extends into the prevertebral soft tissues at the level of the
hypopharynx with mass effect upon the hypopharynx with deviation
towards the right.

Incidental CT findings: none

CHEST: Mild FDG uptake associated with 9 mm subcarinal lymph node
within SUV max of 3.68, image 83/4. No FDG avid pulmonary nodule.

Incidental CT findings: Mild aortic atherosclerosis and coronary
artery calcifications. No pleural effusion.

ABDOMEN/PELVIS: No abnormal FDG uptake identified within the liver.
Within the neck of pancreas there is a approximately 1.4 cm focus of
increased uptake with SUV max of 7.88. Within the body of the
pancreas there is a focus of increased uptake measuring 2 cm with
SUV max of 14.0. Normal size spleen without focal areas of increased
uptake. Adrenal glands appear normal. No FDG avid lymph nodes within
the abdomen or pelvis. FDG avid right inguinal node measures 7 mm
within SUV max of 6.96, image 199/4.

Incidental CT findings: Large fat containing umbilical hernia
measures 8.0 x 6.3 cm. Small bilateral fat containing inguinal
hernias.

SKELETON: There are a few scattered FDG avid bone lesions, without
corresponding CT changes. For example, within the left humeral head
there is a moderate to intense focus of increased uptake within SUV
max of 9.87. Within the transverse process the T10 vertebra on the
left there is a focus of increased uptake within SUV max of 8.85.
Within the inferior endplate of the L2 vertebral body there is a
small focus of increased uptake with SUV max of 8.27.

Incidental CT findings: none
IMPRESSION: 1. Examination is positive for large FDG avid mass within the left
neck extending from the level 2 nodal chain into the left side of
the superior mediastinum compatible with metabolically active tumor.
2. There are 2 foci of increased uptake within the neck and body of
pancreas lymphomatous involvement of the pancreas.
3. Subcentimeter FDG avid right inguinal lymph node also concerning
for lymphoma.
4. Several foci of increased uptake within the axial skeleton
compatible with malignancy.

## 2023-01-03 IMAGING — XA IR PICC >5YO
1 series · 1 of 1 positions shown · non-contrast
Comparison: none

INDICATION: Recent diagnosis of lymphoma. Request made for placement of a PICC
line for the initiation of chemotherapy
TECHNIQUE: The procedure, risks, benefits, and alternatives were explained to
the patient's niece and informed written consent was obtained. A
timeout was performed prior to the initiation of the procedure.

[Series 1: ir picc place right>5 yr inc img · 1 of 1 slices shown]
[im 1/1]
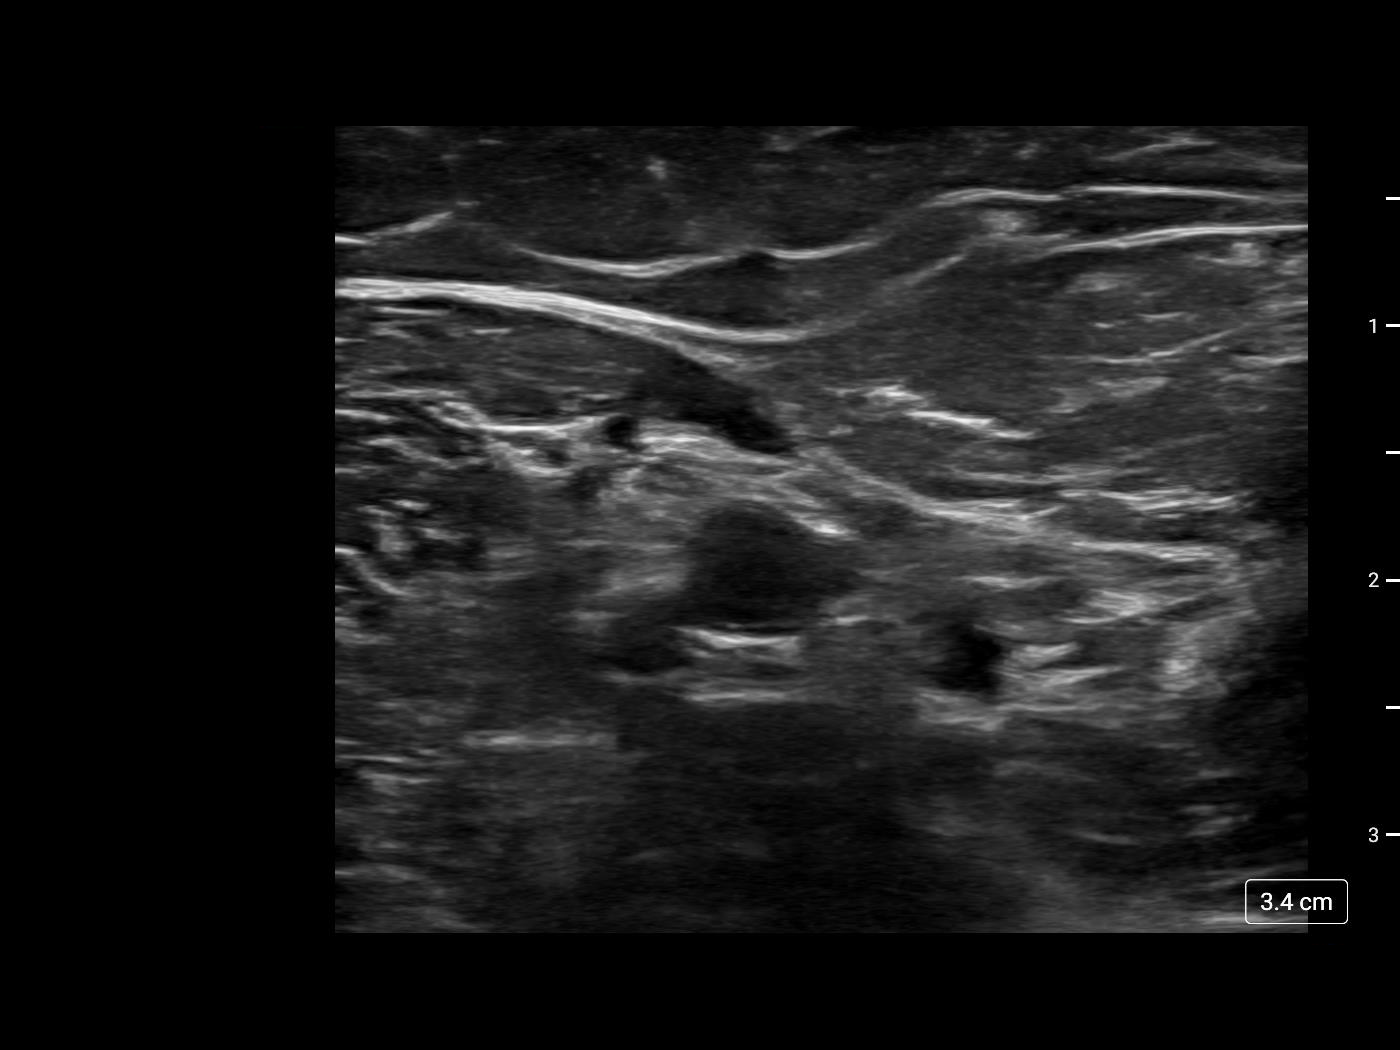

[1 of 1 positions shown; findings below may reference images not displayed]

Note, initial plan was to proceed with port a catheter placement
however the patient has recently been diagnosed with DVT and
pulmonary embolism and referring oncologist has requested a PICC
line initially as the patient is begun on anticoagulation.

EXAM:
ULTRASOUND AND FLUOROSCOPIC GUIDED PICC LINE INSERTION

MEDICATIONS:
None.

CONTRAST:  None

FLUOROSCOPY TIME:  18 seconds (2 mGy)

COMPLICATIONS:
None immediate.
The right upper extremity was prepped with chlorhexidine in a
sterile fashion, and a sterile drape was applied covering the
operative field. Maximum barrier sterile technique with sterile
gowns and gloves were used for the procedure. A timeout was
performed prior to the initiation of the procedure. Local anesthesia
was provided with 1% lidocaine.

Under direct ultrasound guidance, the brachial vein was accessed
with a micropuncture kit after the overlying soft tissues were
anesthetized with 1% lidocaine. Real-time ultrasound guidance was
utilized for vascular access including the acquisition of a
permanent ultrasound image documenting patency of the accessed
vessel.

A guidewire was advanced to the level of the superior caval-atrial
junction for measurement purposes and the PICC line was cut to
length. A peel-away sheath was placed and a 40 cm, 5 French, dual
lumen was inserted to level of the superior caval-atrial junction. A
post procedure spot fluoroscopic was obtained. The catheter easily
aspirated and flushed and was secured in place with stat lock
device. A dressing was applied. The patient tolerated the procedure
well without immediate post procedural complication.
FINDINGS: After catheter placement, the tip lies within the superior
cavoatrial junction. The catheter aspirates and flushes normally and
is ready for immediate use.
IMPRESSION: Successful ultrasound and fluoroscopic guided placement of a right
brachial vein approach, 40 cm, 5 French, dual lumen PICC with tip at
the superior caval-atrial junction. The PICC line is ready for
immediate use.

## 2023-01-08 DIAGNOSIS — G4733 Obstructive sleep apnea (adult) (pediatric): Secondary | ICD-10-CM | POA: Diagnosis not present

## 2023-01-13 ENCOUNTER — Other Ambulatory Visit: Payer: Self-pay | Admitting: Hematology

## 2023-02-03 DIAGNOSIS — R350 Frequency of micturition: Secondary | ICD-10-CM | POA: Diagnosis not present

## 2023-02-03 DIAGNOSIS — N390 Urinary tract infection, site not specified: Secondary | ICD-10-CM | POA: Diagnosis not present

## 2023-02-06 ENCOUNTER — Other Ambulatory Visit: Payer: Self-pay | Admitting: Orthopedic Surgery

## 2023-02-08 DIAGNOSIS — G4733 Obstructive sleep apnea (adult) (pediatric): Secondary | ICD-10-CM | POA: Diagnosis not present

## 2023-02-27 ENCOUNTER — Other Ambulatory Visit: Payer: Self-pay

## 2023-02-27 DIAGNOSIS — C8221 Follicular lymphoma grade III, unspecified, lymph nodes of head, face, and neck: Secondary | ICD-10-CM

## 2023-03-07 ENCOUNTER — Inpatient Hospital Stay (HOSPITAL_BASED_OUTPATIENT_CLINIC_OR_DEPARTMENT_OTHER): Payer: 59 | Admitting: Hematology

## 2023-03-07 ENCOUNTER — Inpatient Hospital Stay: Payer: 59 | Attending: Hematology

## 2023-03-07 ENCOUNTER — Other Ambulatory Visit: Payer: Self-pay

## 2023-03-07 VITALS — BP 125/75 | HR 82 | Temp 98.0°F | Resp 18 | Wt 284.7 lb

## 2023-03-07 DIAGNOSIS — G4733 Obstructive sleep apnea (adult) (pediatric): Secondary | ICD-10-CM | POA: Insufficient documentation

## 2023-03-07 DIAGNOSIS — C8288 Other types of follicular lymphoma, lymph nodes of multiple sites: Secondary | ICD-10-CM | POA: Diagnosis not present

## 2023-03-07 DIAGNOSIS — C8221 Follicular lymphoma grade III, unspecified, lymph nodes of head, face, and neck: Secondary | ICD-10-CM

## 2023-03-07 DIAGNOSIS — F32A Depression, unspecified: Secondary | ICD-10-CM | POA: Diagnosis not present

## 2023-03-07 DIAGNOSIS — F419 Anxiety disorder, unspecified: Secondary | ICD-10-CM | POA: Insufficient documentation

## 2023-03-07 DIAGNOSIS — Z86711 Personal history of pulmonary embolism: Secondary | ICD-10-CM | POA: Diagnosis not present

## 2023-03-07 DIAGNOSIS — Z79899 Other long term (current) drug therapy: Secondary | ICD-10-CM | POA: Diagnosis not present

## 2023-03-07 DIAGNOSIS — I10 Essential (primary) hypertension: Secondary | ICD-10-CM | POA: Insufficient documentation

## 2023-03-07 LAB — CMP (CANCER CENTER ONLY)
ALT: 13 U/L (ref 0–44)
AST: 18 U/L (ref 15–41)
Albumin: 4.2 g/dL (ref 3.5–5.0)
Alkaline Phosphatase: 43 U/L (ref 38–126)
Anion gap: 6 (ref 5–15)
BUN: 20 mg/dL (ref 8–23)
CO2: 27 mmol/L (ref 22–32)
Calcium: 9.2 mg/dL (ref 8.9–10.3)
Chloride: 107 mmol/L (ref 98–111)
Creatinine: 1.47 mg/dL — ABNORMAL HIGH (ref 0.61–1.24)
GFR, Estimated: 53 mL/min — ABNORMAL LOW (ref >60)
Glucose, Bld: 113 mg/dL — ABNORMAL HIGH (ref 70–99)
Potassium: 4.1 mmol/L (ref 3.5–5.1)
Sodium: 140 mmol/L (ref 135–145)
Total Bilirubin: 0.5 mg/dL (ref 0.3–1.2)
Total Protein: 7.1 g/dL (ref 6.5–8.1)

## 2023-03-07 LAB — CBC WITH DIFFERENTIAL (CANCER CENTER ONLY)
Abs Immature Granulocytes: 0.01 10*3/uL (ref 0.00–0.07)
Basophils Absolute: 0.1 10*3/uL (ref 0.0–0.1)
Basophils Relative: 1 %
Eosinophils Absolute: 0.3 10*3/uL (ref 0.0–0.5)
Eosinophils Relative: 5 %
HCT: 39.6 % (ref 39.0–52.0)
Hemoglobin: 13.6 g/dL (ref 13.0–17.0)
Immature Granulocytes: 0 %
Lymphocytes Relative: 31 %
Lymphs Abs: 1.8 10*3/uL (ref 0.7–4.0)
MCH: 31.1 pg (ref 26.0–34.0)
MCHC: 34.3 g/dL (ref 30.0–36.0)
MCV: 90.6 fL (ref 80.0–100.0)
Monocytes Absolute: 0.5 10*3/uL (ref 0.1–1.0)
Monocytes Relative: 10 %
Neutro Abs: 3 10*3/uL (ref 1.7–7.7)
Neutrophils Relative %: 53 %
Platelet Count: 206 10*3/uL (ref 150–400)
RBC: 4.37 MIL/uL (ref 4.22–5.81)
RDW: 15.9 % — ABNORMAL HIGH (ref 11.5–15.5)
WBC Count: 5.6 10*3/uL (ref 4.0–10.5)
nRBC: 0 % (ref 0.0–0.2)

## 2023-03-07 LAB — LACTATE DEHYDROGENASE: LDH: 121 U/L (ref 98–192)

## 2023-03-07 NOTE — Progress Notes (Signed)
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 Patient was last seen by me on 07/03/2022 and was doing well overall with no new medical concerns.  Today, he reports that he has been feeling well overall. He denies any fevers, chills, night sweats, new lumps/bumps chest, SOB, or abdominal pain.  He does report endorsing mild leg swelling after recent travel to [place]. Patient has no leg swelling at this time.   He reports that his cholesterol medication has been changed, but he is unsure of the name of his new medication. He does take fish oil regularly.  Patient confirms having a hx of gout and continues to take Allopurinol on a regular basis.   He reports endorsing a bladder infection 1 month ago. He reports a small nodule on neck which has been stable for a year.  MEDICAL HISTORY:  Past Medical History:  Diagnosis Date   Anxiety    Arthritis    COVID    x 2 (2020 and 2022)   Depression    Follicular lymphoma (HCC)    Hypertension    Left leg DVT (HCC) 07/2021   Pulmonary embolism (HCC) 07/2021    SURGICAL HISTORY: Past Surgical History:  Procedure Laterality Date   carpel tunnel     IR IMAGING GUIDED PORT INSERTION  10/10/2021   IR REMOVAL TUN ACCESS W/ PORT W/O FL MOD SED  11/26/2022   MASS BIOPSY Left 07/14/2021   Procedure: NECK MASS  BIOPSY;  Surgeon: Christia Reading, MD;  Location: [facility] OR;  Service: ENT;  Laterality: Left;    SOCIAL HISTORY: Social History   Socioeconomic History   Marital status: Widowed    Spouse name: Not on file   Number of children: Not on file   Years of education: Not on file   Highest education level: Not on file  Occupational History   Not on file  Tobacco Use   Smoking status: Former    Packs/day: .25    Types: Cigarettes    Quit date: 11/01/1986    Years since quitting: 36.3   Smokeless tobacco: Former    Types: Chew    Quit date: 11/28/1987  Vaping Use   Vaping Use: Never used  Substance and Sexual Activity   Alcohol use: Yes    Comment: rare   Drug use: No   Sexual activity: Not Currently  Other Topics Concern   Not on file  Social History Narrative   Not on file   Social Determinants of Health   Financial Resource Strain: Not on file  Food Insecurity: Not on file  Transportation Needs: Not on file  Physical Activity: Not on file  Stress: Not on file  Social Connections: Not on file  Intimate Partner Violence: Not on file    FAMILY HISTORY: Family History  Problem Relation Age of Onset   Healthy Mother    Healthy Father    Throat cancer Brother  ALLERGIES:  has No Known Allergies.  MEDICATIONS:  Current Outpatient Medications  Medication Sig Dispense Refill   acetaminophen (TYLENOL) 325 MG tablet Take 2 tablets (650 mg total) by mouth every 6 (six) hours as needed for mild pain (or Fever >/= 101). 20 tablet 0   albuterol (VENTOLIN HFA) 108 (90 Base) MCG/ACT inhaler Inhale 2 puffs into the lungs every 6 (six) hours as needed for wheezing or shortness of breath. (Patient not taking: Reported on 11/04/2022)     allopurinol (ZYLOPRIM) 300 MG tablet TAKE 1 TABLET BY MOUTH EVERY DAY 30 tablet 5   ALPRAZolam (XANAX) 1 MG tablet Take 1 mg by mouth 3 (three) times daily as needed.     aspirin EC 81 MG tablet Take 1 tablet (81 mg total) by mouth daily. Swallow whole.  Start after port a cath removal and discontinuation of your Eliquis. 30 tablet 12   feeding supplement (ENSURE ENLIVE / ENSURE PLUS) LIQD Take 237 mLs by mouth 2 (two) times daily between meals. 237 mL 12   HYDROcodone-acetaminophen (HYCET) 7.5-325 mg/15 ml solution Take 10 mLs by mouth 4 (four) times daily as needed for moderate pain. (Patient not taking: Reported on 07/03/2022) 120 mL 0   lidocaine (XYLOCAINE) 2 % solution Patient: Mix 1part 2% viscous lidocaine, 1part H20. Swallow 10mL of diluted mixture, before meals and at bedtime, up to QID (Patient not taking: Reported on 07/03/2022) 200 mL 2   lidocaine-prilocaine (EMLA) cream Apply 1 application topically as needed. 30-60 mins prior to port a cath access. Start using 2 weeks after port a cath placement. 30 g 0   Multiple Vitamin (ONE-A-DAY MENS PO) Take by mouth.     Omega-3 Fatty Acids (FISH OIL) 1200 MG CAPS Take 1 capsule by mouth daily.     ondansetron (ZOFRAN) 8 MG tablet Take 1 tablet (8 mg total) by mouth 2 (two) times daily as needed for refractory nausea / vomiting. Start on day 3 after cyclophosphamide chemotherapy. (Patient not taking: Reported on 07/03/2022) 30 tablet 1   sertraline (ZOLOFT) 50 MG tablet Take 50 mg by mouth daily.     sodium chloride flush (NS) 0.9 % SOLN 10-40 mLs by Intracatheter route as needed (flush). 1000 mL 0   Vitamin D, Ergocalciferol, (DRISDOL) 1.25 MG (50000 UNIT) CAPS capsule TAKE ONE CAPSULE BY MOUTH ONCE a WEEK 12 capsule 1   No current facility-administered medications for this visit.    REVIEW OF SYSTEMS:    10 Point review of Systems was done is negative except as noted above.   PHYSICAL EXAMINATION: ECOG PERFORMANCE STATUS: 1 - Symptomatic but completely ambulatory .There were no vitals taken for this visit.   GENERAL:alert, in no acute distress and comfortable SKIN: no acute rashes, no significant lesions EYES: conjunctiva are pink and non-injected, sclera anicteric OROPHARYNX:  MMM, no exudates, no oropharyngeal erythema or ulceration NECK: supple, no JVD LYMPH:  no palpable lymphadenopathy in the cervical, axillary or inguinal regions LUNGS: clear to auscultation b/l with normal respiratory effort HEART: regular rate & rhythm ABDOMEN:  normoactive bowel sounds , non tender, not distended. Extremity: no pedal edema PSYCH: alert & oriented x 3 with fluent speech NEURO: no focal motor/sensory deficits   LABORATORY DATA:  I have reviewed the data as listed .    Latest Ref Rng & Units 11/04/2022    1:22 PM 07/03/2022   12:59 PM 04/02/2022   12:19 PM  CBC  WBC 4.0 - 10.5 K/uL 6.6  5.1  5.9   Hemoglobin 13.0 - 17.0 g/dL 16.1  09.6  04.5   Hematocrit 39.0 - 52.0 % 42.8  41.3  37.4   Platelets 150 - 400 K/uL 190  191  368     .    Latest Ref Rng & Units 11/04/2022    1:22 PM 10/25/2022   12:44 PM 07/03/2022   12:59 PM  CMP  Glucose 70 - 99 mg/dL 92   409   BUN 8 - 23 mg/dL 18   15   Creatinine 8.11 - 1.24 mg/dL 9.14  7.82  9.56   Sodium 135 - 145 mmol/L 139   138   Potassium 3.5 - 5.1 mmol/L 4.2   4.2   Chloride 98 - 111 mmol/L 104   103   CO2 22 - 32 mmol/L 30   31   Calcium 8.9 - 10.3 mg/dL 9.9   9.5   Total Protein 6.5 - 8.1 g/dL 7.3   7.0   Total Bilirubin 0.3 - 1.2 mg/dL 0.6   0.5   Alkaline Phos 38 - 126 U/L 63   61   AST 15 - 41 U/L 19   18   ALT 0 - 44 U/L 14   14     . Lab Results  Component Value Date   LDH 126 11/04/2022    SURGICAL PATHOLOGY  CASE: [id number]  PATIENT: Joe [name]  Surgical Pathology Report   Clinical History: neck mass (cm)   FINAL MICROSCOPIC DIAGNOSIS:   A. SOFT TISSUE MASS, LEFT NECK, BIOPSY:  - Non-Hodgkin B-cell lymphoma, see comment.   COMMENT:   The majority of the fragments have extensive crush artifact hampering  evaluation. One fragment is well preserved and demonstrates an atypical  lymphoid proliferation with predominately small irregular lymphocytes.  Immunohistochemistry is positive for  CD20, bcl-2, bcl-6 and CD10. CD23  is largely negative. CyclinD1 and light chain in situ hybridization are  negative. Ki-67 is 20-25%. CD3 and CD5 highlight scattered T-cells. Flow  cytometry ([id number]) reveals predominately T-cells. Overall, the  findings are consistent with a non-Hodgkin B-cell lymphoma, specifically  follicular lymphoma. There is only limited preserved tissue hampering  grading, but this area appears low grade (grade 1-2 of 3). A higher  grade process in the crushed fragments cannot be excluded. Clinical  correlation is recommended. Dr. Jenne Pane was attempted on 07/18/2021.   RADIOGRAPHIC STUDIES: I have personally reviewed the radiological images as listed and agreed with the findings in the report.  No results found.  ASSESSMENT & PLAN:   67 y.o.  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 Based on limited sampling the patient's pathology reveals a grade 1 through 2 follicular lymphoma with a Ki-67 of 25%.  There was a lot of crush artifact in the sample and therefore a high-grade process could not be ruled out. However the predominant element of his mass has grown significantly over the last 1 month which is concerning for a high-grade follicular lymphoma or follicular lymphoma with transformation to large B-cell lymphoma.  CT neck imaging shows concern with compression of left internal jugular vein with mild mass-effect on the airway in the neck and the left carotid is inseparable from the mass.  High risk of syncopal episodes if there is compression of the common carotid baroreceptors and left vagus nerve  PET/CT 08/10/2021 showed  1. Examination is positive for large FDG avid mass within  the left neck extending from the level 2 nodal chain into the left side of the superior mediastinum compatible with metabolically active tumor. 2. There are 2 foci of increased uptake within the neck  and body of pancreas lymphomatous involvement of the pancreas. 3. Subcentimeter FDG avid right inguinal lymph node also concerning for lymphoma. 4. Several foci of increased uptake within the axial skeleton compatible with malignancy  2) HTN  3) Obstructive sleep apnea not on CPAP machine.  This is causing sleep issues and possible cognitive issues.  4) Depression and anxiety  5) Acute pulmonary embolism due to lymphoma plus body habitus plus possible vascular compression - on lovenox  6) PICC line in situ  PLAN:  -Discussed lab results on 03/07/2023 in detail with patient. CBC normal, showed WBC of 5.6K, hemoglobin of 13.6, and platelets of 206K. -CMP normal -LDH normal -10/25/2022 Ct neck, chest/abd/pelvis scans showed normal findings -no lab, clinical or radiographic evidence of lymphoma recurrence/progression at this time. -No indication for additional chemoimmunotherapy at this time. -small nodule on neck may be an Epidermoid cyst. Advised patient to continue to monitor it for any changes.  FOLLOW-UP: RTC with Dr Candise Che with labs in 4 months  The total time spent in the appointment was *** minutes* .  All of the patient's questions were answered with apparent satisfaction. The patient knows to call the clinic with any problems, questions or concerns.   Wyvonnia Lora MD MS AAHIVMS [facility] Hematology/Oncology Physician [facility]  .*Total Encounter Time as defined by the Centers for Medicare and Medicaid Services includes, in addition to the face-to-face time of a patient visit (documented in the note above) non-face-to-face time: obtaining and reviewing outside history, ordering and reviewing medications, tests or procedures, care coordination (communications with other health care professionals or caregivers) and documentation in the medical record.    I,Mitra Faeizi,acting as a Neurosurgeon for Wyvonnia Lora, MD.,have documented all relevant documentation on the behalf of  Wyvonnia Lora, MD,as directed by  Wyvonnia Lora, MD while in the presence of Wyvonnia Lora, MD.  ***

## 2023-03-10 ENCOUNTER — Telehealth: Payer: Self-pay | Admitting: Hematology

## 2023-03-10 DIAGNOSIS — G4733 Obstructive sleep apnea (adult) (pediatric): Secondary | ICD-10-CM | POA: Diagnosis not present

## 2023-03-13 ENCOUNTER — Encounter: Payer: Self-pay | Admitting: Hematology

## 2023-04-04 DIAGNOSIS — E559 Vitamin D deficiency, unspecified: Secondary | ICD-10-CM | POA: Diagnosis not present

## 2023-04-04 DIAGNOSIS — I10 Essential (primary) hypertension: Secondary | ICD-10-CM | POA: Diagnosis not present

## 2023-04-04 DIAGNOSIS — Z87891 Personal history of nicotine dependence: Secondary | ICD-10-CM | POA: Diagnosis not present

## 2023-04-04 DIAGNOSIS — Z008 Encounter for other general examination: Secondary | ICD-10-CM | POA: Diagnosis not present

## 2023-04-04 DIAGNOSIS — M1990 Unspecified osteoarthritis, unspecified site: Secondary | ICD-10-CM | POA: Diagnosis not present

## 2023-04-04 DIAGNOSIS — F324 Major depressive disorder, single episode, in partial remission: Secondary | ICD-10-CM | POA: Diagnosis not present

## 2023-04-04 DIAGNOSIS — Z6841 Body Mass Index (BMI) 40.0 and over, adult: Secondary | ICD-10-CM | POA: Diagnosis not present

## 2023-04-04 DIAGNOSIS — Z7982 Long term (current) use of aspirin: Secondary | ICD-10-CM | POA: Diagnosis not present

## 2023-04-04 DIAGNOSIS — M109 Gout, unspecified: Secondary | ICD-10-CM | POA: Diagnosis not present

## 2023-04-04 DIAGNOSIS — G4733 Obstructive sleep apnea (adult) (pediatric): Secondary | ICD-10-CM | POA: Diagnosis not present

## 2023-04-04 DIAGNOSIS — E785 Hyperlipidemia, unspecified: Secondary | ICD-10-CM | POA: Diagnosis not present

## 2023-04-04 DIAGNOSIS — F419 Anxiety disorder, unspecified: Secondary | ICD-10-CM | POA: Diagnosis not present

## 2023-04-12 DIAGNOSIS — G4733 Obstructive sleep apnea (adult) (pediatric): Secondary | ICD-10-CM | POA: Diagnosis not present

## 2023-04-23 DIAGNOSIS — N390 Urinary tract infection, site not specified: Secondary | ICD-10-CM | POA: Diagnosis not present

## 2023-04-23 DIAGNOSIS — R350 Frequency of micturition: Secondary | ICD-10-CM | POA: Diagnosis not present

## 2023-05-12 DIAGNOSIS — G4733 Obstructive sleep apnea (adult) (pediatric): Secondary | ICD-10-CM | POA: Diagnosis not present

## 2023-05-14 DIAGNOSIS — E782 Mixed hyperlipidemia: Secondary | ICD-10-CM | POA: Diagnosis not present

## 2023-05-14 DIAGNOSIS — E119 Type 2 diabetes mellitus without complications: Secondary | ICD-10-CM | POA: Diagnosis not present

## 2023-05-20 DIAGNOSIS — M109 Gout, unspecified: Secondary | ICD-10-CM | POA: Diagnosis not present

## 2023-05-20 DIAGNOSIS — D649 Anemia, unspecified: Secondary | ICD-10-CM | POA: Diagnosis not present

## 2023-05-20 DIAGNOSIS — Z8572 Personal history of non-Hodgkin lymphomas: Secondary | ICD-10-CM | POA: Diagnosis not present

## 2023-05-20 DIAGNOSIS — E119 Type 2 diabetes mellitus without complications: Secondary | ICD-10-CM | POA: Diagnosis not present

## 2023-05-20 DIAGNOSIS — F5104 Psychophysiologic insomnia: Secondary | ICD-10-CM | POA: Diagnosis not present

## 2023-05-20 DIAGNOSIS — E782 Mixed hyperlipidemia: Secondary | ICD-10-CM | POA: Diagnosis not present

## 2023-05-20 DIAGNOSIS — K429 Umbilical hernia without obstruction or gangrene: Secondary | ICD-10-CM | POA: Diagnosis not present

## 2023-05-20 DIAGNOSIS — I10 Essential (primary) hypertension: Secondary | ICD-10-CM | POA: Diagnosis not present

## 2023-05-20 DIAGNOSIS — H9209 Otalgia, unspecified ear: Secondary | ICD-10-CM | POA: Diagnosis not present

## 2023-05-20 DIAGNOSIS — R059 Cough, unspecified: Secondary | ICD-10-CM | POA: Diagnosis not present

## 2023-05-20 DIAGNOSIS — F329 Major depressive disorder, single episode, unspecified: Secondary | ICD-10-CM | POA: Diagnosis not present

## 2023-06-12 DIAGNOSIS — G4733 Obstructive sleep apnea (adult) (pediatric): Secondary | ICD-10-CM | POA: Diagnosis not present

## 2023-07-04 ENCOUNTER — Other Ambulatory Visit: Payer: Self-pay | Admitting: Hematology

## 2023-07-10 ENCOUNTER — Other Ambulatory Visit: Payer: Self-pay

## 2023-07-10 DIAGNOSIS — C8221 Follicular lymphoma grade III, unspecified, lymph nodes of head, face, and neck: Secondary | ICD-10-CM

## 2023-07-11 ENCOUNTER — Inpatient Hospital Stay (HOSPITAL_BASED_OUTPATIENT_CLINIC_OR_DEPARTMENT_OTHER): Payer: 59 | Admitting: Hematology

## 2023-07-11 ENCOUNTER — Inpatient Hospital Stay: Payer: 59 | Attending: Hematology

## 2023-07-11 DIAGNOSIS — F418 Other specified anxiety disorders: Secondary | ICD-10-CM | POA: Diagnosis not present

## 2023-07-11 DIAGNOSIS — Z87891 Personal history of nicotine dependence: Secondary | ICD-10-CM | POA: Insufficient documentation

## 2023-07-11 DIAGNOSIS — C8221 Follicular lymphoma grade III, unspecified, lymph nodes of head, face, and neck: Secondary | ICD-10-CM

## 2023-07-11 DIAGNOSIS — Z79899 Other long term (current) drug therapy: Secondary | ICD-10-CM | POA: Insufficient documentation

## 2023-07-11 DIAGNOSIS — I10 Essential (primary) hypertension: Secondary | ICD-10-CM | POA: Diagnosis not present

## 2023-07-11 DIAGNOSIS — C8288 Other types of follicular lymphoma, lymph nodes of multiple sites: Secondary | ICD-10-CM | POA: Diagnosis present

## 2023-07-11 DIAGNOSIS — Z86711 Personal history of pulmonary embolism: Secondary | ICD-10-CM | POA: Diagnosis not present

## 2023-07-11 DIAGNOSIS — Z7901 Long term (current) use of anticoagulants: Secondary | ICD-10-CM | POA: Insufficient documentation

## 2023-07-11 DIAGNOSIS — Z7982 Long term (current) use of aspirin: Secondary | ICD-10-CM | POA: Diagnosis not present

## 2023-07-11 LAB — CBC WITH DIFFERENTIAL (CANCER CENTER ONLY)
Abs Immature Granulocytes: 0.02 10*3/uL (ref 0.00–0.07)
Basophils Absolute: 0.1 10*3/uL (ref 0.0–0.1)
Basophils Relative: 1 %
Eosinophils Absolute: 0.2 10*3/uL (ref 0.0–0.5)
Eosinophils Relative: 3 %
HCT: 42 % (ref 39.0–52.0)
Hemoglobin: 14.3 g/dL (ref 13.0–17.0)
Immature Granulocytes: 0 %
Lymphocytes Relative: 31 %
Lymphs Abs: 1.8 10*3/uL (ref 0.7–4.0)
MCH: 32.1 pg (ref 26.0–34.0)
MCHC: 34 g/dL (ref 30.0–36.0)
MCV: 94.2 fL (ref 80.0–100.0)
Monocytes Absolute: 0.4 10*3/uL (ref 0.1–1.0)
Monocytes Relative: 7 %
Neutro Abs: 3.1 10*3/uL (ref 1.7–7.7)
Neutrophils Relative %: 58 %
Platelet Count: 210 10*3/uL (ref 150–400)
RBC: 4.46 MIL/uL (ref 4.22–5.81)
RDW: 14.8 % (ref 11.5–15.5)
WBC Count: 5.6 10*3/uL (ref 4.0–10.5)
nRBC: 0 % (ref 0.0–0.2)

## 2023-07-11 LAB — CMP (CANCER CENTER ONLY)
ALT: 14 U/L (ref 0–44)
AST: 21 U/L (ref 15–41)
Albumin: 4.5 g/dL (ref 3.5–5.0)
Alkaline Phosphatase: 37 U/L — ABNORMAL LOW (ref 38–126)
Anion gap: 7 (ref 5–15)
BUN: 25 mg/dL — ABNORMAL HIGH (ref 8–23)
CO2: 29 mmol/L (ref 22–32)
Calcium: 9.9 mg/dL (ref 8.9–10.3)
Chloride: 103 mmol/L (ref 98–111)
Creatinine: 1.54 mg/dL — ABNORMAL HIGH (ref 0.61–1.24)
GFR, Estimated: 50 mL/min — ABNORMAL LOW (ref >60)
Glucose, Bld: 94 mg/dL (ref 70–99)
Potassium: 4.7 mmol/L (ref 3.5–5.1)
Sodium: 139 mmol/L (ref 135–145)
Total Bilirubin: 0.7 mg/dL (ref 0.3–1.2)
Total Protein: 7.8 g/dL (ref 6.5–8.1)

## 2023-07-11 LAB — LACTATE DEHYDROGENASE: LDH: 135 U/L (ref 98–192)

## 2023-07-11 NOTE — Progress Notes (Signed)
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 Patient was last seen by me on 03/07/2023 and reported a history of gout managed by Allopurinol and a stable small neck nodule present for 1 year.   Today, he is accompanied by his daughter. He denies any new lumps/bumps, SOB, chest pain, skin rashes, leg swelling, or back pain. Patient has stable energy levels. He denies any nocturia, fever, chills, or night sweats.  Patient reports that the degenerative changes in his neck are not significantly bothersome. Patient reports that the small epidermoid cyst in his neck is unchanged from previously.  He reports having an ear infection in his bilateral ears about one ago, which he reports has resolved. However, patient has limited hearing ability from his left ear at this time. He denies any ear drainage and does not tend to have allergies at this time of year.   Patient reports that he stays well hydrated. He generally consumes five 16-ounce bottles of water daily. He also regularly drinks milk and tea. Patient denies any issues passing urine. He is not on any diuretics at this time.  He reports having a bladder infection in July and was started on antibiotics. He denies any prostate issues. He notes that he has had two UTIs since completing chemotherapy 1.5 years ago.   He  reports that he currently takes his gout medication every other day and previously took it once daily. Patient denies any recent gout flares.   He denies having any history of kidney stones in the past. He reports that he quit smoking 40 years ago. Patient follows with his PCP every 3 months and last saw Dr. Margo Aye in July 2024.   MEDICAL HISTORY:  Past Medical History:  Diagnosis Date   Anxiety    Arthritis    COVID    x 2 (2020 and 2022)   Depression    Follicular lymphoma (HCC)    Hypertension    Left leg DVT (HCC) 07/2021   Pulmonary embolism (HCC) 07/2021    SURGICAL HISTORY: Past Surgical History:  Procedure Laterality Date   carpel tunnel     IR IMAGING GUIDED PORT INSERTION  10/10/2021   IR REMOVAL TUN ACCESS W/ PORT W/O FL MOD SED  11/26/2022   MASS BIOPSY Left 07/14/2021   Procedure: NECK MASS BIOPSY;  Surgeon: Christia Reading, MD;  Location: [facility] OR;  Service: ENT;  Laterality: Left;    SOCIAL HISTORY: Social History   Socioeconomic History   Marital status: Widowed    Spouse name: Not on file   Number of children: Not on file   Years of education: Not on file   Highest education level: Not on file  Occupational History   Not on file  Tobacco Use   Smoking status: Former    Current packs/day: 0.00    Types: Cigarettes    Quit date: 11/01/1986    Years since quitting: 36.7   Smokeless  tobacco: Former    Types: Chew    Quit date: 11/28/1987  Vaping Use   Vaping status: Never Used  Substance and Sexual Activity   Alcohol use: Yes    Comment: rare   Drug use: No   Sexual activity: Not Currently  Other Topics Concern   Not on file  Social History Narrative   Not on file   Social Determinants of Health   Financial Resource Strain: Not on file  Food Insecurity: Not on file  Transportation Needs: Not on file  Physical Activity: Not on file  Stress: Not on file  Social Connections: Not on file  Intimate Partner Violence: Not on file    FAMILY  HISTORY: Family History  Problem Relation Age of Onset   Healthy Mother    Healthy Father    Throat cancer Brother     ALLERGIES:  has No Known Allergies.  MEDICATIONS:  Current Outpatient Medications  Medication Sig Dispense Refill   acetaminophen (TYLENOL) 325 MG tablet Take 2 tablets (650 mg total) by mouth every 6 (six) hours as needed for mild pain (or Fever >/= 101). 20 tablet 0   albuterol (VENTOLIN HFA) 108 (90 Base) MCG/ACT inhaler Inhale 2 puffs into the lungs every 6 (six) hours as needed for wheezing or shortness of breath. (Patient not taking: Reported on 11/04/2022)     allopurinol (ZYLOPRIM) 300 MG tablet TAKE 1 TABLET BY MOUTH EVERY DAY 30 tablet 5   ALPRAZolam (XANAX) 1 MG tablet Take 1 mg by mouth 3 (three) times daily as needed.     aspirin EC 81 MG tablet Take 1 tablet (81 mg total) by mouth daily. Swallow whole. Start after port a cath removal and discontinuation of your Eliquis. 30 tablet 12   feeding supplement (ENSURE ENLIVE / ENSURE PLUS) LIQD Take 237 mLs by mouth 2 (two) times daily between meals. 237 mL 12   HYDROcodone-acetaminophen (HYCET) 7.5-325 mg/15 ml solution Take 10 mLs by mouth 4 (four) times daily as needed for moderate pain. (Patient not taking: Reported on 07/03/2022) 120 mL 0   lidocaine (XYLOCAINE) 2 % solution Patient: Mix 1part 2% viscous lidocaine, 1part H20. Swallow 10mL of diluted mixture, before meals and at bedtime, up to QID (Patient not taking: Reported on 07/03/2022) 200 mL 2   lidocaine-prilocaine (EMLA) cream Apply 1 application topically as needed. 30-60 mins prior to port a cath access. Start using 2 weeks after port a cath placement. 30 g 0   Multiple Vitamin (ONE-A-DAY MENS PO) Take by mouth.     Omega-3 Fatty Acids (FISH OIL) 1200 MG CAPS Take 1 capsule by mouth daily.     ondansetron (ZOFRAN) 8 MG tablet Take 1 tablet (8 mg total) by mouth 2 (two) times daily as needed for refractory nausea / vomiting. Start on day 3 after  cyclophosphamide chemotherapy. (Patient not taking: Reported on 07/03/2022) 30 tablet 1   sertraline (ZOLOFT) 50 MG tablet Take 50 mg by mouth daily.     sodium chloride flush (NS) 0.9 % SOLN 10-40 mLs by Intracatheter route as needed (flush). 1000 mL 0   Vitamin D, Ergocalciferol, 50000 units CAPS TAKE ONE CAPSULE BY MOUTH ONCE a WEEK 12 capsule 1   No current facility-administered medications for this visit.    REVIEW OF SYSTEMS:    10 Point review of Systems was done is negative except as noted above.   PHYSICAL EXAMINATION: ECOG PERFORMANCE STATUS: 1 - Symptomatic but completely ambulatory VSS  GENERAL:alert,  in no acute distress and comfortable SKIN: no acute rashes, no significant lesions EYES: conjunctiva are pink and non-injected, sclera anicteric OROPHARYNX: MMM, no exudates, no oropharyngeal erythema or ulceration NECK: supple, no JVD LYMPH:  no palpable lymphadenopathy in the cervical, axillary or inguinal regions LUNGS: clear to auscultation b/l with normal respiratory effort HEART: regular rate & rhythm ABDOMEN:  normoactive bowel sounds , non tender, not distended. Extremity: no pedal edema PSYCH: alert & oriented x 3 with fluent speech NEURO: no focal motor/sensory deficits   LABORATORY DATA:  I have reviewed the data as listed .    Latest Ref Rng & Units 07/11/2023    1:09 PM 03/07/2023   12:35 PM 11/04/2022    1:22 PM  CBC  WBC 4.0 - 10.5 K/uL 5.6  5.6  6.6   Hemoglobin 13.0 - 17.0 g/dL 40.9  81.1  91.4   Hematocrit 39.0 - 52.0 % 42.0  39.6  42.8   Platelets 150 - 400 K/uL 210  206  190     .    Latest Ref Rng & Units 07/11/2023    1:09 PM 03/07/2023   12:35 PM 11/04/2022    1:22 PM  CMP  Glucose 70 - 99 mg/dL 94  782  92   BUN 8 - 23 mg/dL 25  20  18    Creatinine 0.61 - 1.24 mg/dL 9.56  2.13  0.86   Sodium 135 - 145 mmol/L 139  140  139   Potassium 3.5 - 5.1 mmol/L 4.7  4.1  4.2   Chloride 98 - 111 mmol/L 103  107  104   CO2 22 - 32 mmol/L 29  27   30    Calcium 8.9 - 10.3 mg/dL 9.9  9.2  9.9   Total Protein 6.5 - 8.1 g/dL 7.8  7.1  7.3   Total Bilirubin 0.3 - 1.2 mg/dL 0.7  0.5  0.6   Alkaline Phos 38 - 126 U/L 37  43  63   AST 15 - 41 U/L 21  18  19    ALT 0 - 44 U/L 14  13  14      . Lab Results  Component Value Date   LDH 135 07/11/2023    SURGICAL PATHOLOGY  CASE: [id number]  PATIENT: Rigoberto Kabler  Surgical Pathology Report   Clinical History: neck mass (cm)   FINAL MICROSCOPIC DIAGNOSIS:   A. SOFT TISSUE MASS, LEFT NECK, BIOPSY:  - Non-Hodgkin B-cell lymphoma, see comment.   COMMENT:   The majority of the fragments have extensive crush artifact hampering  evaluation. One fragment is well preserved and demonstrates an atypical  lymphoid proliferation with predominately small irregular lymphocytes.  Immunohistochemistry is positive for CD20, bcl-2, bcl-6 and CD10. CD23  is largely negative. CyclinD1 and light chain in situ hybridization are  negative. Ki-67 is 20-25%. CD3 and CD5 highlight scattered T-cells. Flow  cytometry ([id number]) reveals predominately T-cells. Overall, the  findings are consistent with a non-Hodgkin B-cell lymphoma, specifically  follicular lymphoma. There is only limited preserved tissue hampering  grading, but this area appears low grade (grade 1-2 of 3). A higher  grade process in the crushed fragments cannot be excluded. Clinical  correlation is recommended. Dr. Jenne Pane was attempted on 07/18/2021.   RADIOGRAPHIC STUDIES: I have personally reviewed the radiological images as listed and agreed with the findings in the report.  No results found.  ASSESSMENT & PLAN:   67 y.o.  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 Based on limited sampling the patient's pathology reveals a grade 1 through 2 follicular lymphoma with a Ki-67 of 25%.  There was a lot of crush artifact in the sample and therefore a  high-grade process could not be ruled out. However the predominant element of his mass has grown significantly over the last 1 month which is concerning for a high-grade follicular lymphoma or follicular lymphoma with transformation to large B-cell lymphoma.  CT neck imaging shows concern with compression of left internal jugular vein with mild mass-effect on the airway in the neck and the left carotid is inseparable from the mass.  High risk of syncopal episodes if there is compression of the common carotid baroreceptors and left vagus nerve  PET/CT 08/10/2021 showed  1. Examination is positive for large FDG avid mass within the left neck extending from the level 2 nodal chain into the left side of the superior mediastinum compatible with metabolically active tumor. 2. There are 2 foci of increased uptake within the neck and body of pancreas lymphomatous involvement of the pancreas. 3. Subcentimeter FDG avid right inguinal lymph node also concerning for lymphoma. 4. Several foci of increased uptake within the axial skeleton compatible with malignancy  2) HTN  3) Obstructive sleep apnea not on CPAP machine.  This is causing sleep issues and possible cognitive issues.  4) Depression and anxiety  5) Acute pulmonary embolism due to lymphoma plus body habitus plus possible vascular compression - on lovenox  6) PICC line in situ  PLAN:  -Discussed lab results on 07/11/23 in detail with patient. CBC normal, showed WBC of 5.6K, hemoglobin of 14.3, and platelets of 210K. -CMP shows elevation in kidney numbers, which suggests dehydration. Creatinine level is 1.47 mg/dL today, previously 1.1 mg/dL. -no lab, clinical or radiographic evidence of lymphoma recurrence/progression at this time. -No indication for additional chemoimmunotherapy at this time. -ear symptoms are likely related to his infection. He may have some fluid in the ear.  -discussed option of using antihistamines such as Claritin or  Zyrtec or using steam to help clear any fluid in the ear  -Discussed that If his ear is painful or significantly bothersome, there is an option to be seen by ENT to place a temporary tube to allow any remaining fluid to drain out -educated patient on potential causes of frequent urinary infections -discussed that certain antibiotics may be bothering the kidneys -advised patient to follow up with his PCP, Dr. Ann Maki for evaluation of kidney numbers/prostate and to rule out any chronic obstruction affecting urine flow -discussed option to refer patient to nephrologist for further evaluation -recommend patient to stay UTD with age-appropriate vaccines including influenza, RSV, pneumonia, and COVID-19 booster if he chooses to -will monitor with labs in 4 months. After his next visit, may extend visits to once every 6 months  FOLLOW-UP: RTC with Dr Candise Che with labs in 4 months  The total time spent in the appointment was 25 minutes* .  All of the patient's questions were answered with apparent satisfaction. The patient knows to call the clinic with any problems, questions or concerns.   Wyvonnia Lora MD MS AAHIVMS [facility] Hematology/Oncology [facility]  .*Total Encounter Time as defined by the Centers for Medicare and Medicaid Services includes, in addition to the face-to-face time of a patient visit (documented in the note above) non-face-to-face time: obtaining and reviewing outside history, ordering and reviewing medications,  tests or procedures, care coordination (communications with other health care professionals or caregivers) and documentation in the medical record.    I,Mitra Faeizi,acting as a Neurosurgeon for Wyvonnia Lora, MD.,have documented all relevant documentation on the behalf of Wyvonnia Lora, MD,as directed by  Wyvonnia Lora, MD while in the presence of Wyvonnia Lora, MD.  .I have reviewed the above documentation for accuracy and completeness, and I agree with the  above. Johney Maine MD

## 2023-07-15 ENCOUNTER — Encounter: Payer: Self-pay | Admitting: Hematology

## 2023-08-04 ENCOUNTER — Other Ambulatory Visit: Payer: Self-pay | Admitting: Orthopedic Surgery

## 2023-08-04 MED ORDER — ALLOPURINOL 300 MG PO TABS: 300.0000 mg | ORAL_TABLET | Freq: Every day | ORAL | 5 refills | Status: DC

## 2023-11-07 ENCOUNTER — Encounter: Payer: Self-pay | Admitting: Hematology

## 2023-11-13 ENCOUNTER — Other Ambulatory Visit: Payer: Self-pay

## 2023-11-13 DIAGNOSIS — C8221 Follicular lymphoma grade III, unspecified, lymph nodes of head, face, and neck: Secondary | ICD-10-CM

## 2023-11-14 ENCOUNTER — Encounter: Payer: Self-pay | Admitting: Hematology

## 2023-11-14 ENCOUNTER — Inpatient Hospital Stay (HOSPITAL_BASED_OUTPATIENT_CLINIC_OR_DEPARTMENT_OTHER): Payer: Medicare Other | Admitting: Hematology

## 2023-11-14 ENCOUNTER — Inpatient Hospital Stay: Payer: Medicare Other | Attending: Hematology

## 2023-11-14 VITALS — BP 136/79 | HR 105 | Temp 97.5°F | Resp 17 | Wt 289.7 lb

## 2023-11-14 DIAGNOSIS — Z79899 Other long term (current) drug therapy: Secondary | ICD-10-CM | POA: Insufficient documentation

## 2023-11-14 DIAGNOSIS — Z87891 Personal history of nicotine dependence: Secondary | ICD-10-CM | POA: Insufficient documentation

## 2023-11-14 DIAGNOSIS — Z8572 Personal history of non-Hodgkin lymphomas: Secondary | ICD-10-CM | POA: Diagnosis present

## 2023-11-14 DIAGNOSIS — I129 Hypertensive chronic kidney disease with stage 1 through stage 4 chronic kidney disease, or unspecified chronic kidney disease: Secondary | ICD-10-CM | POA: Diagnosis not present

## 2023-11-14 DIAGNOSIS — Z7901 Long term (current) use of anticoagulants: Secondary | ICD-10-CM | POA: Diagnosis not present

## 2023-11-14 DIAGNOSIS — Z86711 Personal history of pulmonary embolism: Secondary | ICD-10-CM | POA: Diagnosis not present

## 2023-11-14 DIAGNOSIS — C8221 Follicular lymphoma grade III, unspecified, lymph nodes of head, face, and neck: Secondary | ICD-10-CM

## 2023-11-14 DIAGNOSIS — Z7982 Long term (current) use of aspirin: Secondary | ICD-10-CM | POA: Diagnosis not present

## 2023-11-14 DIAGNOSIS — N189 Chronic kidney disease, unspecified: Secondary | ICD-10-CM | POA: Insufficient documentation

## 2023-11-14 DIAGNOSIS — Z86718 Personal history of other venous thrombosis and embolism: Secondary | ICD-10-CM | POA: Diagnosis not present

## 2023-11-14 LAB — CMP (CANCER CENTER ONLY)
ALT: 14 U/L (ref 0–44)
AST: 19 U/L (ref 15–41)
Albumin: 4.3 g/dL (ref 3.5–5.0)
Alkaline Phosphatase: 46 U/L (ref 38–126)
Anion gap: 6 (ref 5–15)
BUN: 23 mg/dL (ref 8–23)
CO2: 29 mmol/L (ref 22–32)
Calcium: 10.2 mg/dL (ref 8.9–10.3)
Chloride: 107 mmol/L (ref 98–111)
Creatinine: 1.44 mg/dL — ABNORMAL HIGH (ref 0.61–1.24)
GFR, Estimated: 54 mL/min — ABNORMAL LOW (ref >60)
Glucose, Bld: 94 mg/dL (ref 70–99)
Potassium: 4.3 mmol/L (ref 3.5–5.1)
Sodium: 142 mmol/L (ref 135–145)
Total Bilirubin: 0.4 mg/dL (ref 0.0–1.2)
Total Protein: 7.4 g/dL (ref 6.5–8.1)

## 2023-11-14 LAB — CBC WITH DIFFERENTIAL (CANCER CENTER ONLY)
Abs Immature Granulocytes: 0.02 10*3/uL (ref 0.00–0.07)
Basophils Absolute: 0.1 10*3/uL (ref 0.0–0.1)
Basophils Relative: 1 %
Eosinophils Absolute: 0.2 10*3/uL (ref 0.0–0.5)
Eosinophils Relative: 3 %
HCT: 42.6 % (ref 39.0–52.0)
Hemoglobin: 14.4 g/dL (ref 13.0–17.0)
Immature Granulocytes: 0 %
Lymphocytes Relative: 28 %
Lymphs Abs: 1.7 10*3/uL (ref 0.7–4.0)
MCH: 31.4 pg (ref 26.0–34.0)
MCHC: 33.8 g/dL (ref 30.0–36.0)
MCV: 93 fL (ref 80.0–100.0)
Monocytes Absolute: 0.5 10*3/uL (ref 0.1–1.0)
Monocytes Relative: 9 %
Neutro Abs: 3.7 10*3/uL (ref 1.7–7.7)
Neutrophils Relative %: 59 %
Platelet Count: 233 10*3/uL (ref 150–400)
RBC: 4.58 MIL/uL (ref 4.22–5.81)
RDW: 15 % (ref 11.5–15.5)
WBC Count: 6.2 10*3/uL (ref 4.0–10.5)
nRBC: 0 % (ref 0.0–0.2)

## 2023-11-14 LAB — LACTATE DEHYDROGENASE: LDH: 110 U/L (ref 98–192)

## 2023-11-14 NOTE — Progress Notes (Signed)
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 He noted having a bladder infection in July.  Today, he is accompanied by his daughter. He reports that he has been doing well overall since his last clinical visit.   He reports stable lumps in his neck and denies any new lumps/bumps in the groin or other area. Patient denies any fever, chills, night sweats, SOB, chest pain, abdominal pain/distention, infections, back pain, or abdominal pain.  He denies any major medication changes. His other medical issues have been stable.   He reports an episode of leg swelling after driving to [place], which was not persistent and has resolved since.   He reports that there has not been a concern for a UTI since his last clinical visit. Patient reports that he has not been seen by a urologist since his last visit. He was recently seen by his PCP and was started on a taper of Prednisone. Patient has not had any urinary issues since. He reports a strong stream and no new urinary symptoms.   Patient is not inclined to receive his age-appropriate vaccines.   MEDICAL HISTORY:  Past Medical History:  Diagnosis  Date   Anxiety    Arthritis    COVID    x 2 (2020 and 2022)   Depression    Follicular lymphoma (HCC)    Hypertension    Left leg DVT (HCC) 07/2021   Pulmonary embolism (HCC) 07/2021    SURGICAL HISTORY: Past Surgical History:  Procedure Laterality Date   carpel tunnel     IR IMAGING GUIDED PORT INSERTION  10/10/2021   IR REMOVAL TUN ACCESS W/ PORT W/O FL MOD SED  11/26/2022   MASS BIOPSY Left 07/14/2021   Procedure: NECK MASS BIOPSY;  Surgeon: Christia Reading, MD;  Location: [facility] OR;  Service: ENT;  Laterality: Left;    SOCIAL HISTORY: Social History   Socioeconomic History   Marital status: Widowed    Spouse name: Not on file   Number of children: Not on file   Years of education: Not on file   Highest education level: Not on file  Occupational History   Not on file  Tobacco Use   Smoking status: Former    Current packs/day: 0.00    Types: Cigarettes    Quit date: 11/01/1986    Years since quitting: 37.0   Smokeless tobacco: Former    Types: Chew    Quit date: 11/28/1987  Vaping Use   Vaping status: Never Used  Substance and Sexual Activity   Alcohol use: Yes    Comment: rare   Drug use: No   Sexual activity: Not Currently  Other Topics Concern   Not on file  Social History Narrative   Not on file  Social Drivers of Corporate investment banker Strain: Not on file  Food Insecurity: Not on file  Transportation Needs: Not on file  Physical Activity: Not on file  Stress: Not on file  Social Connections: Not on file  Intimate Partner Violence: Not on file    FAMILY HISTORY: Family History  Problem Relation Age of Onset   Healthy Mother    Healthy Father    Throat cancer Brother     ALLERGIES:  has no known allergies.  MEDICATIONS:  Current Outpatient Medications  Medication Sig Dispense Refill   acetaminophen (TYLENOL) 325 MG tablet Take 2 tablets (650 mg total) by mouth every 6 (six) hours as needed for mild pain (or Fever >/= 101). 20 tablet 0    albuterol (VENTOLIN HFA) 108 (90 Base) MCG/ACT inhaler Inhale 2 puffs into the lungs every 6 (six) hours as needed for wheezing or shortness of breath. (Patient not taking: Reported on 11/04/2022)     allopurinol (ZYLOPRIM) 300 MG tablet Take 1 tablet (300 mg total) by mouth daily. 30 tablet 5   ALPRAZolam (XANAX) 1 MG tablet Take 1 mg by mouth 3 (three) times daily as needed.     aspirin EC 81 MG tablet Take 1 tablet (81 mg total) by mouth daily. Swallow whole. Start after port a cath removal and discontinuation of your Eliquis. 30 tablet 12   feeding supplement (ENSURE ENLIVE / ENSURE PLUS) LIQD Take 237 mLs by mouth 2 (two) times daily between meals. 237 mL 12   HYDROcodone-acetaminophen (HYCET) 7.5-325 mg/15 ml solution Take 10 mLs by mouth 4 (four) times daily as needed for moderate pain. (Patient not taking: Reported on 07/03/2022) 120 mL 0   lidocaine (XYLOCAINE) 2 % solution Patient: Mix 1part 2% viscous lidocaine, 1part H20. Swallow 10mL of diluted mixture, before meals and at bedtime, up to QID (Patient not taking: Reported on 07/03/2022) 200 mL 2   lidocaine-prilocaine (EMLA) cream Apply 1 application topically as needed. 30-60 mins prior to port a cath access. Start using 2 weeks after port a cath placement. 30 g 0   Multiple Vitamin (ONE-A-DAY MENS PO) Take by mouth.     Omega-3 Fatty Acids (FISH OIL) 1200 MG CAPS Take 1 capsule by mouth daily.     ondansetron (ZOFRAN) 8 MG tablet Take 1 tablet (8 mg total) by mouth 2 (two) times daily as needed for refractory nausea / vomiting. Start on day 3 after cyclophosphamide chemotherapy. (Patient not taking: Reported on 07/03/2022) 30 tablet 1   sertraline (ZOLOFT) 50 MG tablet Take 50 mg by mouth daily.     sodium chloride flush (NS) 0.9 % SOLN 10-40 mLs by Intracatheter route as needed (flush). 1000 mL 0   Vitamin D, Ergocalciferol, 50000 units CAPS TAKE ONE CAPSULE BY MOUTH ONCE a WEEK 12 capsule 1   No current facility-administered  medications for this visit.    REVIEW OF SYSTEMS:    10 Point review of Systems was done is negative except as noted above.   PHYSICAL EXAMINATION: ECOG PERFORMANCE STATUS: 1 - Symptomatic but completely ambulatory VSS  GENERAL:alert, in no acute distress and comfortable SKIN: no acute rashes, no significant lesions EYES: conjunctiva are pink and non-injected, sclera anicteric OROPHARYNX: MMM, no exudates, no oropharyngeal erythema or ulceration NECK: supple, no JVD LYMPH:  no palpable lymphadenopathy in the cervical, axillary or inguinal regions LUNGS: clear to auscultation b/l with normal respiratory effort HEART: regular rate & rhythm ABDOMEN:  normoactive bowel sounds ,  non tender, not distended. Extremity: no pedal edema PSYCH: alert & oriented x 3 with fluent speech NEURO: no focal motor/sensory deficits   LABORATORY DATA:  I have reviewed the data as listed .    Latest Ref Rng & Units 07/11/2023    1:09 PM 03/07/2023   12:35 PM 11/04/2022    1:22 PM  CBC  WBC 4.0 - 10.5 K/uL 5.6  5.6  6.6   Hemoglobin 13.0 - 17.0 g/dL 82.9  56.2  13.0   Hematocrit 39.0 - 52.0 % 42.0  39.6  42.8   Platelets 150 - 400 K/uL 210  206  190     .    Latest Ref Rng & Units 07/11/2023    1:09 PM 03/07/2023   12:35 PM 11/04/2022    1:22 PM  CMP  Glucose 70 - 99 mg/dL 94  865  92   BUN 8 - 23 mg/dL 25  20  18    Creatinine 0.61 - 1.24 mg/dL 7.84  6.96  2.95   Sodium 135 - 145 mmol/L 139  140  139   Potassium 3.5 - 5.1 mmol/L 4.7  4.1  4.2   Chloride 98 - 111 mmol/L 103  107  104   CO2 22 - 32 mmol/L 29  27  30    Calcium 8.9 - 10.3 mg/dL 9.9  9.2  9.9   Total Protein 6.5 - 8.1 g/dL 7.8  7.1  7.3   Total Bilirubin 0.3 - 1.2 mg/dL 0.7  0.5  0.6   Alkaline Phos 38 - 126 U/L 37  43  63   AST 15 - 41 U/L 21  18  19    ALT 0 - 44 U/L 14  13  14      . Lab Results  Component Value Date   LDH 135 07/11/2023    SURGICAL PATHOLOGY  CASE: [id number]  PATIENT: Joe [name]  Surgical  Pathology Report   Clinical History: neck mass (cm)   FINAL MICROSCOPIC DIAGNOSIS:   A. SOFT TISSUE MASS, LEFT NECK, BIOPSY:  - Non-Hodgkin B-cell lymphoma, see comment.   COMMENT:   The majority of the fragments have extensive crush artifact hampering  evaluation. One fragment is well preserved and demonstrates an atypical  lymphoid proliferation with predominately small irregular lymphocytes.  Immunohistochemistry is positive for CD20, bcl-2, bcl-6 and CD10. CD23  is largely negative. CyclinD1 and light chain in situ hybridization are  negative. Ki-67 is 20-25%. CD3 and CD5 highlight scattered T-cells. Flow  cytometry ([id number]) reveals predominately T-cells. Overall, the  findings are consistent with a non-Hodgkin B-cell lymphoma, specifically  follicular lymphoma. There is only limited preserved tissue hampering  grading, but this area appears low grade (grade 1-2 of 3). A higher  grade process in the crushed fragments cannot be excluded. Clinical  correlation is recommended. Dr. Jenne Pane was attempted on 07/18/2021.   RADIOGRAPHIC STUDIES: I have personally reviewed the radiological images as listed and agreed with the findings in the report.  No results found.  ASSESSMENT & PLAN:   68 y.o.  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 Based on limited sampling the patient's pathology reveals a grade 1 through 2 follicular lymphoma with a Ki-67 of 25%.  There was a lot of crush artifact in the sample and therefore a high-grade process could not be ruled out. However the predominant element of his mass has grown significantly over the  last 1 month which is concerning for a high-grade follicular lymphoma or follicular lymphoma with transformation to large B-cell lymphoma.  CT neck imaging shows concern with compression of left internal jugular vein with mild mass-effect on the airway in the neck and  the left carotid is inseparable from the mass.  High risk of syncopal episodes if there is compression of the common carotid baroreceptors and left vagus nerve  PET/CT 08/10/2021 showed  1. Examination is positive for large FDG avid mass within the left neck extending from the level 2 nodal chain into the left side of the superior mediastinum compatible with metabolically active tumor. 2. There are 2 foci of increased uptake within the neck and body of pancreas lymphomatous involvement of the pancreas. 3. Subcentimeter FDG avid right inguinal lymph node also concerning for lymphoma. 4. Several foci of increased uptake within the axial skeleton compatible with malignancy  2) HTN  3) Obstructive sleep apnea not on CPAP machine.  This is causing sleep issues and possible cognitive issues.  4) Depression and anxiety  5) Acute pulmonary embolism due to lymphoma plus body habitus plus possible vascular compression - on lovenox  6) PICC line in situ  PLAN:  -Discussed lab results on 11/14/23 in detail with patient. CBC normal, showed WBC of 6.2K, hemoglobin of 14.4, and platelets of 233K. -CMP shows mild unchanged CKD -no lab, clinical or radiographic evidence of lymphoma recurrence/progression at this time. -No indication for additional chemoimmunotherapy at this time. -patient's last scans from 1 year ago showed stable findings -will hold off on additional imaging at this time -recommend patient to stay UTD with his age-appropriate vaccines, including flu, RSV, covid-19, pneumoinia, and shingles -his urinary and ear issues seen to be stable at this time -discussed that if there is any concern for urinary difficulty/enlarged prostate, there may be a role to be evaluated by a urologist.  -continue to follow up with his PCP, Dr. Margo Aye, regularly  -patient has reached the 2 year mark since his last treatment. Will plan to space out visits to every 6 months for the next year, then further extend to  annual visits.   FOLLOW-UP: RTC with Dr Candise Che with labs in 6 months  The total time spent in the appointment was *** minutes* .  All of the patient's questions were answered with apparent satisfaction. The patient knows to call the clinic with any problems, questions or concerns.   Wyvonnia Lora MD MS AAHIVMS [facility] Hematology/Oncology Physician [facility]  .*Total Encounter Time as defined by the Centers for Medicare and Medicaid Services includes, in addition to the face-to-face time of a patient visit (documented in the note above) non-face-to-face time: obtaining and reviewing outside history, ordering and reviewing medications, tests or procedures, care coordination (communications with other health care professionals or caregivers) and documentation in the medical record.    I,Mitra Faeizi,acting as a Neurosurgeon for Wyvonnia Lora, MD.,have documented all relevant documentation on the behalf of Wyvonnia Lora, MD,as directed by  Wyvonnia Lora, MD while in the presence of Wyvonnia Lora, MD.  ***

## 2023-11-15 ENCOUNTER — Telehealth: Payer: Self-pay | Admitting: Hematology

## 2023-11-15 NOTE — Telephone Encounter (Signed)
Spoke with patient confirming upcoming appointment

## 2023-11-20 ENCOUNTER — Encounter: Payer: Self-pay | Admitting: Hematology

## 2024-01-02 ENCOUNTER — Other Ambulatory Visit: Payer: Self-pay | Admitting: Hematology

## 2024-01-06 ENCOUNTER — Encounter: Payer: Self-pay | Admitting: Hematology

## 2024-04-13 ENCOUNTER — Emergency Department (HOSPITAL_COMMUNITY)
Admission: EM | Admit: 2024-04-13 | Discharge: 2024-04-13 | Disposition: A | Discharge status: Home / Self Care | Attending: Emergency Medicine | Admitting: Emergency Medicine

## 2024-04-13 ENCOUNTER — Encounter (HOSPITAL_COMMUNITY): Payer: Self-pay

## 2024-04-13 DIAGNOSIS — I10 Essential (primary) hypertension: Secondary | ICD-10-CM | POA: Insufficient documentation

## 2024-04-13 DIAGNOSIS — R21 Rash and other nonspecific skin eruption: Secondary | ICD-10-CM | POA: Diagnosis present

## 2024-04-13 DIAGNOSIS — B0230 Zoster ocular disease, unspecified: Secondary | ICD-10-CM | POA: Insufficient documentation

## 2024-04-13 DIAGNOSIS — Z7982 Long term (current) use of aspirin: Secondary | ICD-10-CM | POA: Diagnosis not present

## 2024-04-13 LAB — BASIC METABOLIC PANEL WITH GFR
Anion gap: 9 (ref 5–15)
BUN: 19 mg/dL (ref 8–23)
CO2: 27 mmol/L (ref 22–32)
Calcium: 8.8 mg/dL — ABNORMAL LOW (ref 8.9–10.3)
Chloride: 99 mmol/L (ref 98–111)
Creatinine, Ser: 1.57 mg/dL — ABNORMAL HIGH (ref 0.61–1.24)
GFR, Estimated: 49 mL/min — ABNORMAL LOW (ref >60)
Glucose, Bld: 105 mg/dL — ABNORMAL HIGH (ref 70–99)
Potassium: 4.2 mmol/L (ref 3.5–5.1)
Sodium: 135 mmol/L (ref 135–145)

## 2024-04-13 LAB — CBC WITH DIFFERENTIAL/PLATELET
Abs Immature Granulocytes: 0.02 10*3/uL (ref 0.00–0.07)
Basophils Absolute: 0.1 10*3/uL (ref 0.0–0.1)
Basophils Relative: 1 %
Eosinophils Absolute: 0.1 10*3/uL (ref 0.0–0.5)
Eosinophils Relative: 2 %
HCT: 41.1 % (ref 39.0–52.0)
Hemoglobin: 14.3 g/dL (ref 13.0–17.0)
Immature Granulocytes: 0 %
Lymphocytes Relative: 18 %
Lymphs Abs: 0.8 10*3/uL (ref 0.7–4.0)
MCH: 32.1 pg (ref 26.0–34.0)
MCHC: 34.8 g/dL (ref 30.0–36.0)
MCV: 92.2 fL (ref 80.0–100.0)
Monocytes Absolute: 0.6 10*3/uL (ref 0.1–1.0)
Monocytes Relative: 12 %
Neutro Abs: 3 10*3/uL (ref 1.7–7.7)
Neutrophils Relative %: 67 %
Platelets: 143 10*3/uL — ABNORMAL LOW (ref 150–400)
RBC: 4.46 MIL/uL (ref 4.22–5.81)
RDW: 14.2 % (ref 11.5–15.5)
WBC: 4.5 10*3/uL (ref 4.0–10.5)
nRBC: 0 % (ref 0.0–0.2)

## 2024-04-13 LAB — HEPATIC FUNCTION PANEL
ALT: 17 U/L (ref 0–44)
AST: 23 U/L (ref 15–41)
Albumin: 3.8 g/dL (ref 3.5–5.0)
Alkaline Phosphatase: 37 U/L — ABNORMAL LOW (ref 38–126)
Bilirubin, Direct: 0.3 mg/dL — ABNORMAL HIGH (ref 0.0–0.2)
Indirect Bilirubin: 0.8 mg/dL (ref 0.3–0.9)
Total Bilirubin: 1.1 mg/dL (ref 0.0–1.2)
Total Protein: 7.4 g/dL (ref 6.5–8.1)

## 2024-04-13 LAB — MAGNESIUM: Magnesium: 1.8 mg/dL (ref 1.7–2.4)

## 2024-04-13 MED ORDER — SODIUM CHLORIDE 0.9 % IV SOLN: Freq: Once | INTRAVENOUS | Status: DC

## 2024-04-13 MED ORDER — LACTATED RINGERS IV BOLUS
1000.0000 mL | Freq: Once | INTRAVENOUS | Status: AC
  Administered 2024-04-13: 1000 mL via INTRAVENOUS

## 2024-04-13 MED ORDER — LACTATED RINGERS IV BOLUS: 500.0000 mL | Freq: Once | INTRAVENOUS | Status: DC

## 2024-04-13 MED ORDER — HYDROMORPHONE HCL 1 MG/ML IJ SOLN
0.5000 mg | Freq: Once | INTRAMUSCULAR | Status: AC
  Administered 2024-04-13: 0.5 mg via INTRAVENOUS
  Filled 2024-04-13: qty 0.5

## 2024-04-13 MED ORDER — ERYTHROMYCIN 5 MG/GM OP OINT
TOPICAL_OINTMENT | Freq: Once | OPHTHALMIC | Status: AC
  Filled 2024-04-13: qty 3.5

## 2024-04-13 MED ORDER — DEXTROSE 5 % IV SOLN
10.0000 mg/kg | Freq: Three times a day (TID) | INTRAVENOUS | Status: DC
  Filled 2024-04-13 (×3): qty 19.5

## 2024-04-13 MED ORDER — VALACYCLOVIR HCL 1 G PO TABS: 1000.0000 mg | ORAL_TABLET | Freq: Three times a day (TID) | ORAL | 0 refills | Status: AC

## 2024-04-13 MED ORDER — VALACYCLOVIR HCL 500 MG PO TABS
1000.0000 mg | ORAL_TABLET | Freq: Once | ORAL | Status: AC
  Administered 2024-04-13: 1000 mg via ORAL
  Filled 2024-04-13: qty 2

## 2024-04-13 MED ORDER — KETOROLAC TROMETHAMINE 15 MG/ML IJ SOLN
15.0000 mg | Freq: Once | INTRAMUSCULAR | Status: AC
  Administered 2024-04-13: 15 mg via INTRAVENOUS
  Filled 2024-04-13: qty 1

## 2024-04-13 MED ORDER — ERYTHROMYCIN 5 MG/GM OP OINT: TOPICAL_OINTMENT | OPHTHALMIC | 0 refills | Status: AC

## 2024-04-13 MED ORDER — GABAPENTIN 100 MG PO CAPS: 100.0000 mg | ORAL_CAPSULE | Freq: Three times a day (TID) | ORAL | 0 refills | Status: AC

## 2024-04-13 MED ORDER — BACITRACIN ZINC 500 UNIT/GM EX OINT
1.0000 | TOPICAL_OINTMENT | Freq: Two times a day (BID) | CUTANEOUS | 0 refills | Status: AC
Start: 2024-04-13 — End: ?

## 2024-04-13 MED ORDER — GABAPENTIN 100 MG PO CAPS
100.0000 mg | ORAL_CAPSULE | Freq: Once | ORAL | Status: AC
  Administered 2024-04-13: 100 mg via ORAL
  Filled 2024-04-13: qty 1

## 2024-04-13 MED ORDER — SODIUM CHLORIDE 0.9 % IV SOLN: INTRAVENOUS | Status: DC

## 2024-04-13 MED ORDER — OXYCODONE-ACETAMINOPHEN 5-325 MG PO TABS: 1.0000 | ORAL_TABLET | Freq: Three times a day (TID) | ORAL | 0 refills | Status: AC | PRN

## 2024-04-13 NOTE — ED Triage Notes (Signed)
 Pt arrived via POV from Urgent Care for evaluation of active Shingles Infection to right side of his face and eye. Pt reports first noticing the rash on Sunday.

## 2024-04-13 NOTE — ED Notes (Signed)
 Discharge instructions and prescriptions reviewed with patient and patients spouse. Follow up appointment reviewed with patient and patients spouse. Both verbalizes understanding of discharge instructions and home care. Patient ambulatory out of department at this time

## 2024-04-13 NOTE — Discharge Instructions (Addendum)
 A prescription for medication called valacyclovir was sent to your pharmacy.  This is an antiviral medication that will help limit the severity and duration of your shingles.  Take 3 times per day for the next 7 days.  The area of your rash involves your eye.  You should see the eye doctor tomorrow.  He is expecting you at 8 AM.  Call the office prior to arrival to inform them that you are coming.  For pain: -Take over-the-counter ibuprofen, 600 mg, every 6 hours.   -Take over-the-counter Tylenol , 650mg , every 6 hours.   -A prescription for medication called gabapentin was sent to your pharmacy.  This is not habit-forming.  Take 3 times a day as prescribed.   -A prescription for medication called Percocet was sent to your pharmacy.  This is a narcotic and can be addictive.  Take this only as needed.  Prescriptions for eye ointment and skin ointment were also sent to your pharmacy.  Ensure that you do not get these mixed up.   -Erythromycin ointment can be applied to your lower eyelid of right eye 2 times per day as needed for comfort. - Bacitracin  can be applied to open lesions on your skin 2 times per day.  Return to the emergency department for any new or worsening symptoms of concern.

## 2024-04-13 NOTE — ED Provider Notes (Signed)
 [place] EMERGENCY DEPARTMENT AT [facility] Provider Note   CSN: [id number] Arrival date & time: 04/13/24  1719     Patient presents with: Herpes Zoster (Shingles right face)   Joe [name] is a 68 y.o. male.   HPI Patient presents with painful rash on face.  Medical history includes HTN, HLD, OSA, depression, VTE, anxiety, arthritis, lymphoma.  He completed his cancer treatment 2-1/2 years ago.  He is not currently on any immunosuppressant medications.  He is not prescribed a blood thinner at this time.  Yesterday, patient developed a painful rash to his face.  This is worsened today.  He was seen urgent care prior to arrival.  He was sent to the ED for further evaluation.  Patient reports right eye pain with increased lacrimation.  He denies any blurriness of vision.    Prior to Admission medications   Medication Sig Start Date End Date Taking? Authorizing Provider  bacitracin  ointment Apply 1 Application topically 2 (two) times daily. to areas of skin lesions.  Do not apply to eye. 04/13/24  Yes Melvenia Motto, MD  erythromycin ophthalmic ointment Place a 1/2 inch ribbon of ointment into the lower eyelid, twice a day as needed for comfort. 04/13/24  Yes Melvenia Motto, MD  gabapentin (NEURONTIN) 100 MG capsule Take 1 capsule (100 mg total) by mouth 3 (three) times daily for 10 days. 04/13/24 04/23/24 Yes Melvenia Motto, MD  oxyCODONE -acetaminophen  (PERCOCET/ROXICET) 5-325 MG tablet Take 1 tablet by mouth every 8 (eight) hours as needed for up to 5 days for severe pain (pain score 7-10). 04/13/24 04/18/24 Yes Melvenia Motto, MD  valACYclovir (VALTREX) 1000 MG tablet Take 1 tablet (1,000 mg total) by mouth 3 (three) times daily for 7 days. 04/13/24 04/20/24 Yes Melvenia Motto, MD  acetaminophen  (TYLENOL ) 325 MG tablet Take 2 tablets (650 mg total) by mouth every 6 (six) hours as needed for mild pain (or Fever >/= 101). 08/25/21   Sherrill Cable Latif, DO  albuterol  (VENTOLIN  HFA) 108 (90 Base)  MCG/ACT inhaler Inhale 2 puffs into the lungs every 6 (six) hours as needed for wheezing or shortness of breath. Patient not taking: Reported on 11/04/2022    [provider]  allopurinol  (ZYLOPRIM ) 100 MG tablet Take 100 mg by mouth daily. 04/12/24   [provider]  ALPRAZolam SHEFFIELD) 1 MG tablet Take 1 mg by mouth 3 (three) times daily as needed. 08/30/21   [provider]  aspirin  EC 81 MG tablet Take 1 tablet (81 mg total) by mouth daily. Swallow whole. Start after port a cath removal and discontinuation of your Eliquis . 11/04/22   Onesimo Emaline Brink, MD  feeding supplement (ENSURE ENLIVE / ENSURE PLUS) LIQD Take 237 mLs by mouth 2 (two) times daily between meals. 08/25/21   Sheikh, Omair Latif, DO  fenofibrate 160 MG tablet Take 160 mg by mouth daily. 04/12/24   [provider]  HYDROcodone -acetaminophen  (HYCET) 7.5-325 mg/15 ml solution Take 10 mLs by mouth 4 (four) times daily as needed for moderate pain. Patient not taking: Reported on 07/03/2022 02/07/22   Shannon Agent, MD  lidocaine  (XYLOCAINE ) 2 % solution Patient: Mix 1part 2% viscous lidocaine , 1part H20. Swallow 10mL of diluted mixture, before meals and at bedtime, up to QID Patient not taking: Reported on 07/03/2022 02/04/22   Izell Domino, MD  lidocaine -prilocaine  (EMLA ) cream Apply 1 application topically as needed. 30-60 mins prior to port a cath access. Start using 2 weeks after port a cath placement.  10/18/21   Onesimo Emaline Brink, MD  Multiple Vitamin (ONE-A-DAY MENS PO) Take by mouth.    [provider]  Omega-3 Fatty Acids (FISH OIL) 1200 MG CAPS Take 1 capsule by mouth daily.    [provider]  sertraline  (ZOLOFT ) 50 MG tablet Take 50 mg by mouth daily.    [provider]  sodium chloride  flush (NS) 0.9 % SOLN 10-40 mLs by Intracatheter route as needed (flush). 08/25/21   Sheikh, Omair Latif, DO  Vitamin D , Ergocalciferol , (DRISDOL ) 1.25 MG (50000 UNIT) CAPS  capsule TAKE ONE CAPSULE BY MOUTH ONCE a WEEK 01/02/24   Kale, Gautam Kishore, MD    Allergies: Patient has no known allergies.    Review of Systems  HENT:  Positive for rhinorrhea.   Eyes:  Positive for pain and redness.  Skin:  Positive for rash.  All other systems reviewed and are negative.   Updated Vital Signs BP 122/75 (BP Location: Right Arm)   Pulse 80   Temp 98.5 F (36.9 C) (Oral)   Resp 16   Ht 5' 9 (1.753 m)   Wt (!) 137.4 kg   SpO2 96%   BMI 44.75 kg/m   Physical Exam Vitals and nursing note reviewed.  Constitutional:      General: He is not in acute distress.    Appearance: Normal appearance. He is well-developed. He is not toxic-appearing or diaphoretic.  HENT:     Head: Normocephalic.     Comments: Shingles rash present in V1/V2 distribution on right side.    Right Ear: Tympanic membrane, ear canal and external ear normal.     Left Ear: External ear normal.     Nose:     Comments: Positive Hutchinson sign    Mouth/Throat:     Mouth: Mucous membranes are moist.   Eyes:     Extraocular Movements: Extraocular movements intact.     Conjunctiva/sclera: Conjunctivae normal.    Cardiovascular:     Rate and Rhythm: Normal rate and regular rhythm.  Pulmonary:     Effort: Pulmonary effort is normal. No respiratory distress.  Abdominal:     General: There is no distension.     Palpations: Abdomen is soft.   Musculoskeletal:        General: No swelling. Normal range of motion.     Cervical back: Normal range of motion and neck supple.   Skin:    General: Skin is warm and dry.     Coloration: Skin is not jaundiced or pale.     Findings: Rash present.   Neurological:     General: No focal deficit present.     Mental Status: He is alert and oriented to person, place, and time.   Psychiatric:        Mood and Affect: Mood normal.        Behavior: Behavior normal.     (all labs ordered are listed, but only abnormal results are displayed) Labs  Reviewed  CBC WITH DIFFERENTIAL/PLATELET - Abnormal; Notable for the following components:      Result Value   Platelets 143 (*)    All other components within normal limits  BASIC METABOLIC PANEL WITH GFR - Abnormal; Notable for the following components:   Glucose, Bld 105 (*)    Creatinine, Ser 1.57 (*)    Calcium 8.8 (*)    GFR, Estimated 49 (*)    All other components within normal limits  HEPATIC FUNCTION PANEL - Abnormal; Notable for the  following components:   Alkaline Phosphatase 37 (*)    Bilirubin, Direct 0.3 (*)    All other components within normal limits  MAGNESIUM    EKG: None  Radiology: No results found.   Procedures   Medications Ordered in the ED  ketorolac  (TORADOL ) 15 MG/ML injection 15 mg (has no administration in time range)  HYDROmorphone  (DILAUDID ) injection 0.5 mg (0.5 mg Intravenous Given 04/13/24 1820)  gabapentin (NEURONTIN) capsule 100 mg (100 mg Oral Given 04/13/24 1820)  valACYclovir (VALTREX) tablet 1,000 mg (1,000 mg Oral Given 04/13/24 1820)  lactated ringers  bolus 1,000 mL (1,000 mLs Intravenous Bolus 04/13/24 1820)  erythromycin ophthalmic ointment ( Right Eye Given 04/13/24 1820)                                    Medical Decision Making Amount and/or Complexity of Data Reviewed Labs: ordered.  Risk Prescription drug management.   This patient presents to the ED for concern of rash, this involves an extensive number of treatment options, and is a complaint that carries with it a high risk of complications and morbidity.  The differential diagnosis includes herpes zoster, zoster ophthalmicus, contact dermatitis, other allergic reaction   Co morbidities / Chronic conditions that complicate the patient evaluation  HTN, HLD, OSA, depression, VTE, anxiety, arthritis, lymphoma   Additional history obtained:  Additional history obtained from EMR External records from outside source obtained and reviewed including patient's  family   Lab Tests:  I Ordered, and personally interpreted labs.  The pertinent results include: Baseline creatinine, normal electrolytes, normal hemoglobin, no leukocytosis  Cardiac Monitoring: / EKG:  The patient was maintained on a cardiac monitor.  I personally viewed and interpreted the cardiac monitored which showed an underlying rhythm of: Sinus rhythm   Problem List / ED Course / Critical interventions / Medication management  Patient presenting for painful rash to the right side of face since yesterday.  On arrival, vesicular rash is noted to V1/V2 distribution of right side of face.  He does have a positive Hutchinson sign suggestive of herpes zoster ophthalmicus.  He has had painful irritation of his right eye.  He denies any recent vision changes.  Patient is otherwise well-appearing.  He rates his current pain as 10/10 in severity.  Dilaudid  and gabapentin were ordered.  Will initiate antiviral.  Ophthalmology was consulted.  I spoke with ophthalmologist on-call, Dr. Maree, who does not recommend any eyedrops at this time.  He did advise that patient can get erythromycin ointment for comfort.  He would like to see the patient in his office tomorrow morning.  Patient's lab work is unremarkable.  On reassessment, he has improved pain following gabapentin and Dilaudid .  He states that he does not currently need any other pain medication.  Will give Toradol  given the time of night for ongoing analgesia.  Patient was prescribed ongoing valacyclovir and multimodal pain control.  He was discharged in stable condition. I ordered medication including Dilaudid , gabapentin, Toradol  for analgesia; IV fluids for hydration; valacyclovir for herpes zoster Reevaluation of the patient after these medicines showed that the patient improved I have reviewed the patients home medicines and have made adjustments as needed   Consultations Obtained:  I requested consultation with the ophthalmologist, Dr.  Maree,  and discussed lab and imaging findings as well as pertinent plan - they recommend: Valacyclovir, erythromycin ointment for comfort, outpatient follow-up tomorrow  Social Determinants of Health:  Lives independently     Final diagnoses:  Herpes zoster with ophthalmic complication, unspecified herpes zoster eye disease    ED Discharge Orders          Ordered    gabapentin (NEURONTIN) 100 MG capsule  3 times daily        04/13/24 1939    erythromycin ophthalmic ointment        04/13/24 1939    valACYclovir (VALTREX) 1000 MG tablet  3 times daily        04/13/24 1939    oxyCODONE -acetaminophen  (PERCOCET/ROXICET) 5-325 MG tablet  Every 8 hours PRN        04/13/24 1939    bacitracin  ointment  2 times daily        04/13/24 1939               Melvenia Motto, MD 04/13/24 1941

## 2024-04-26 ENCOUNTER — Telehealth: Payer: Self-pay | Admitting: Hematology

## 2024-04-26 NOTE — Telephone Encounter (Signed)
 Left patient a vm regarding upcoming appointment

## 2024-05-05 ENCOUNTER — Other Ambulatory Visit: Payer: Self-pay

## 2024-05-05 DIAGNOSIS — C8221 Follicular lymphoma grade III, unspecified, lymph nodes of head, face, and neck: Secondary | ICD-10-CM

## 2024-05-06 ENCOUNTER — Ambulatory Visit: Admitting: Hematology

## 2024-05-06 ENCOUNTER — Other Ambulatory Visit

## 2024-05-14 ENCOUNTER — Other Ambulatory Visit: Payer: Medicare Other

## 2024-05-14 ENCOUNTER — Ambulatory Visit: Payer: Medicare Other | Admitting: Hematology

## 2024-05-25 ENCOUNTER — Other Ambulatory Visit

## 2024-05-25 ENCOUNTER — Telehealth: Payer: Self-pay | Admitting: Hematology

## 2024-05-25 ENCOUNTER — Ambulatory Visit: Admitting: Hematology

## 2024-06-11 ENCOUNTER — Encounter: Payer: Self-pay | Admitting: Radiology

## 2024-06-28 ENCOUNTER — Other Ambulatory Visit: Payer: Self-pay | Admitting: Hematology

## 2024-07-14 ENCOUNTER — Inpatient Hospital Stay: Admitting: Hematology

## 2024-07-14 ENCOUNTER — Inpatient Hospital Stay

## 2024-08-06 ENCOUNTER — Encounter: Payer: Self-pay | Admitting: Hematology

## 2024-08-13 ENCOUNTER — Inpatient Hospital Stay: Attending: Hematology

## 2024-08-13 ENCOUNTER — Inpatient Hospital Stay: Admitting: Hematology

## 2024-08-13 VITALS — BP 124/87 | HR 96 | Temp 97.6°F | Resp 17 | Ht 69.0 in | Wt 274.0 lb

## 2024-08-13 DIAGNOSIS — Z7901 Long term (current) use of anticoagulants: Secondary | ICD-10-CM | POA: Diagnosis not present

## 2024-08-13 DIAGNOSIS — C8221 Follicular lymphoma grade III, unspecified, lymph nodes of head, face, and neck: Secondary | ICD-10-CM | POA: Diagnosis not present

## 2024-08-13 DIAGNOSIS — Z79899 Other long term (current) drug therapy: Secondary | ICD-10-CM | POA: Diagnosis not present

## 2024-08-13 DIAGNOSIS — Z7982 Long term (current) use of aspirin: Secondary | ICD-10-CM | POA: Insufficient documentation

## 2024-08-13 DIAGNOSIS — I129 Hypertensive chronic kidney disease with stage 1 through stage 4 chronic kidney disease, or unspecified chronic kidney disease: Secondary | ICD-10-CM | POA: Diagnosis not present

## 2024-08-13 DIAGNOSIS — N189 Chronic kidney disease, unspecified: Secondary | ICD-10-CM | POA: Diagnosis not present

## 2024-08-13 DIAGNOSIS — Z86718 Personal history of other venous thrombosis and embolism: Secondary | ICD-10-CM | POA: Diagnosis not present

## 2024-08-13 DIAGNOSIS — C8291 Follicular lymphoma, unspecified, lymph nodes of head, face, and neck: Secondary | ICD-10-CM | POA: Insufficient documentation

## 2024-08-13 DIAGNOSIS — F418 Other specified anxiety disorders: Secondary | ICD-10-CM | POA: Diagnosis not present

## 2024-08-13 DIAGNOSIS — Z86711 Personal history of pulmonary embolism: Secondary | ICD-10-CM | POA: Diagnosis not present

## 2024-08-13 DIAGNOSIS — Z87891 Personal history of nicotine dependence: Secondary | ICD-10-CM | POA: Diagnosis not present

## 2024-08-13 LAB — CMP (CANCER CENTER ONLY)
ALT: 15 U/L (ref 0–44)
AST: 24 U/L (ref 15–41)
Albumin: 4.2 g/dL (ref 3.5–5.0)
Alkaline Phosphatase: 43 U/L (ref 38–126)
Anion gap: 6 (ref 5–15)
BUN: 20 mg/dL (ref 8–23)
CO2: 27 mmol/L (ref 22–32)
Calcium: 9.3 mg/dL (ref 8.9–10.3)
Chloride: 107 mmol/L (ref 98–111)
Creatinine: 1.49 mg/dL — ABNORMAL HIGH (ref 0.61–1.24)
GFR, Estimated: 51 mL/min — ABNORMAL LOW (ref >60)
Glucose, Bld: 98 mg/dL (ref 70–99)
Potassium: 4.3 mmol/L (ref 3.5–5.1)
Sodium: 140 mmol/L (ref 135–145)
Total Bilirubin: 0.5 mg/dL (ref 0.0–1.2)
Total Protein: 7.2 g/dL (ref 6.5–8.1)

## 2024-08-13 LAB — CBC WITH DIFFERENTIAL (CANCER CENTER ONLY)
Abs Immature Granulocytes: 0.02 K/uL (ref 0.00–0.07)
Basophils Absolute: 0.1 K/uL (ref 0.0–0.1)
Basophils Relative: 1 %
Eosinophils Absolute: 0.2 K/uL (ref 0.0–0.5)
Eosinophils Relative: 3 %
HCT: 40.5 % (ref 39.0–52.0)
Hemoglobin: 14.2 g/dL (ref 13.0–17.0)
Immature Granulocytes: 0 %
Lymphocytes Relative: 23 %
Lymphs Abs: 1.7 K/uL (ref 0.7–4.0)
MCH: 31.4 pg (ref 26.0–34.0)
MCHC: 35.1 g/dL (ref 30.0–36.0)
MCV: 89.6 fL (ref 80.0–100.0)
Monocytes Absolute: 0.6 K/uL (ref 0.1–1.0)
Monocytes Relative: 9 %
Neutro Abs: 4.7 K/uL (ref 1.7–7.7)
Neutrophils Relative %: 64 %
Platelet Count: 250 K/uL (ref 150–400)
RBC: 4.52 MIL/uL (ref 4.22–5.81)
RDW: 14 % (ref 11.5–15.5)
WBC Count: 7.3 K/uL (ref 4.0–10.5)
nRBC: 0 % (ref 0.0–0.2)

## 2024-08-13 LAB — LACTATE DEHYDROGENASE: LDH: 133 U/L (ref 98–192)

## 2024-08-13 NOTE — Progress Notes (Signed)
 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  SUMMARY OF ONCOLOGIC HISTORY: Oncology History  Follicular lymphoma (HCC)  08/01/2021 Initial Diagnosis   Follicular lymphoma (HCC)   08/17/2021 - 12/01/2021 Chemotherapy   Patient is on Treatment Plan : NON-HODGKINS LYMPHOMA R-CHOP q21d     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 is ambulating with a cane.  he was last seen by me on 11/14/2023; at the time he mentioned experiencing an episode of leg swelling after long-distance travel.   Today, he says that he has, for the most part, recovered from the Shingles outbreak at the end of August that affected his forehead, but is still experiencing some sensitivity and shocking pain several times daily, but mainly later in the evening. He is still taking Gabapentin  twice daily, and was offered Percocet for pain management, but he'd prefer to avoid narcotics. No Eye involvement. States that he will receive Shingrix as soon as he can. Will also receive PNA vaccine.  Does endorse some constipation due to Gabapentin .   Denies any fevers/chills,  drenching night sweats, chest pain, SOB, abdominal pain, new lumps/bumps. Endorses back pain (some this morning attributed to sleeping wrong) and some leg swelling. Swelling occurs at the end of June following long-distance travel, which did improve after elevation. He says that he tends to follow a low-sodium diet, but is not on diuretics.    Regarding the chronic bump, he states that this has been stable.   REVIEW OF SYSTEMS:    10 Point review of systems of done and is negative except as noted above.  MEDICAL HISTORY Past Medical History:  Diagnosis Date   Anxiety    Arthritis    COVID    x 2 (2020 and 2022)   Depression    Follicular lymphoma (HCC)    Hypertension    Left leg DVT (HCC) 07/2021   Pulmonary embolism (HCC) 07/2021    SURGICAL HISTORY Past Surgical History:  Procedure Laterality Date   carpel tunnel     IR IMAGING GUIDED PORT INSERTION  10/10/2021   IR REMOVAL TUN ACCESS W/ PORT W/O FL MOD SED  11/26/2022   MASS BIOPSY Left 07/14/2021   Procedure: NECK MASS BIOPSY;  Surgeon: Carlie Clark, MD;  Location: [facility] OR;  Service: ENT;  Laterality: Left;    SOCIAL HISTORY Social History   Tobacco Use   Smoking status: Former    Current packs/day: 0.00    Types: Cigarettes    Quit date: 11/01/1986    Years since quitting: 37.8   Smokeless tobacco: Former    Types: Chew    Quit date: 11/28/1987  Vaping Use   Vaping status: Never Used  Substance Use Topics  Alcohol use: Yes    Comment: rare   Drug use: No    Social History   Social History Narrative   Not on file    SOCIAL DRIVERS OF HEALTH SDOH Screenings   Food Insecurity: No Food Insecurity (08/13/2024)  Housing: Low Risk  (08/13/2024)  Transportation Needs: No Transportation Needs (08/13/2024)  Utilities: Not At Risk (08/13/2024)  Depression (PHQ2-9): Low Risk  (08/13/2024)  Tobacco Use: Medium Risk (04/13/2024)     FAMILY HISTORY Family History  Problem Relation Age of Onset   Healthy  Mother    Healthy Father    Throat cancer Brother   Mother with some dementia   ALLERGIES: has no known allergies.  MEDICATIONS  Current Outpatient Medications  Medication Sig Dispense Refill   acetaminophen  (TYLENOL ) 325 MG tablet Take 2 tablets (650 mg total) by mouth every 6 (six) hours as needed for mild pain (or Fever >/= 101). 20 tablet 0   albuterol  (VENTOLIN  HFA) 108 (90 Base) MCG/ACT inhaler Inhale 2 puffs into the lungs every 6 (six) hours as needed for wheezing or shortness of breath. (Patient not taking: Reported on 11/04/2022)     allopurinol  (ZYLOPRIM ) 100 MG tablet Take 100 mg by mouth daily.     ALPRAZolam (XANAX) 1 MG tablet Take 1 mg by mouth 3 (three) times daily as needed.     aspirin  EC 81 MG tablet Take 1 tablet (81 mg total) by mouth daily. Swallow whole. Start after port a cath removal and discontinuation of your Eliquis . 30 tablet 12   bacitracin  ointment Apply 1 Application topically 2 (two) times daily. to areas of skin lesions.  Do not apply to eye. 120 g 0   erythromycin  ophthalmic ointment Place a 1/2 inch ribbon of ointment into the lower eyelid, twice a day as needed for comfort. 3.5 g 0   feeding supplement (ENSURE ENLIVE / ENSURE PLUS) LIQD Take 237 mLs by mouth 2 (two) times daily between meals. 237 mL 12   fenofibrate 160 MG tablet Take 160 mg by mouth daily.     gabapentin  (NEURONTIN ) 100 MG capsule Take 1 capsule (100 mg total) by mouth 3 (three) times daily for 10 days. 30 capsule 0   HYDROcodone -acetaminophen  (HYCET) 7.5-325 mg/15 ml solution Take 10 mLs by mouth 4 (four) times daily as needed for moderate pain. (Patient not taking: Reported on 07/03/2022) 120 mL 0   lidocaine  (XYLOCAINE ) 2 % solution Patient: Mix 1part 2% viscous lidocaine , 1part H20. Swallow 10mL of diluted mixture, before meals and at bedtime, up to QID (Patient not taking: Reported on 07/03/2022) 200 mL 2   lidocaine -prilocaine  (EMLA ) cream Apply 1 application topically as  needed. 30-60 mins prior to port a cath access. Start using 2 weeks after port a cath placement. 30 g 0   Multiple Vitamin (ONE-A-DAY MENS PO) Take by mouth.     Omega-3 Fatty Acids (FISH OIL) 1200 MG CAPS Take 1 capsule by mouth daily.     sertraline  (ZOLOFT ) 50 MG tablet Take 50 mg by mouth daily.     sodium chloride  flush (NS) 0.9 % SOLN 10-40 mLs by Intracatheter route as needed (flush). 1000 mL 0   Vitamin D , Ergocalciferol , (DRISDOL ) 1.25 MG (50000 UNIT) CAPS capsule TAKE ONE CAPSULE BY MOUTH ONCE a WEEK 12 capsule 1   No current facility-administered medications for this visit.    PHYSICAL EXAMINATION: ECOG PERFORMANCE STATUS: 1 - Symptomatic but completely ambulatory VITALS: Vitals:   08/13/24 1247  BP:  124/87  Pulse: 96  Resp: 17  Temp: 97.6 F (36.4 C)  SpO2: 95%   Filed Weights   08/13/24 1247  Weight: 274 lb (124.3 kg)   Body mass index is 40.46 kg/m.  GENERAL: alert, in no acute distress and comfortable SKIN: no acute rashes, no significant lesions EYES: conjunctiva are pink and non-injected, sclera anicteric OROPHARYNX: MMM, no exudates, no oropharyngeal erythema or ulceration NECK: supple, no JVD LYMPH:  no palpable lymphadenopathy in the cervical, axillary or inguinal regions, (-) stable bump  LUNGS: clear to auscultation b/l with normal respiratory effort HEART: regular rate & rhythm ABDOMEN:  normoactive bowel sounds , non tender, not distended, no hepatosplenomegaly Extremity: no pedal edema PSYCH: alert & oriented x 3 with fluent speech NEURO: no focal motor/sensory deficits   LABORATORY DATA:   I have reviewed the data as listed     Latest Ref Rng & Units 08/13/2024   12:31 PM 04/13/2024    6:19 PM 11/14/2023   10:21 AM  CBC EXTENDED  WBC 4.0 - 10.5 K/uL 7.3  4.5  6.2   RBC 4.22 - 5.81 MIL/uL 4.52  4.46  4.58   Hemoglobin 13.0 - 17.0 g/dL 85.7  85.6  85.5   HCT 39.0 - 52.0 % 40.5  41.1  42.6   Platelets 150 - 400 K/uL 250  143  233   NEUT#  1.7 - 7.7 K/uL 4.7  3.0  3.7   Lymph# 0.7 - 4.0 K/uL 1.7  0.8  1.7     LDH:  Lab Results  Component Value Date   LDH 133 08/13/2024        Latest Ref Rng & Units 08/13/2024   12:31 PM 04/13/2024    6:19 PM 11/14/2023   10:21 AM  CMP  Glucose 70 - 99 mg/dL 98  894  94   BUN 8 - 23 mg/dL 20  19  23    Creatinine 0.61 - 1.24 mg/dL 8.50  8.42  8.55   Sodium 135 - 145 mmol/L 140  135  142   Potassium 3.5 - 5.1 mmol/L 4.3  4.2  4.3   Chloride 98 - 111 mmol/L 107  99  107   CO2 22 - 32 mmol/L 27  27  29    Calcium 8.9 - 10.3 mg/dL 9.3  8.8  89.7   Total Protein 6.5 - 8.1 g/dL 7.2  7.4  7.4   Total Bilirubin 0.0 - 1.2 mg/dL 0.5  1.1  0.4   Alkaline Phos 38 - 126 U/L 43  37  46   AST 15 - 41 U/L 24  23  19    ALT 0 - 44 U/L 15  17  14     SURGICAL PATHOLOGY  CASE: [id number]  PATIENT: Jovonte Wemhoff  Surgical Pathology Report   Clinical History: neck mass (cm)   FINAL MICROSCOPIC DIAGNOSIS:   A. SOFT TISSUE MASS, LEFT NECK, BIOPSY:  - Non-Hodgkin B-cell lymphoma, see comment.   COMMENT:   The majority of the fragments have extensive crush artifact hampering  evaluation. One fragment is well preserved and demonstrates an atypical  lymphoid proliferation with predominately small irregular lymphocytes.  Immunohistochemistry is positive for CD20, bcl-2, bcl-6 and CD10. CD23  is largely negative. CyclinD1 and light chain in situ hybridization are  negative. Ki-67 is 20-25%. CD3 and CD5 highlight scattered T-cells. Flow  cytometry ([id number]) reveals predominately T-cells. Overall, the  findings are consistent with a non-Hodgkin B-cell lymphoma, specifically  follicular lymphoma. There  is only limited preserved tissue hampering  grading, but this area appears low grade (grade 1-2 of 3). A higher  grade process in the crushed fragments cannot be excluded. Clinical  correlation is recommended. Dr. Carlie was attempted on 07/18/2021.    RADIOGRAPHIC STUDIES: I have personally  reviewed the radiological images as listed and agreed with the findings in the report. No results found.    ASSESSMENT & PLAN:    68 y.o.  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 Based on limited sampling the patient's pathology reveals a grade 1 through 2 follicular lymphoma with a Ki-67 of 25%.  There was a lot of crush artifact in the sample and therefore a high-grade process could not be ruled out. However the predominant element of his mass has grown significantly over the last 1 month which is concerning for a high-grade follicular lymphoma or follicular lymphoma with transformation to large B-cell lymphoma.   CT neck imaging shows concern with compression of left internal jugular vein with mild mass-effect on the airway in the neck and the left carotid is inseparable from the mass.   High risk of syncopal episodes if there is compression of the common carotid baroreceptors and left vagus nerve   PET/CT 08/10/2021 showed  1. Examination is positive for large FDG avid mass within the left neck extending from the level 2 nodal chain into the left side of the superior mediastinum compatible with metabolically active tumor. 2. There are 2 foci of increased uptake within the neck and body of pancreas lymphomatous involvement of the pancreas. 3. Subcentimeter FDG avid right inguinal lymph node also concerning for lymphoma. 4. Several foci of increased uptake within the axial skeleton compatible with malignancy   2) HTN   3) Obstructive sleep apnea not on CPAP machine.  This is causing sleep issues and possible cognitive issues.   4) Depression and anxiety   5) Acute pulmonary embolism due to lymphoma plus body habitus plus possible vascular compression - on lovenox    6) PICC line in situ    PLAN: - Discussed lab results on 08/13/2024 in detail with patient: CBC stable. CMP with Creatinine 1.49  decreased from 1.57. CKD stable.  LDH normal at 133. No lab or clnical findings to suggest NHL recurrence/progression at this itme - Can use compression to prevent leg swelling.  -hx of recent shingles with post hepretic neuralgia. Rec gettgin Shingles vaccinations 3-4 months s/p shingles infection.  FOLLOW-UP in 6 months for labs and follow-up with Dr. Onesimo.  The total time spent in the appointment was 21 minutes* .  All of the patient's questions were answered and the patient knows to call the clinic with any problems, questions, or concerns.  Emaline Onesimo MD MS AAHIVMS [facility] Hematology/Oncology Physician [facility]  *Total Encounter Time as defined by the Centers for Medicare and Medicaid Services includes, in addition to the face-to-face time of a patient visit (documented in the note above) non-face-to-face time: obtaining and reviewing outside history, ordering and reviewing medications, tests or procedures, care coordination (communications with other health care professionals or caregivers) and documentation in the medical record.  I,Emily Lagle,acting as a neurosurgeon for Emaline Onesimo, MD.,have documented all relevant documentation on the behalf of Emaline Onesimo, MD,as directed by  Emaline Onesimo, MD while in the presence of Emaline Onesimo, MD.  I have reviewed the above documentation for accuracy and  completeness, and I agree with the above.  Lujean Ebright, MD

## 2024-08-20 ENCOUNTER — Encounter: Payer: Self-pay | Admitting: Hematology

## 2024-08-23 ENCOUNTER — Encounter: Payer: Self-pay | Admitting: Radiology

## 2025-01-28 ENCOUNTER — Inpatient Hospital Stay: Admitting: Hematology

## 2025-01-28 ENCOUNTER — Inpatient Hospital Stay
